# Patient Record
Sex: Male | Born: 1940 | Race: White | Hispanic: No | Smoking: Former smoker
Health system: Southern US, Community
[De-identification: ages and names within clinical notes are randomized; demographics above are authoritative.]

## PROBLEM LIST (undated history)

## (undated) DIAGNOSIS — E039 Hypothyroidism, unspecified: Secondary | ICD-10-CM

## (undated) DIAGNOSIS — R5383 Other fatigue: Secondary | ICD-10-CM

## (undated) DIAGNOSIS — E785 Hyperlipidemia, unspecified: Secondary | ICD-10-CM

## (undated) DIAGNOSIS — F32A Depression, unspecified: Secondary | ICD-10-CM

## (undated) DIAGNOSIS — F329 Major depressive disorder, single episode, unspecified: Secondary | ICD-10-CM

## (undated) DIAGNOSIS — K769 Liver disease, unspecified: Secondary | ICD-10-CM

## (undated) DIAGNOSIS — I10 Essential (primary) hypertension: Principal | ICD-10-CM

## (undated) DIAGNOSIS — E079 Disorder of thyroid, unspecified: Secondary | ICD-10-CM

## (undated) DIAGNOSIS — E46 Unspecified protein-calorie malnutrition: Secondary | ICD-10-CM

## (undated) DIAGNOSIS — C8588 Other specified types of non-Hodgkin lymphoma, lymph nodes of multiple sites: Secondary | ICD-10-CM

## (undated) DIAGNOSIS — M109 Gout, unspecified: Principal | ICD-10-CM

## (undated) HISTORY — DX: Disorder of thyroid, unspecified: E07.9

## (undated) HISTORY — PX: BACK SURGERY: SHX140

## (undated) HISTORY — DX: Hyperlipidemia, unspecified: E78.5

## (undated) HISTORY — DX: Other fatigue: R53.83

## (undated) HISTORY — DX: Other specified types of non-hodgkin lymphoma, lymph nodes of multiple sites: C85.88

## (undated) HISTORY — DX: Gout, unspecified: M10.9

## (undated) HISTORY — DX: Essential (primary) hypertension: I10

## (undated) HISTORY — DX: Liver disease, unspecified: K76.9

## (undated) HISTORY — DX: Unspecified protein-calorie malnutrition: E46

---

## 2013-05-03 DEATH — deceased

## 2013-08-29 ENCOUNTER — Telehealth: Payer: Self-pay | Admitting: Hematology and Oncology

## 2013-08-29 NOTE — Telephone Encounter (Signed)
C/D 08/29/13 for appt. 08/30/13 °

## 2013-08-29 NOTE — Telephone Encounter (Signed)
S/W PATIENT DAUGHTER KATHY AND GVE NP  APPT 01/28 @ 9:30 W/DR. [place] PACKET EMAILED.

## 2013-08-30 ENCOUNTER — Ambulatory Visit (HOSPITAL_BASED_OUTPATIENT_CLINIC_OR_DEPARTMENT_OTHER): Payer: Medicare PPO

## 2013-08-30 ENCOUNTER — Encounter: Payer: Self-pay | Admitting: Hematology and Oncology

## 2013-08-30 ENCOUNTER — Ambulatory Visit: Payer: Medicare PPO

## 2013-08-30 ENCOUNTER — Telehealth: Payer: Self-pay | Admitting: Hematology and Oncology

## 2013-08-30 ENCOUNTER — Ambulatory Visit (HOSPITAL_BASED_OUTPATIENT_CLINIC_OR_DEPARTMENT_OTHER): Payer: Medicare PPO | Admitting: Hematology and Oncology

## 2013-08-30 ENCOUNTER — Telehealth: Payer: Self-pay | Admitting: *Deleted

## 2013-08-30 VITALS — BP 131/55 | HR 87 | Temp 97.5°F | Resp 18 | Ht 75.0 in | Wt 175.7 lb

## 2013-08-30 DIAGNOSIS — D649 Anemia, unspecified: Secondary | ICD-10-CM

## 2013-08-30 DIAGNOSIS — C8588 Other specified types of non-Hodgkin lymphoma, lymph nodes of multiple sites: Secondary | ICD-10-CM

## 2013-08-30 DIAGNOSIS — R161 Splenomegaly, not elsewhere classified: Secondary | ICD-10-CM

## 2013-08-30 DIAGNOSIS — R599 Enlarged lymph nodes, unspecified: Secondary | ICD-10-CM

## 2013-08-30 DIAGNOSIS — F17200 Nicotine dependence, unspecified, uncomplicated: Secondary | ICD-10-CM

## 2013-08-30 DIAGNOSIS — E039 Hypothyroidism, unspecified: Secondary | ICD-10-CM

## 2013-08-30 DIAGNOSIS — D539 Nutritional anemia, unspecified: Secondary | ICD-10-CM

## 2013-08-30 DIAGNOSIS — F329 Major depressive disorder, single episode, unspecified: Secondary | ICD-10-CM

## 2013-08-30 DIAGNOSIS — R634 Abnormal weight loss: Secondary | ICD-10-CM

## 2013-08-30 DIAGNOSIS — R61 Generalized hyperhidrosis: Secondary | ICD-10-CM

## 2013-08-30 DIAGNOSIS — C8310 Mantle cell lymphoma, unspecified site: Secondary | ICD-10-CM | POA: Insufficient documentation

## 2013-08-30 DIAGNOSIS — F3289 Other specified depressive episodes: Secondary | ICD-10-CM

## 2013-08-30 DIAGNOSIS — E46 Unspecified protein-calorie malnutrition: Secondary | ICD-10-CM

## 2013-08-30 DIAGNOSIS — R1012 Left upper quadrant pain: Secondary | ICD-10-CM

## 2013-08-30 HISTORY — DX: Unspecified protein-calorie malnutrition: E46

## 2013-08-30 HISTORY — DX: Other specified types of non-hodgkin lymphoma, lymph nodes of multiple sites: C85.88

## 2013-08-30 LAB — CBC & DIFF AND RETIC
BASO%: 1 % (ref 0.0–2.0)
Basophils Absolute: 0.1 10*3/uL (ref 0.0–0.1)
EOS%: 2.2 % (ref 0.0–7.0)
Eosinophils Absolute: 0.2 10*3/uL (ref 0.0–0.5)
HCT: 26.6 % — ABNORMAL LOW (ref 38.4–49.9)
HGB: 8.4 g/dL — ABNORMAL LOW (ref 13.0–17.1)
Immature Retic Fract: 18 % — ABNORMAL HIGH (ref 3.00–10.60)
LYMPH%: 28.8 % (ref 14.0–49.0)
MCH: 28.9 pg (ref 27.2–33.4)
MCHC: 31.6 g/dL — AB (ref 32.0–36.0)
MCV: 91.4 fL (ref 79.3–98.0)
MONO#: 1 10*3/uL — ABNORMAL HIGH (ref 0.1–0.9)
MONO%: 10.2 % (ref 0.0–14.0)
NEUT#: 5.4 10*3/uL (ref 1.5–6.5)
NEUT%: 57.8 % (ref 39.0–75.0)
PLATELETS: 163 10*3/uL (ref 140–400)
RBC: 2.91 10*6/uL — AB (ref 4.20–5.82)
RDW: 15.9 % — ABNORMAL HIGH (ref 11.0–14.6)
RETIC %: 4.52 % — AB (ref 0.80–1.80)
Retic Ct Abs: 131.53 10*3/uL — ABNORMAL HIGH (ref 34.80–93.90)
WBC: 9.4 10*3/uL (ref 4.0–10.3)
lymph#: 2.7 10*3/uL (ref 0.9–3.3)

## 2013-08-30 LAB — COMPREHENSIVE METABOLIC PANEL (CC13)
ALT: 16 U/L (ref 0–55)
AST: 25 U/L (ref 5–34)
Albumin: 3.2 g/dL — ABNORMAL LOW (ref 3.5–5.0)
Alkaline Phosphatase: 120 U/L (ref 40–150)
Anion Gap: 11 mEq/L (ref 3–11)
BILIRUBIN TOTAL: 1.07 mg/dL (ref 0.20–1.20)
BUN: 18.2 mg/dL (ref 7.0–26.0)
CO2: 25 mEq/L (ref 22–29)
CREATININE: 1 mg/dL (ref 0.7–1.3)
Calcium: 9.5 mg/dL (ref 8.4–10.4)
Chloride: 104 mEq/L (ref 98–109)
GLUCOSE: 84 mg/dL (ref 70–140)
Potassium: 4.3 mEq/L (ref 3.5–5.1)
Sodium: 139 mEq/L (ref 136–145)
Total Protein: 7.4 g/dL (ref 6.4–8.3)

## 2013-08-30 LAB — HEPATITIS B CORE ANTIBODY, IGM: Hep B C IgM: NONREACTIVE

## 2013-08-30 LAB — URIC ACID (CC13): Uric Acid, Serum: 8.5 mg/dl — ABNORMAL HIGH (ref 2.6–7.4)

## 2013-08-30 LAB — IRON AND TIBC CHCC
%SAT: 16 % — ABNORMAL LOW (ref 20–55)
Iron: 43 ug/dL (ref 42–163)
TIBC: 276 ug/dL (ref 202–409)
UIBC: 233 ug/dL (ref 117–376)

## 2013-08-30 LAB — FERRITIN CHCC: Ferritin: 1321 ng/ml — ABNORMAL HIGH (ref 22–316)

## 2013-08-30 LAB — LACTATE DEHYDROGENASE (CC13): LDH: 306 U/L — AB (ref 125–245)

## 2013-08-30 LAB — HEPATITIS B SURFACE ANTIBODY,QUALITATIVE: HEP B S AB: NEGATIVE

## 2013-08-30 LAB — HEPATITIS B SURFACE ANTIGEN: Hepatitis B Surface Ag: NEGATIVE

## 2013-08-30 LAB — VITAMIN B12: Vitamin B-12: 384 pg/mL (ref 211–911)

## 2013-08-30 LAB — SEDIMENTATION RATE: SED RATE: 42 mm/h — AB (ref 0–16)

## 2013-08-30 NOTE — Progress Notes (Signed)
Checked in new pt with no financial concerns. °

## 2013-08-30 NOTE — Telephone Encounter (Signed)
Call from [facility] that soonest PET is on 2/09.   Per Dr. Alvy Bimler r/s office visit from 2/06 to 2/10 after PET can be done.  POF sent.   Called home and cell for pt's daughter to notify of office visit to be r/s to 2/10.  No answer or identifying voice mail available.

## 2013-08-30 NOTE — Progress Notes (Signed)
[facility] CONSULT NOTE  Patient has no care team.  CHIEF COMPLAINTS/PURPOSE OF CONSULTATION:  Diffuse lymphadenopathy and splenomegaly, suspect lymphoma  HISTORY OF PRESENTING ILLNESS:  Manuel [name] 73 y.o. male is here because of abnormal weight loss, diffuse itching, daily night sweats, diffuse lymphadenopathy with suspicious for lymphoma He is accompanied by his daughter today. The patient was recently widowed in 2013/05/02. His wife died of cancer. His family thought he has significant grieving process as the patient appeared to be depressed and sleeps a lot. He has noticeable weight loss. He estimated about 25 pounds weight loss due to anorexia. The patient has been having daily night sweats for the last 3 months. He also has a palpable mass in the left upper quadrant which caused occasional pain when he sleeps on his side. He also have diffuse leg edema for many months and was subsequently found to have severe hypothyroidism. Since he was started on thyroid replacement therapy, his edema has improved. Over the last 1 month, he has extensive evaluation done by his primary care provider in [place]. On 08/17/2013, he had CT scan of the abdomen and pelvis which show a large mass in the left upper quadrant likely due to splenomegaly measured 17.5 cm with surrounding intra-abdominal lymphadenopathy and bilateral iliac lymphadenopathy.  The patient was also hospitalized briefly in [place] due to acute renal failure from severe dehydration. His creatinine was 1.7 on 08/17/2013, albumin low at 2.7, white count is elevated at 12.5 and anemia with hemoglobin of 8.3. His baseline creatinine from November 2014 within normal limits at 1.1 with an albumin of 4.1. With findings of anemia, he was placed on iron sulfate two times a day since January 2015.  With the presence of lymphadenopathy and splenomegaly, the principal differential diagnosis could be due to lymphoma. Due  to poor social support in [place] and progressive decline, his family members moved him to [place] for further evaluation and treatment.  For his anemia, he never had screening colonoscopy. He has intermittent hemorrhoidal bleeding recently due to  mild constipation MEDICAL HISTORY:  Past Medical History  Diagnosis Date  . Thyroid disease   . Hyperlipemia   . Liver disease   . 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 Malnutrition 08/30/2013    SURGICAL HISTORY: Past Surgical History  Procedure Laterality Date  . Back surgery      SOCIAL HISTORY: History   Social History  . Marital Status: Widowed    Spouse Name: N/A    Number of Children: N/A  . Years of Education: N/A   Occupational History  . Not on file.   Social History Main Topics  . Smoking status: Current Every Day Smoker -- 1.00 packs/day for 52 years    Types: Cigarettes  . Smokeless tobacco: Never Used  . Alcohol Use: No  . Drug Use: No  . Sexual Activity: Not on file   Other Topics Concern  . Not on file   Social History Narrative  . No narrative on file    FAMILY HISTORY: Family History  Problem Relation Age of Onset  . Cancer Mother     breast ca  . Cancer Sister     NHL  . Cancer Brother     ?colon can  . Cancer Brother     lung ca    ALLERGIES:  has No Known Allergies.  MEDICATIONS:  Current Outpatient Prescriptions  Medication Sig Dispense Refill  .  ferrous sulfate 325 (65 FE) MG tablet Take 325 mg by mouth 2 (two) times daily.      Marland Kitchen levothyroxine (SYNTHROID, LEVOTHROID) 75 MCG tablet Take 75 mcg by mouth daily.      Marland Kitchen oxyCODONE (OXY IR/ROXICODONE) 5 MG immediate release tablet Take 5 mg by mouth every 6 (six) hours as needed.      . pravastatin (PRAVACHOL) 20 MG tablet Take 20 mg by mouth daily.       No current facility-administered medications for this visit.    REVIEW OF SYSTEMS:   Eyes: Denies blurriness of vision, double vision  or watery eyes Ears, nose, mouth, throat, and face: Denies mucositis or sore throat Respiratory: Denies cough, dyspnea or wheezes Cardiovascular: Denies palpitation, chest discomfort. He has chronic bilateral lower extremity swelling Gastrointestinal:  Denies nausea, heartburn or change in bowel habits Skin: Denies abnormal skin rashes Neurological:Denies numbness, tingling or new weaknesses Behavioral/Psych: Mood is stable, no new changes  All other systems were reviewed with the patient and are negative.  PHYSICAL EXAMINATION: ECOG PERFORMANCE STATUS: 2 - Symptomatic, <50% confined to bed  Filed Vitals:   08/30/13 0955  BP: 131/55  Pulse: 87  Temp: 97.5 F (36.4 C)  Resp: 18   Filed Weights   08/30/13 0955  Weight: 175 lb 11.2 oz (79.697 kg)    GENERAL:alert, no distress and comfortable. He appears thin and cachectic SKIN: skin color, texture, turgor are normal, no rashes or significant lesions EYES: normal, conjunctiva are pink and non-injected, sclera clear OROPHARYNX:no exudate, no erythema and lips, buccal mucosa, and tongue normal  NECK: supple, thyroid normal size, non-tender, without nodularity LYMPH:  There is palpable lymphadenopathy in the right inguinal region and fullness in his left axilla LUNGS: clear to auscultation and percussion with normal breathing effort HEART: regular rate & rhythm and no murmurs and no lower extremity edema ABDOMEN:abdomen soft, palpable splenomegaly 3 inches below the left posterior margin which is firm on palpation. Musculoskeletal:no cyanosis of digits and no clubbing  PSYCH: alert & oriented x 3 with fluent speech NEURO: no focal motor/sensory deficits  LABORATORY DATA:  I have reviewed the data as listed Lab Results  Component Value Date   WBC 9.4 08/30/2013   HGB 8.4* 08/30/2013   HCT 26.6* 08/30/2013   MCV 91.4 08/30/2013   PLT 163 08/30/2013   Lab Results  Component Value Date   NA 139 08/30/2013   K 4.3 08/30/2013   CO2 25  08/30/2013    RADIOGRAPHIC STUDIES: A review imaging report as above  ASSESSMENT:  Diffuse lymphadenopathy and splenomegaly, along with night sweats anorexia and weight loss, is highly suspicious of lymphoma  PLAN:  #1 suspect lymphoma I will order additional blood work, inguinal lymph node biopsy, Infuse-a-Port placement as well as PET/CT scan to stage him. He will require bone marrow aspirate and biopsy as well but I would hold off until further results not available. #2 significant weight loss with depression I recommend frequent small meals. Will consider starting him on antidepressants in the future #3 chronic anemia with intermittent hemorrhoidal bleeding I recommend him to stop iron supplement. The cause of his anemia is likely due to lymphoma, anemia of chronic disease or bone marrow infiltration. I recommend he stop iron supplements. #4 history of hyperlipidemia I recommend he stopped Pravachol due to severe weight loss and history of liver disease from prior Tylenol poisoning #5 left upper quadrant pain I recommend he take oxycodone as needed  #6 hypothyroidism I  recommend he continue his thyroid supplement #7 diffuse itching This is likely due to undiagnosed malignancy. I recommend over-the-counter medicine such as Benadryl #8 severe malnutrition I recommend nutrition consult  Orders Placed This Encounter  Procedures  . NM PET Image Initial (PI) Skull Base To Thigh    Standing Status: Future     Number of Occurrences:      Standing Expiration Date: 10/30/2014    Order Specific Question:  Reason for Exam (SYMPTOM  OR DIAGNOSIS REQUIRED)    Answer:  staging lymphoma    Order Specific Question:  Preferred imaging location?    Answer:  [facility]  . CBC & Diff and Retic    Standing Status: Future     Number of Occurrences: 1     Standing Expiration Date: 08/30/2014  . Comprehensive metabolic panel    Standing Status: Future     Number of Occurrences: 1      Standing Expiration Date: 08/30/2014  . Lactate dehydrogenase    Standing Status: Future     Number of Occurrences: 1     Standing Expiration Date: 08/30/2014  . Uric Acid    Standing Status: Future     Number of Occurrences: 1     Standing Expiration Date: 08/30/2014  . Hepatitis B surface antibody    Standing Status: Future     Number of Occurrences: 1     Standing Expiration Date: 08/30/2014  . Hepatitis B core antibody, IgM    Standing Status: Future     Number of Occurrences: 1     Standing Expiration Date: 08/30/2014  . Hepatitis B surface antigen    Standing Status: Future     Number of Occurrences: 1     Standing Expiration Date: 08/30/2014  . Ferritin    Standing Status: Future     Number of Occurrences: 1     Standing Expiration Date: 08/30/2014  . Iron and TIBC    Standing Status: Future     Number of Occurrences: 1     Standing Expiration Date: 08/30/2014  . Vitamin B12    Standing Status: Future     Number of Occurrences: 1     Standing Expiration Date: 08/30/2014  . Sedimentation rate    Standing Status: Future     Number of Occurrences: 1     Standing Expiration Date: 08/30/2014  . Amb ref to Medical Nutrition Therapy-MNT    Referral Priority:  Routine    Referral Type:  Consultation    Referral Reason:  Specialty Services Required    Requested Specialty:  Nutrition    Number of Visits Requested:  1  . Ambulatory referral to General Surgery    Referral Priority:  Routine    Referral Type:  Surgical    Referral Reason:  Specialty Services Required    Requested Specialty:  General Surgery    Number of Visits Requested:  1    All questions were answered. The patient knows to call the clinic with any problems, questions or concerns.    [facility], [place], MD 08/30/2013 3:08 PM

## 2013-08-30 NOTE — Telephone Encounter (Signed)
gv and printedj appt sched and avs forpt for Feb...Marland Kitchenpt sched to See Dr. Brantley Stage on 2.6.15 @ 11am....pt changed mind about see dietition and will r/s another time

## 2013-09-01 ENCOUNTER — Telehealth: Payer: Self-pay | Admitting: Hematology and Oncology

## 2013-09-01 NOTE — Telephone Encounter (Signed)
s.w. pt and advised on all Feb appt....pt ok and aware

## 2013-09-05 ENCOUNTER — Encounter: Payer: Medicare PPO | Admitting: Nutrition

## 2013-09-08 ENCOUNTER — Telehealth: Payer: Self-pay | Admitting: Hematology and Oncology

## 2013-09-08 ENCOUNTER — Ambulatory Visit (INDEPENDENT_AMBULATORY_CARE_PROVIDER_SITE_OTHER): Payer: Medicare PPO | Admitting: Surgery

## 2013-09-08 ENCOUNTER — Telehealth: Payer: Self-pay | Admitting: *Deleted

## 2013-09-08 ENCOUNTER — Encounter (INDEPENDENT_AMBULATORY_CARE_PROVIDER_SITE_OTHER): Payer: Self-pay | Admitting: Surgery

## 2013-09-08 ENCOUNTER — Ambulatory Visit: Payer: Medicare PPO | Admitting: Hematology and Oncology

## 2013-09-08 ENCOUNTER — Encounter: Payer: Medicare PPO | Admitting: Nutrition

## 2013-09-08 ENCOUNTER — Encounter (HOSPITAL_BASED_OUTPATIENT_CLINIC_OR_DEPARTMENT_OTHER): Payer: Self-pay | Admitting: *Deleted

## 2013-09-08 ENCOUNTER — Other Ambulatory Visit: Payer: Self-pay | Admitting: *Deleted

## 2013-09-08 VITALS — BP 124/68 | HR 80 | Resp 20 | Ht 75.0 in | Wt 175.8 lb

## 2013-09-08 DIAGNOSIS — R599 Enlarged lymph nodes, unspecified: Secondary | ICD-10-CM

## 2013-09-08 DIAGNOSIS — R591 Generalized enlarged lymph nodes: Secondary | ICD-10-CM | POA: Insufficient documentation

## 2013-09-08 NOTE — Telephone Encounter (Signed)
Pt's Biopsy scheduled for 2/10.  Dr. Alvy Bimler instructs to r/s office visit on 2/10 to 2/13.  Called Daughter and she requests time later in day.  Dr. Alvy Bimler can see pt on 2/16 at 3:15 pm.  Daughter agreed to this time.  Discussed at length Dr. Alvy Bimler recommends pt have Port placed at same time as biopsy,  But he has declined to have port done until after biopsy.  Dau states pt concerned about possible complications w/ Portacath placement and he also has experience of wife having chemo last year w/o a port.   Explained to dau recommendation for port as type of chemo pt will likely need involves vesicants.  Explained it will delay treatment to wait for port placement.  She verbalizes understanding and will discuss further with her father.  She confirms appt change w/ Dr. Alvy Bimler to 2/16.  POF sent.

## 2013-09-08 NOTE — Progress Notes (Signed)
Patient ID: Manuel [name], male   DOB: [birth year], 73 y.o.   MRN: [id number]  No chief complaint on file.   HPI Manuel [name] is a 73 y.o. male.  Pt sent at request of Dr Manuel [name] 4 lymphadenopathy, weight loss and night sweats for 2 months. Patient was living in [place]. Patient developed weight loss, fatigue, dehydration and required admission to hospital there. CT scan showed diffuse intra-abdominal lymphadenopathy, splenomegaly. Patient had acute renal insufficiency do to poor intake. This was corrected. He was told to seek oncology care [facility] or daughter lives. He was seen by medical oncology and felt to have a high-risk for lymphoma given diffuse lymphadenopathy and splenomegaly. CT scan was done [facility] which I do not have access to currently. He relates a 25 pound weight loss to me, severe night sweats and fatigue. Patient is here to discuss lymph node biopsy and Port-A-Cath placement. No tissue diagnosis at this point in time. HPI  Past Medical History  Diagnosis Date  . Thyroid disease   . Hyperlipemia   . Liver disease   . 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 Malnutrition 08/30/2013    Past Surgical History  Procedure Laterality Date  . Back surgery      Family History  Problem Relation Age of Onset  . Cancer Mother     breast ca  . Cancer Sister     NHL  . Cancer Brother     ?colon can  . Cancer Brother     lung ca    Social History History  Substance Use Topics  . Smoking status: Current Every Day Smoker -- 1.00 packs/day for 52 years    Types: Cigarettes  . Smokeless tobacco: Never Used  . Alcohol Use: No    No Known Allergies  Current Outpatient Prescriptions  Medication Sig Dispense Refill  . Cholecalciferol (VITAMIN D PO) Take by mouth.      . levothyroxine (SYNTHROID, LEVOTHROID) 75 MCG tablet Take 75 mcg by mouth daily.      Marland Kitchen oxyCODONE (OXY IR/ROXICODONE) 5 MG immediate release tablet Take 5  mg by mouth every 6 (six) hours as needed.       No current facility-administered medications for this visit.    Review of Systems Review of Systems  Constitutional: Positive for fever, appetite change, fatigue and unexpected weight change.  HENT: Negative.   Eyes: Negative.   Respiratory: Positive for cough.   Cardiovascular: Negative.   Gastrointestinal: Negative.   Endocrine: Negative.   Genitourinary: Negative.   Allergic/Immunologic: Negative.   Neurological: Positive for weakness.  Hematological: Positive for adenopathy. Bruises/bleeds easily.  Psychiatric/Behavioral: Negative.     Blood pressure 124/68, pulse 80, resp. rate 20, height 6\' 3"  (1.905 m), weight 175 lb 12.8 oz (79.742 kg).  Physical Exam Physical Exam  Constitutional: He is oriented to person, place, and time. He appears listless. He appears cachectic.  Non-toxic appearance. He has a sickly appearance. No distress.  Eyes: Pupils are equal, round, and reactive to light. No scleral icterus.  Neck: Normal range of motion. Neck [name].  Cardiovascular: Normal rate and regular rhythm.   Pulmonary/Chest: Effort normal and breath sounds normal. He has no rales.  Abdominal: Soft. There is splenomegaly. There is no tenderness.    Musculoskeletal: Normal range of motion.  Lymphadenopathy:       Head (right side): No submental, no submandibular, no tonsillar, no preauricular, no posterior auricular and no occipital adenopathy present.  Head (left side): No submental, no submandibular, no tonsillar, no preauricular, no posterior auricular and no occipital adenopathy present.    He has no cervical adenopathy.    He has axillary adenopathy.       Right axillary: Lateral adenopathy present. No pectoral adenopathy present.       Left axillary: No pectoral and no lateral adenopathy present.      Right: Inguinal adenopathy present.       Left: No inguinal adenopathy present.  Neurological: He is oriented to person,  place, and time. He appears listless.  Skin: Skin is warm and dry.  Psychiatric: He has a normal mood and affect. His behavior is normal. Judgment and thought content normal.    Data Reviewed Oncology notes  Assessment    Diffuse lymphadenopathy with splenomegaly, night sweats and 25 pound weight loss in last 8 weeks worrisome for lymphoma  Tobacco abuse  Malnutrition    Plan    Patient is in need of lymph node biopsy. He has multiple lymph node basins that are abnormal by exam. The groin lymphadenopathy is along the iliac vessels. He has significant right axillary lymphadenopathy as well which would be less risky to biopsy. We'll set up for right axillary lymph node biopsy since the space and is abnormal as well. Discussed the necessity for possible Port-A-Cath placement. Patient and daughter are very uncomfortable proceeding with Port-A-Cath placement without tissue diagnosis. Patient and daughter uncomfortable about Port-A-Cath placement overall after discussion of how the catheter is placed, possible risks and complications of placement and long term expectations. Discussed other options of chemotherapy access which include PICC line and peripheral stick. I explained these are not the best options for him. Port-A-Cath placement would be best and simplest. Patient and daughter expressed concern about Port-A-Cath placement. Daughter states she will talk with oncologist about this at her next visit. Risk of insertion of Port-A-Cath include bleeding, infection, pneumothorax, hemothorax, pericardial tamponadet, mediastinal injury, catheter migration, catheter fragmentation, catheter thrombosis, death, and the need for other operative interventions. These risks are all very low which they're well aware of. We'll proceed with lymph node biopsy for now. PET scan scheduled for Monday. Risk of lymph node biopsy include bleeding, infection, blood vessel injury, nerve injury, persistent lymphocele, and  the need for other operative interventions.       Jamesina Gaugh A. 09/08/2013, 12:27 PM    

## 2013-09-08 NOTE — Telephone Encounter (Signed)
s.w. pt wife and advised on cx appt r/s to 2.16.Marland KitchenMarland Kitchenpt already aware

## 2013-09-08 NOTE — Patient Instructions (Signed)
Lymphadenopathy °Lymphadenopathy means "disease of the lymph glands." But the term is usually used to describe swollen or enlarged lymph glands, also called lymph nodes. These are the bean-shaped organs found in many locations including the neck, underarm, and groin. Lymph glands are part of the immune system, which fights infections in your body. Lymphadenopathy can occur in just one area of the body, such as the neck, or it can be generalized, with lymph node enlargement in several areas. The nodes found in the neck are the most common sites of lymphadenopathy. °CAUSES  °When your immune system responds to germs (such as viruses or bacteria ), infection-fighting cells and fluid build up. This causes the glands to grow in size. This is usually not something to worry about. Sometimes, the glands themselves can become infected and inflamed. This is called lymphadenitis. °Enlarged lymph nodes can be caused by many diseases: °· Bacterial disease, such as strep throat or a skin infection. °· Viral disease, such as a common cold. °· Other germs, such as lyme disease, tuberculosis, or sexually transmitted diseases. °· Cancers, such as lymphoma (cancer of the lymphatic system) or leukemia (cancer of the white blood cells). °· Inflammatory diseases such as lupus or rheumatoid arthritis. °· Reactions to medications. °Many of the diseases above are rare, but important. This is why you should see your caregiver if you have lymphadenopathy. °SYMPTOMS  °· Swollen, enlarged lumps in the neck, back of the head or other locations. °· Tenderness. °· Warmth or redness of the skin over the lymph nodes. °· Fever. °DIAGNOSIS  °Enlarged lymph nodes are often near the source of infection. They can help healthcare providers diagnose your illness. For instance:  °· Swollen lymph nodes around the jaw might be caused by an infection in the mouth. °· Enlarged glands in the neck often signal a throat infection. °· Lymph nodes that are swollen  in more than one area often indicate an illness caused by a virus. °Your caregiver most likely will know what is causing your lymphadenopathy after listening to your history and examining you. Blood tests, x-rays or other tests may be needed. If the cause of the enlarged lymph node cannot be found, and it does not go away by itself, then a biopsy may be needed. Your caregiver will discuss this with you. °TREATMENT  °Treatment for your enlarged lymph nodes will depend on the cause. Many times the nodes will shrink to normal size by themselves, with no treatment. Antibiotics or other medicines may be needed for infection. Only take over-the-counter or prescription medicines for pain, discomfort or fever as directed by your caregiver. °HOME CARE INSTRUCTIONS  °Swollen lymph glands usually return to normal when the underlying medical condition goes away. If they persist, contact your health-care provider. He/she might prescribe antibiotics or other treatments, depending on the diagnosis. Take any medications exactly as prescribed. Keep any follow-up appointments made to check on the condition of your enlarged nodes.  °SEEK MEDICAL CARE IF:  °· Swelling lasts for more than two weeks. °· You have symptoms such as weight loss, night sweats, fatigue or fever that does not go away. °· The lymph nodes are hard, seem fixed to the skin or are growing rapidly. °· Skin over the lymph nodes is red and inflamed. This could mean there is an infection. °SEEK IMMEDIATE MEDICAL CARE IF:  °· Fluid starts leaking from the area of the enlarged lymph node. °· You develop a fever of 102° F (38.9° C) or greater. °· Severe   pain develops (not necessarily at the site of a large lymph node). °· You develop chest pain or shortness of breath. °· You develop worsening abdominal pain. °MAKE SURE YOU:  °· Understand these instructions. °· Will watch your condition. °· Will get help right away if you are not doing well or get worse. °Document  Released: 04/28/2008 Document Revised: 10/12/2011 Document Reviewed: 04/28/2008 °[employer]® Patient Information ©2014 [employer]. ° °

## 2013-09-11 ENCOUNTER — Encounter (HOSPITAL_COMMUNITY)
Admission: RE | Admit: 2013-09-11 | Discharge: 2013-09-11 | Disposition: A | Discharge status: Home / Self Care | Payer: Medicare PPO | Source: Ambulatory Visit | Attending: Hematology and Oncology | Admitting: Hematology and Oncology

## 2013-09-11 DIAGNOSIS — C8588 Other specified types of non-Hodgkin lymphoma, lymph nodes of multiple sites: Secondary | ICD-10-CM

## 2013-09-11 NOTE — Progress Notes (Signed)
Notified dr corrnett hgb 08/30/13 was 8.4

## 2013-09-12 ENCOUNTER — Encounter (HOSPITAL_BASED_OUTPATIENT_CLINIC_OR_DEPARTMENT_OTHER)
Admission: RE | Disposition: A | Discharge status: Home / Self Care | Payer: Self-pay | Source: Ambulatory Visit | Attending: Surgery

## 2013-09-12 ENCOUNTER — Ambulatory Visit: Payer: Medicare PPO | Admitting: Hematology and Oncology

## 2013-09-12 ENCOUNTER — Encounter: Payer: Medicare PPO | Admitting: Nutrition

## 2013-09-12 ENCOUNTER — Ambulatory Visit (HOSPITAL_COMMUNITY): Payer: Medicare PPO

## 2013-09-12 ENCOUNTER — Encounter (HOSPITAL_BASED_OUTPATIENT_CLINIC_OR_DEPARTMENT_OTHER): Payer: Medicare PPO | Admitting: Anesthesiology

## 2013-09-12 ENCOUNTER — Encounter (HOSPITAL_BASED_OUTPATIENT_CLINIC_OR_DEPARTMENT_OTHER): Payer: Self-pay | Admitting: *Deleted

## 2013-09-12 ENCOUNTER — Ambulatory Visit (HOSPITAL_BASED_OUTPATIENT_CLINIC_OR_DEPARTMENT_OTHER)
Admission: RE | Admit: 2013-09-12 | Discharge: 2013-09-12 | Disposition: A | Discharge status: Home / Self Care | Payer: Medicare PPO | Source: Ambulatory Visit | Attending: Surgery | Admitting: Surgery

## 2013-09-12 ENCOUNTER — Ambulatory Visit (HOSPITAL_BASED_OUTPATIENT_CLINIC_OR_DEPARTMENT_OTHER): Payer: Medicare PPO | Admitting: Anesthesiology

## 2013-09-12 DIAGNOSIS — R599 Enlarged lymph nodes, unspecified: Secondary | ICD-10-CM

## 2013-09-12 DIAGNOSIS — C8584 Other specified types of non-Hodgkin lymphoma, lymph nodes of axilla and upper limb: Secondary | ICD-10-CM | POA: Insufficient documentation

## 2013-09-12 DIAGNOSIS — R591 Generalized enlarged lymph nodes: Secondary | ICD-10-CM

## 2013-09-12 DIAGNOSIS — F17200 Nicotine dependence, unspecified, uncomplicated: Secondary | ICD-10-CM | POA: Insufficient documentation

## 2013-09-12 DIAGNOSIS — R634 Abnormal weight loss: Secondary | ICD-10-CM | POA: Insufficient documentation

## 2013-09-12 DIAGNOSIS — R161 Splenomegaly, not elsewhere classified: Secondary | ICD-10-CM | POA: Insufficient documentation

## 2013-09-12 DIAGNOSIS — R61 Generalized hyperhidrosis: Secondary | ICD-10-CM | POA: Insufficient documentation

## 2013-09-12 HISTORY — PX: PORTACATH PLACEMENT: SHX2246

## 2013-09-12 HISTORY — PX: AXILLARY LYMPH NODE BIOPSY: SHX5737

## 2013-09-12 HISTORY — DX: Hypothyroidism, unspecified: E03.9

## 2013-09-12 HISTORY — DX: Depression, unspecified: F32.A

## 2013-09-12 HISTORY — DX: Major depressive disorder, single episode, unspecified: F32.9

## 2013-09-12 LAB — POCT HEMOGLOBIN-HEMACUE: Hemoglobin: 10 g/dL — ABNORMAL LOW (ref 13.0–17.0)

## 2013-09-12 SURGERY — AXILLARY LYMPH NODE BIOPSY
Anesthesia: General | Site: Chest | Laterality: Right

## 2013-09-12 MED ORDER — DEXAMETHASONE SODIUM PHOSPHATE 4 MG/ML IJ SOLN
INTRAMUSCULAR | Status: DC | PRN
  Administered 2013-09-12: 10 mg via INTRAVENOUS

## 2013-09-12 MED ORDER — LACTATED RINGERS IV SOLN
INTRAVENOUS | Status: DC
  Administered 2013-09-12 (×2): via INTRAVENOUS

## 2013-09-12 MED ORDER — CHLORHEXIDINE GLUCONATE 4 % EX LIQD: 1.0000 "application " | Freq: Once | CUTANEOUS | Status: DC

## 2013-09-12 MED ORDER — EPHEDRINE SULFATE 50 MG/ML IJ SOLN
INTRAMUSCULAR | Status: DC | PRN
  Administered 2013-09-12: 10 mg via INTRAVENOUS

## 2013-09-12 MED ORDER — FENTANYL CITRATE 0.05 MG/ML IJ SOLN: 50.0000 ug | INTRAMUSCULAR | Status: DC | PRN

## 2013-09-12 MED ORDER — HYDROMORPHONE HCL PF 1 MG/ML IJ SOLN: 0.2500 mg | INTRAMUSCULAR | Status: DC | PRN

## 2013-09-12 MED ORDER — MIDAZOLAM HCL 2 MG/2ML IJ SOLN: 1.0000 mg | INTRAMUSCULAR | Status: DC | PRN

## 2013-09-12 MED ORDER — BUPIVACAINE-EPINEPHRINE PF 0.25-1:200000 % IJ SOLN
INTRAMUSCULAR | Status: DC | PRN
  Administered 2013-09-12: 30 mL

## 2013-09-12 MED ORDER — METHYLENE BLUE 1 % INJ SOLN
INTRAMUSCULAR | Status: AC
  Filled 2013-09-12: qty 10

## 2013-09-12 MED ORDER — HEPARIN SOD (PORK) LOCK FLUSH 100 UNIT/ML IV SOLN
INTRAVENOUS | Status: AC
  Filled 2013-09-12: qty 5

## 2013-09-12 MED ORDER — HEPARIN (PORCINE) IN NACL 2-0.9 UNIT/ML-% IJ SOLN
INTRAMUSCULAR | Status: DC | PRN
  Administered 2013-09-12: 500 mL

## 2013-09-12 MED ORDER — MIDAZOLAM HCL 5 MG/5ML IJ SOLN
INTRAMUSCULAR | Status: DC | PRN
  Administered 2013-09-12: 2 mg via INTRAVENOUS

## 2013-09-12 MED ORDER — PROPOFOL 10 MG/ML IV BOLUS
INTRAVENOUS | Status: DC | PRN
  Administered 2013-09-12: 200 mg via INTRAVENOUS

## 2013-09-12 MED ORDER — BUPIVACAINE-EPINEPHRINE PF 0.25-1:200000 % IJ SOLN
INTRAMUSCULAR | Status: AC
  Filled 2013-09-12: qty 30

## 2013-09-12 MED ORDER — OXYCODONE HCL 5 MG/5ML PO SOLN: 5.0000 mg | Freq: Once | ORAL | Status: DC | PRN

## 2013-09-12 MED ORDER — FENTANYL CITRATE 0.05 MG/ML IJ SOLN
INTRAMUSCULAR | Status: DC | PRN
  Administered 2013-09-12: 100 ug via INTRAVENOUS
  Administered 2013-09-12: 50 ug via INTRAVENOUS

## 2013-09-12 MED ORDER — CEFAZOLIN SODIUM-DEXTROSE 2-3 GM-% IV SOLR
INTRAVENOUS | Status: DC | PRN
  Administered 2013-09-12: 2 g via INTRAVENOUS

## 2013-09-12 MED ORDER — FENTANYL CITRATE 0.05 MG/ML IJ SOLN
INTRAMUSCULAR | Status: AC
  Filled 2013-09-12: qty 4

## 2013-09-12 MED ORDER — LIDOCAINE HCL (CARDIAC) 20 MG/ML IV SOLN
INTRAVENOUS | Status: DC | PRN
  Administered 2013-09-12: 100 mg via INTRAVENOUS

## 2013-09-12 MED ORDER — ONDANSETRON HCL 4 MG/2ML IJ SOLN: 4.0000 mg | Freq: Once | INTRAMUSCULAR | Status: DC | PRN

## 2013-09-12 MED ORDER — OXYCODONE-ACETAMINOPHEN 5-325 MG PO TABS: 1.0000 | ORAL_TABLET | ORAL | Status: DC | PRN

## 2013-09-12 MED ORDER — 0.9 % SODIUM CHLORIDE (POUR BTL) OPTIME
TOPICAL | Status: DC | PRN
Start: 2013-09-12 — End: 2013-09-12
  Administered 2013-09-12: 1000 mL

## 2013-09-12 MED ORDER — HEPARIN (PORCINE) IN NACL 2-0.9 UNIT/ML-% IJ SOLN
INTRAMUSCULAR | Status: AC
  Filled 2013-09-12: qty 500

## 2013-09-12 MED ORDER — ONDANSETRON HCL 4 MG/2ML IJ SOLN
INTRAMUSCULAR | Status: DC | PRN
  Administered 2013-09-12: 4 mg via INTRAVENOUS

## 2013-09-12 MED ORDER — HEPARIN SOD (PORK) LOCK FLUSH 100 UNIT/ML IV SOLN
INTRAVENOUS | Status: DC | PRN
  Administered 2013-09-12: 500 [IU]

## 2013-09-12 MED ORDER — MIDAZOLAM HCL 2 MG/2ML IJ SOLN
INTRAMUSCULAR | Status: AC
  Filled 2013-09-12: qty 2

## 2013-09-12 MED ORDER — OXYCODONE HCL 5 MG PO TABS: 5.0000 mg | ORAL_TABLET | Freq: Once | ORAL | Status: DC | PRN

## 2013-09-12 SURGICAL SUPPLY — 78 items
APPLIER CLIP 9.375 MED OPEN (MISCELLANEOUS)
BAG DECANTER FOR FLEXI CONT (MISCELLANEOUS) ×4 IMPLANT
BENZOIN TINCTURE PRP APPL 2/3 (GAUZE/BANDAGES/DRESSINGS) IMPLANT
BLADE SURG 10 STRL SS (BLADE) ×4 IMPLANT
BLADE SURG 11 STRL SS (BLADE) ×4 IMPLANT
BLADE SURG 15 STRL LF DISP TIS (BLADE) ×2 IMPLANT
BLADE SURG 15 STRL SS (BLADE) ×2
BLADE SURG ROTATE 9660 (MISCELLANEOUS) ×4 IMPLANT
CANISTER SUCT 1200ML W/VALVE (MISCELLANEOUS) ×4 IMPLANT
CHLORAPREP W/TINT 26ML (MISCELLANEOUS) ×4 IMPLANT
CLEANER CAUTERY TIP 5X5 PAD (MISCELLANEOUS) ×2 IMPLANT
CLIP APPLIE 9.375 MED OPEN (MISCELLANEOUS) IMPLANT
CLOSURE WOUND 1/2 X4 (GAUZE/BANDAGES/DRESSINGS)
COVER MAYO STAND STRL (DRAPES) ×4 IMPLANT
COVER TABLE BACK 60X90 (DRAPES) ×4 IMPLANT
DECANTER SPIKE VIAL GLASS SM (MISCELLANEOUS) IMPLANT
DERMABOND ADVANCED (GAUZE/BANDAGES/DRESSINGS) ×4
DERMABOND ADVANCED .7 DNX12 (GAUZE/BANDAGES/DRESSINGS) ×4 IMPLANT
DRAIN CHANNEL 19F RND (DRAIN) IMPLANT
DRAIN HEMOVAC 1/8 X 5 (WOUND CARE) IMPLANT
DRAPE C-ARM 42X72 X-RAY (DRAPES) ×4 IMPLANT
DRAPE LAPAROSCOPIC ABDOMINAL (DRAPES) ×4 IMPLANT
DRAPE UTILITY XL STRL (DRAPES) ×4 IMPLANT
DRSG TEGADERM 2-3/8X2-3/4 SM (GAUZE/BANDAGES/DRESSINGS) IMPLANT
ELECT COATED BLADE 2.86 ST (ELECTRODE) ×4 IMPLANT
ELECT REM PT RETURN 9FT ADLT (ELECTROSURGICAL) ×4
ELECTRODE REM PT RTRN 9FT ADLT (ELECTROSURGICAL) ×2 IMPLANT
EVACUATOR SILICONE 100CC (DRAIN) IMPLANT
GLOVE BIOGEL PI IND STRL 6.5 (GLOVE) ×2 IMPLANT
GLOVE BIOGEL PI IND STRL 8 (GLOVE) ×2 IMPLANT
GLOVE BIOGEL PI INDICATOR 6.5 (GLOVE) ×2
GLOVE BIOGEL PI INDICATOR 8 (GLOVE) ×2
GLOVE ECLIPSE 8.0 STRL XLNG CF (GLOVE) ×4 IMPLANT
GLOVE SURG SS PI 6.5 STRL IVOR (GLOVE) ×4 IMPLANT
GOWN STRL REUS W/ TWL LRG LVL3 (GOWN DISPOSABLE) ×2 IMPLANT
GOWN STRL REUS W/ TWL XL LVL3 (GOWN DISPOSABLE) ×2 IMPLANT
GOWN STRL REUS W/TWL LRG LVL3 (GOWN DISPOSABLE) ×2
GOWN STRL REUS W/TWL XL LVL3 (GOWN DISPOSABLE) ×2
HEMOSTAT SURGICEL 2X14 (HEMOSTASIS) ×8 IMPLANT
IV KIT MINILOC 20X1 SAFETY (NEEDLE) IMPLANT
KIT PORT POWER 8FR ISP CVUE (Catheter) ×4 IMPLANT
NDL SAFETY ECLIPSE 18X1.5 (NEEDLE) IMPLANT
NEEDLE HYPO 18GX1.5 SHARP (NEEDLE)
NEEDLE HYPO 22GX1.5 SAFETY (NEEDLE) IMPLANT
NEEDLE HYPO 25X1 1.5 SAFETY (NEEDLE) ×4 IMPLANT
NEEDLE SPNL 22GX3.5 QUINCKE BK (NEEDLE) IMPLANT
NS IRRIG 1000ML POUR BTL (IV SOLUTION) ×4 IMPLANT
PACK BASIN DAY SURGERY FS (CUSTOM PROCEDURE TRAY) ×4 IMPLANT
PAD CLEANER CAUTERY TIP 5X5 (MISCELLANEOUS) ×2
PENCIL BUTTON HOLSTER BLD 10FT (ELECTRODE) ×4 IMPLANT
PIN SAFETY STERILE (MISCELLANEOUS) IMPLANT
SET SHEATH INTRODUCER 10FR (MISCELLANEOUS) IMPLANT
SHEATH COOK PEEL AWAY SET 9F (SHEATH) IMPLANT
SLEEVE SCD COMPRESS KNEE MED (MISCELLANEOUS) ×4 IMPLANT
SPONGE GAUZE 4X4 12PLY STER LF (GAUZE/BANDAGES/DRESSINGS) IMPLANT
SPONGE LAP 18X18 X RAY DECT (DISPOSABLE) IMPLANT
SPONGE LAP 4X18 X RAY DECT (DISPOSABLE) ×4 IMPLANT
STRIP CLOSURE SKIN 1/2X4 (GAUZE/BANDAGES/DRESSINGS) IMPLANT
SUT ETHILON 3 0 PS 1 (SUTURE) IMPLANT
SUT MNCRL AB 3-0 PS2 18 (SUTURE) ×4 IMPLANT
SUT MON AB 4-0 PC3 18 (SUTURE) ×4 IMPLANT
SUT PROLENE 2 0 CT2 30 (SUTURE) IMPLANT
SUT PROLENE 2 0 SH DA (SUTURE) ×4 IMPLANT
SUT SILK 2 0 SH (SUTURE) IMPLANT
SUT SILK 2 0 TIES 17X18 (SUTURE) ×2
SUT SILK 2-0 18XBRD TIE BLK (SUTURE) ×2 IMPLANT
SUT VIC AB 3-0 SH 27 (SUTURE) ×2
SUT VIC AB 3-0 SH 27X BRD (SUTURE) ×2 IMPLANT
SUT VICRYL 3-0 CR8 SH (SUTURE) ×4 IMPLANT
SYR 20CC LL (SYRINGE) ×4 IMPLANT
SYR 5ML LUER SLIP (SYRINGE) ×4 IMPLANT
SYR BULB 3OZ (MISCELLANEOUS) ×4 IMPLANT
SYR CONTROL 10ML LL (SYRINGE) ×4 IMPLANT
TOWEL OR 17X24 6PK STRL BLUE (TOWEL DISPOSABLE) ×8 IMPLANT
TOWEL OR NON WOVEN STRL DISP B (DISPOSABLE) ×4 IMPLANT
TUBE CONNECTING 20'X1/4 (TUBING) ×1
TUBE CONNECTING 20X1/4 (TUBING) ×3 IMPLANT
YANKAUER SUCT BULB TIP NO VENT (SUCTIONS) ×4 IMPLANT

## 2013-09-12 NOTE — Transfer of Care (Signed)
Immediate Anesthesia Transfer of Care Note  Patient: Manuel [name]  Procedure(s) Performed: Procedure(s): RIGHT AXILLARY LYMPH NODE BIOPSY (Right) INSERTION PORT-A-CATH (Right)  Patient Location: PACU  Anesthesia Type:General  Level of Consciousness: sedated and patient cooperative  Airway & Oxygen Therapy: Patient Spontanous Breathing and Patient connected to face mask oxygen  Post-op Assessment: Report given to PACU RN and Post -op Vital signs reviewed and stable  Post vital signs: Reviewed and stable  Complications: No apparent anesthesia complications

## 2013-09-12 NOTE — Anesthesia Postprocedure Evaluation (Signed)
  Anesthesia Post-op Note  Patient: Manuel [name]  Procedure(s) Performed: Procedure(s): RIGHT AXILLARY LYMPH NODE BIOPSY (Right) INSERTION PORT-A-CATH (Right)  Patient Location: PACU  Anesthesia Type:General  Level of Consciousness: awake, alert  and oriented  Airway and Oxygen Therapy: Patient Spontanous Breathing and Patient connected to face mask  Post-op Pain: mild  Post-op Assessment: Post-op Vital signs reviewed  Post-op Vital Signs: Reviewed  Complications: No apparent anesthesia complications

## 2013-09-12 NOTE — Discharge Instructions (Signed)
Post Anesthesia Home Care Instructions  Activity: Get plenty of rest for the remainder of the day. A responsible adult should stay with you for 24 hours following the procedure.  For the next 24 hours, DO NOT: -Drive a car -Paediatric nurse -Drink alcoholic beverages -Take any medication unless instructed by your physician -Make any legal decisions or sign important papers.  Meals: Start with liquid foods such as gelatin or soup. Progress to regular foods as tolerated. Avoid greasy, spicy, heavy foods. If nausea and/or vomiting occur, drink only clear liquids until the nausea and/or vomiting subsides. Call your physician if vomiting continues.  Special Instructions/Symptoms: Your throat may feel dry or sore from the anesthesia or the breathing tube placed in your throat during surgery. If this causes discomfort, gargle with warm salt water. The discomfort should disappear within 24 hours. Call your surgeon if you experience:   1.  Fever over 101.0. 2.  Inability to urinate. 3.  Nausea and/or vomiting. 4.  Extreme swelling or bruising at the surgical site. 5.  Continued bleeding from the incision. 6.  Increased pain, redness or drainage from the incision. 7.  Problems related to your pain medication.   Implanted Port Insertion, Care After Refer to this sheet in the next few weeks. These instructions provide you with information on caring for yourself after your procedure. Your health care provider may also give you more specific instructions. Your treatment has been planned according to current medical practices, but problems sometimes occur. Call your health care provider if you have any problems or questions after your procedure. WHAT TO EXPECT AFTER THE PROCEDURE After your procedure, it is typical to have the following:   Discomfort at the port insertion site. Ice packs to the area will help.  Bruising on the skin over the port. This will subside in 3 4 days. HOME CARE  INSTRUCTIONS  After your port is placed, you will get a manufacturer's information card. The card has information about your port. Keep this card with you at all times.   Know what kind of port you have. There are many types of ports available.   Wear a medical alert bracelet in case of an emergency. This can help alert health care workers that you have a port.   The port can stay in for as long as your health care provider believes it is necessary.   A home health care nurse may give medicines and take care of the port.   You or a family member can get special training and directions for giving medicine and taking care of the port at home.  SEEK MEDICAL CARE IF:  Your port does not flush or you are unable to get a blood return.   SEEK IMMEDIATE MEDICAL CARE IF:  You have new fluid or pus coming from your incision.   You notice a bad smell coming from your incision site.   You have swelling, pain, or more redness at the incision or port site.   You have a fever or chills.   You have chest pain or shortness of breath. Document Released: 05/10/2013 Document Reviewed: 03/27/2013 [facility] Patient Information 2014 [place], [place]. GENERAL SURGERY: POST OP INSTRUCTIONS  1. DIET: Follow a light bland diet the first 24 hours after arrival home, such as soup, liquids, crackers, etc.  Be sure to include lots of fluids daily.  Avoid fast food or heavy meals as your are more likely to get nauseated.   2. Take your usually prescribed  home medications unless otherwise directed. 3. PAIN CONTROL: a. Pain is best controlled by a usual combination of three different methods TOGETHER: i. Ice/Heat ii. Over the counter pain medication iii. Prescription pain medication b. Most patients will experience some swelling and bruising around the incisions.  Ice packs or heating pads (30-60 minutes up to 6 times a day) will help. Use ice for the first few days to help decrease swelling and  bruising, then switch to heat to help relax tight/sore spots and speed recovery.  Some people prefer to use ice alone, heat alone, alternating between ice & heat.  Experiment to what works for you.  Swelling and bruising can take several weeks to resolve.   c. It is helpful to take an over-the-counter pain medication regularly for the first few weeks.  Choose one of the following that works best for you: i. Naproxen (Aleve, etc)  Two 220mg  tabs twice a day ii. Ibuprofen (Advil, etc) Three 200mg  tabs four times a day (every meal & bedtime) iii. Acetaminophen (Tylenol, etc) 500-650mg  four times a day (every meal & bedtime) d. A  prescription for pain medication (such as oxycodone, hydrocodone, etc) should be given to you upon discharge.  Take your pain medication as prescribed.  i. If you are having problems/concerns with the prescription medicine (does not control pain, nausea, vomiting, rash, itching, etc), please call us [phone] to see if we need to switch you to a different pain medicine that will work better for you and/or control your side effect better. ii. If you need a refill on your pain medication, please contact your pharmacy.  They will contact our office to request authorization. Prescriptions will not be filled after 5 pm or on week-ends. 4. Avoid getting constipated.  Between the surgery and the pain medications, it is common to experience some constipation.  Increasing fluid intake and taking a fiber supplement (such as Metamucil, Citrucel, FiberCon, MiraLax, etc) 1-2 times a day regularly will usually help prevent this problem from occurring.  A mild laxative (prune juice, Milk of Magnesia, MiraLax, etc) should be taken according to package directions if there are no bowel movements after 48 hours.   5. Wash / shower every day.  You may shower over the dressings as they are waterproof.  Continue to shower over incision(s) after the dressing is off. 6. Remove your waterproof bandages  5 days after surgery.  You may leave the incision open to air.  You may have skin tapes (Steri Strips) covering the incision(s).  Leave them on until one week, then remove.  You may replace a dressing/Band-Aid to cover the incision for comfort if you wish.      7. ACTIVITIES as tolerated:   a. You may resume regular (light) daily activities beginning the next day--such as daily self-care, walking, climbing stairs--gradually increasing activities as tolerated.  If you can walk 30 minutes without difficulty, it is safe to try more intense activity such as jogging, treadmill, bicycling, low-impact aerobics, swimming, etc. b. Save the most intensive and strenuous activity for last such as sit-ups, heavy lifting, contact sports, etc  Refrain from any heavy lifting or straining until you are off narcotics for pain control.   c. DO NOT PUSH THROUGH PAIN.  Let pain be your guide: If it hurts to do something, don't do it.  Pain is your body warning you to avoid that activity for another week until the pain goes down. d. You may drive when you are no  longer taking prescription pain medication, you can comfortably wear a seatbelt, and you can safely maneuver your car and apply brakes. e. Dennis Bast may have sexual intercourse when it is comfortable.  8. FOLLOW UP in our office a. Please call [facility] at [phone] to set up an appointment to see your surgeon in the office for a follow-up appointment approximately 2-3 weeks after your surgery. b. Make sure that you call for this appointment the day you arrive home to insure a convenient appointment time. 9. IF YOU HAVE DISABILITY OR FAMILY LEAVE FORMS, BRING THEM TO THE OFFICE FOR PROCESSING.  DO NOT GIVE THEM TO YOUR DOCTOR.   WHEN TO CALL us [phone]: 1. Poor pain control 2. Reactions / problems with new medications (rash/itching, nausea, etc)  3. Fever over 101.5 F (38.5 C) 4. Worsening swelling or bruising 5. Continued bleeding from  incision. 6. Increased pain, redness, or drainage from the incision 7. Difficulty breathing / swallowing   The clinic staff is available to answer your questions during regular business hours (8:30am-5pm).  Please dont hesitate to call and ask to speak to one of our nurses for clinical concerns.   If you have a medical emergency, go to the nearest emergency room or call 911.  A surgeon from [facility] is always on call at the [facility], [address], [place], [place], [place]  [address] ? MAIN: [phone] ? TOLL FREE: [phone] ?  FAX [phone] [email or web address]

## 2013-09-12 NOTE — Interval H&P Note (Signed)
History and Physical Interval Note:  09/12/2013 10:23 AM  Manuel [name]  has presented today for surgery, with the diagnosis of Lymph Node Biopsy/ Lymphadenopathy Right   The various methods of treatment have been discussed with the patient and family. After consideration of risks, benefits and other options for treatment, the patient has consented to  Procedure(s): RIGHT AXILLARY LYMPH NODE BIOPSY (Right) INSERTION PORT-A-CATH (N/A) as a surgical intervention .  The patient's history has been reviewed, patient examined, no change in status, stable for surgery.  I have reviewed the patient's chart and labs.  Questions were answered to the patient's satisfaction. Pt has decided for port placement as well even though tissue diagnosis has not been done but finding highly suspicious for lymphoma.  Pt understands need to have port removed.  Discussed risks of bleeding,  Infection,  Pneumothorax,  Hemothorax,  Organ injury,  Blood vessel injury,  Death,  catheter complications and the need for other possible operations.       Marvin Grabill A.

## 2013-09-12 NOTE — H&P (View-Only) (Signed)
Patient ID: Manuel [name], male   DOB: [birth year], 73 y.o.   MRN: [id number]  No chief complaint on file.   HPI Manuel [name] is a 73 y.o. male.  Pt sent at request of Dr Manuel [name] 4 lymphadenopathy, weight loss and night sweats for 2 months. Patient was living in [place]. Patient developed weight loss, fatigue, dehydration and required admission to hospital there. CT scan showed diffuse intra-abdominal lymphadenopathy, splenomegaly. Patient had acute renal insufficiency do to poor intake. This was corrected. He was told to seek oncology care [facility] or daughter lives. He was seen by medical oncology and felt to have a high-risk for lymphoma given diffuse lymphadenopathy and splenomegaly. CT scan was done [facility] which I do not have access to currently. He relates a 25 pound weight loss to me, severe night sweats and fatigue. Patient is here to discuss lymph node biopsy and Port-A-Cath placement. No tissue diagnosis at this point in time. HPI  Past Medical History  Diagnosis Date  . Thyroid disease   . Hyperlipemia   . Liver disease   . 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 Malnutrition 08/30/2013    Past Surgical History  Procedure Laterality Date  . Back surgery      Family History  Problem Relation Age of Onset  . Cancer Mother     breast ca  . Cancer Sister     NHL  . Cancer Brother     ?colon can  . Cancer Brother     lung ca    Social History History  Substance Use Topics  . Smoking status: Current Every Day Smoker -- 1.00 packs/day for 52 years    Types: Cigarettes  . Smokeless tobacco: Never Used  . Alcohol Use: No    No Known Allergies  Current Outpatient Prescriptions  Medication Sig Dispense Refill  . Cholecalciferol (VITAMIN D PO) Take by mouth.      . levothyroxine (SYNTHROID, LEVOTHROID) 75 MCG tablet Take 75 mcg by mouth daily.      Marland Kitchen oxyCODONE (OXY IR/ROXICODONE) 5 MG immediate release tablet Take 5  mg by mouth every 6 (six) hours as needed.       No current facility-administered medications for this visit.    Review of Systems Review of Systems  Constitutional: Positive for fever, appetite change, fatigue and unexpected weight change.  HENT: Negative.   Eyes: Negative.   Respiratory: Positive for cough.   Cardiovascular: Negative.   Gastrointestinal: Negative.   Endocrine: Negative.   Genitourinary: Negative.   Allergic/Immunologic: Negative.   Neurological: Positive for weakness.  Hematological: Positive for adenopathy. Bruises/bleeds easily.  Psychiatric/Behavioral: Negative.     Blood pressure 124/68, pulse 80, resp. rate 20, height 6\' 3"  (1.905 m), weight 175 lb 12.8 oz (79.742 kg).  Physical Exam Physical Exam  Constitutional: He is oriented to person, place, and time. He appears listless. He appears cachectic.  Non-toxic appearance. He has a sickly appearance. No distress.  Eyes: Pupils are equal, round, and reactive to light. No scleral icterus.  Neck: Normal range of motion. Neck [name].  Cardiovascular: Normal rate and regular rhythm.   Pulmonary/Chest: Effort normal and breath sounds normal. He has no rales.  Abdominal: Soft. There is splenomegaly. There is no tenderness.    Musculoskeletal: Normal range of motion.  Lymphadenopathy:       Head (right side): No submental, no submandibular, no tonsillar, no preauricular, no posterior auricular and no occipital adenopathy present.  Head (left side): No submental, no submandibular, no tonsillar, no preauricular, no posterior auricular and no occipital adenopathy present.    He has no cervical adenopathy.    He has axillary adenopathy.       Right axillary: Lateral adenopathy present. No pectoral adenopathy present.       Left axillary: No pectoral and no lateral adenopathy present.      Right: Inguinal adenopathy present.       Left: No inguinal adenopathy present.  Neurological: He is oriented to person,  place, and time. He appears listless.  Skin: Skin is warm and dry.  Psychiatric: He has a normal mood and affect. His behavior is normal. Judgment and thought content normal.    Data Reviewed Oncology notes  Assessment    Diffuse lymphadenopathy with splenomegaly, night sweats and 25 pound weight loss in last 8 weeks worrisome for lymphoma  Tobacco abuse  Malnutrition    Plan    Patient is in need of lymph node biopsy. He has multiple lymph node basins that are abnormal by exam. The groin lymphadenopathy is along the iliac vessels. He has significant right axillary lymphadenopathy as well which would be less risky to biopsy. We'll set up for right axillary lymph node biopsy since the space and is abnormal as well. Discussed the necessity for possible Port-A-Cath placement. Patient and daughter are very uncomfortable proceeding with Port-A-Cath placement without tissue diagnosis. Patient and daughter uncomfortable about Port-A-Cath placement overall after discussion of how the catheter is placed, possible risks and complications of placement and long term expectations. Discussed other options of chemotherapy access which include PICC line and peripheral stick. I explained these are not the best options for him. Port-A-Cath placement would be best and simplest. Patient and daughter expressed concern about Port-A-Cath placement. Daughter states she will talk with oncologist about this at her next visit. Risk of insertion of Port-A-Cath include bleeding, infection, pneumothorax, hemothorax, pericardial tamponadet, mediastinal injury, catheter migration, catheter fragmentation, catheter thrombosis, death, and the need for other operative interventions. These risks are all very low which they're well aware of. We'll proceed with lymph node biopsy for now. PET scan scheduled for Monday. Risk of lymph node biopsy include bleeding, infection, blood vessel injury, nerve injury, persistent lymphocele, and  the need for other operative interventions.       Manuel [name] A. 09/08/2013, 12:27 PM

## 2013-09-12 NOTE — Anesthesia Procedure Notes (Signed)
Procedure Name: LMA Insertion Date/Time: 09/12/2013 11:07 AM Performed by: Lyndee Leo Pre-anesthesia Checklist: Patient identified, Emergency Drugs available, Suction available and Patient being monitored Patient Re-evaluated:Patient Re-evaluated prior to inductionOxygen Delivery Method: Circle System Utilized Preoxygenation: Pre-oxygenation with 100% oxygen Intubation Type: IV induction Ventilation: Mask ventilation without difficulty LMA: LMA inserted LMA Size: 5.0 Number of attempts: 1 Airway Equipment and Method: bite block Placement Confirmation: positive ETCO2 Tube secured with: Tape Dental Injury: Teeth and Oropharynx as per pre-operative assessment

## 2013-09-12 NOTE — Op Note (Signed)
Manuel [name] [birth year] [id number] 09/12/2013   Preoperative diagnosis: PAC needed and lymphadenopathy  Postoperative diagnosis: Same  Procedure: Portacath Placement with U/S and fluoroscopic guidance and right axillary deep Lymph node biopsy  Surgeon: Erroll Luna, MD, FACS  Anesthesia: General and 0.25 % senorcaine with epinephrine  Clinical History and Indications: The patient is getting ready to begin chemotherapy for his cancer. He  needs a Port-A-Cath for venous access. He also requires lymph node biopsy to determine treatment for presumed lymphoma.  Very high risk of lymphoma and pt did not want to return for port placement after discussing with oncology and me.  Pt understands that there is a small chance that it will not be necessary.  The procedure has been discussed with the patient.  Alternative therapies have been discussed with the patient.  Operative risks include bleeding,  Infection,  Organ injury,  Nerve injury,  Blood vessel injury,  DVT,  Pulmonary embolism,  PTX,  Seroma Death,  And possible reoperation.  Medical management risks include worsening of present situation.  The success of the procedure is 50 -90 % at treating patients symptoms.  The patient understands and agrees to proceed.  Description of Procedure: I have seen the patient in the holding area and confirmed the plans for the procedure as noted above. I reviewed the risks and complications again and the patient has no further questions. He wishes to proceed.   The patient was then taken to the operating room. After satisfactory general  anesthesia had been obtained the upper chest and lower neck were prepped and draped as a sterile field. The timeout was done.  The right IJ  vein was entered  With U/S guidance and the guidewire threaded into the superior vena cava right atrial area under fluoroscopic guidance. An incision was then made on the anterior chest wall and a subcutaneous pocket fashioned for the  port reservoir.  The port tubing was then brought through a subcutaneous tunnel from the port site to the guidewire site. The dilator and peel-away sheath were then advanced over the guidewire while monitoring this with fluoroscopy. The guidewire and dilator were removed and the tubing threaded to approximately 20 cm. The peel-away sheath was then removed. The catheter aspirated and flushed easily. Using fluoroscopy the tip was backed out into the superior vena cava right atrial junction area. It aspirated and flushed easily. The reservoir was attached and the locking mechanism engaged. That aspirated and flushed easily.  The reservoir was secured to the fascia with 2 sutures of 2-0 Prolene. A final check with fluoroscopy was done to make sure we had no kinks and good positioning of the tip of the catheter. Everything appeared to be okay. The catheter was aspirated, flushed with dilute heparin and then concentrated aqueous heparin.  Next, the right axillary lymph node biopsy was done.  Transverse axillary incision made to 3 cm.  The node was easily palpated.  It was deep level 1 node.  Multiple matted nodes were removed the largest 3 cm.  Hemostasis achieved.  Total of 4 nodes removed.   The incisions were then closed with interrupted 3-0 Vicryl, and 4-0 Monocryl subcuticular with Dermabond on the skin.  There were no operative complications. Estimated blood loss was minimal. All counts were correct. The patient tolerated the procedure well.  Turner Daniels, MD, FACS 09/12/2013 12:17 PM

## 2013-09-12 NOTE — Anesthesia Preprocedure Evaluation (Signed)
Anesthesia Evaluation  Patient identified by MRN, date of birth, ID band Patient awake    Reviewed: Allergy & Precautions, H&P , NPO status , Patient's Chart, lab work & pertinent test results  Airway Mallampati: I TM Distance: >3 FB Neck ROM: Full    Dental  (+) Teeth Intact and Dental Advisory Given   Pulmonary Current Smoker,  breath sounds clear to auscultation        Cardiovascular Rhythm:Regular Rate:Normal     Neuro/Psych    GI/Hepatic   Endo/Other    Renal/GU      Musculoskeletal   Abdominal   Peds  Hematology   Anesthesia Other Findings   Reproductive/Obstetrics                           Anesthesia Physical Anesthesia Plan  ASA: II  Anesthesia Plan: General   Post-op Pain Management:    Induction: Intravenous  Airway Management Planned: LMA  Additional Equipment:   Intra-op Plan:   Post-operative Plan: Extubation in OR  Informed Consent: I have reviewed the patients History and Physical, chart, labs and discussed the procedure including the risks, benefits and alternatives for the proposed anesthesia with the patient or authorized representative who has indicated his/her understanding and acceptance.   Dental advisory given  Plan Discussed with: CRNA, Anesthesiologist and Surgeon  Anesthesia Plan Comments:         Anesthesia Quick Evaluation

## 2013-09-12 NOTE — Interval H&P Note (Signed)
History and Physical Interval Note:  09/12/2013 12:32 PM  Manuel [name]  has presented today for surgery, with the diagnosis of Lymph Node Biopsy/ Lymphadenopathy Right   The various methods of treatment have been discussed with the patient and family. After consideration of risks, benefits and other options for treatment, the patient has consented to  Procedure(s): RIGHT AXILLARY LYMPH NODE BIOPSY (Right) INSERTION PORT-A-CATH (Right) as a surgical intervention .  The patient's history has been reviewed, patient examined, no change in status, stable for surgery.  I have reviewed the patient's chart and labs.  Questions were answered to the patient's satisfaction.     Jonessa Triplett A.

## 2013-09-14 ENCOUNTER — Encounter (HOSPITAL_BASED_OUTPATIENT_CLINIC_OR_DEPARTMENT_OTHER): Payer: Self-pay | Admitting: Surgery

## 2013-09-16 ENCOUNTER — Encounter (HOSPITAL_COMMUNITY)
Admission: RE | Admit: 2013-09-16 | Discharge: 2013-09-16 | Disposition: A | Discharge status: Home / Self Care | Payer: Medicare PPO | Source: Ambulatory Visit | Attending: Hematology and Oncology | Admitting: Hematology and Oncology

## 2013-09-16 DIAGNOSIS — C8588 Other specified types of non-Hodgkin lymphoma, lymph nodes of multiple sites: Secondary | ICD-10-CM | POA: Insufficient documentation

## 2013-09-16 LAB — GLUCOSE, CAPILLARY: Glucose-Capillary: 101 mg/dL — ABNORMAL HIGH (ref 70–99)

## 2013-09-16 MED ORDER — FLUDEOXYGLUCOSE F - 18 (FDG) INJECTION
10.7000 | Freq: Once | INTRAVENOUS | Status: AC | PRN
Start: 2013-09-16 — End: 2013-09-16
  Administered 2013-09-16: 10.7 via INTRAVENOUS

## 2013-09-18 ENCOUNTER — Telehealth: Payer: Self-pay | Admitting: Hematology and Oncology

## 2013-09-18 ENCOUNTER — Ambulatory Visit (HOSPITAL_BASED_OUTPATIENT_CLINIC_OR_DEPARTMENT_OTHER): Payer: Medicare PPO | Admitting: Hematology and Oncology

## 2013-09-18 ENCOUNTER — Ambulatory Visit (HOSPITAL_BASED_OUTPATIENT_CLINIC_OR_DEPARTMENT_OTHER): Payer: Medicare PPO

## 2013-09-18 ENCOUNTER — Encounter: Payer: Medicare PPO | Admitting: Nutrition

## 2013-09-18 ENCOUNTER — Encounter: Payer: Self-pay | Admitting: Hematology and Oncology

## 2013-09-18 ENCOUNTER — Telehealth: Payer: Self-pay | Admitting: *Deleted

## 2013-09-18 VITALS — BP 127/58 | HR 94 | Temp 96.8°F | Resp 20 | Wt 175.2 lb

## 2013-09-18 DIAGNOSIS — D649 Anemia, unspecified: Secondary | ICD-10-CM

## 2013-09-18 DIAGNOSIS — F329 Major depressive disorder, single episode, unspecified: Secondary | ICD-10-CM

## 2013-09-18 DIAGNOSIS — R634 Abnormal weight loss: Secondary | ICD-10-CM

## 2013-09-18 DIAGNOSIS — F3289 Other specified depressive episodes: Secondary | ICD-10-CM

## 2013-09-18 DIAGNOSIS — E039 Hypothyroidism, unspecified: Secondary | ICD-10-CM

## 2013-09-18 DIAGNOSIS — C8589 Other specified types of non-Hodgkin lymphoma, extranodal and solid organ sites: Secondary | ICD-10-CM

## 2013-09-18 DIAGNOSIS — C8588 Other specified types of non-Hodgkin lymphoma, lymph nodes of multiple sites: Secondary | ICD-10-CM

## 2013-09-18 DIAGNOSIS — D539 Nutritional anemia, unspecified: Secondary | ICD-10-CM

## 2013-09-18 DIAGNOSIS — L299 Pruritus, unspecified: Secondary | ICD-10-CM

## 2013-09-18 DIAGNOSIS — E46 Unspecified protein-calorie malnutrition: Secondary | ICD-10-CM

## 2013-09-18 DIAGNOSIS — K649 Unspecified hemorrhoids: Secondary | ICD-10-CM

## 2013-09-18 DIAGNOSIS — R1032 Left lower quadrant pain: Secondary | ICD-10-CM

## 2013-09-18 LAB — CBC WITH DIFFERENTIAL/PLATELET
BASO%: 1.1 % (ref 0.0–2.0)
Basophils Absolute: 0.1 10*3/uL (ref 0.0–0.1)
EOS ABS: 0.6 10*3/uL — AB (ref 0.0–0.5)
EOS%: 6.3 % (ref 0.0–7.0)
HCT: 29.6 % — ABNORMAL LOW (ref 38.4–49.9)
HGB: 9.8 g/dL — ABNORMAL LOW (ref 13.0–17.1)
LYMPH%: 25.1 % (ref 14.0–49.0)
MCH: 31 pg (ref 27.2–33.4)
MCHC: 33.1 g/dL (ref 32.0–36.0)
MCV: 93.6 fL (ref 79.3–98.0)
MONO#: 1.2 10*3/uL — AB (ref 0.1–0.9)
MONO%: 13 % (ref 0.0–14.0)
NEUT%: 54.5 % (ref 39.0–75.0)
NEUTROS ABS: 5 10*3/uL (ref 1.5–6.5)
Platelets: 154 10*3/uL (ref 140–400)
RBC: 3.16 10*6/uL — ABNORMAL LOW (ref 4.20–5.82)
RDW: 18.9 % — ABNORMAL HIGH (ref 11.0–14.6)
WBC: 9.1 10*3/uL (ref 4.0–10.3)
lymph#: 2.3 10*3/uL (ref 0.9–3.3)

## 2013-09-18 LAB — COMPREHENSIVE METABOLIC PANEL (CC13)
ALBUMIN: 3.6 g/dL (ref 3.5–5.0)
ALK PHOS: 109 U/L (ref 40–150)
ALT: 12 U/L (ref 0–55)
ANION GAP: 9 meq/L (ref 3–11)
AST: 22 U/L (ref 5–34)
BILIRUBIN TOTAL: 0.68 mg/dL (ref 0.20–1.20)
BUN: 24.2 mg/dL (ref 7.0–26.0)
CO2: 25 meq/L (ref 22–29)
Calcium: 9.8 mg/dL (ref 8.4–10.4)
Chloride: 103 mEq/L (ref 98–109)
Creatinine: 1.2 mg/dL (ref 0.7–1.3)
GLUCOSE: 81 mg/dL (ref 70–140)
POTASSIUM: 4.9 meq/L (ref 3.5–5.1)
SODIUM: 137 meq/L (ref 136–145)
Total Protein: 7.7 g/dL (ref 6.4–8.3)

## 2013-09-18 LAB — LACTATE DEHYDROGENASE (CC13): LDH: 212 U/L (ref 125–245)

## 2013-09-18 LAB — URIC ACID (CC13): Uric Acid, Serum: 10.1 mg/dl — ABNORMAL HIGH (ref 2.6–7.4)

## 2013-09-18 LAB — HOLD TUBE, BLOOD BANK

## 2013-09-18 MED ORDER — LIDOCAINE-PRILOCAINE 2.5-2.5 % EX CREA: 1.0000 "application " | TOPICAL_CREAM | CUTANEOUS | Status: DC | PRN

## 2013-09-18 MED ORDER — ACYCLOVIR 400 MG PO TABS: 400.0000 mg | ORAL_TABLET | Freq: Every day | ORAL | Status: DC

## 2013-09-18 MED ORDER — PROCHLORPERAZINE MALEATE 10 MG PO TABS: 10.0000 mg | ORAL_TABLET | Freq: Four times a day (QID) | ORAL | Status: DC | PRN

## 2013-09-18 MED ORDER — ONDANSETRON HCL 8 MG PO TABS: 8.0000 mg | ORAL_TABLET | Freq: Three times a day (TID) | ORAL | Status: DC | PRN

## 2013-09-18 MED ORDER — ALLOPURINOL 300 MG PO TABS: 300.0000 mg | ORAL_TABLET | Freq: Every day | ORAL | Status: DC

## 2013-09-18 NOTE — Telephone Encounter (Signed)
Gave pt appt for lab,md and chemo for [facility] and [facility] 2015

## 2013-09-18 NOTE — Telephone Encounter (Signed)
Give pt appt for lab and MD emailed Sharyn Lull regarding chemo for February 2015

## 2013-09-18 NOTE — Telephone Encounter (Signed)
Per staff message and POF I have scheduled appts.  JMW  

## 2013-09-18 NOTE — Progress Notes (Signed)
[place] OFFICE PROGRESS NOTE  Patient Care Team: Provider Default, MD as PCP - General  DIAGNOSIS: NHL  SUMMARY OF ONCOLOGIC HISTORY: This patient presented with skin itching, weight loss, diffuse lymphadenopathy and splenomegaly. He underwent PET scan, LN biopsy, port placement, blood work and was diagnosed with high grade NHL, subtype pending  INTERVAL HISTORY: Manuel [name] 73 y.o. male returns for further follow-up. His appetite had improved. Pain in the LUQ is improving with pain medicine. He still complained of diffuse skin itching. LN biopsy site was ok.  I have reviewed the past medical history, past surgical history, social history and family history with the patient and they are unchanged from previous note.  ALLERGIES:  has No Known Allergies.  MEDICATIONS:  Current Outpatient Prescriptions  Medication Sig Dispense Refill  . Cholecalciferol (VITAMIN D PO) Take 2,000 Units by mouth.       . levothyroxine (SYNTHROID, LEVOTHROID) 75 MCG tablet Take 75 mcg by mouth daily.      Marland Kitchen oxyCODONE-acetaminophen (ROXICET) 5-325 MG per tablet Take 1 tablet by mouth every 4 (four) hours as needed.  30 tablet  0  . acyclovir (ZOVIRAX) 400 MG tablet Take 1 tablet (400 mg total) by mouth daily.  30 tablet  3  . allopurinol (ZYLOPRIM) 300 MG tablet Take 1 tablet (300 mg total) by mouth daily.  30 tablet  3  . lidocaine-prilocaine (EMLA) cream Apply 1 application topically as needed.  30 g  0  . ondansetron (ZOFRAN) 8 MG tablet Take 1 tablet (8 mg total) by mouth every 8 (eight) hours as needed for nausea.  30 tablet  1  . prochlorperazine (COMPAZINE) 10 MG tablet Take 1 tablet (10 mg total) by mouth every 6 (six) hours as needed (Nausea or vomiting).  30 tablet  1   No current facility-administered medications for this visit.    REVIEW OF SYSTEMS:   All other systems were reviewed with the patient and are negative.  PHYSICAL EXAMINATION: ECOG PERFORMANCE STATUS: 1 -  Symptomatic but completely ambulatory  Filed Vitals:   09/18/13 1344  BP: 127/58  Pulse: 94  Temp: 96.8 F (36 C)  Resp: 20   Filed Weights   09/18/13 1344  Weight: 175 lb 3 oz (79.465 kg)    GENERAL:alert, no distress and comfortable SKIN: skin color, texture, turgor are normal, no rashes or significant lesions LYMPH:  Biopsy site is healing well.  Musculoskeletal:no cyanosis of digits and no clubbing  NEURO: alert & oriented x 3 with fluent speech, no focal motor/sensory deficits  LABORATORY DATA:  I have reviewed the data as listed    Component Value Date/Time   NA 137 09/18/2013 1437   K 4.9 09/18/2013 1437   CO2 25 09/18/2013 1437   GLUCOSE 81 09/18/2013 1437   BUN 24.2 09/18/2013 1437   CREATININE 1.2 09/18/2013 1437   CALCIUM 9.8 09/18/2013 1437   PROT 7.7 09/18/2013 1437   ALBUMIN 3.6 09/18/2013 1437   AST 22 09/18/2013 1437   ALT 12 09/18/2013 1437   ALKPHOS 109 09/18/2013 1437   BILITOT 0.68 09/18/2013 1437    No results found for this basename: SPEP,  UPEP,   kappa and lambda light chains    Lab Results  Component Value Date   WBC 9.1 09/18/2013   NEUTROABS 5.0 09/18/2013   HGB 9.8* 09/18/2013   HCT 29.6* 09/18/2013   MCV 93.6 09/18/2013   PLT 154 09/18/2013      Chemistry  Component Value Date/Time   NA 137 09/18/2013 1437   K 4.9 09/18/2013 1437   CO2 25 09/18/2013 1437   BUN 24.2 09/18/2013 1437   CREATININE 1.2 09/18/2013 1437      Component Value Date/Time   CALCIUM 9.8 09/18/2013 1437   ALKPHOS 109 09/18/2013 1437   AST 22 09/18/2013 1437   ALT 12 09/18/2013 1437   BILITOT 0.68 09/18/2013 1437      RADIOGRAPHIC STUDIES:I reviewed PET Ct with him and his daughter I have personally reviewed the radiological images as listed and agreed with the findings in the report.  LN biopsy showed high grade lymphoma, suspicious for possible large cell lymphoma versus mantle cell lymphoma   ASSESSMENT & PLAN:  #1 High grade lymphoma, subtype pending I discussed  with the patient the importance of staging. He agreed to defer BM biopsy till after Rx as it would not change management now. 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 I spoke with the pathologist, [place] for cyclin D1 pending. I have requested to review his case at the next hematology conference. I discussed with them recent data suggested Bendamustine/Rituximab is superior to RCHOP chemotherapy with high rate of complete remission and less toxicity. We discussed the role of chemotherapy. The intent is for cure.  We discussed some of the risks, benefits and side-effects of Rituximab,and Bendamustine  Some of the short term side-effects included, though not limited to, risk of fatigue, weight loss, tumor lysis syndrome, risk of allergic reactions, pancytopenia, life-threatening infections, need for transfusions of blood products, nausea, vomiting, change in bowel habits, hair loss, admission to hospital for various reasons, and risks of death.    Long term side-effects are also discussed including permanent damage to nerve function, chronic fatigue, and rare secondary malignancy including bone marrow disorders.   The patient is aware that the response rates discussed earlier is not guaranteed.    After a long discussion, patient made an informed decision to proceed with the prescribed plan of care and went ahead to sign the consent form today.   Patient education material was dispensed We will proceed with chemotherapy this week #2 Tumor lysis prophylaxis I will start allopurinol and encourage oral hydration #3 significant weight loss with depression I recommend frequent small meals. Will consider starting him on antidepressants in the future #4 chronic anemia with intermittent hemorrhoidal bleeding I recommend him to stop iron supplement. The cause of his anemia is likely due to lymphoma, anemia of chronic disease or bone marrow infiltration. Repeat hemoglobin is  stable #5 left upper quadrant pain I recommend he take oxycodone as needed  #6 hypothyroidism I recommend he continue his thyroid supplement #7 diffuse itching This is likely due to undiagnosed malignancy. I recommend over-the-counter medicine such as Benadryl #8 severe malnutrition I recommend nutrition consult #9 Antimicrobial prophylaxis He will start Acyclovir. Hepatitis B panel is negative  Orders Placed This Encounter  Procedures  . Uric Acid    Standing Status: Future     Number of Occurrences: 1     Standing Expiration Date: 09/18/2014  . Lactate dehydrogenase    Standing Status: Future     Number of Occurrences: 1     Standing Expiration Date: 09/18/2014  . CBC with Differential    Standing Status: Future     Number of Occurrences: 1     Standing Expiration Date: 09/18/2014  . Comprehensive metabolic panel    Standing Status: Future  Number of Occurrences: 1     Standing Expiration Date: 09/18/2014  . Hold Tube, Blood Bank    Standing Status: Future     Number of Occurrences: 1     Standing Expiration Date: 09/18/2014   All questions were answered. The patient knows to call the clinic with any problems, questions or concerns. No barriers to learning was detected. I spent 40 minutes counseling the patient face to face. The total time spent in the appointment was 60 minutes and more than 50% was on counseling and review of test results     [facility], [place], MD 09/18/2013 7:35 PM

## 2013-09-18 NOTE — Telephone Encounter (Signed)
Pt's daughter called and r/s appt 1:15 pm and cancelled nutrition per daughter

## 2013-09-21 ENCOUNTER — Ambulatory Visit (HOSPITAL_BASED_OUTPATIENT_CLINIC_OR_DEPARTMENT_OTHER): Payer: Medicare PPO

## 2013-09-21 ENCOUNTER — Encounter: Payer: Self-pay | Admitting: Hematology and Oncology

## 2013-09-21 ENCOUNTER — Encounter: Payer: Medicare PPO | Admitting: Nutrition

## 2013-09-21 ENCOUNTER — Ambulatory Visit: Payer: Medicare PPO | Admitting: Nutrition

## 2013-09-21 ENCOUNTER — Other Ambulatory Visit: Payer: Self-pay | Admitting: *Deleted

## 2013-09-21 VITALS — BP 130/61 | HR 106 | Temp 98.5°F | Resp 16

## 2013-09-21 DIAGNOSIS — C8588 Other specified types of non-Hodgkin lymphoma, lymph nodes of multiple sites: Secondary | ICD-10-CM

## 2013-09-21 DIAGNOSIS — C8589 Other specified types of non-Hodgkin lymphoma, extranodal and solid organ sites: Secondary | ICD-10-CM

## 2013-09-21 DIAGNOSIS — D539 Nutritional anemia, unspecified: Secondary | ICD-10-CM

## 2013-09-21 DIAGNOSIS — Z5111 Encounter for antineoplastic chemotherapy: Secondary | ICD-10-CM

## 2013-09-21 MED ORDER — DIPHENHYDRAMINE HCL 50 MG/ML IJ SOLN
25.0000 mg | Freq: Once | INTRAMUSCULAR | Status: AC | PRN
  Administered 2013-09-21: 25 mg via INTRAVENOUS

## 2013-09-21 MED ORDER — DEXAMETHASONE SODIUM PHOSPHATE 10 MG/ML IJ SOLN
INTRAMUSCULAR | Status: AC
  Filled 2013-09-21: qty 1

## 2013-09-21 MED ORDER — LIDOCAINE-PRILOCAINE 2.5-2.5 % EX CREA: 1.0000 "application " | TOPICAL_CREAM | CUTANEOUS | Status: DC | PRN

## 2013-09-21 MED ORDER — ACETAMINOPHEN 325 MG PO TABS
650.0000 mg | ORAL_TABLET | Freq: Once | ORAL | Status: AC
  Administered 2013-09-21: 650 mg via ORAL

## 2013-09-21 MED ORDER — DIPHENHYDRAMINE HCL 25 MG PO CAPS
ORAL_CAPSULE | ORAL | Status: AC
  Filled 2013-09-21: qty 2

## 2013-09-21 MED ORDER — DEXAMETHASONE SODIUM PHOSPHATE 10 MG/ML IJ SOLN
10.0000 mg | Freq: Once | INTRAMUSCULAR | Status: AC
  Administered 2013-09-21: 10 mg via INTRAVENOUS

## 2013-09-21 MED ORDER — ONDANSETRON 8 MG/50ML IVPB (CHCC)
8.0000 mg | Freq: Once | INTRAVENOUS | Status: AC
  Administered 2013-09-21: 8 mg via INTRAVENOUS

## 2013-09-21 MED ORDER — SODIUM CHLORIDE 0.9 % IJ SOLN
10.0000 mL | INTRAMUSCULAR | Status: DC | PRN
  Administered 2013-09-21: 10 mL
  Filled 2013-09-21: qty 10

## 2013-09-21 MED ORDER — HEPARIN SOD (PORK) LOCK FLUSH 100 UNIT/ML IV SOLN
500.0000 [IU] | Freq: Once | INTRAVENOUS | Status: AC | PRN
  Administered 2013-09-21: 500 [IU]
  Filled 2013-09-21: qty 5

## 2013-09-21 MED ORDER — DIPHENHYDRAMINE HCL 25 MG PO CAPS
50.0000 mg | ORAL_CAPSULE | Freq: Once | ORAL | Status: AC
  Administered 2013-09-21: 50 mg via ORAL

## 2013-09-21 MED ORDER — ACETAMINOPHEN 325 MG PO TABS
ORAL_TABLET | ORAL | Status: AC
  Filled 2013-09-21: qty 2

## 2013-09-21 MED ORDER — SODIUM CHLORIDE 0.9 % IV SOLN
Freq: Once | INTRAVENOUS | Status: AC
  Administered 2013-09-21: 09:00:00 via INTRAVENOUS

## 2013-09-21 MED ORDER — BENDAMUSTINE HCL CHEMO INJECTION 180 MG/2ML
90.0000 mg/m2 | Freq: Once | INTRAVENOUS | Status: AC
  Administered 2013-09-21: 189 mg via INTRAVENOUS
  Filled 2013-09-21: qty 2.1

## 2013-09-21 MED ORDER — METHYLPREDNISOLONE SODIUM SUCC 125 MG IJ SOLR
125.0000 mg | Freq: Once | INTRAMUSCULAR | Status: AC | PRN
  Administered 2013-09-21: 125 mg via INTRAVENOUS

## 2013-09-21 MED ORDER — ONDANSETRON 8 MG/NS 50 ML IVPB
INTRAVENOUS | Status: AC
  Filled 2013-09-21: qty 8

## 2013-09-21 MED ORDER — RITUXIMAB CHEMO INJECTION 10 MG/ML
375.0000 mg/m2 | Freq: Once | INTRAVENOUS | Status: AC
  Administered 2013-09-21: 800 mg via INTRAVENOUS
  Filled 2013-09-21: qty 80

## 2013-09-21 NOTE — Patient Instructions (Addendum)
[facility] Discharge Instructions for Patients Receiving Chemotherapy  Today you received the following chemotherapy agents : Rituxan and Treanda  To help prevent nausea and vomiting after your treatment, we encourage you to take your nausea medications as directed: TAKE AT THE FIRST SIGN OF NAUSEA-DO NOT WAIT   Zofran 8 mg every 8 hours as needed--take one this evening near supper meal.  Compazine 10 mg every 6 hours as needed (may cause drowsiness)  If you develop nausea and vomiting that is not controlled by your nausea medication, call the clinic.   BELOW ARE SYMPTOMS THAT SHOULD BE REPORTED IMMEDIATELY:  *FEVER GREATER THAN 100.5 F  *CHILLS WITH OR WITHOUT FEVER  NAUSEA AND VOMITING THAT IS NOT CONTROLLED WITH YOUR NAUSEA MEDICATION  *UNUSUAL SHORTNESS OF BREATH  *UNUSUAL BRUISING OR BLEEDING  TENDERNESS IN MOUTH AND THROAT WITH OR WITHOUT PRESENCE OF ULCERS  *URINARY PROBLEMS  *BOWEL PROBLEMS  UNUSUAL RASH Items with * indicate a potential emergency and should be followed up as soon as possible.  Feel free to call the clinic should you have any questions or concerns. The clinic phone number is [phone].  It has been a pleasure to serve you today!

## 2013-09-21 NOTE — Progress Notes (Unsigned)
PAC accessed without difficulty-brisk blood return. Did not have EMLA cream (was unaware of script being called in). Tolerated without difficulty. Reviewed chemo drugs and side effects with patient and daughter. Did not attend chemo class and patient refuses to attend. His daughter will attend the 12:30 class today.  Patient is somewhat hostile upon admission,but mood improved after he was here few minutes. Daughter reports he has always been an angry person-can never see the positive in anything.

## 2013-09-21 NOTE — Progress Notes (Signed)
Received letter from Patient [employer].  Pt is approved for Neulasta from 09/21/13 to 09/20/14 or when the benefit cap has been met.  Expenses can be submitted for reimbursement for dos 06/23/13 to 09/20/13.  The amount of the grant is $4100.

## 2013-09-21 NOTE — Progress Notes (Signed)
A 73 year old male diagnosed with lymphoma.  He is a patient of Dr. Alvy Bimler.  Past medical history includes thyroid disease, hyperlipidemia, and malnutrition.  Medications include ferrous sulfate, Synthroid, and Pravachol.  Labs include albumin 3.2 on January 28.  Height: 6 feet 3 inches. Weight: 175.7 pounds. Body weight.  195-205 pounds. BMI: 21.96.    Patient's daughter states she has power of attorney and her father will not listen or remember nutrition information.  Nutrition appointment have been cancelled twice.  Wife prefers to speak with me today.  Patient is receiving chemotherapy.  I spoke with patient, who agrees for me to talk to daughter regarding his nutrition.  Per patient's daughter, patient has lost approximately 25 pounds.  He was living in [facility], but now is being cared for at his daughter's home.  She prepares foods for him and "cooks like her mother did."  Patient is eating high-protein foods throughout the day and daughter reports 3-4 pound weight gain.  Daughter is concerned regarding patient's response to chemotherapy and fears he will have nausea.  She states he does not want to take nausea or pain medication.  Nutrition diagnosis: Food and nutrition related knowledge deficit related to diagnosis of lymphoma and associated treatments as evidenced by no prior need for nutrition related information.  Intervention: Daughter educated to continue high-calorie, high-protein foods in small amounts throughout the day.  He should continue oral nutrition supplements as desired.  Provided patient's daughter with many options for increase variety.  Gave her recipes, coupons, and fact sheets.  Questions were answered.  Teach back method used.  Monitoring, evaluation, goals: Patient will tolerate adequate calories and protein to minimize weight loss throughout treatment.    Next visit: Patient's daughter will contact me if she has further questions or concerns.

## 2013-09-21 NOTE — Progress Notes (Unsigned)
Clarified port report with Dr. Clovis Riley, radiologist. Tip in SVC per PET. Requested addendum to be added to report.

## 2013-09-21 NOTE — Progress Notes (Unsigned)
@   1127 developed rigors/chills. Stopped Rituxan and started NS wide open. Started emergency meds and MD notified.

## 2013-09-21 NOTE — Progress Notes (Unsigned)
@  1240 Rituxan resumed at 150 mg/hr = 62cc/hr for 31cc after clearing OK with physician. Patient is currently comfortable with no further s/s of reaction.

## 2013-09-22 ENCOUNTER — Ambulatory Visit (HOSPITAL_BASED_OUTPATIENT_CLINIC_OR_DEPARTMENT_OTHER): Payer: Medicare PPO

## 2013-09-22 ENCOUNTER — Telehealth: Payer: Self-pay | Admitting: *Deleted

## 2013-09-22 VITALS — BP 136/63 | HR 66 | Temp 97.3°F | Resp 18

## 2013-09-22 DIAGNOSIS — C8588 Other specified types of non-Hodgkin lymphoma, lymph nodes of multiple sites: Secondary | ICD-10-CM

## 2013-09-22 DIAGNOSIS — Z5111 Encounter for antineoplastic chemotherapy: Secondary | ICD-10-CM

## 2013-09-22 DIAGNOSIS — C8589 Other specified types of non-Hodgkin lymphoma, extranodal and solid organ sites: Secondary | ICD-10-CM

## 2013-09-22 MED ORDER — DEXAMETHASONE SODIUM PHOSPHATE 10 MG/ML IJ SOLN
INTRAMUSCULAR | Status: AC
  Filled 2013-09-22: qty 1

## 2013-09-22 MED ORDER — ONDANSETRON 8 MG/50ML IVPB (CHCC)
8.0000 mg | Freq: Once | INTRAVENOUS | Status: AC
  Administered 2013-09-22: 8 mg via INTRAVENOUS

## 2013-09-22 MED ORDER — DEXAMETHASONE SODIUM PHOSPHATE 10 MG/ML IJ SOLN
10.0000 mg | Freq: Once | INTRAMUSCULAR | Status: AC
  Administered 2013-09-22: 10 mg via INTRAVENOUS

## 2013-09-22 MED ORDER — SODIUM CHLORIDE 0.9 % IV SOLN
Freq: Once | INTRAVENOUS | Status: AC
  Administered 2013-09-22: 10:00:00 via INTRAVENOUS

## 2013-09-22 MED ORDER — ONDANSETRON 8 MG/NS 50 ML IVPB
INTRAVENOUS | Status: AC
  Filled 2013-09-22: qty 8

## 2013-09-22 MED ORDER — SODIUM CHLORIDE 0.9 % IV SOLN
90.0000 mg/m2 | Freq: Once | INTRAVENOUS | Status: AC
  Administered 2013-09-22: 189 mg via INTRAVENOUS
  Filled 2013-09-22: qty 2.1

## 2013-09-22 MED ORDER — SODIUM CHLORIDE 0.9 % IJ SOLN
10.0000 mL | INTRAMUSCULAR | Status: DC | PRN
  Administered 2013-09-22: 10 mL
  Filled 2013-09-22: qty 10

## 2013-09-22 MED ORDER — HEPARIN SOD (PORK) LOCK FLUSH 100 UNIT/ML IV SOLN
500.0000 [IU] | Freq: Once | INTRAVENOUS | Status: AC | PRN
  Administered 2013-09-22: 500 [IU]
  Filled 2013-09-22: qty 5

## 2013-09-22 NOTE — Patient Instructions (Signed)
[facility] Discharge Instructions for Patients Receiving Chemotherapy  Today you received the following chemotherapy agent: Treanda   To help prevent nausea and vomiting after your treatment, we encourage you to take your nausea medication as prescribed.   If you develop nausea and vomiting that is not controlled by your nausea medication, call the clinic.   BELOW ARE SYMPTOMS THAT SHOULD BE REPORTED IMMEDIATELY:  *FEVER GREATER THAN 100.5 F  *CHILLS WITH OR WITHOUT FEVER  NAUSEA AND VOMITING THAT IS NOT CONTROLLED WITH YOUR NAUSEA MEDICATION  *UNUSUAL SHORTNESS OF BREATH  *UNUSUAL BRUISING OR BLEEDING  TENDERNESS IN MOUTH AND THROAT WITH OR WITHOUT PRESENCE OF ULCERS  *URINARY PROBLEMS  *BOWEL PROBLEMS  UNUSUAL RASH Items with * indicate a potential emergency and should be followed up as soon as possible.  Feel free to call the clinic you have any questions or concerns. The clinic phone number is [phone].   Bendamustine Injection What is this medicine? BENDAMUSTINE (BEN da MUS teen) is a chemotherapy drug. It is used to treat chronic lymphocytic leukemia and non-Hodgkin lymphoma. This medicine may be used for other purposes; ask your health care provider or pharmacist if you have questions. COMMON BRAND NAME(S): Treanda What should I tell my health care provider before I take this medicine? They need to know if you have any of these conditions: -kidney disease -liver disease -an unusual or allergic reaction to bendamustine, mannitol, other medicines, foods, dyes, or preservatives -pregnant or trying to get pregnant -breast-feeding How should I use this medicine? This medicine is for infusion into a vein. It is given by a health care professional in a hospital or clinic setting. Talk to your pediatrician regarding the use of this medicine in children. Special care may be needed. Overdosage: If you think you have taken too much of this medicine  contact a poison control center or emergency room at once. NOTE: This medicine is only for you. Do not share this medicine with others. What if I miss a dose? It is important not to miss your dose. Call your doctor or health care professional if you are unable to keep an appointment. What may interact with this medicine? Do not take this medicine with any of the following medications: -clozapine This medicine may also interact with the following medications: -atazanavir -cimetidine -ciprofloxacin -enoxacin -fluvoxamine -medicines for seizures like carbamazepine and phenobarbital -mexiletine -rifampin -tacrine -thiabendazole -zileuton This list may not describe all possible interactions. Give your health care provider a list of all the medicines, herbs, non-prescription drugs, or dietary supplements you use. Also tell them if you smoke, drink alcohol, or use illegal drugs. Some items may interact with your medicine. What should I watch for while using this medicine? Your condition will be monitored carefully while you are receiving this medicine. This drug may make you feel generally unwell. This is not uncommon, as chemotherapy can affect healthy cells as well as cancer cells. Report any side effects. Continue your course of treatment even though you feel ill unless your doctor tells you to stop. Call your doctor or health care professional for advice if you get a fever, chills or sore throat, or other symptoms of a cold or flu. Do not treat yourself. This drug decreases your body's ability to fight infections. Try to avoid being around people who are sick. This medicine may increase your risk to bruise or bleed. Call your doctor or health care professional if you notice any unusual bleeding. Be careful brushing  and flossing your teeth or using a toothpick because you may get an infection or bleed more easily. If you have any dental work done, tell your dentist you are receiving this  medicine. Avoid taking products that contain aspirin, acetaminophen, ibuprofen, naproxen, or ketoprofen unless instructed by your doctor. These medicines may hide a fever. Do not become pregnant while taking this medicine. Women should inform their doctor if they wish to become pregnant or think they might be pregnant. There is a potential for serious side effects to an unborn child. Men should inform their doctors if they wish to father a child. This medicine may lower sperm counts. Talk to your health care professional or pharmacist for more information. Do not breast-feed an infant while taking this medicine. What side effects may I notice from receiving this medicine? Side effects that you should report to your doctor or health care professional as soon as possible: -allergic reactions like skin rash, itching or hives, swelling of the face, lips, or tongue -low blood counts - this medicine may decrease the number of white blood cells, red blood cells and platelets. You may be at increased risk for infections and bleeding. -signs of infection - fever or chills, cough, sore throat, pain or difficulty passing urine -signs of decreased platelets or bleeding - bruising, pinpoint red spots on the skin, black, tarry stools, blood in the urine -signs of decreased red blood cells - unusually weak or tired, fainting spells, lightheadedness -trouble passing urine or change in the amount of urine Side effects that usually do not require medical attention (report to your doctor or health care professional if they continue or are bothersome): -diarrhea This list may not describe all possible side effects. Call your doctor for medical advice about side effects. You may report side effects to FDA at 1-800-FDA-1088. Where should I keep my medicine? This drug is given in a hospital or clinic and will not be stored at home. NOTE: This sheet is a summary. It may not cover all possible information. If you have  questions about this medicine, talk to your doctor, pharmacist, or health care provider.  2014, [employer]vier/[employer] Standard. (2011-10-16 14:15:47)

## 2013-09-22 NOTE — Telephone Encounter (Signed)
Message copied by Cherylynn Ridges on Fri Sep 22, 2013  3:53 PM ------      Message from: Tania Ade      Created: Thu Sep 21, 2013  4:59 PM      Regarding: Chemo Follow Up Call       1st Rituxan/Treanda on 09/21/13--call on 2/23 ------

## 2013-09-22 NOTE — Telephone Encounter (Signed)
Called Dorise Hiss for chemotherapy F/U.  Actually due for call tomorrow.  Thus far he is doing well.  Denies n/v.  Denies any new side effects or symptoms.  Bowel and bladder is functioning well.  Eating and drinking well and I instructed to drink 64 oz minimum daily or at least the day before, of and after treatment.  Denies questions at this time and encouraged to call if needed.  Reviewed how to call after hours in the case of an emergency.

## 2013-09-23 ENCOUNTER — Ambulatory Visit (HOSPITAL_BASED_OUTPATIENT_CLINIC_OR_DEPARTMENT_OTHER): Payer: Medicare PPO

## 2013-09-23 VITALS — BP 135/68 | HR 76 | Temp 97.1°F | Resp 17

## 2013-09-23 DIAGNOSIS — C8588 Other specified types of non-Hodgkin lymphoma, lymph nodes of multiple sites: Secondary | ICD-10-CM

## 2013-09-23 DIAGNOSIS — Z5189 Encounter for other specified aftercare: Secondary | ICD-10-CM

## 2013-09-23 DIAGNOSIS — C8589 Other specified types of non-Hodgkin lymphoma, extranodal and solid organ sites: Secondary | ICD-10-CM

## 2013-09-23 MED ORDER — PEGFILGRASTIM INJECTION 6 MG/0.6ML
6.0000 mg | Freq: Once | SUBCUTANEOUS | Status: AC
  Administered 2013-09-23: 6 mg via SUBCUTANEOUS

## 2013-09-23 NOTE — Patient Instructions (Signed)

## 2013-09-25 ENCOUNTER — Telehealth: Payer: Self-pay | Admitting: *Deleted

## 2013-09-25 NOTE — Telephone Encounter (Signed)
Message copied by Cherylynn Ridges on Mon Sep 25, 2013  3:35 PM ------      Message from: Tania Ade      Created: Thu Sep 21, 2013  4:59 PM      Regarding: Chemo Follow Up Call       1st Rituxan/Treanda on 09/21/13--call on 2/23 ------

## 2013-09-25 NOTE — Telephone Encounter (Signed)
Called Manuel [name] at daughter's home/temporary number(s).  Message left requesting a return call for chemotherapy follow up.  Awaiting return call from patient.

## 2013-09-26 ENCOUNTER — Other Ambulatory Visit: Payer: Self-pay | Admitting: Hematology and Oncology

## 2013-09-28 ENCOUNTER — Encounter: Payer: Self-pay | Admitting: Hematology and Oncology

## 2013-09-28 ENCOUNTER — Telehealth: Payer: Self-pay | Admitting: *Deleted

## 2013-09-28 ENCOUNTER — Other Ambulatory Visit: Payer: Self-pay | Admitting: Hematology and Oncology

## 2013-09-28 DIAGNOSIS — M109 Gout, unspecified: Secondary | ICD-10-CM

## 2013-09-28 HISTORY — DX: Gout, unspecified: M10.9

## 2013-09-28 LAB — TISSUE HYBRIDIZATION TO NCBH

## 2013-09-28 MED ORDER — PREDNISONE 20 MG PO TABS: 20.0000 mg | ORAL_TABLET | Freq: Every day | ORAL | Status: DC

## 2013-09-28 NOTE — Telephone Encounter (Signed)
Daughter reports pt has developed Gout in his toe.  He has history of gout and left his usual gout medication in [place].   He is taking the allopurinol as prescribed.  She states he is "hobbling around" and asks if any medication Dr. Alvy Bimler can prescribe for this?  Other than this, he has been doing well this past week after his chemo treatment last week.

## 2013-09-28 NOTE — Telephone Encounter (Signed)
I have escribe prednisone 20 mg daily for 10 days to his pharmacy

## 2013-09-28 NOTE — Telephone Encounter (Signed)
Informed daughter of Prednisone prescribed and for pt to take in the morning with food.  Encourage plenty of fluid intake.  She verbalized understanding.

## 2013-09-29 ENCOUNTER — Telehealth: Payer: Self-pay | Admitting: *Deleted

## 2013-09-29 NOTE — Telephone Encounter (Signed)
Dau reports gout in toe has moved to foot and ankle, which are now swollen.  Pt has to use a walker to get around.  He has percocet which he is taking for pain about every 8 hrs.  They are also icing the foot. Pt started prednisone this morning.  Dau just wants to make sure there is nothing else she/pt should be doing?  She says pt did not want her to call us.   Per Dr. Alvy Bimler, Prednisone works fast,  Pt can double the dose to 40 mg on Sunday if he is still in a lot of pain.  Informed daughter of above and she verbalized understanding.  Pt has appt to see Dr. Alvy Bimler on Monday.

## 2013-10-02 ENCOUNTER — Ambulatory Visit: Payer: Medicare PPO

## 2013-10-02 ENCOUNTER — Telehealth: Payer: Self-pay | Admitting: Hematology and Oncology

## 2013-10-02 ENCOUNTER — Other Ambulatory Visit (HOSPITAL_BASED_OUTPATIENT_CLINIC_OR_DEPARTMENT_OTHER): Payer: Medicare PPO

## 2013-10-02 ENCOUNTER — Encounter (INDEPENDENT_AMBULATORY_CARE_PROVIDER_SITE_OTHER): Payer: Self-pay

## 2013-10-02 ENCOUNTER — Encounter: Payer: Self-pay | Admitting: Hematology and Oncology

## 2013-10-02 ENCOUNTER — Ambulatory Visit (HOSPITAL_BASED_OUTPATIENT_CLINIC_OR_DEPARTMENT_OTHER): Payer: Medicare PPO | Admitting: Hematology and Oncology

## 2013-10-02 ENCOUNTER — Other Ambulatory Visit: Payer: Self-pay | Admitting: Hematology and Oncology

## 2013-10-02 ENCOUNTER — Telehealth: Payer: Self-pay | Admitting: *Deleted

## 2013-10-02 VITALS — BP 139/63 | HR 84 | Temp 97.9°F | Resp 18 | Ht 75.0 in | Wt 177.4 lb

## 2013-10-02 DIAGNOSIS — C8590 Non-Hodgkin lymphoma, unspecified, unspecified site: Secondary | ICD-10-CM

## 2013-10-02 DIAGNOSIS — M109 Gout, unspecified: Secondary | ICD-10-CM

## 2013-10-02 DIAGNOSIS — D72829 Elevated white blood cell count, unspecified: Secondary | ICD-10-CM

## 2013-10-02 DIAGNOSIS — C8589 Other specified types of non-Hodgkin lymphoma, extranodal and solid organ sites: Secondary | ICD-10-CM

## 2013-10-02 DIAGNOSIS — D649 Anemia, unspecified: Secondary | ICD-10-CM

## 2013-10-02 DIAGNOSIS — K5900 Constipation, unspecified: Secondary | ICD-10-CM

## 2013-10-02 LAB — COMPREHENSIVE METABOLIC PANEL (CC13)
ALT: 30 U/L (ref 0–55)
ANION GAP: 7 meq/L (ref 3–11)
AST: 22 U/L (ref 5–34)
Albumin: 3.4 g/dL — ABNORMAL LOW (ref 3.5–5.0)
Alkaline Phosphatase: 130 U/L (ref 40–150)
BUN: 23.8 mg/dL (ref 7.0–26.0)
CALCIUM: 10 mg/dL (ref 8.4–10.4)
CHLORIDE: 109 meq/L (ref 98–109)
CO2: 25 meq/L (ref 22–29)
Creatinine: 0.9 mg/dL (ref 0.7–1.3)
Glucose: 86 mg/dl (ref 70–140)
Potassium: 4.1 mEq/L (ref 3.5–5.1)
Sodium: 141 mEq/L (ref 136–145)
Total Bilirubin: 0.42 mg/dL (ref 0.20–1.20)
Total Protein: 7.8 g/dL (ref 6.4–8.3)

## 2013-10-02 LAB — CBC WITH DIFFERENTIAL/PLATELET
BASO%: 0.7 % (ref 0.0–2.0)
Basophils Absolute: 0.1 10*3/uL (ref 0.0–0.1)
EOS ABS: 0.2 10*3/uL (ref 0.0–0.5)
EOS%: 0.8 % (ref 0.0–7.0)
HCT: 29.5 % — ABNORMAL LOW (ref 38.4–49.9)
HGB: 9.8 g/dL — ABNORMAL LOW (ref 13.0–17.1)
LYMPH%: 2 % — AB (ref 14.0–49.0)
MCH: 31.7 pg (ref 27.2–33.4)
MCHC: 33.1 g/dL (ref 32.0–36.0)
MCV: 95.9 fL (ref 79.3–98.0)
MONO#: 1.8 10*3/uL — ABNORMAL HIGH (ref 0.1–0.9)
MONO%: 8 % (ref 0.0–14.0)
NEUT%: 88.5 % — ABNORMAL HIGH (ref 39.0–75.0)
NEUTROS ABS: 19.5 10*3/uL — AB (ref 1.5–6.5)
PLATELETS: 306 10*3/uL (ref 140–400)
RBC: 3.07 10*6/uL — ABNORMAL LOW (ref 4.20–5.82)
RDW: 20 % — AB (ref 11.0–14.6)
WBC: 22 10*3/uL — ABNORMAL HIGH (ref 4.0–10.3)
lymph#: 0.4 10*3/uL — ABNORMAL LOW (ref 0.9–3.3)

## 2013-10-02 LAB — URIC ACID (CC13): URIC ACID, SERUM: 6.9 mg/dL (ref 2.6–7.4)

## 2013-10-02 LAB — LACTATE DEHYDROGENASE (CC13): LDH: 146 U/L (ref 125–245)

## 2013-10-02 LAB — HOLD TUBE, BLOOD BANK

## 2013-10-02 MED ORDER — POLYETHYLENE GLYCOL 3350 17 G PO PACK: 17.0000 g | PACK | Freq: Every day | ORAL | Status: DC

## 2013-10-02 MED ORDER — OXYCODONE HCL 15 MG PO TABS: 15.0000 mg | ORAL_TABLET | ORAL | Status: DC | PRN

## 2013-10-02 NOTE — Telephone Encounter (Signed)
Per staff message and POF I have scheduled appts.  JMW  

## 2013-10-02 NOTE — Progress Notes (Signed)
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 In addition, there is moderate splenomegaly with associated hypermetabolic activity consistent with lymphomatous involvement   09/21/2013 -  Chemotherapy The patient will start on cycle 1 of bendamustine and rituximab    INTERVAL HISTORY: Manuel [name] 73 y.o. male returns for further followup. Last week, I was informed the patient have an acute gout attack. He was placed on prednisone daily. His gout felt a bit worse. The gout is affecting both ankles and big toes on both feet, worse on the right compared to the left. He also complained of ankle swelling. Apart from the pain, overall he is better. The lymphadenopathy has regressed in size. The left upper quadrant pain is improving. The patient is severely constipated. The patient still had poor appetite. However he is able to hydrate himself adequately. Denies any nausea. No recent fevers or chills. I have reviewed the past medical history, past surgical history, social history and family history with the patient and they are unchanged from previous note.  ALLERGIES:  has No Known Allergies.  MEDICATIONS:  Current Outpatient Prescriptions  Medication Sig Dispense Refill  . acyclovir (ZOVIRAX) 400 MG tablet Take 1 tablet (400 mg total) by mouth daily.  30 tablet  3  . allopurinol (ZYLOPRIM) 300 MG tablet Take 1 tablet (300 mg total) by mouth daily.   30 tablet  3  . Cholecalciferol (VITAMIN D PO) Take 2,000 Units by mouth.       . levothyroxine (SYNTHROID, LEVOTHROID) 75 MCG tablet Take 75 mcg by mouth daily.      Marland Kitchen lidocaine-prilocaine (EMLA) cream Apply 1 application topically as needed. Apply to port a cath site one hour prior to needle stick.  30 g  0  . ondansetron (ZOFRAN) 8 MG tablet Take 1 tablet (8 mg total) by mouth every 8 (eight) hours as needed for nausea.  30 tablet  1  . oxyCODONE-acetaminophen (ROXICET) 5-325 MG per tablet Take 1 tablet by mouth every 4 (four) hours as needed.  30 tablet  0  . predniSONE (DELTASONE) 20 MG tablet Take 1 tablet (20 mg total) by mouth daily with breakfast.  10 tablet  0  . prochlorperazine (COMPAZINE) 10 MG tablet Take 1 tablet (10 mg total) by mouth every 6 (six) hours as needed (Nausea or vomiting).  30 tablet  1  . oxyCODONE (ROXICODONE) 15 MG immediate release tablet Take 1 tablet (15 mg total) by mouth every 4 (four) hours as needed for severe pain.  60 tablet  0  . polyethylene glycol (MIRALAX) packet Take 17 g by mouth daily.  30 each  0   No current facility-administered medications for this visit.   Facility-Administered Medications Ordered in Other Visits  Medication Dose Route Frequency Provider Last Rate Last Dose  . sodium chloride 0.9 % injection 10 mL  10 mL Intracatheter PRN Heath Lark, MD   10 mL at 09/21/13 1646    REVIEW OF SYSTEMS:   Constitutional: Denies fevers, chills or abnormal weight loss  Eyes: Denies blurriness of vision Ears, nose, mouth, throat, and face: Denies mucositis or sore throat Respiratory: Denies cough, dyspnea or wheezes Cardiovascular: Denies palpitation, chest discomfort or lower extremity swelling Gastrointestinal:  Denies nausea, heartburn or change in bowel habits Skin: Denies abnormal skin rashes Lymphatics: Denies new lymphadenopathy or easy bruising Neurological:Denies numbness, tingling or new weaknesses Behavioral/Psych: Mood is stable, no  new changes  All other systems were reviewed with the patient and are negative.  PHYSICAL EXAMINATION: ECOG PERFORMANCE STATUS: 1 - Symptomatic but completely ambulatory  Filed Vitals:   10/02/13 0909  BP: 139/63  Pulse: 84  Temp: 97.9 F (36.6 C)  Resp: 18   Filed Weights   10/02/13 0909  Weight: 177 lb 6.4 oz (80.468 kg)    GENERAL:alert, no distress and comfortable SKIN: skin color, texture, turgor are normal, no rashes or significant lesions EYES: normal, Conjunctiva are pink and non-injected, sclera clear OROPHARYNX:no exudate, no erythema and lips, buccal mucosa, and tongue normal  NECK: supple, thyroid normal size, non-tender, without nodularity LYMPH:  no palpable lymphadenopathy in the cervical, axillary or inguinal LUNGS: clear to auscultation and percussion with normal breathing effort HEART: regular rate & rhythm and no murmurs and no lower extremity edema ABDOMEN:abdomen soft, non-tender and normal bowel sounds Musculoskeletal:no cyanosis of digits and no clubbing. Noted swelling and redness at the ankle joint and the first toes on both feet, consistent with acute gout  NEURO: alert & oriented x 3 with fluent speech, no focal motor/sensory deficits  LABORATORY DATA:  I have reviewed the data as listed    Component Value Date/Time   NA 141 10/02/2013 0854   K 4.1 10/02/2013 0854   CO2 25 10/02/2013 0854   GLUCOSE 86 10/02/2013 0854   BUN 23.8 10/02/2013 0854   CREATININE 0.9 10/02/2013 0854   CALCIUM 10.0 10/02/2013 0854   PROT 7.8 10/02/2013 0854   ALBUMIN 3.4* 10/02/2013 0854   AST 22 10/02/2013 0854   ALT 30 10/02/2013 0854   ALKPHOS 130 10/02/2013 0854   BILITOT 0.42 10/02/2013 0854    No results found for this basename: SPEP, UPEP,  kappa and lambda light chains    Lab Results  Component Value Date   WBC 22.0* 10/02/2013   NEUTROABS 19.5* 10/02/2013   HGB 9.8* 10/02/2013   HCT 29.5* 10/02/2013   MCV 95.9 10/02/2013   PLT 306 10/02/2013      Chemistry      Component Value  Date/Time   NA 141 10/02/2013 0854   K 4.1 10/02/2013 0854   CO2 25 10/02/2013 0854   BUN 23.8 10/02/2013 0854   CREATININE 0.9 10/02/2013 0854      Component Value Date/Time   CALCIUM 10.0 10/02/2013 0854   ALKPHOS 130 10/02/2013 0854   AST 22 10/02/2013 0854   ALT 30 10/02/2013 0854   BILITOT 0.42 10/02/2013 0854    ASSESSMENT & PLAN:  #1 acute gout Continue prednisone 40 mg daily. I recommend one dose of rasburicase tomorrow and stop allopurinol. I gave him prescription of pain medicine to take as needed for pain #2 non-Hodgkin lymphoma Overall the patient appears to be responding to treatment. I recommend continue chemotherapy as scheduled. We will continue supportive care. #3 severe anemia This is likely anemia of chronic disease. The patient denies recent history of bleeding such as epistaxis, hematuria or hematochezia. He is asymptomatic from the anemia. We will observe for now.  He does not require transfusion now.  #4 severe constipation I  gave him prescription of laxatives to take as needed #5 leukocytosis This is due to prednisone. Clinically his joints do not look infected. I recommend observation only All questions were answered. The patient knows to call the clinic with any problems, questions or concerns. No barriers to learning was detected. I spent 25 minutes counseling the patient face to face. The total time spent in the appointment was 40 minutes and more than 50% was on counseling and review of test results     [facility], [place], MD 10/02/2013 1:57 PM

## 2013-10-02 NOTE — Telephone Encounter (Signed)
Gave appt calendar to pt today for lab,md and infusion for tomorrow and emailed Sharyn Lull regarding chemo

## 2013-10-03 ENCOUNTER — Ambulatory Visit (HOSPITAL_BASED_OUTPATIENT_CLINIC_OR_DEPARTMENT_OTHER): Payer: Medicare PPO

## 2013-10-03 VITALS — BP 155/68 | HR 73 | Temp 97.7°F | Resp 19

## 2013-10-03 DIAGNOSIS — C8590 Non-Hodgkin lymphoma, unspecified, unspecified site: Secondary | ICD-10-CM

## 2013-10-03 DIAGNOSIS — C8589 Other specified types of non-Hodgkin lymphoma, extranodal and solid organ sites: Secondary | ICD-10-CM

## 2013-10-03 MED ORDER — SODIUM CHLORIDE 0.9 % IJ SOLN
10.0000 mL | Freq: Once | INTRAMUSCULAR | Status: AC
  Administered 2013-10-03: 10 mL via INTRAVENOUS
  Filled 2013-10-03: qty 10

## 2013-10-03 MED ORDER — RASBURICASE 1.5 MG IV SOLR
Freq: Once | INTRAVENOUS | Status: AC
  Administered 2013-10-03: 09:00:00 via INTRAVENOUS
  Filled 2013-10-03: qty 2

## 2013-10-03 MED ORDER — HEPARIN SOD (PORK) LOCK FLUSH 100 UNIT/ML IV SOLN
500.0000 [IU] | Freq: Once | INTRAVENOUS | Status: AC
  Administered 2013-10-03: 500 [IU] via INTRAVENOUS
  Filled 2013-10-03: qty 5

## 2013-10-03 MED ORDER — SODIUM CHLORIDE 0.9 % IV SOLN
Freq: Once | INTRAVENOUS | Status: AC
  Administered 2013-10-03: 08:00:00 via INTRAVENOUS

## 2013-10-03 MED ORDER — RASBURICASE 1.5 MG IV SOLR: 3.0000 mg | Freq: Once | INTRAVENOUS | Status: AC

## 2013-10-03 NOTE — Patient Instructions (Signed)
Rasburicase Injection (Elitek) What is this medicine? RASBURICASE (ras BURE i kase) breaks down uric acid in the blood. It is used to prevent and to treat high levels of uric acid caused by cancer treatment. This medicine may be used for other purposes; ask your health care provider or pharmacist if you have questions. COMMON BRAND NAME(S): Elitek What should I tell my health care provider before I take this medicine? They need to know if you have any of these conditions: -G6PD deficiency -history of anemia -history of blood transfusion -an unusual or allergic reaction to rasburicase, yeast, mannitol, other medicines, foods, dyes, or preservatives -pregnant or trying to get pregnant -breast-feeding How should I use this medicine? This medicine is for infusion into a vein. It is given by a health care professional in a hospital or clinic setting. Talk to your pediatrician regarding the use of this medicine in children. While this drug may be prescribed for children as young as 14 month old for selected conditions, precautions do apply. Overdosage: If you think you have taken too much of this medicine contact a poison control center or emergency room at once. NOTE: This medicine is only for you. Do not share this medicine with others. What if I miss a dose? This does not apply. What may interact with this medicine? -allopurinol This list may not describe all possible interactions. Give your health care provider a list of all the medicines, herbs, non-prescription drugs, or dietary supplements you use. Also tell them if you smoke, drink alcohol, or use illegal drugs. Some items may interact with your medicine. What should I watch for while using this medicine? Your condition will be monitored carefully while you are receiving this medicine. You will need to have regular blood tests during your treatment. What side effects may I notice from receiving this medicine? Side effects that you should  report to your doctor or health care professional as soon as possible: -allergic reactions like skin rash, itching or hives, swelling of the face, lips, or tongue -blue color to lips or nailbeds -breathing problems -chest pain, tightness -fast, irregular heartbeat -feeling faint or lightheaded, falls -fever -low blood pressure -seizures -trouble passing urine or change in the amount of urine -yellowing of the eyes or skin Side effects that usually do not require medical attention (report to your doctor or health care professional if they continue or are bothersome): -constipation or diarrhea -diarrhea -headache -mouth sores -nausea, vomiting This list may not describe all possible side effects. Call your doctor for medical advice about side effects. You may report side effects to FDA at 1-800-FDA-1088. Where should I keep my medicine? This drug is given in a hospital or clinic and will not be stored at home. NOTE: This sheet is a summary. It may not cover all possible information. If you have questions about this medicine, talk to your doctor, pharmacist, or health care provider.  2014, [employer]vier/Gold Standard. (2008-03-19 14:22:08)

## 2013-10-04 ENCOUNTER — Telehealth: Payer: Self-pay | Admitting: Hematology and Oncology

## 2013-10-04 NOTE — Telephone Encounter (Signed)
Talked to pt's daughter and she is aware of appt for 3/19, they will come by and get appt calendar appt

## 2013-10-05 ENCOUNTER — Telehealth: Payer: Self-pay | Admitting: *Deleted

## 2013-10-05 ENCOUNTER — Encounter (INDEPENDENT_AMBULATORY_CARE_PROVIDER_SITE_OTHER): Payer: Self-pay

## 2013-10-05 NOTE — Telephone Encounter (Signed)
Daughter left VM w/ update on pt's condition.  His gout is greatly improved after the "infusion."  Most of the swelling is down. He still has a little swelling on right foot and his right toe is still a little sore.  Pt has not taken any oxycodone in the past day, just using tylenol now.  He took the last prednisone yesterday.

## 2013-10-06 ENCOUNTER — Telehealth: Payer: Self-pay | Admitting: *Deleted

## 2013-10-06 NOTE — Telephone Encounter (Signed)
Dau reports a friend visited with pt yesterday and then the friend found out he has shingles.  Shingles are on his shoulder and not exposed.  Pt does not think he has ever had chickenpox.  Dau asks if it is safe for this friend to visit pt again while he has shingles and if anything pt should be doing now to prevent getting chickenpox?

## 2013-10-06 NOTE — Telephone Encounter (Signed)
Informed daughter of Dr. Calton Dach reply below.  She verbalized understanding.  Dau states pt planning on going home to [facility] for a week before he has to come back for his next chemo.  Dau asks if it is ok and I informed her it is fine as long as pt is feeling better and able to drive.  She says he continues to feel better, gout has improved a lot and so he will likely go back home for a week.

## 2013-10-06 NOTE — Telephone Encounter (Signed)
Pt is already on acyclovir for prevention That friend is banned from visiting for at least 1 week I do not recommend shingles vaccine. Just preventive measures with acyclovir

## 2013-10-09 ENCOUNTER — Telehealth: Payer: Self-pay | Admitting: *Deleted

## 2013-10-09 ENCOUNTER — Other Ambulatory Visit: Payer: Self-pay | Admitting: Hematology and Oncology

## 2013-10-09 DIAGNOSIS — M109 Gout, unspecified: Secondary | ICD-10-CM

## 2013-10-09 DIAGNOSIS — C8590 Non-Hodgkin lymphoma, unspecified, unspecified site: Secondary | ICD-10-CM

## 2013-10-09 MED ORDER — PREDNISONE 20 MG PO TABS: 40.0000 mg | ORAL_TABLET | Freq: Every day | ORAL | Status: DC

## 2013-10-09 NOTE — Telephone Encounter (Signed)
Informed dau of new rx for prednisone 40 mg daily,  Continue to hold allopurinol,  Take oxycodone prn for pain.  Will make appt to see pt on Friday,  Expect call from scheduler.   Dau also reports pt had some shaking chills and night sweats last night without fever.  States similar night sweats pt had before starting chemo.  Instructed to check temp whenever pt has chills and call us if his temp is greater than 100.5 F.  Call us back if any of his symptoms worsen.  She verbalized understanding.

## 2013-10-09 NOTE — Telephone Encounter (Signed)
I will place order and e scribe prednisone 40 mg daily He will need to return for assessment Friday Thanks

## 2013-10-09 NOTE — Telephone Encounter (Signed)
Manuel [name] reports Gout is returning.  Pt started having extreme pain in left foot yesterday and having to use walker again.  He also taking 2 pain pills at a time as his pain is severe.  Manuel [name] says the gout pain seemed to come back suddenly and quickly after being better for a few days.

## 2013-10-11 ENCOUNTER — Telehealth: Payer: Self-pay | Admitting: Hematology and Oncology

## 2013-10-11 NOTE — Telephone Encounter (Signed)
, °

## 2013-10-13 ENCOUNTER — Ambulatory Visit: Payer: Medicare PPO | Admitting: Hematology and Oncology

## 2013-10-13 ENCOUNTER — Other Ambulatory Visit: Payer: Medicare PPO

## 2013-10-16 ENCOUNTER — Telehealth: Payer: Self-pay | Admitting: *Deleted

## 2013-10-16 ENCOUNTER — Other Ambulatory Visit: Payer: Self-pay | Admitting: Hematology and Oncology

## 2013-10-16 NOTE — Telephone Encounter (Signed)
Daughter asks if pt should resume his Allopurinol prior to starting next cycle of chemo on 3/19? Instructed her per Dr. Alvy Bimler for pt to not take his allopurinol until she sees him again on Thurs 3/19.  She wants to recheck his uric acid level first. She verbalized understanding.

## 2013-10-19 ENCOUNTER — Ambulatory Visit (HOSPITAL_BASED_OUTPATIENT_CLINIC_OR_DEPARTMENT_OTHER): Payer: Medicare PPO

## 2013-10-19 ENCOUNTER — Telehealth: Payer: Self-pay | Admitting: *Deleted

## 2013-10-19 ENCOUNTER — Telehealth: Payer: Self-pay | Admitting: Hematology and Oncology

## 2013-10-19 ENCOUNTER — Encounter: Payer: Self-pay | Admitting: Hematology and Oncology

## 2013-10-19 ENCOUNTER — Ambulatory Visit (HOSPITAL_BASED_OUTPATIENT_CLINIC_OR_DEPARTMENT_OTHER): Payer: Medicare PPO | Admitting: Hematology and Oncology

## 2013-10-19 VITALS — BP 150/68 | HR 65 | Temp 97.4°F | Resp 19 | Ht 75.0 in | Wt 184.0 lb

## 2013-10-19 VITALS — BP 124/60 | HR 80 | Temp 97.0°F | Resp 17

## 2013-10-19 DIAGNOSIS — C8319 Mantle cell lymphoma, extranodal and solid organ sites: Secondary | ICD-10-CM

## 2013-10-19 DIAGNOSIS — Z5112 Encounter for antineoplastic immunotherapy: Secondary | ICD-10-CM

## 2013-10-19 DIAGNOSIS — L299 Pruritus, unspecified: Secondary | ICD-10-CM

## 2013-10-19 DIAGNOSIS — C8590 Non-Hodgkin lymphoma, unspecified, unspecified site: Secondary | ICD-10-CM

## 2013-10-19 DIAGNOSIS — M109 Gout, unspecified: Secondary | ICD-10-CM

## 2013-10-19 DIAGNOSIS — D649 Anemia, unspecified: Secondary | ICD-10-CM

## 2013-10-19 DIAGNOSIS — F17200 Nicotine dependence, unspecified, uncomplicated: Secondary | ICD-10-CM

## 2013-10-19 DIAGNOSIS — Z5111 Encounter for antineoplastic chemotherapy: Secondary | ICD-10-CM

## 2013-10-19 LAB — CBC WITH DIFFERENTIAL/PLATELET
BASO%: 1 % (ref 0.0–2.0)
Basophils Absolute: 0.1 10*3/uL (ref 0.0–0.1)
EOS%: 4.7 % (ref 0.0–7.0)
Eosinophils Absolute: 0.4 10*3/uL (ref 0.0–0.5)
HCT: 35.3 % — ABNORMAL LOW (ref 38.4–49.9)
HGB: 11.6 g/dL — ABNORMAL LOW (ref 13.0–17.1)
LYMPH#: 1 10*3/uL (ref 0.9–3.3)
LYMPH%: 11.1 % — ABNORMAL LOW (ref 14.0–49.0)
MCH: 31.4 pg (ref 27.2–33.4)
MCHC: 32.9 g/dL (ref 32.0–36.0)
MCV: 95.4 fL (ref 79.3–98.0)
MONO#: 1 10*3/uL — AB (ref 0.1–0.9)
MONO%: 11 % (ref 0.0–14.0)
NEUT#: 6.6 10*3/uL — ABNORMAL HIGH (ref 1.5–6.5)
NEUT%: 72.2 % (ref 39.0–75.0)
Platelets: 190 10*3/uL (ref 140–400)
RBC: 3.7 10*6/uL — AB (ref 4.20–5.82)
RDW: 17.2 % — ABNORMAL HIGH (ref 11.0–14.6)
WBC: 9.1 10*3/uL (ref 4.0–10.3)

## 2013-10-19 LAB — COMPREHENSIVE METABOLIC PANEL (CC13)
ALT: 16 U/L (ref 0–55)
ANION GAP: 9 meq/L (ref 3–11)
AST: 12 U/L (ref 5–34)
Albumin: 3.6 g/dL (ref 3.5–5.0)
Alkaline Phosphatase: 83 U/L (ref 40–150)
BUN: 19.5 mg/dL (ref 7.0–26.0)
CO2: 25 meq/L (ref 22–29)
CREATININE: 0.9 mg/dL (ref 0.7–1.3)
Calcium: 9.9 mg/dL (ref 8.4–10.4)
Chloride: 107 mEq/L (ref 98–109)
Glucose: 90 mg/dl (ref 70–140)
Potassium: 4.2 mEq/L (ref 3.5–5.1)
SODIUM: 141 meq/L (ref 136–145)
TOTAL PROTEIN: 7.7 g/dL (ref 6.4–8.3)
Total Bilirubin: 0.25 mg/dL (ref 0.20–1.20)

## 2013-10-19 LAB — LACTATE DEHYDROGENASE (CC13): LDH: 147 U/L (ref 125–245)

## 2013-10-19 LAB — URIC ACID (CC13): Uric Acid, Serum: 7.7 mg/dl — ABNORMAL HIGH (ref 2.6–7.4)

## 2013-10-19 MED ORDER — ACETAMINOPHEN 325 MG PO TABS
ORAL_TABLET | ORAL | Status: AC
  Filled 2013-10-19: qty 2

## 2013-10-19 MED ORDER — DEXAMETHASONE SODIUM PHOSPHATE 10 MG/ML IJ SOLN
INTRAMUSCULAR | Status: AC
  Filled 2013-10-19: qty 1

## 2013-10-19 MED ORDER — ONDANSETRON 8 MG/50ML IVPB (CHCC)
8.0000 mg | Freq: Once | INTRAVENOUS | Status: AC
  Administered 2013-10-19: 8 mg via INTRAVENOUS

## 2013-10-19 MED ORDER — DIPHENHYDRAMINE HCL 25 MG PO CAPS
ORAL_CAPSULE | ORAL | Status: AC
  Filled 2013-10-19: qty 2

## 2013-10-19 MED ORDER — SODIUM CHLORIDE 0.9 % IV SOLN
Freq: Once | INTRAVENOUS | Status: AC
  Administered 2013-10-19: 10:00:00 via INTRAVENOUS

## 2013-10-19 MED ORDER — RITUXIMAB CHEMO INJECTION 10 MG/ML
375.0000 mg/m2 | Freq: Once | INTRAVENOUS | Status: AC
  Administered 2013-10-19: 800 mg via INTRAVENOUS
  Filled 2013-10-19: qty 80

## 2013-10-19 MED ORDER — DEXAMETHASONE SODIUM PHOSPHATE 10 MG/ML IJ SOLN
10.0000 mg | Freq: Once | INTRAMUSCULAR | Status: AC
  Administered 2013-10-19: 10 mg via INTRAVENOUS

## 2013-10-19 MED ORDER — ONDANSETRON 8 MG/NS 50 ML IVPB
INTRAVENOUS | Status: AC
  Filled 2013-10-19: qty 8

## 2013-10-19 MED ORDER — HEPARIN SOD (PORK) LOCK FLUSH 100 UNIT/ML IV SOLN
500.0000 [IU] | Freq: Once | INTRAVENOUS | Status: AC | PRN
  Administered 2013-10-19: 500 [IU]
  Filled 2013-10-19: qty 5

## 2013-10-19 MED ORDER — ACETAMINOPHEN 325 MG PO TABS
650.0000 mg | ORAL_TABLET | Freq: Once | ORAL | Status: AC
  Administered 2013-10-19: 650 mg via ORAL

## 2013-10-19 MED ORDER — SODIUM CHLORIDE 0.9 % IJ SOLN
10.0000 mL | INTRAMUSCULAR | Status: DC | PRN
  Administered 2013-10-19: 10 mL
  Filled 2013-10-19: qty 10

## 2013-10-19 MED ORDER — SODIUM CHLORIDE 0.9 % IV SOLN
90.0000 mg/m2 | Freq: Once | INTRAVENOUS | Status: AC
  Administered 2013-10-19: 189 mg via INTRAVENOUS
  Filled 2013-10-19: qty 2.1

## 2013-10-19 MED ORDER — DIPHENHYDRAMINE HCL 25 MG PO CAPS
50.0000 mg | ORAL_CAPSULE | Freq: Once | ORAL | Status: AC
  Administered 2013-10-19: 50 mg via ORAL

## 2013-10-19 NOTE — Telephone Encounter (Signed)
Per staff message and POF I have scheduled appts.  JMW  

## 2013-10-19 NOTE — Telephone Encounter (Signed)
Gave pt appt for lab ,MD and chemo for MArch, April  2015 emailed michelle regrding chemo for April

## 2013-10-19 NOTE — Progress Notes (Signed)
Patient reports having chills with last rituxan infusion. Infusion started slowly as 1st time dose. At 1315- Patient has tolerated Rituxan infusion w/o difficulty and has two bump ups left- 145 ml/hr and 165 ml/hr. Increased infusion to 165 ml/hr x 83 ml to see if patient could tolerate. Patient did well, no reactions this infusion.

## 2013-10-19 NOTE — Patient Instructions (Signed)
[facility] Discharge Instructions for Patients Receiving Chemotherapy   Today you received the following chemotherapy agents :  Rituxan,  Treanda.  To help prevent nausea and vomiting after your treatment, we encourage you to take your nausea medication as prescribed by your physician.   If you develop nausea and vomiting that is not controlled by your nausea medication, call the clinic.   BELOW ARE SYMPTOMS THAT SHOULD BE REPORTED IMMEDIATELY:  *FEVER GREATER THAN 100.5 F  *CHILLS WITH OR WITHOUT FEVER  NAUSEA AND VOMITING THAT IS NOT CONTROLLED WITH YOUR NAUSEA MEDICATION  *UNUSUAL SHORTNESS OF BREATH  *UNUSUAL BRUISING OR BLEEDING  TENDERNESS IN MOUTH AND THROAT WITH OR WITHOUT PRESENCE OF ULCERS  *URINARY PROBLEMS  *BOWEL PROBLEMS  UNUSUAL RASH Items with * indicate a potential emergency and should be followed up as soon as possible.  Feel free to call the clinic you have any questions or concerns. The clinic phone number is [phone].    

## 2013-10-19 NOTE — Progress Notes (Signed)
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 In addition, there is moderate splenomegaly with associated hypermetabolic activity consistent with lymphomatous involvement   09/21/2013 -  Chemotherapy The patient will start on cycle 1 of bendamustine and rituximab    INTERVAL HISTORY: Manuel [name] 73 y.o. male returns for further followup. His gout pain is resolving. All his lymph nodes had regressed in size. His splenomegaly and left upper quadrant pain had resolved. He denies constipation. He denies nausea. His appetite has improved and he has gained 7 pounds of weight. This week, he started to have skin itching again. I have reviewed the past medical history, past surgical history, social history and family history with the patient and they are unchanged from previous note.  ALLERGIES:  has No Known Allergies.  MEDICATIONS:  Current Outpatient Prescriptions  Medication Sig Dispense Refill  . acyclovir (ZOVIRAX) 400 MG tablet Take 1 tablet (400 mg total) by mouth daily.  30 tablet  3  . Cholecalciferol (VITAMIN D PO) Take 2,000 Units by mouth.       . levothyroxine (SYNTHROID, LEVOTHROID) 75 MCG tablet Take 75 mcg by mouth daily.      . ondansetron (ZOFRAN) 8 MG tablet Take 1 tablet (8 mg total) by mouth every 8 (eight) hours as needed for nausea.  30 tablet  1  . oxyCODONE-acetaminophen (ROXICET) 5-325 MG per tablet Take 1 tablet  by mouth every 4 (four) hours as needed.  30 tablet  0  . predniSONE (DELTASONE) 20 MG tablet Take 2 tablets (40 mg total) by mouth daily with breakfast.  60 tablet  0  . prochlorperazine (COMPAZINE) 10 MG tablet Take 1 tablet (10 mg total) by mouth every 6 (six) hours as needed (Nausea or vomiting).  30 tablet  1  . allopurinol (ZYLOPRIM) 300 MG tablet Take 1 tablet (300 mg total) by mouth daily.  30 tablet  3  . lidocaine-prilocaine (EMLA) cream Apply 1 application topically as needed. Apply to port a cath site one hour prior to needle stick.  30 g  0  . oxyCODONE (ROXICODONE) 15 MG immediate release tablet Take 1 tablet (15 mg total) by mouth every 4 (four) hours as needed for severe pain.  60 tablet  0  . polyethylene glycol (MIRALAX) packet Take 17 g by mouth daily.  30 each  0   No current facility-administered medications for this visit.   Facility-Administered Medications Ordered in Other Visits  Medication Dose Route Frequency Provider Last Rate Last Dose  . sodium chloride 0.9 % injection 10 mL  10 mL Intracatheter PRN Heath Lark, MD   10 mL at 09/21/13 1646    REVIEW OF SYSTEMS:   Constitutional: Denies fevers, chills or abnormal weight loss Eyes: Denies blurriness of vision Ears, nose, mouth, throat, and face: Denies mucositis or sore throat Respiratory: Denies cough, dyspnea or wheezes Cardiovascular: Denies palpitation, chest discomfort or lower extremity swelling Gastrointestinal:  Denies nausea, heartburn or change in bowel habits Skin: Denies abnormal skin rashes Lymphatics:  Denies new lymphadenopathy or easy bruising Neurological:Denies numbness, tingling or new weaknesses Behavioral/Psych: Mood is stable, no new changes  All other systems were reviewed with the patient and are negative.  PHYSICAL EXAMINATION: ECOG PERFORMANCE STATUS: 0 - Asymptomatic  Filed Vitals:   10/19/13 0851  BP: 150/68  Pulse: 65  Temp: 97.4 F (36.3 C)  Resp: 19   Filed Weights    10/19/13 0851  Weight: 184 lb (83.462 kg)    GENERAL:alert, no distress and comfortable SKIN: skin color, texture, turgor are normal, no rashes or significant lesions EYES: normal, Conjunctiva are pink and non-injected, sclera clear OROPHARYNX:no exudate, no erythema and lips, buccal mucosa, and tongue normal  NECK: supple, thyroid normal size, non-tender, without nodularity LYMPH:  All his previously palpable lymphadenopathy had regressed in size. LUNGS: clear to auscultation and percussion with normal breathing effort HEART: regular rate & rhythm and no murmurs and no lower extremity edema ABDOMEN:abdomen soft, non-tender and normal bowel sounds. Previously palpable splenomegaly is now nonpalpable Musculoskeletal:no cyanosis of digits and no clubbing  NEURO: alert & oriented x 3 with fluent speech, no focal motor/sensory deficits  LABORATORY DATA:  I have reviewed the data as listed    Component Value Date/Time   NA 141 10/02/2013 0854   K 4.1 10/02/2013 0854   CO2 25 10/02/2013 0854   GLUCOSE 86 10/02/2013 0854   BUN 23.8 10/02/2013 0854   CREATININE 0.9 10/02/2013 0854   CALCIUM 10.0 10/02/2013 0854   PROT 7.8 10/02/2013 0854   ALBUMIN 3.4* 10/02/2013 0854   AST 22 10/02/2013 0854   ALT 30 10/02/2013 0854   ALKPHOS 130 10/02/2013 0854   BILITOT 0.42 10/02/2013 0854    No results found for this basename: SPEP, UPEP,  kappa and lambda light chains    Lab Results  Component Value Date   WBC 9.1 10/19/2013   NEUTROABS 6.6* 10/19/2013   HGB 11.6* 10/19/2013   HCT 35.3* 10/19/2013   MCV 95.4 10/19/2013   PLT 190 10/19/2013      Chemistry      Component Value Date/Time   NA 141 10/02/2013 0854   K 4.1 10/02/2013 0854   CO2 25 10/02/2013 0854   BUN 23.8 10/02/2013 0854   CREATININE 0.9 10/02/2013 0854      Component Value Date/Time   CALCIUM 10.0 10/02/2013 0854   ALKPHOS 130 10/02/2013 0854   AST 22 10/02/2013 0854   ALT 30 10/02/2013 0854   BILITOT 0.42 10/02/2013 0854     ASSESSMENT & PLAN:  #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 Over impression is the patient had mantle cell lymphoma but he will contact another hematopathologist for further discussion I will proceed with cycle 2 of treatment. I am concern about the skin itching and I felt that his disease being so aggressive is returning. Instead of each cycle of chemotherapy being 4 weeks, I plan on moving his treatment to every 3 weeks. #2 anemia This is likely anemia of chronic disease. The patient denies recent history of bleeding such as epistaxis, hematuria or hematochezia. He is asymptomatic from the anemia. We will observe for now.  He does not require transfusion now.  It is improving overall. #3 recurrent gout It is improving. I recommended he restart allopurinol #4 tobacco abuse The patient is not interested to quit smoking #5 Skin itching This is likely related to disease. As mentioned above I'm changing his treatment plan to every 3 weeks instead of every 4 weeks.  Orders Placed This Encounter  Procedures  . Lactate dehydrogenase    Standing Status: Future     Number of Occurrences:      Standing Expiration Date: 10/19/2014  . Uric Acid    Standing Status: Future     Number of Occurrences:      Standing Expiration Date: 10/19/2014   All questions were answered. The patient knows to call the clinic with any problems, questions or concerns. No barriers to learning was detected. I spent 40 minutes counseling the patient face to face. The total time spent in the appointment was 55 minutes and more than 50% was on counseling and review of test results     [facility], [place], MD 10/19/2013 9:23 AM

## 2013-10-20 ENCOUNTER — Ambulatory Visit (HOSPITAL_BASED_OUTPATIENT_CLINIC_OR_DEPARTMENT_OTHER): Payer: Medicare PPO

## 2013-10-20 VITALS — BP 176/84 | HR 77 | Temp 97.8°F | Resp 20

## 2013-10-20 DIAGNOSIS — C8590 Non-Hodgkin lymphoma, unspecified, unspecified site: Secondary | ICD-10-CM

## 2013-10-20 DIAGNOSIS — C8589 Other specified types of non-Hodgkin lymphoma, extranodal and solid organ sites: Secondary | ICD-10-CM

## 2013-10-20 DIAGNOSIS — Z5111 Encounter for antineoplastic chemotherapy: Secondary | ICD-10-CM

## 2013-10-20 MED ORDER — SODIUM CHLORIDE 0.9 % IV SOLN
Freq: Once | INTRAVENOUS | Status: AC
  Administered 2013-10-20: 10:00:00 via INTRAVENOUS

## 2013-10-20 MED ORDER — ONDANSETRON 8 MG/NS 50 ML IVPB
INTRAVENOUS | Status: AC
  Filled 2013-10-20: qty 8

## 2013-10-20 MED ORDER — DEXAMETHASONE SODIUM PHOSPHATE 10 MG/ML IJ SOLN
INTRAMUSCULAR | Status: AC
  Filled 2013-10-20: qty 1

## 2013-10-20 MED ORDER — SODIUM CHLORIDE 0.9 % IJ SOLN
10.0000 mL | INTRAMUSCULAR | Status: DC | PRN
  Administered 2013-10-20: 10 mL
  Filled 2013-10-20: qty 10

## 2013-10-20 MED ORDER — HEPARIN SOD (PORK) LOCK FLUSH 100 UNIT/ML IV SOLN
500.0000 [IU] | Freq: Once | INTRAVENOUS | Status: AC | PRN
  Administered 2013-10-20: 500 [IU]
  Filled 2013-10-20: qty 5

## 2013-10-20 MED ORDER — SODIUM CHLORIDE 0.9 % IV SOLN
90.0000 mg/m2 | Freq: Once | INTRAVENOUS | Status: AC
  Administered 2013-10-20: 189 mg via INTRAVENOUS
  Filled 2013-10-20: qty 2.1

## 2013-10-20 MED ORDER — DEXAMETHASONE SODIUM PHOSPHATE 10 MG/ML IJ SOLN
10.0000 mg | Freq: Once | INTRAMUSCULAR | Status: AC
  Administered 2013-10-20: 10 mg via INTRAVENOUS

## 2013-10-20 MED ORDER — ONDANSETRON 8 MG/50ML IVPB (CHCC)
8.0000 mg | Freq: Once | INTRAVENOUS | Status: AC
  Administered 2013-10-20: 8 mg via INTRAVENOUS

## 2013-10-20 NOTE — Patient Instructions (Signed)
[facility] Discharge Instructions for Patients Receiving Chemotherapy  Today you received the following chemotherapy agents Treanda. To help prevent nausea and vomiting after your treatment, we encourage you to take your nausea medication as directed.    If you develop nausea and vomiting that is not controlled by your nausea medication, call the clinic.   BELOW ARE SYMPTOMS THAT SHOULD BE REPORTED IMMEDIATELY:  *FEVER GREATER THAN 100.5 F  *CHILLS WITH OR WITHOUT FEVER  NAUSEA AND VOMITING THAT IS NOT CONTROLLED WITH YOUR NAUSEA MEDICATION  *UNUSUAL SHORTNESS OF BREATH  *UNUSUAL BRUISING OR BLEEDING  TENDERNESS IN MOUTH AND THROAT WITH OR WITHOUT PRESENCE OF ULCERS  *URINARY PROBLEMS  *BOWEL PROBLEMS  UNUSUAL RASH Items with * indicate a potential emergency and should be followed up as soon as possible.  Feel free to call the clinic you have any questions or concerns. The clinic phone number is [phone].    

## 2013-10-21 ENCOUNTER — Ambulatory Visit (HOSPITAL_BASED_OUTPATIENT_CLINIC_OR_DEPARTMENT_OTHER): Payer: Medicare PPO

## 2013-10-21 VITALS — BP 168/85 | HR 77 | Temp 97.4°F | Resp 20

## 2013-10-21 DIAGNOSIS — C8589 Other specified types of non-Hodgkin lymphoma, extranodal and solid organ sites: Secondary | ICD-10-CM

## 2013-10-21 DIAGNOSIS — Z5189 Encounter for other specified aftercare: Secondary | ICD-10-CM

## 2013-10-21 MED ORDER — PEGFILGRASTIM INJECTION 6 MG/0.6ML
6.0000 mg | Freq: Once | SUBCUTANEOUS | Status: AC
  Administered 2013-10-21: 6 mg via SUBCUTANEOUS

## 2013-10-23 ENCOUNTER — Other Ambulatory Visit: Payer: Self-pay | Admitting: Hematology and Oncology

## 2013-10-23 ENCOUNTER — Telehealth: Payer: Self-pay | Admitting: Hematology and Oncology

## 2013-10-23 NOTE — Progress Notes (Signed)
Addendum to my note dated 10/19/2013. The patient had mantle cell lymphoma, a form of B cell lymphoma.

## 2013-10-23 NOTE — Progress Notes (Unsigned)
This is an addendum to my note dated 10/19/2013 The patient had B-cell lymphoma, confirmed to be mantle cell lymphoma of the discussion with the Hematopathologist.

## 2013-10-23 NOTE — Telephone Encounter (Signed)
, °

## 2013-10-26 ENCOUNTER — Other Ambulatory Visit (HOSPITAL_BASED_OUTPATIENT_CLINIC_OR_DEPARTMENT_OTHER): Payer: Medicare PPO

## 2013-10-26 ENCOUNTER — Telehealth: Payer: Self-pay | Admitting: *Deleted

## 2013-10-26 ENCOUNTER — Telehealth: Payer: Self-pay | Admitting: Hematology and Oncology

## 2013-10-26 ENCOUNTER — Encounter: Payer: Self-pay | Admitting: Hematology and Oncology

## 2013-10-26 ENCOUNTER — Ambulatory Visit (HOSPITAL_BASED_OUTPATIENT_CLINIC_OR_DEPARTMENT_OTHER): Payer: Medicare PPO | Admitting: Hematology and Oncology

## 2013-10-26 VITALS — BP 144/70 | HR 90 | Temp 98.2°F | Resp 19 | Ht 75.0 in | Wt 187.9 lb

## 2013-10-26 DIAGNOSIS — C8590 Non-Hodgkin lymphoma, unspecified, unspecified site: Secondary | ICD-10-CM

## 2013-10-26 DIAGNOSIS — C8319 Mantle cell lymphoma, extranodal and solid organ sites: Secondary | ICD-10-CM

## 2013-10-26 DIAGNOSIS — M109 Gout, unspecified: Secondary | ICD-10-CM

## 2013-10-26 LAB — LACTATE DEHYDROGENASE (CC13): LDH: 240 U/L (ref 125–245)

## 2013-10-26 LAB — COMPREHENSIVE METABOLIC PANEL (CC13)
ALK PHOS: 137 U/L (ref 40–150)
ALT: 16 U/L (ref 0–55)
AST: 11 U/L (ref 5–34)
Albumin: 3.7 g/dL (ref 3.5–5.0)
Anion Gap: 15 mEq/L — ABNORMAL HIGH (ref 3–11)
BILIRUBIN TOTAL: 0.33 mg/dL (ref 0.20–1.20)
BUN: 20.4 mg/dL (ref 7.0–26.0)
CO2: 24 mEq/L (ref 22–29)
Calcium: 10.4 mg/dL (ref 8.4–10.4)
Chloride: 103 mEq/L (ref 98–109)
Creatinine: 1.1 mg/dL (ref 0.7–1.3)
GLUCOSE: 99 mg/dL (ref 70–140)
Potassium: 3.5 mEq/L (ref 3.5–5.1)
Sodium: 141 mEq/L (ref 136–145)
Total Protein: 7.6 g/dL (ref 6.4–8.3)

## 2013-10-26 LAB — CBC WITH DIFFERENTIAL/PLATELET
BASO%: 0.5 % (ref 0.0–2.0)
Basophils Absolute: 0.1 10*3/uL (ref 0.0–0.1)
EOS ABS: 0.6 10*3/uL — AB (ref 0.0–0.5)
EOS%: 2.1 % (ref 0.0–7.0)
HCT: 37 % — ABNORMAL LOW (ref 38.4–49.9)
HGB: 11.9 g/dL — ABNORMAL LOW (ref 13.0–17.1)
LYMPH%: 0.8 % — AB (ref 14.0–49.0)
MCH: 30.9 pg (ref 27.2–33.4)
MCHC: 32.2 g/dL (ref 32.0–36.0)
MCV: 96 fL (ref 79.3–98.0)
MONO#: 2 10*3/uL — ABNORMAL HIGH (ref 0.1–0.9)
MONO%: 7.7 % (ref 0.0–14.0)
NEUT#: 23.4 10*3/uL — ABNORMAL HIGH (ref 1.5–6.5)
NEUT%: 88.9 % — ABNORMAL HIGH (ref 39.0–75.0)
PLATELETS: 185 10*3/uL (ref 140–400)
RBC: 3.86 10*6/uL — ABNORMAL LOW (ref 4.20–5.82)
RDW: 18.5 % — ABNORMAL HIGH (ref 11.0–14.6)
WBC: 26.3 10*3/uL — ABNORMAL HIGH (ref 4.0–10.3)
lymph#: 0.2 10*3/uL — ABNORMAL LOW (ref 0.9–3.3)

## 2013-10-26 LAB — URIC ACID (CC13): Uric Acid, Serum: 6.4 mg/dl (ref 2.6–7.4)

## 2013-10-26 MED ORDER — PREDNISONE 20 MG PO TABS: 40.0000 mg | ORAL_TABLET | Freq: Every day | ORAL | Status: DC

## 2013-10-26 NOTE — Telephone Encounter (Signed)
pt MD add appt today and pt is already aware

## 2013-10-26 NOTE — Telephone Encounter (Signed)
Dau left VM states pt's gout is worse despite pt taking 40 mg prednisone daily for past 3 days.  He is having to use walker again.  Informed Dr. Alvy Bimler and she instructs for pt to be seen in office today w/ lab.  Instructed daughter to bring pt in as soon as they can get here this morning.  She verbalized understanding.

## 2013-10-26 NOTE — Telephone Encounter (Signed)
inf for 3/27 added - appt for f/u 3/27 cxd as pt was seen today. gv pt dtr appt schedule for march/april.

## 2013-10-27 ENCOUNTER — Ambulatory Visit: Payer: Medicare PPO

## 2013-10-27 ENCOUNTER — Ambulatory Visit: Payer: Medicare PPO | Admitting: Hematology and Oncology

## 2013-10-27 NOTE — Progress Notes (Signed)
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 In addition, there is moderate splenomegaly with associated hypermetabolic activity consistent with lymphomatous involvement   09/21/2013 -  Chemotherapy The patient will start on cycle 1 of bendamustine and rituximab    INTERVAL HISTORY: Manuel [name] 73 y.o. male returns for further followup. He complained of severe gout attack in both feet. He has been taking prednisone 40 mg daily but not using a lot of oxycodone. His appetite continue to improve and he is still gaining weight. Denies any recent constipation.  I have reviewed the past medical history, past surgical history, social history and family history with the patient and they are unchanged from previous note.  ALLERGIES:  has No Known Allergies.  MEDICATIONS:  Current Outpatient Prescriptions  Medication Sig Dispense Refill  . acyclovir (ZOVIRAX) 400 MG tablet Take 1 tablet (400 mg total) by mouth daily.  30 tablet  3  . allopurinol (ZYLOPRIM) 300 MG tablet Take 1 tablet (300 mg total) by mouth daily.  30 tablet  3  . Cholecalciferol (VITAMIN D PO) Take 2,000 Units by mouth.       . levothyroxine (SYNTHROID, LEVOTHROID) 75 MCG tablet Take 75 mcg by mouth daily.      Marland Kitchen lidocaine-prilocaine (EMLA) cream Apply 1 application topically as needed. Apply to port a cath site one hour prior to needle stick.  30 g  0  . ondansetron (ZOFRAN) 8 MG tablet Take 1  tablet (8 mg total) by mouth every 8 (eight) hours as needed for nausea.  30 tablet  1  . oxyCODONE (ROXICODONE) 15 MG immediate release tablet Take 1 tablet (15 mg total) by mouth every 4 (four) hours as needed for severe pain.  60 tablet  0  . oxyCODONE-acetaminophen (ROXICET) 5-325 MG per tablet Take 1 tablet by mouth every 4 (four) hours as needed.  30 tablet  0  . polyethylene glycol (MIRALAX) packet Take 17 g by mouth daily.  30 each  0  . predniSONE (DELTASONE) 20 MG tablet Take 2 tablets (40 mg total) by mouth daily with breakfast.  60 tablet  0  . prochlorperazine (COMPAZINE) 10 MG tablet Take 1 tablet (10 mg total) by mouth every 6 (six) hours as needed (Nausea or vomiting).  30 tablet  1   No current facility-administered medications for this visit.   Facility-Administered Medications Ordered in Other Visits  Medication Dose Route Frequency Provider Last Rate Last Dose  . sodium chloride 0.9 % injection 10 mL  10 mL Intracatheter PRN Heath Lark, MD   10 mL at 09/21/13 1646    REVIEW OF SYSTEMS:   All other systems were reviewed with the patient and are negative.  PHYSICAL EXAMINATION: ECOG PERFORMANCE STATUS: 1 - Symptomatic but completely ambulatory  Filed Vitals:   10/26/13 1018  BP: 144/70  Pulse: 90  Temp: 98.2 F (36.8 C)  Resp: 19   Filed Weights   10/26/13 1018  Weight: 187 lb 14.4 oz (85.231 kg)    GENERAL:alert, no distress  and comfortable SKIN: skin color, texture, turgor are normal, no rashes or significant lesions Musculoskeletal:no cyanosis of digits and no clubbing. Significant swelling of the left big toe at the first metatarsal joint consistent with acute gout attack. NEURO: alert & oriented x 3 with fluent speech, no focal motor/sensory deficits  LABORATORY DATA:  I have reviewed the data as listed    Component Value Date/Time   NA 141 10/26/2013 0956   K 3.5 10/26/2013 0956   CO2 24 10/26/2013 0956   GLUCOSE 99 10/26/2013 0956   BUN 20.4  10/26/2013 0956   CREATININE 1.1 10/26/2013 0956   CALCIUM 10.4 10/26/2013 0956   PROT 7.6 10/26/2013 0956   ALBUMIN 3.7 10/26/2013 0956   AST 11 10/26/2013 0956   ALT 16 10/26/2013 0956   ALKPHOS 137 10/26/2013 0956   BILITOT 0.33 10/26/2013 0956    No results found for this basename: SPEP, UPEP,  kappa and lambda light chains    Lab Results  Component Value Date   WBC 26.3* 10/26/2013   NEUTROABS 23.4* 10/26/2013   HGB 11.9* 10/26/2013   HCT 37.0* 10/26/2013   MCV 96.0 10/26/2013   PLT 185 10/26/2013      Chemistry      Component Value Date/Time   NA 141 10/26/2013 0956   K 3.5 10/26/2013 0956   CO2 24 10/26/2013 0956   BUN 20.4 10/26/2013 0956   CREATININE 1.1 10/26/2013 0956      Component Value Date/Time   CALCIUM 10.4 10/26/2013 0956   ALKPHOS 137 10/26/2013 0956   AST 11 10/26/2013 0956   ALT 16 10/26/2013 0956   BILITOT 0.33 10/26/2013 0956     ASSESSMENT & PLAN:  #1 recurrent gout attack I told him to discontinue allopurinol. Repeat serum uric acid level is actually improved compared to last week. I recommend he continue on prednisone at 40 mg daily and to take round-the-clock pain medicine as needed. I have informed his daughter to call me early next week about his progress. #2 mental cell lymphoma He has no evidence of tumor lysis syndrome. Continue supportive care.  All questions were answered. The patient knows to call the clinic with any problems, questions or concerns. No barriers to learning was detected. I spent 15 minutes counseling the patient face to face. The total time spent in the appointment was 20 minutes and more than 50% was on counseling and review of test results     [facility], [place], MD 10/27/2013 8:09 AM

## 2013-10-30 ENCOUNTER — Telehealth: Payer: Self-pay | Admitting: *Deleted

## 2013-10-30 NOTE — Telephone Encounter (Signed)
Dau left VM to inform Dr. Alvy Bimler that pt's Gout is "much better."  He still has some tenderness in big toe and she gave him One prednisone yesterday, one today and the last one will be tomorrow.  Pt is actually planning on going back home to the [place] today and return prior to next appt..   She also reports pt wants to have Consult with Transplant Specialist.  Dau asks for this to be arranged.  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  She worries that pt lets his dog lick his fork and he often licks his own fingers before getting money out of his wallet.  She wanted to let Dr. Alvy Bimler know about this.

## 2013-10-30 NOTE — Telephone Encounter (Signed)
Ok

## 2013-10-31 ENCOUNTER — Telehealth: Payer: Self-pay | Admitting: *Deleted

## 2013-10-31 NOTE — Telephone Encounter (Signed)
Dau called to ask if ok for her to get baby chickens which they keep in the garage?  She is worried about the germs pt may be exposed.  Per Dr. Alvy Bimler it is ok to get chickens.  Informed dau of this and she verbalized understanding.

## 2013-11-02 ENCOUNTER — Telehealth: Payer: Self-pay | Admitting: Hematology and Oncology

## 2013-11-02 NOTE — Telephone Encounter (Signed)
Pt appt to see Dr. Shirleen Schirmer @ [facility] is 11/16/13@2 :79. Medical records faxed,slides and scans will be fedex'ed. pts dtr is aware

## 2013-11-08 ENCOUNTER — Other Ambulatory Visit: Payer: Self-pay | Admitting: Hematology and Oncology

## 2013-11-08 ENCOUNTER — Other Ambulatory Visit: Payer: Self-pay

## 2013-11-09 ENCOUNTER — Other Ambulatory Visit: Payer: Self-pay

## 2013-11-09 ENCOUNTER — Telehealth: Payer: Self-pay | Admitting: Hematology and Oncology

## 2013-11-09 ENCOUNTER — Encounter: Payer: Self-pay | Admitting: Hematology and Oncology

## 2013-11-09 ENCOUNTER — Other Ambulatory Visit: Payer: Self-pay | Admitting: Hematology and Oncology

## 2013-11-09 ENCOUNTER — Ambulatory Visit (HOSPITAL_BASED_OUTPATIENT_CLINIC_OR_DEPARTMENT_OTHER): Payer: Medicare PPO

## 2013-11-09 ENCOUNTER — Other Ambulatory Visit (HOSPITAL_BASED_OUTPATIENT_CLINIC_OR_DEPARTMENT_OTHER): Payer: Medicare PPO

## 2013-11-09 ENCOUNTER — Ambulatory Visit (HOSPITAL_BASED_OUTPATIENT_CLINIC_OR_DEPARTMENT_OTHER): Payer: Medicare PPO | Admitting: Hematology and Oncology

## 2013-11-09 VITALS — BP 133/63 | HR 81 | Temp 98.3°F | Resp 20

## 2013-11-09 VITALS — BP 150/68 | HR 74 | Temp 97.9°F | Resp 18 | Ht 75.0 in | Wt 194.9 lb

## 2013-11-09 DIAGNOSIS — C8590 Non-Hodgkin lymphoma, unspecified, unspecified site: Secondary | ICD-10-CM

## 2013-11-09 DIAGNOSIS — C8319 Mantle cell lymphoma, extranodal and solid organ sites: Secondary | ICD-10-CM

## 2013-11-09 DIAGNOSIS — C8310 Mantle cell lymphoma, unspecified site: Secondary | ICD-10-CM

## 2013-11-09 DIAGNOSIS — M109 Gout, unspecified: Secondary | ICD-10-CM

## 2013-11-09 DIAGNOSIS — Z5112 Encounter for antineoplastic immunotherapy: Secondary | ICD-10-CM

## 2013-11-09 LAB — CBC WITH DIFFERENTIAL/PLATELET
BASO%: 1.1 % (ref 0.0–2.0)
BASOS ABS: 0.1 10*3/uL (ref 0.0–0.1)
EOS ABS: 0.3 10*3/uL (ref 0.0–0.5)
EOS%: 2.4 % (ref 0.0–7.0)
HCT: 38.4 % (ref 38.4–49.9)
HEMOGLOBIN: 12.5 g/dL — AB (ref 13.0–17.1)
LYMPH%: 4.8 % — ABNORMAL LOW (ref 14.0–49.0)
MCH: 30.9 pg (ref 27.2–33.4)
MCHC: 32.6 g/dL (ref 32.0–36.0)
MCV: 94.8 fL (ref 79.3–98.0)
MONO#: 1.3 10*3/uL — AB (ref 0.1–0.9)
MONO%: 12.6 % (ref 0.0–14.0)
NEUT%: 79.1 % — ABNORMAL HIGH (ref 39.0–75.0)
NEUTROS ABS: 8.1 10*3/uL — AB (ref 1.5–6.5)
Platelets: 238 10*3/uL (ref 140–400)
RBC: 4.05 10*6/uL — ABNORMAL LOW (ref 4.20–5.82)
RDW: 16.1 % — AB (ref 11.0–14.6)
WBC: 10.3 10*3/uL (ref 4.0–10.3)
lymph#: 0.5 10*3/uL — ABNORMAL LOW (ref 0.9–3.3)

## 2013-11-09 LAB — COMPREHENSIVE METABOLIC PANEL (CC13)
ALBUMIN: 3.7 g/dL (ref 3.5–5.0)
ALT: 15 U/L (ref 0–55)
AST: 12 U/L (ref 5–34)
Alkaline Phosphatase: 85 U/L (ref 40–150)
Anion Gap: 8 mEq/L (ref 3–11)
BUN: 18.3 mg/dL (ref 7.0–26.0)
CALCIUM: 9.6 mg/dL (ref 8.4–10.4)
CHLORIDE: 107 meq/L (ref 98–109)
CO2: 28 mEq/L (ref 22–29)
Creatinine: 1 mg/dL (ref 0.7–1.3)
GLUCOSE: 76 mg/dL (ref 70–140)
POTASSIUM: 3.8 meq/L (ref 3.5–5.1)
Sodium: 143 mEq/L (ref 136–145)
Total Bilirubin: 0.27 mg/dL (ref 0.20–1.20)
Total Protein: 7 g/dL (ref 6.4–8.3)

## 2013-11-09 MED ORDER — ACETAMINOPHEN 325 MG PO TABS
ORAL_TABLET | ORAL | Status: AC
  Filled 2013-11-09: qty 2

## 2013-11-09 MED ORDER — ACETAMINOPHEN 325 MG PO TABS
650.0000 mg | ORAL_TABLET | Freq: Once | ORAL | Status: AC
  Administered 2013-11-09: 650 mg via ORAL

## 2013-11-09 MED ORDER — SODIUM CHLORIDE 0.9 % IV SOLN
90.0000 mg/m2 | Freq: Once | INTRAVENOUS | Status: AC
  Administered 2013-11-09: 189 mg via INTRAVENOUS
  Filled 2013-11-09: qty 2.1

## 2013-11-09 MED ORDER — DIPHENHYDRAMINE HCL 25 MG PO CAPS
ORAL_CAPSULE | ORAL | Status: AC
  Filled 2013-11-09: qty 2

## 2013-11-09 MED ORDER — DIPHENHYDRAMINE HCL 25 MG PO CAPS
50.0000 mg | ORAL_CAPSULE | Freq: Once | ORAL | Status: AC
  Administered 2013-11-09: 50 mg via ORAL

## 2013-11-09 MED ORDER — SODIUM CHLORIDE 0.9 % IV SOLN
375.0000 mg/m2 | Freq: Once | INTRAVENOUS | Status: AC
  Administered 2013-11-09: 800 mg via INTRAVENOUS
  Filled 2013-11-09: qty 80

## 2013-11-09 MED ORDER — HEPARIN SOD (PORK) LOCK FLUSH 100 UNIT/ML IV SOLN
500.0000 [IU] | Freq: Once | INTRAVENOUS | Status: AC | PRN
  Administered 2013-11-09: 500 [IU]
  Filled 2013-11-09: qty 5

## 2013-11-09 MED ORDER — ONDANSETRON 8 MG/NS 50 ML IVPB
INTRAVENOUS | Status: AC
  Filled 2013-11-09: qty 8

## 2013-11-09 MED ORDER — SODIUM CHLORIDE 0.9 % IJ SOLN
10.0000 mL | INTRAMUSCULAR | Status: DC | PRN
  Administered 2013-11-09: 10 mL
  Filled 2013-11-09: qty 10

## 2013-11-09 MED ORDER — ACETAMINOPHEN 325 MG PO TABS
ORAL_TABLET | ORAL | Status: AC
  Filled 2013-11-09: qty 1

## 2013-11-09 MED ORDER — ONDANSETRON 8 MG/50ML IVPB (CHCC)
8.0000 mg | Freq: Once | INTRAVENOUS | Status: AC
  Administered 2013-11-09: 8 mg via INTRAVENOUS

## 2013-11-09 MED ORDER — SODIUM CHLORIDE 0.9 % IV SOLN
Freq: Once | INTRAVENOUS | Status: AC
  Administered 2013-11-09: 10:00:00 via INTRAVENOUS

## 2013-11-09 MED ORDER — DEXAMETHASONE SODIUM PHOSPHATE 10 MG/ML IJ SOLN
10.0000 mg | Freq: Once | INTRAMUSCULAR | Status: AC
  Administered 2013-11-09: 10 mg via INTRAVENOUS

## 2013-11-09 MED ORDER — DIPHENHYDRAMINE HCL 25 MG PO CAPS
ORAL_CAPSULE | ORAL | Status: AC
  Filled 2013-11-09: qty 1

## 2013-11-09 MED ORDER — DEXAMETHASONE SODIUM PHOSPHATE 10 MG/ML IJ SOLN
INTRAMUSCULAR | Status: AC
  Filled 2013-11-09: qty 1

## 2013-11-09 NOTE — Telephone Encounter (Signed)
gv pt appt schedule for april/may °

## 2013-11-09 NOTE — Patient Instructions (Signed)
[facility] Discharge Instructions for Patients Receiving Chemotherapy  Today you received the following chemotherapy agents RITUXAN, TREANDA   To help prevent nausea and vomiting after your treatment, we encourage you to take your nausea medication if needed.   If you develop nausea and vomiting that is not controlled by your nausea medication, call the clinic.   BELOW ARE SYMPTOMS THAT SHOULD BE REPORTED IMMEDIATELY:  *FEVER GREATER THAN 100.5 F  *CHILLS WITH OR WITHOUT FEVER  NAUSEA AND VOMITING THAT IS NOT CONTROLLED WITH YOUR NAUSEA MEDICATION  *UNUSUAL SHORTNESS OF BREATH  *UNUSUAL BRUISING OR BLEEDING  TENDERNESS IN MOUTH AND THROAT WITH OR WITHOUT PRESENCE OF ULCERS  *URINARY PROBLEMS  *BOWEL PROBLEMS  UNUSUAL RASH Items with * indicate a potential emergency and should be followed up as soon as possible.  Feel free to call the clinic you have any questions or concerns. The clinic phone number is [phone].

## 2013-11-09 NOTE — Progress Notes (Signed)
[facility] OFFICE PROGRESS NOTE  Patient Care Team: Provider Default, MD as PCP - General  DIAGNOSIS: Mantle cell lymphoma  SUMMARY OF ONCOLOGIC HISTORY:   Mantle cell lymphoma   09/12/2013 Procedure Patient have right axillary lymph node biopsy that confirmed B-cell, non-Hodgkin's lymphoma, suspect mantle cell lymphoma   09/12/2013 Procedure Patient had placement of Infuse-a-Port   09/16/2013 Imaging PET/CT scan showed multifocal hypermetabolic nodal activity involving the neck, chest, abdomen and pelvis as described. In addition, there is moderate splenomegaly with associated hypermetabolic activity consistent with lymphomatous involvement   09/21/2013 -  Chemotherapy The patient will start on cycle 1 of bendamustine and rituximab    INTERVAL HISTORY: Manuel [name] 73 y.o. male returns for further followup. He continued to have good appetite and has gained 8 pounds of weight. He had recurrent gout attack 2 days ago. He is currently taking prednisone. Denies any constipation. He is not taking any pain medicine for his gout pain.  I have reviewed the past medical history, past surgical history, social history and family history with the patient and they are unchanged from previous note.  ALLERGIES:  has No Known Allergies.  MEDICATIONS:  Current Outpatient Prescriptions  Medication Sig Dispense Refill  . acyclovir (ZOVIRAX) 400 MG tablet Take 1 tablet (400 mg total) by mouth daily.  30 tablet  3  . Cholecalciferol (VITAMIN D PO) Take 2,000 Units by mouth.       . levothyroxine (SYNTHROID, LEVOTHROID) 75 MCG tablet Take 75 mcg by mouth daily.      Marland Kitchen lidocaine-prilocaine (EMLA) cream Apply 1 application topically as needed. Apply to port a cath site one hour prior to needle stick.  30 g  0  . ondansetron (ZOFRAN) 8 MG tablet Take 1 tablet (8 mg total) by mouth every 8 (eight) hours as needed for nausea.  30 tablet  1  . oxyCODONE (ROXICODONE) 15 MG immediate release  tablet Take 1 tablet (15 mg total) by mouth every 4 (four) hours as needed for severe pain.  60 tablet  0  . polyethylene glycol (MIRALAX) packet Take 17 g by mouth daily.  30 each  0  . predniSONE (DELTASONE) 20 MG tablet Take 20 mg by mouth daily with breakfast.      . prochlorperazine (COMPAZINE) 10 MG tablet Take 1 tablet (10 mg total) by mouth every 6 (six) hours as needed (Nausea or vomiting).  30 tablet  1  . allopurinol (ZYLOPRIM) 300 MG tablet Take 1 tablet (300 mg total) by mouth daily.  30 tablet  3   No current facility-administered medications for this visit.   Facility-Administered Medications Ordered in Other Visits  Medication Dose Route Frequency Provider Last Rate Last Dose  . dexamethasone (DECADRON) injection 10 mg  10 mg Intravenous Once Heath Lark, MD      . heparin lock flush 100 unit/mL  500 Units Intracatheter Once PRN Heath Lark, MD      . ondansetron (ZOFRAN) IVPB 8 mg  8 mg Intravenous Once Heath Lark, MD      . sodium chloride 0.9 % injection 10 mL  10 mL Intracatheter PRN Heath Lark, MD   10 mL at 09/21/13 1646  . sodium chloride 0.9 % injection 10 mL  10 mL Intracatheter PRN Heath Lark, MD        REVIEW OF SYSTEMS:   Constitutional: Denies fevers, chills or abnormal weight loss Eyes: Denies blurriness of vision Ears, nose, mouth, throat, and face: Denies mucositis or  sore throat Respiratory: Denies cough, dyspnea or wheezes Cardiovascular: Denies palpitation, chest discomfort or lower extremity swelling Gastrointestinal:  Denies nausea, heartburn or change in bowel habits Skin: Denies abnormal skin rashes Lymphatics: Denies new lymphadenopathy or easy bruising Neurological:Denies numbness, tingling or new weaknesses Behavioral/Psych: Mood is stable, no new changes  All other systems were reviewed with the patient and are negative.  PHYSICAL EXAMINATION: ECOG PERFORMANCE STATUS: 0 - Asymptomatic  Filed Vitals:   11/09/13 0908  BP: 150/68  Pulse: 74   Temp: 97.9 F (36.6 C)  Resp: 18   Filed Weights   11/09/13 0908  Weight: 194 lb 14.4 oz (88.406 kg)    GENERAL:alert, no distress and comfortable SKIN: skin color, texture, turgor are normal, no rashes or significant lesions EYES: normal, Conjunctiva are pink and non-injected, sclera clear OROPHARYNX:no exudate, no erythema and lips, buccal mucosa, and tongue normal  NECK: supple, thyroid normal size, non-tender, without nodularity LYMPH:  no palpable lymphadenopathy in the cervical, axillary or inguinal LUNGS: clear to auscultation and percussion with normal breathing effort HEART: regular rate & rhythm and no murmurs and no lower extremity edema ABDOMEN:abdomen soft, non-tender and normal bowel sounds Musculoskeletal:no cyanosis of digits and no clubbing . Minimum joint swelling NEURO: alert & oriented x 3 with fluent speech, no focal motor/sensory deficits  LABORATORY DATA:  I have reviewed the data as listed    Component Value Date/Time   NA 143 11/09/2013 0855   K 3.8 11/09/2013 0855   CO2 28 11/09/2013 0855   GLUCOSE 76 11/09/2013 0855   BUN 18.3 11/09/2013 0855   CREATININE 1.0 11/09/2013 0855   CALCIUM 9.6 11/09/2013 0855   PROT 7.0 11/09/2013 0855   ALBUMIN 3.7 11/09/2013 0855   AST 12 11/09/2013 0855   ALT 15 11/09/2013 0855   ALKPHOS 85 11/09/2013 0855   BILITOT 0.27 11/09/2013 0855    No results found for this basename: SPEP, UPEP,  kappa and lambda light chains    Lab Results  Component Value Date   WBC 10.3 11/09/2013   NEUTROABS 8.1* 11/09/2013   HGB 12.5* 11/09/2013   HCT 38.4 11/09/2013   MCV 94.8 11/09/2013   PLT 238 11/09/2013      Chemistry      Component Value Date/Time   NA 143 11/09/2013 0855   K 3.8 11/09/2013 0855   CO2 28 11/09/2013 0855   BUN 18.3 11/09/2013 0855   CREATININE 1.0 11/09/2013 0855      Component Value Date/Time   CALCIUM 9.6 11/09/2013 0855   ALKPHOS 85 11/09/2013 0855   AST 12 11/09/2013 0855   ALT 15 11/09/2013 0855   BILITOT 0.27 11/09/2013 0855      ASSESSMENT & PLAN:  #1 Mantle cell lymphoma He has excellent response to treatment. His prior lymphadenopathy and splenomegaly are no longer palpable. At present time I do not see a benefit of ordering imaging study. We are committed to do 6 cycles of treatment. I recommend that referral for bone marrow transplant discussion and he agreed. We will proceed with treatment today without delay. #2 recurrent gout attack with high uric acid level I recommend infusion Rasburicase tomorrow to prevent another gout attack #3 gout attack He is currently on a tapering course of prednisone. #4 anemia. This is likely due to recent treatment. The patient denies recent history of bleeding such as epistaxis, hematuria or hematochezia. He is asymptomatic from the anemia. I will observe for now.  He does not require transfusion now.  I will continue the chemotherapy at current dose without dosage adjustment.  If the anemia gets progressive worse in the future, I might have to delay his treatment or adjust the chemotherapy dose.  Orders Placed This Encounter  Procedures  . Lactate dehydrogenase    Standing Status: Future     Number of Occurrences:      Standing Expiration Date: 11/09/2014  . Uric Acid    Standing Status: Future     Number of Occurrences:      Standing Expiration Date: 11/09/2014   All questions were answered. The patient knows to call the clinic with any problems, questions or concerns. No barriers to learning was detected. I spent 25 minutes counseling the patient face to face. The total time spent in the appointment was 30 minutes and more than 50% was on counseling and review of test results     Heath Lark, MD 11/09/2013 12:05 PM

## 2013-11-10 ENCOUNTER — Ambulatory Visit (HOSPITAL_BASED_OUTPATIENT_CLINIC_OR_DEPARTMENT_OTHER): Payer: Medicare PPO

## 2013-11-10 VITALS — BP 137/71 | HR 87 | Temp 99.2°F | Resp 18

## 2013-11-10 DIAGNOSIS — Z5111 Encounter for antineoplastic chemotherapy: Secondary | ICD-10-CM

## 2013-11-10 DIAGNOSIS — M109 Gout, unspecified: Secondary | ICD-10-CM

## 2013-11-10 DIAGNOSIS — C8588 Other specified types of non-Hodgkin lymphoma, lymph nodes of multiple sites: Secondary | ICD-10-CM

## 2013-11-10 DIAGNOSIS — C8319 Mantle cell lymphoma, extranodal and solid organ sites: Secondary | ICD-10-CM

## 2013-11-10 DIAGNOSIS — C8590 Non-Hodgkin lymphoma, unspecified, unspecified site: Secondary | ICD-10-CM

## 2013-11-10 DIAGNOSIS — C8310 Mantle cell lymphoma, unspecified site: Secondary | ICD-10-CM

## 2013-11-10 MED ORDER — SODIUM CHLORIDE 0.9 % IJ SOLN
10.0000 mL | INTRAMUSCULAR | Status: DC | PRN
  Administered 2013-11-10: 10 mL
  Filled 2013-11-10: qty 10

## 2013-11-10 MED ORDER — SODIUM CHLORIDE 0.9 % IV SOLN
Freq: Once | INTRAVENOUS | Status: AC
Start: 2013-11-10 — End: 2013-11-10
  Administered 2013-11-10: 13:00:00 via INTRAVENOUS

## 2013-11-10 MED ORDER — RASBURICASE 1.5 MG IV SOLR
3.0000 mg | Freq: Once | INTRAVENOUS | Status: DC
  Filled 2013-11-10: qty 2

## 2013-11-10 MED ORDER — ONDANSETRON 8 MG/50ML IVPB (CHCC)
8.0000 mg | Freq: Once | INTRAVENOUS | Status: AC
  Administered 2013-11-10: 8 mg via INTRAVENOUS

## 2013-11-10 MED ORDER — ONDANSETRON 8 MG/NS 50 ML IVPB
INTRAVENOUS | Status: AC
  Filled 2013-11-10: qty 8

## 2013-11-10 MED ORDER — HEPARIN SOD (PORK) LOCK FLUSH 100 UNIT/ML IV SOLN
500.0000 [IU] | Freq: Once | INTRAVENOUS | Status: AC | PRN
  Administered 2013-11-10: 500 [IU]
  Filled 2013-11-10: qty 5

## 2013-11-10 MED ORDER — DEXAMETHASONE SODIUM PHOSPHATE 10 MG/ML IJ SOLN
10.0000 mg | Freq: Once | INTRAMUSCULAR | Status: AC
  Administered 2013-11-10: 10 mg via INTRAVENOUS

## 2013-11-10 MED ORDER — DEXAMETHASONE SODIUM PHOSPHATE 10 MG/ML IJ SOLN
INTRAMUSCULAR | Status: AC
  Filled 2013-11-10: qty 1

## 2013-11-10 MED ORDER — SODIUM CHLORIDE 0.9 % IV SOLN
90.0000 mg/m2 | Freq: Once | INTRAVENOUS | Status: AC
  Administered 2013-11-10: 189 mg via INTRAVENOUS
  Filled 2013-11-10: qty 2.1

## 2013-11-10 MED ORDER — RASBURICASE 1.5 MG IV SOLR: 3.0000 mg | Freq: Once | INTRAVENOUS | Status: DC

## 2013-11-10 MED ORDER — SODIUM CHLORIDE 0.9 % IV SOLN
3.0000 mg | Freq: Once | INTRAVENOUS | Status: AC
  Administered 2013-11-10: 3 mg via INTRAVENOUS
  Filled 2013-11-10: qty 2

## 2013-11-10 NOTE — Patient Instructions (Addendum)
[facility] Discharge Instructions for Patients Receiving Chemotherapy  Today you received the following chemotherapy agents: treanda  To help prevent nausea and vomiting after your treatment, we encourage you to take your nausea medication.  Take it as often as prescribed.     If you develop nausea and vomiting that is not controlled by your nausea medication, call the clinic. If it is after clinic hours your family physician or the after hours number for the clinic or go to the Emergency Department.   BELOW ARE SYMPTOMS THAT SHOULD BE REPORTED IMMEDIATELY:  *FEVER GREATER THAN 100.5 F  *CHILLS WITH OR WITHOUT FEVER  NAUSEA AND VOMITING THAT IS NOT CONTROLLED WITH YOUR NAUSEA MEDICATION  *UNUSUAL SHORTNESS OF BREATH  *UNUSUAL BRUISING OR BLEEDING  TENDERNESS IN MOUTH AND THROAT WITH OR WITHOUT PRESENCE OF ULCERS  *URINARY PROBLEMS  *BOWEL PROBLEMS  UNUSUAL RASH Items with * indicate a potential emergency and should be followed up as soon as possible.  Feel free to call the clinic you have any questions or concerns. The clinic phone number is [phone].   I have been informed and understand all the instructions given to me. I know to contact the clinic, my physician, or go to the Emergency Department if any problems should occur. I do not have any questions at this time, but understand that I may call the clinic during office hours   should I have any questions or need assistance in obtaining follow up care.    __________________________________________  _____________  __________ Signature of Patient or Authorized Representative            Date                   Time    __________________________________________ Nurse's Signature   Rasburicase Injection What is this medicine? RASBURICASE (ras BURE i kase) breaks down uric acid in the blood. It is used to prevent and to treat high levels of uric acid caused by cancer treatment. This medicine may be  used for other purposes; ask your health care provider or pharmacist if you have questions. COMMON BRAND NAME(S): Elitek What should I tell my health care provider before I take this medicine? They need to know if you have any of these conditions: -G6PD deficiency -history of anemia -history of blood transfusion -an unusual or allergic reaction to rasburicase, yeast, mannitol, other medicines, foods, dyes, or preservatives -pregnant or trying to get pregnant -breast-feeding How should I use this medicine? This medicine is for infusion into a vein. It is given by a health care professional in a hospital or clinic setting. Talk to your pediatrician regarding the use of this medicine in children. While this drug may be prescribed for children as young as 77 month old for selected conditions, precautions do apply. Overdosage: If you think you have taken too much of this medicine contact a poison control center or emergency room at once. NOTE: This medicine is only for you. Do not share this medicine with others. What if I miss a dose? This does not apply. What may interact with this medicine? -allopurinol This list may not describe all possible interactions. Give your health care provider a list of all the medicines, herbs, non-prescription drugs, or dietary supplements you use. Also tell them if you smoke, drink alcohol, or use illegal drugs. Some items may interact with your medicine. What should I watch for while using this medicine? Your condition will be monitored carefully while you  are receiving this medicine. You will need to have regular blood tests during your treatment. What side effects may I notice from receiving this medicine? Side effects that you should report to your doctor or health care professional as soon as possible: -allergic reactions like skin rash, itching or hives, swelling of the face, lips, or tongue -blue color to lips or nailbeds -breathing problems -chest pain,  tightness -fast, irregular heartbeat -feeling faint or lightheaded, falls -fever -low blood pressure -seizures -trouble passing urine or change in the amount of urine -yellowing of the eyes or skin Side effects that usually do not require medical attention (report to your doctor or health care professional if they continue or are bothersome): -constipation or diarrhea -diarrhea -headache -mouth sores -nausea, vomiting This list may not describe all possible side effects. Call your doctor for medical advice about side effects. You may report side effects to FDA at 1-800-FDA-1088. Where should I keep my medicine? This drug is given in a hospital or clinic and will not be stored at home. NOTE: This sheet is a summary. It may not cover all possible information. If you have questions about this medicine, talk to your doctor, pharmacist, or health care provider.  2014, [employer]vier/Gold Standard. (2008-03-19 14:22:08)

## 2013-11-11 ENCOUNTER — Ambulatory Visit (HOSPITAL_BASED_OUTPATIENT_CLINIC_OR_DEPARTMENT_OTHER): Payer: Medicare PPO

## 2013-11-11 VITALS — BP 165/71 | HR 85 | Temp 98.0°F | Resp 20

## 2013-11-11 DIAGNOSIS — Z5189 Encounter for other specified aftercare: Secondary | ICD-10-CM

## 2013-11-11 DIAGNOSIS — C8319 Mantle cell lymphoma, extranodal and solid organ sites: Secondary | ICD-10-CM

## 2013-11-11 MED ORDER — PEGFILGRASTIM INJECTION 6 MG/0.6ML
6.0000 mg | Freq: Once | SUBCUTANEOUS | Status: AC
  Administered 2013-11-11: 6 mg via SUBCUTANEOUS

## 2013-11-11 NOTE — Patient Instructions (Signed)

## 2013-11-15 ENCOUNTER — Telehealth: Payer: Self-pay | Admitting: *Deleted

## 2013-11-15 NOTE — Telephone Encounter (Signed)
Daughter left VM reports pt is feeling "rougher than usual."  He complains of a stomach ache.  Denies any n/v or diarrhea.  No fever. His feet hurt some but not as bad as before.  He has all over body aches and feels fatigued.  She just wants to make sure this is all normal or anything different to do?   Called dau back and discussed pt having common side effects of chemo and neulasta injection.  Encourage good hydration,  Nutrition and rest. She states his abd pain is generalized and denies he is constipated. States he is able to eat well.   She gave him a prednisone to take this morning for his feet in case it is the gout acting up again but the feet pain is still much better than it has been after previous treatments.   Instructed her to call us back if feet pain worsen,  abd pain  Or anything else worsens. She states she is comfortable w/ this and verbalized understanding.

## 2013-11-16 ENCOUNTER — Ambulatory Visit: Payer: Medicare PPO

## 2013-11-17 ENCOUNTER — Ambulatory Visit: Payer: Medicare PPO

## 2013-11-24 ENCOUNTER — Encounter: Payer: Self-pay | Admitting: Hematology and Oncology

## 2013-11-24 NOTE — Progress Notes (Signed)
Received letter from Patient [employer].  Pt is approved for Treanda and Rituxan from 11/24/13 to 11/24/14 or when benefit cap has been met.  Expenses can be submitted for reimbursement for dos 08/26/13 to 11/24/14.  Amount of grant is $10,000.

## 2013-11-28 ENCOUNTER — Other Ambulatory Visit: Payer: Self-pay | Admitting: Hematology and Oncology

## 2013-11-29 ENCOUNTER — Other Ambulatory Visit (HOSPITAL_BASED_OUTPATIENT_CLINIC_OR_DEPARTMENT_OTHER): Payer: Medicare PPO

## 2013-11-29 ENCOUNTER — Other Ambulatory Visit: Payer: Self-pay | Admitting: Hematology and Oncology

## 2013-11-29 ENCOUNTER — Ambulatory Visit (HOSPITAL_BASED_OUTPATIENT_CLINIC_OR_DEPARTMENT_OTHER): Payer: Medicare PPO

## 2013-11-29 ENCOUNTER — Telehealth: Payer: Self-pay | Admitting: Hematology and Oncology

## 2013-11-29 ENCOUNTER — Ambulatory Visit (HOSPITAL_BASED_OUTPATIENT_CLINIC_OR_DEPARTMENT_OTHER): Payer: Medicare PPO | Admitting: Hematology and Oncology

## 2013-11-29 VITALS — BP 147/75 | HR 72 | Temp 98.1°F | Resp 18 | Ht 75.0 in | Wt 195.2 lb

## 2013-11-29 VITALS — BP 135/61 | HR 63 | Temp 96.9°F | Resp 18

## 2013-11-29 DIAGNOSIS — Z5112 Encounter for antineoplastic immunotherapy: Secondary | ICD-10-CM

## 2013-11-29 DIAGNOSIS — M109 Gout, unspecified: Secondary | ICD-10-CM

## 2013-11-29 DIAGNOSIS — F17200 Nicotine dependence, unspecified, uncomplicated: Secondary | ICD-10-CM

## 2013-11-29 DIAGNOSIS — Z5111 Encounter for antineoplastic chemotherapy: Secondary | ICD-10-CM

## 2013-11-29 DIAGNOSIS — C8319 Mantle cell lymphoma, extranodal and solid organ sites: Secondary | ICD-10-CM

## 2013-11-29 DIAGNOSIS — C8590 Non-Hodgkin lymphoma, unspecified, unspecified site: Secondary | ICD-10-CM

## 2013-11-29 DIAGNOSIS — C8310 Mantle cell lymphoma, unspecified site: Secondary | ICD-10-CM

## 2013-11-29 LAB — COMPREHENSIVE METABOLIC PANEL (CC13)
ALBUMIN: 3.7 g/dL (ref 3.5–5.0)
ALK PHOS: 92 U/L (ref 40–150)
ALT: 10 U/L (ref 0–55)
AST: 13 U/L (ref 5–34)
Anion Gap: 14 mEq/L — ABNORMAL HIGH (ref 3–11)
BUN: 16.4 mg/dL (ref 7.0–26.0)
CALCIUM: 10.4 mg/dL (ref 8.4–10.4)
CO2: 25 mEq/L (ref 22–29)
CREATININE: 1.2 mg/dL (ref 0.7–1.3)
Chloride: 105 mEq/L (ref 98–109)
GLUCOSE: 89 mg/dL (ref 70–140)
Potassium: 4.6 mEq/L (ref 3.5–5.1)
Sodium: 144 mEq/L (ref 136–145)
Total Bilirubin: 0.35 mg/dL (ref 0.20–1.20)
Total Protein: 7.4 g/dL (ref 6.4–8.3)

## 2013-11-29 LAB — CBC WITH DIFFERENTIAL/PLATELET
BASO%: 1.3 % (ref 0.0–2.0)
BASOS ABS: 0.1 10*3/uL (ref 0.0–0.1)
EOS ABS: 0.5 10*3/uL (ref 0.0–0.5)
EOS%: 4.9 % (ref 0.0–7.0)
HEMATOCRIT: 42.5 % (ref 38.4–49.9)
HEMOGLOBIN: 14.5 g/dL (ref 13.0–17.1)
LYMPH%: 4.4 % — ABNORMAL LOW (ref 14.0–49.0)
MCH: 32.1 pg (ref 27.2–33.4)
MCHC: 34.2 g/dL (ref 32.0–36.0)
MCV: 94 fL (ref 79.3–98.0)
MONO#: 1.2 10*3/uL — ABNORMAL HIGH (ref 0.1–0.9)
MONO%: 10.8 % (ref 0.0–14.0)
NEUT%: 78.6 % — AB (ref 39.0–75.0)
NEUTROS ABS: 8.5 10*3/uL — AB (ref 1.5–6.5)
PLATELETS: 252 10*3/uL (ref 140–400)
RBC: 4.52 10*6/uL (ref 4.20–5.82)
RDW: 16.7 % — ABNORMAL HIGH (ref 11.0–14.6)
WBC: 10.9 10*3/uL — ABNORMAL HIGH (ref 4.0–10.3)
lymph#: 0.5 10*3/uL — ABNORMAL LOW (ref 0.9–3.3)

## 2013-11-29 LAB — LACTATE DEHYDROGENASE (CC13): LDH: 185 U/L (ref 125–245)

## 2013-11-29 LAB — URIC ACID (CC13): Uric Acid, Serum: 8.2 mg/dl — ABNORMAL HIGH (ref 2.6–7.4)

## 2013-11-29 MED ORDER — HEPARIN SOD (PORK) LOCK FLUSH 100 UNIT/ML IV SOLN
500.0000 [IU] | Freq: Once | INTRAVENOUS | Status: AC | PRN
  Administered 2013-11-29: 500 [IU]
  Filled 2013-11-29: qty 5

## 2013-11-29 MED ORDER — DEXAMETHASONE SODIUM PHOSPHATE 10 MG/ML IJ SOLN
10.0000 mg | Freq: Once | INTRAMUSCULAR | Status: AC
  Administered 2013-11-29: 10 mg via INTRAVENOUS

## 2013-11-29 MED ORDER — SODIUM CHLORIDE 0.9 % IV SOLN
Freq: Once | INTRAVENOUS | Status: AC
  Administered 2013-11-29: 10:00:00 via INTRAVENOUS

## 2013-11-29 MED ORDER — DIPHENHYDRAMINE HCL 25 MG PO CAPS
ORAL_CAPSULE | ORAL | Status: AC
  Filled 2013-11-29: qty 2

## 2013-11-29 MED ORDER — ACETAMINOPHEN 325 MG PO TABS
650.0000 mg | ORAL_TABLET | Freq: Once | ORAL | Status: AC
  Administered 2013-11-29: 650 mg via ORAL

## 2013-11-29 MED ORDER — SODIUM CHLORIDE 0.9 % IJ SOLN
10.0000 mL | INTRAMUSCULAR | Status: DC | PRN
  Administered 2013-11-29: 10 mL
  Filled 2013-11-29: qty 10

## 2013-11-29 MED ORDER — SODIUM CHLORIDE 0.9 % IV SOLN
90.0000 mg/m2 | Freq: Once | INTRAVENOUS | Status: AC
  Administered 2013-11-29: 189 mg via INTRAVENOUS
  Filled 2013-11-29: qty 2.1

## 2013-11-29 MED ORDER — DEXAMETHASONE SODIUM PHOSPHATE 10 MG/ML IJ SOLN
INTRAMUSCULAR | Status: AC
  Filled 2013-11-29: qty 1

## 2013-11-29 MED ORDER — DIPHENHYDRAMINE HCL 25 MG PO CAPS
50.0000 mg | ORAL_CAPSULE | Freq: Once | ORAL | Status: AC
  Administered 2013-11-29: 50 mg via ORAL

## 2013-11-29 MED ORDER — ONDANSETRON 8 MG/50ML IVPB (CHCC)
8.0000 mg | Freq: Once | INTRAVENOUS | Status: AC
  Administered 2013-11-29: 8 mg via INTRAVENOUS

## 2013-11-29 MED ORDER — SODIUM CHLORIDE 0.9 % IV SOLN
375.0000 mg/m2 | Freq: Once | INTRAVENOUS | Status: AC
  Administered 2013-11-29: 800 mg via INTRAVENOUS
  Filled 2013-11-29: qty 80

## 2013-11-29 MED ORDER — ACETAMINOPHEN 325 MG PO TABS
ORAL_TABLET | ORAL | Status: AC
  Filled 2013-11-29: qty 2

## 2013-11-29 NOTE — Progress Notes (Signed)
[facility] OFFICE PROGRESS NOTE  Patient Care Team: Provider Default, MD as PCP - [place], MD as Referring Physician (Hematology and Oncology)  DIAGNOSIS: Mantle cell lymphoma  SUMMARY OF ONCOLOGIC HISTORY:   Mantle cell lymphoma   09/12/2013 Procedure Patient have right axillary lymph node biopsy that confirmed B-cell, non-Hodgkin's lymphoma, suspect mantle cell lymphoma   09/12/2013 Procedure Patient had placement of Infuse-a-Port   09/16/2013 Imaging PET/CT scan showed multifocal hypermetabolic nodal activity involving the neck, chest, abdomen and pelvis as described. In addition, there is moderate splenomegaly with associated hypermetabolic activity consistent with lymphomatous involvement   09/21/2013 -  Chemotherapy The patient will start on cycle 1 of bendamustine and rituximab    INTERVAL HISTORY: Manuel [name] 73 y.o. male returns for further followup. He is doing well. He is attempting to quit smoking. He denies any recurrence of gout attack. He complained of some sinus allergies. He saw a lymphoma specialist for discussion about potential bone marrow transplant but currently is not keen to pursue this.   I have reviewed the past medical history, past surgical history, social history and family history with the patient and they are unchanged from previous note.  ALLERGIES:  has No Known Allergies.  MEDICATIONS:  Current Outpatient Prescriptions  Medication Sig Dispense Refill  . acyclovir (ZOVIRAX) 400 MG tablet Take 1 tablet (400 mg total) by mouth daily.  30 tablet  3  . allopurinol (ZYLOPRIM) 300 MG tablet Take 1 tablet (300 mg total) by mouth daily.  30 tablet  3  . Cholecalciferol (VITAMIN D PO) Take 2,000 Units by mouth.       . levothyroxine (SYNTHROID, LEVOTHROID) 75 MCG tablet Take 75 mcg by mouth daily.      Marland Kitchen lidocaine-prilocaine (EMLA) cream Apply 1 application topically as needed. Apply to port a cath site one hour prior to  needle stick.  30 g  0  . ondansetron (ZOFRAN) 8 MG tablet Take 1 tablet (8 mg total) by mouth every 8 (eight) hours as needed for nausea.  30 tablet  1  . oxyCODONE (ROXICODONE) 15 MG immediate release tablet Take 1 tablet (15 mg total) by mouth every 4 (four) hours as needed for severe pain.  60 tablet  0  . polyethylene glycol (MIRALAX) packet Take 17 g by mouth daily.  30 each  0  . predniSONE (DELTASONE) 20 MG tablet Take 20 mg by mouth as directed.       . prochlorperazine (COMPAZINE) 10 MG tablet Take 1 tablet (10 mg total) by mouth every 6 (six) hours as needed (Nausea or vomiting).  30 tablet  1   No current facility-administered medications for this visit.   Facility-Administered Medications Ordered in Other Visits  Medication Dose Route Frequency Provider Last Rate Last Dose  . sodium chloride 0.9 % injection 10 mL  10 mL Intracatheter PRN Heath Lark, MD   10 mL at 09/21/13 1646  . sodium chloride 0.9 % injection 10 mL  10 mL Intracatheter PRN Heath Lark, MD   10 mL at 11/29/13 1520    REVIEW OF SYSTEMS:   Constitutional: Denies fevers, chills or abnormal weight loss Eyes: Denies blurriness of vision Ears, nose, mouth, throat, and face: Denies mucositis or sore throat Respiratory: Denies cough, dyspnea or wheezes Cardiovascular: Denies palpitation, chest discomfort or lower extremity swelling Gastrointestinal:  Denies nausea, heartburn or change in bowel habits Skin: Denies abnormal skin rashes Lymphatics: Denies new lymphadenopathy or easy bruising Neurological:Denies  numbness, tingling or new weaknesses Behavioral/Psych: Mood is stable, no new changes  All other systems were reviewed with the patient and are negative.  PHYSICAL EXAMINATION: ECOG PERFORMANCE STATUS: 1 - Symptomatic but completely ambulatory  Filed Vitals:   11/29/13 0836  BP: 147/75  Pulse: 72  Temp: 98.1 F (36.7 C)  Resp: 18   Filed Weights   11/29/13 0836  Weight: 195 lb 3.2 oz (88.542 kg)     GENERAL:alert, no distress and comfortable SKIN: skin color, texture, turgor are normal, no rashes or significant lesions EYES: normal, Conjunctiva are pink and non-injected, sclera clear OROPHARYNX:no exudate, no erythema and lips, buccal mucosa, and tongue normal  NECK: supple, thyroid normal size, non-tender, without nodularity LYMPH:  no palpable lymphadenopathy in the cervical, axillary or inguinal LUNGS: clear to auscultation and percussion with normal breathing effort HEART: regular rate & rhythm and no murmurs and no lower extremity edema ABDOMEN:abdomen soft, non-tender and normal bowel sounds. Spleen is no longer palpable Musculoskeletal:no cyanosis of digits and no clubbing . There is some mild redness in the dorsum of the right foot not consistent with gout NEURO: alert & oriented x 3 with fluent speech, no focal motor/sensory deficits  LABORATORY DATA:  I have reviewed the data as listed    Component Value Date/Time   NA 144 11/29/2013 0825   K 4.6 11/29/2013 0825   CO2 25 11/29/2013 0825   GLUCOSE 89 11/29/2013 0825   BUN 16.4 11/29/2013 0825   CREATININE 1.2 11/29/2013 0825   CALCIUM 10.4 11/29/2013 0825   PROT 7.4 11/29/2013 0825   ALBUMIN 3.7 11/29/2013 0825   AST 13 11/29/2013 0825   ALT 10 11/29/2013 0825   ALKPHOS 92 11/29/2013 0825   BILITOT 0.35 11/29/2013 0825    No results found for this basename: SPEP,  UPEP,   kappa and lambda light chains    Lab Results  Component Value Date   WBC 10.9* 11/29/2013   NEUTROABS 8.5* 11/29/2013   HGB 14.5 11/29/2013   HCT 42.5 11/29/2013   MCV 94.0 11/29/2013   PLT 252 11/29/2013      Chemistry      Component Value Date/Time   NA 144 11/29/2013 0825   K 4.6 11/29/2013 0825   CO2 25 11/29/2013 0825   BUN 16.4 11/29/2013 0825   CREATININE 1.2 11/29/2013 0825      Component Value Date/Time   CALCIUM 10.4 11/29/2013 0825   ALKPHOS 92 11/29/2013 0825   AST 13 11/29/2013 0825   ALT 10 11/29/2013 0825   BILITOT 0.35 11/29/2013 0825      ASSESSMENT & PLAN:  #1 Mantle cell lymphoma He has excellent response to treatment. His prior lymphadenopathy and splenomegaly are no longer palpable. At present time I do not see a benefit of ordering imaging study. We are committed to do 6 cycles of treatment. The patient is contemplating about potential bone marrow transplant. 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 #3 tobacco abuse The patient is attempting to quit smoking. I continue to encourage him. All questions were answered. The patient knows to call the clinic with any problems, questions or concerns. No barriers to learning was detected. I spent 40 minutes counseling the patient face to face. The total time spent in the appointment was 55 minutes and more than 50% was on counseling and review of test results     Heath Lark,  MD 11/29/2013 4:12 PM

## 2013-11-29 NOTE — Patient Instructions (Signed)
[facility] Discharge Instructions for Patients Receiving Chemotherapy  Today you received the following chemotherapy agents Rituxan and Treanda   To help prevent nausea and vomiting after your treatment, we encourage you to take your nausea medication Compazine 10 mg every 6 hours or Zofran 8 mg every 8 hours as needed.  If you develop nausea and vomiting that is not controlled by your nausea medication, call the clinic.   BELOW ARE SYMPTOMS THAT SHOULD BE REPORTED IMMEDIATELY:  *FEVER GREATER THAN 100.5 F  *CHILLS WITH OR WITHOUT FEVER  NAUSEA AND VOMITING THAT IS NOT CONTROLLED WITH YOUR NAUSEA MEDICATION  *UNUSUAL SHORTNESS OF BREATH  *UNUSUAL BRUISING OR BLEEDING  TENDERNESS IN MOUTH AND THROAT WITH OR WITHOUT PRESENCE OF ULCERS  *URINARY PROBLEMS  *BOWEL PROBLEMS  UNUSUAL RASH Items with * indicate a potential emergency and should be followed up as soon as possible.  Feel free to call the clinic you have any questions or concerns. The clinic phone number is [phone].

## 2013-11-29 NOTE — Telephone Encounter (Signed)
gv adn printed appt scehd and avs for pt for April and May

## 2013-11-30 ENCOUNTER — Ambulatory Visit (HOSPITAL_BASED_OUTPATIENT_CLINIC_OR_DEPARTMENT_OTHER): Payer: Medicare PPO

## 2013-11-30 VITALS — BP 165/76 | HR 85 | Temp 98.3°F | Resp 18

## 2013-11-30 DIAGNOSIS — M109 Gout, unspecified: Secondary | ICD-10-CM

## 2013-11-30 DIAGNOSIS — C8310 Mantle cell lymphoma, unspecified site: Secondary | ICD-10-CM

## 2013-11-30 DIAGNOSIS — Z5111 Encounter for antineoplastic chemotherapy: Secondary | ICD-10-CM

## 2013-11-30 DIAGNOSIS — C8319 Mantle cell lymphoma, extranodal and solid organ sites: Secondary | ICD-10-CM

## 2013-11-30 MED ORDER — DEXAMETHASONE SODIUM PHOSPHATE 10 MG/ML IJ SOLN
10.0000 mg | Freq: Once | INTRAMUSCULAR | Status: AC
  Administered 2013-11-30: 10 mg via INTRAVENOUS

## 2013-11-30 MED ORDER — HEPARIN SOD (PORK) LOCK FLUSH 100 UNIT/ML IV SOLN
500.0000 [IU] | Freq: Once | INTRAVENOUS | Status: AC | PRN
  Administered 2013-11-30: 500 [IU]
  Filled 2013-11-30: qty 5

## 2013-11-30 MED ORDER — SODIUM CHLORIDE 0.9 % IV SOLN
90.0000 mg/m2 | Freq: Once | INTRAVENOUS | Status: AC
  Administered 2013-11-30: 189 mg via INTRAVENOUS
  Filled 2013-11-30: qty 2.1

## 2013-11-30 MED ORDER — SODIUM CHLORIDE 0.9 % IV SOLN
Freq: Once | INTRAVENOUS | Status: AC
  Administered 2013-11-30: 14:00:00 via INTRAVENOUS

## 2013-11-30 MED ORDER — SODIUM CHLORIDE 0.9 % IJ SOLN
10.0000 mL | INTRAMUSCULAR | Status: DC | PRN
  Administered 2013-11-30: 10 mL
  Filled 2013-11-30: qty 10

## 2013-11-30 MED ORDER — SODIUM CHLORIDE 0.9 % IV SOLN
3.0000 mg | Freq: Once | INTRAVENOUS | Status: AC
  Administered 2013-11-30: 3 mg via INTRAVENOUS
  Filled 2013-11-30: qty 2

## 2013-11-30 MED ORDER — ONDANSETRON 8 MG/50ML IVPB (CHCC)
8.0000 mg | Freq: Once | INTRAVENOUS | Status: AC
  Administered 2013-11-30: 8 mg via INTRAVENOUS

## 2013-11-30 NOTE — Patient Instructions (Signed)
[facility] Discharge Instructions for Patients Receiving Chemotherapy  Today you received the following chemotherapy agents Treanda.  To help prevent nausea and vomiting after your treatment, we encourage you to take your nausea medication.   If you develop nausea and vomiting that is not controlled by your nausea medication, call the clinic.   BELOW ARE SYMPTOMS THAT SHOULD BE REPORTED IMMEDIATELY:  *FEVER GREATER THAN 100.5 F  *CHILLS WITH OR WITHOUT FEVER  NAUSEA AND VOMITING THAT IS NOT CONTROLLED WITH YOUR NAUSEA MEDICATION  *UNUSUAL SHORTNESS OF BREATH  *UNUSUAL BRUISING OR BLEEDING  TENDERNESS IN MOUTH AND THROAT WITH OR WITHOUT PRESENCE OF ULCERS  *URINARY PROBLEMS  *BOWEL PROBLEMS  UNUSUAL RASH Items with * indicate a potential emergency and should be followed up as soon as possible.  Feel free to call the clinic you have any questions or concerns. The clinic phone number is [phone].    

## 2013-12-01 ENCOUNTER — Ambulatory Visit (HOSPITAL_BASED_OUTPATIENT_CLINIC_OR_DEPARTMENT_OTHER): Payer: Medicare PPO

## 2013-12-01 VITALS — BP 159/63 | HR 62 | Temp 98.1°F

## 2013-12-01 DIAGNOSIS — C8588 Other specified types of non-Hodgkin lymphoma, lymph nodes of multiple sites: Secondary | ICD-10-CM

## 2013-12-01 DIAGNOSIS — Z5189 Encounter for other specified aftercare: Secondary | ICD-10-CM

## 2013-12-01 DIAGNOSIS — C8319 Mantle cell lymphoma, extranodal and solid organ sites: Secondary | ICD-10-CM

## 2013-12-01 MED ORDER — PEGFILGRASTIM INJECTION 6 MG/0.6ML
6.0000 mg | Freq: Once | SUBCUTANEOUS | Status: AC
  Administered 2013-12-01: 6 mg via SUBCUTANEOUS
  Filled 2013-12-01: qty 0.6

## 2013-12-05 ENCOUNTER — Telehealth: Payer: Self-pay | Admitting: *Deleted

## 2013-12-05 ENCOUNTER — Ambulatory Visit: Payer: Medicare PPO

## 2013-12-05 NOTE — Telephone Encounter (Signed)
S/w dau and she says pt started feeling bad this past Saturday after his Neulasta on Friday.  No fevers, no diarrhea or constipation,  No n/v..  He c/o feeling very tired with headaches and stomach ache. He did eat 3 very small meals yesterday but only a few sips of fluids.  dau is trying to push fluids but pt is not drinking much at all.  He stayed in bed all day yesterday and is in bed this morning.  He finally agreed to take one tylenol this morning for gout symptoms,  Pain starting in his toes today.  Dau concerned pt won't take anymore chemo if she can't do something to help him feel better.

## 2013-12-05 NOTE — Telephone Encounter (Signed)
Instructed dau to bring pt in today for IVFs.  She doubts pt will agree to come.  She says he is drinking more than a few sips of liquid per day but not very much.  She will encourage him to drink more.  Instructed her to start pt on prednisone 20 mg daily for the gout symptoms and it should help his pain and his appetite as well.  Dr. Alvy Bimler can see pt on Thursday at 2 pm...  Dau will talk to pt and call us back about IVFs.

## 2013-12-05 NOTE — Telephone Encounter (Signed)
Daughter left VM reports pt is feeling "really really bad" with headaches and stomach aches.  She says he just feels bad "all over."  Denies nausea,  but his stomach aches all over.  Says pt feels so bad he has voiced that he does not want to have anymore chemo..  Attempted to return her call and no VM available.  Will try again later.

## 2013-12-05 NOTE — Telephone Encounter (Signed)
Would he come in for IVF? I'm concerned for dehydration There are many ways to modify his Rx but I am most concerned about dehydration now She can also add back low dose prednisone that helps with pain, gout and appetite if he refuses to come in I have an opening on Thursday at around 2 pm I can see him then

## 2013-12-06 ENCOUNTER — Telehealth: Payer: Self-pay | Admitting: *Deleted

## 2013-12-06 NOTE — Telephone Encounter (Signed)
Spoke with daughter. Pt has been drinking fluids all day yesterday, and was much better last night. States he was acting like his normal self. Will cancel appointment with Dr Alvy Bimler and IVF. OK with Dr Alvy Bimler

## 2013-12-07 ENCOUNTER — Ambulatory Visit: Payer: Medicare PPO

## 2013-12-07 ENCOUNTER — Ambulatory Visit: Payer: Medicare PPO | Admitting: Hematology and Oncology

## 2013-12-18 ENCOUNTER — Telehealth: Payer: Self-pay | Admitting: *Deleted

## 2013-12-18 NOTE — Telephone Encounter (Signed)
Message copied by Cathlean Cower on Mon Dec 18, 2013  4:39 PM ------      Message from: [facility], [place]: Mon Dec 18, 2013  3:43 PM      Regarding: RE: CHEMO SCHEDULE       Let's call daughter see how he is doing      He may not want more Rx      If he wants, we can move to 5/28 and 5/29      ----- Message -----         From: Cathlean Cower, RN         Sent: 12/18/2013   3:36 PM           To: Heath Lark, MD      Subject: FW: CHEMO SCHEDULE                                       Do we need to move all his appts to 5/26? Of course there is probably no room in chemo that day....       ----- Message -----         From: Lurena Joiner         Sent: 12/18/2013   3:16 PM           To: Cathlean Cower, RN, Gaspar Bidding, #      Subject: CHEMO SCHEDULE                                           HIS NEXT TREATMENT WILL HAVE TO BE AT LEAST 12/26/13.      HIS INSURANCE WILL ONLY ALLOW A 28 DAY CYCLE THEY WOULD NOT APPROVED A 21 DAY CYCLE.  HE IS SCHEDULED ON 5/21 SO THAT NEEDS TO BE MOVED            THANKS,            eLIZABETH             ------

## 2013-12-19 NOTE — Telephone Encounter (Signed)
Per Infusion room scheduler; there is no room to do long day next week until 5/29. We can do short day on 5/28 and long day on 5/29.   Explained to daughter need to delay chemo from 5/21 to following week due to insurance restrictions.  Further explained need to do the long day on day #2 due to availability issues in infusion room.  Daughter states she cannot pick up pt on 5/29 if it is the long day.  She asks if any way possible to have long day on 5/28 instead,  Otherwise may need to delay treatment to following week.  S/w nursing manager, Burman Nieves, about schedule and she will look into it.

## 2013-12-19 NOTE — Telephone Encounter (Signed)
Chemo schedule adjusted per daughter's request.  Lab and MD visit will be moved from 5/21 to 5/27.  POF sent.  Daughter aware of new schedule date/time.

## 2013-12-20 ENCOUNTER — Telehealth: Payer: Self-pay | Admitting: Hematology and Oncology

## 2013-12-20 NOTE — Telephone Encounter (Signed)
, °

## 2013-12-21 ENCOUNTER — Other Ambulatory Visit: Payer: Medicare PPO

## 2013-12-21 ENCOUNTER — Ambulatory Visit: Payer: Medicare PPO | Admitting: Hematology and Oncology

## 2013-12-21 ENCOUNTER — Ambulatory Visit: Payer: Medicare PPO

## 2013-12-22 ENCOUNTER — Ambulatory Visit: Payer: Medicare PPO

## 2013-12-23 ENCOUNTER — Ambulatory Visit: Payer: Medicare PPO

## 2013-12-27 ENCOUNTER — Other Ambulatory Visit: Payer: Self-pay | Admitting: Hematology and Oncology

## 2013-12-27 ENCOUNTER — Ambulatory Visit (HOSPITAL_BASED_OUTPATIENT_CLINIC_OR_DEPARTMENT_OTHER): Payer: Medicare PPO | Admitting: *Deleted

## 2013-12-27 ENCOUNTER — Telehealth: Payer: Self-pay | Admitting: *Deleted

## 2013-12-27 ENCOUNTER — Ambulatory Visit (HOSPITAL_BASED_OUTPATIENT_CLINIC_OR_DEPARTMENT_OTHER): Payer: Medicare PPO | Admitting: Hematology and Oncology

## 2013-12-27 ENCOUNTER — Encounter: Payer: Self-pay | Admitting: Hematology and Oncology

## 2013-12-27 ENCOUNTER — Telehealth: Payer: Self-pay | Admitting: Hematology and Oncology

## 2013-12-27 VITALS — BP 151/70 | HR 79 | Temp 98.4°F | Resp 20 | Ht 75.0 in | Wt 201.8 lb

## 2013-12-27 DIAGNOSIS — E039 Hypothyroidism, unspecified: Secondary | ICD-10-CM

## 2013-12-27 DIAGNOSIS — C8588 Other specified types of non-Hodgkin lymphoma, lymph nodes of multiple sites: Secondary | ICD-10-CM

## 2013-12-27 DIAGNOSIS — R109 Unspecified abdominal pain: Secondary | ICD-10-CM

## 2013-12-27 DIAGNOSIS — D630 Anemia in neoplastic disease: Secondary | ICD-10-CM

## 2013-12-27 DIAGNOSIS — L80 Vitiligo: Secondary | ICD-10-CM

## 2013-12-27 DIAGNOSIS — F17200 Nicotine dependence, unspecified, uncomplicated: Secondary | ICD-10-CM

## 2013-12-27 DIAGNOSIS — M109 Gout, unspecified: Secondary | ICD-10-CM

## 2013-12-27 DIAGNOSIS — Z720 Tobacco use: Secondary | ICD-10-CM | POA: Insufficient documentation

## 2013-12-27 DIAGNOSIS — D539 Nutritional anemia, unspecified: Secondary | ICD-10-CM

## 2013-12-27 DIAGNOSIS — C8319 Mantle cell lymphoma, extranodal and solid organ sites: Secondary | ICD-10-CM

## 2013-12-27 DIAGNOSIS — R1030 Lower abdominal pain, unspecified: Secondary | ICD-10-CM

## 2013-12-27 DIAGNOSIS — C8590 Non-Hodgkin lymphoma, unspecified, unspecified site: Secondary | ICD-10-CM

## 2013-12-27 DIAGNOSIS — C8310 Mantle cell lymphoma, unspecified site: Secondary | ICD-10-CM

## 2013-12-27 LAB — TSH CHCC: TSH: 1.255 m(IU)/L (ref 0.320–4.118)

## 2013-12-27 LAB — COMPREHENSIVE METABOLIC PANEL (CC13)
ALT: 11 U/L (ref 0–55)
AST: 13 U/L (ref 5–34)
Albumin: 3.6 g/dL (ref 3.5–5.0)
Alkaline Phosphatase: 77 U/L (ref 40–150)
Anion Gap: 10 mEq/L (ref 3–11)
BUN: 16.6 mg/dL (ref 7.0–26.0)
CALCIUM: 9.4 mg/dL (ref 8.4–10.4)
CHLORIDE: 108 meq/L (ref 98–109)
CO2: 25 mEq/L (ref 22–29)
CREATININE: 1.2 mg/dL (ref 0.7–1.3)
Glucose: 107 mg/dl (ref 70–140)
Potassium: 4.4 mEq/L (ref 3.5–5.1)
SODIUM: 144 meq/L (ref 136–145)
TOTAL PROTEIN: 6.8 g/dL (ref 6.4–8.3)
Total Bilirubin: 0.36 mg/dL (ref 0.20–1.20)

## 2013-12-27 LAB — T4, FREE: Free T4: 1.33 ng/dL (ref 0.80–1.80)

## 2013-12-27 LAB — CBC WITH DIFFERENTIAL/PLATELET
BASO%: 1 % (ref 0.0–2.0)
Basophils Absolute: 0.1 10*3/uL (ref 0.0–0.1)
EOS%: 5.8 % (ref 0.0–7.0)
Eosinophils Absolute: 0.5 10*3/uL (ref 0.0–0.5)
HCT: 37.8 % — ABNORMAL LOW (ref 38.4–49.9)
HGB: 12.8 g/dL — ABNORMAL LOW (ref 13.0–17.1)
LYMPH#: 0.7 10*3/uL — AB (ref 0.9–3.3)
LYMPH%: 9.4 % — ABNORMAL LOW (ref 14.0–49.0)
MCH: 31 pg (ref 27.2–33.4)
MCHC: 33.9 g/dL (ref 32.0–36.0)
MCV: 91.5 fL (ref 79.3–98.0)
MONO#: 0.6 10*3/uL (ref 0.1–0.9)
MONO%: 7.1 % (ref 0.0–14.0)
NEUT#: 5.9 10*3/uL (ref 1.5–6.5)
NEUT%: 76.7 % — AB (ref 39.0–75.0)
Platelets: 213 10*3/uL (ref 140–400)
RBC: 4.13 10*6/uL — AB (ref 4.20–5.82)
RDW: 14.9 % — AB (ref 11.0–14.6)
WBC: 7.7 10*3/uL (ref 4.0–10.3)

## 2013-12-27 MED ORDER — LEVOTHYROXINE SODIUM 75 MCG PO TABS: 75.0000 ug | ORAL_TABLET | Freq: Every day | ORAL | Status: DC

## 2013-12-27 MED ORDER — ACYCLOVIR 400 MG PO TABS
400.0000 mg | ORAL_TABLET | Freq: Every day | ORAL | Status: DC
Start: 2013-12-27 — End: 2014-01-28

## 2013-12-27 NOTE — Assessment & Plan Note (Signed)
Medically, he is doing well. I refilled his prescription.

## 2013-12-27 NOTE — Telephone Encounter (Signed)
Dau will not be able to come on pt's MD visit today.  She asks for copies of labwork and AVS.  She also reports that pt c/o pain in his groin, his spleen and his neck. He told her he thinks this pain means the chemo is not working.  He also showed her a spot on his right thigh which looks like a scar and told dau it was from chemo.  She does NOT want pt to know she told us this, just wants MD to be aware so she can assess.  Dau also says pt needs refill on acyclovir and his thyroid medication.  Synthroid usually prescribed by MD at the [facility]. She asks if Dr. Alvy Bimler willing to check pt's thyroid level and refill this medication today?

## 2013-12-27 NOTE — Assessment & Plan Note (Signed)
Recommend pain medicine as needed.

## 2013-12-27 NOTE — Assessment & Plan Note (Addendum)
Overall, he has excellent response to treatment clinically. I will proceed with treatment without dosage adjustment I will order a PET CT scan before final cycle of treatment next month. Plan is for total 6 cycles of treatment.

## 2013-12-27 NOTE — Telephone Encounter (Signed)
Instructed pt on visit today to get copy of his labs tomorrow when he is here for chemo.  All his results are not back yet.  Attempted to call pt's daughter back to let her know we will give pt copy of labwork for her. She verbalized understanding.

## 2013-12-27 NOTE — Assessment & Plan Note (Signed)
Recommend observation for now. I will not adjust the dose of chemotherapy.

## 2013-12-27 NOTE — Telephone Encounter (Signed)
I added Thyroid panel and will try to address all his issues today

## 2013-12-27 NOTE — Assessment & Plan Note (Signed)
Smoking cessation counseling is provided.

## 2013-12-27 NOTE — Progress Notes (Signed)
[facility] OFFICE PROGRESS NOTE  Patient Care Team: Provider Default, MD as PCP - [place], MD as Referring Physician (Hematology and Oncology)  SUMMARY OF ONCOLOGIC HISTORY:   Mantle cell lymphoma   09/12/2013 Procedure Patient have right axillary lymph node biopsy that confirmed B-cell, non-Hodgkin's lymphoma, suspect mantle cell lymphoma   09/12/2013 Procedure Patient had placement of Infuse-a-Port   09/16/2013 Imaging PET/CT scan showed multifocal hypermetabolic nodal activity involving the neck, chest, abdomen and pelvis as described. In addition, there is moderate splenomegaly with associated hypermetabolic activity consistent with lymphomatous involvement   09/21/2013 -  Chemotherapy The patient will start on cycle 1 of bendamustine and rituximab    INTERVAL HISTORY: Please see below for problem oriented charting. He is here before cycle 5 of treatment. He tolerated last treatment well, apart from flare of his gout.   REVIEW OF SYSTEMS:   Constitutional: Denies fevers, chills or abnormal weight loss Eyes: Denies blurriness of vision Ears, nose, mouth, throat, and face: Denies mucositis or sore throat Respiratory: Denies cough, dyspnea or wheezes Cardiovascular: Denies palpitation, chest discomfort or lower extremity swelling Gastrointestinal:  Denies nausea, heartburn or change in bowel habits Lymphatics: Denies new lymphadenopathy or easy bruising Neurological:Denies numbness, tingling or new weaknesses Behavioral/Psych: Mood is stable, no new changes  All other systems were reviewed with the patient and are negative.  I have reviewed the past medical history, past surgical history, social history and family history with the patient and they are unchanged from previous note.  ALLERGIES:  has No Known Allergies.  MEDICATIONS:  Current Outpatient Prescriptions  Medication Sig Dispense Refill  . acyclovir (ZOVIRAX) 400 MG tablet Take 1  tablet (400 mg total) by mouth daily.  90 tablet  3  . allopurinol (ZYLOPRIM) 300 MG tablet Take 1 tablet (300 mg total) by mouth daily.  30 tablet  3  . Cholecalciferol (VITAMIN D PO) Take 2,000 Units by mouth.       . levothyroxine (SYNTHROID, LEVOTHROID) 75 MCG tablet Take 1 tablet (75 mcg total) by mouth daily.  90 tablet  3  . lidocaine-prilocaine (EMLA) cream Apply 1 application topically as needed. Apply to port a cath site one hour prior to needle stick.  30 g  0  . polyethylene glycol (MIRALAX) packet Take 17 g by mouth daily.  30 each  0  . ondansetron (ZOFRAN) 8 MG tablet Take 1 tablet (8 mg total) by mouth every 8 (eight) hours as needed for nausea.  30 tablet  1  . oxyCODONE (ROXICODONE) 15 MG immediate release tablet Take 1 tablet (15 mg total) by mouth every 4 (four) hours as needed for severe pain.  60 tablet  0  . prochlorperazine (COMPAZINE) 10 MG tablet Take 1 tablet (10 mg total) by mouth every 6 (six) hours as needed (Nausea or vomiting).  30 tablet  1   No current facility-administered medications for this visit.   Facility-Administered Medications Ordered in Other Visits  Medication Dose Route Frequency Provider Last Rate Last Dose  . sodium chloride 0.9 % injection 10 mL  10 mL Intracatheter PRN Heath Lark, MD   10 mL at 09/21/13 1646    PHYSICAL EXAMINATION: ECOG PERFORMANCE STATUS: 1 - Symptomatic but completely ambulatory  Filed Vitals:   12/27/13 1137  BP: 151/70  Pulse: 79  Temp: 98.4 F (36.9 C)  Resp: 20   Filed Weights   12/27/13 1137  Weight: 201 lb 12.8 oz (91.536 kg)  GENERAL:alert, no distress and comfortable SKIN: No discoloration on his thigh, consistent with vitiligo  EYES: normal, Conjunctiva are pink and non-injected, sclera clear OROPHARYNX:no exudate, no erythema and lips, buccal mucosa, and tongue normal  NECK: supple, thyroid normal size, non-tender, without nodularity LYMPH:  no palpable lymphadenopathy in the cervical, axillary or  inguinal LUNGS: clear to auscultation and percussion with normal breathing effort HEART: regular rate & rhythm and no murmurs and no lower extremity edema ABDOMEN:abdomen soft, non-tender and normal bowel sounds, no palpable splenomegaly Musculoskeletal:no cyanosis of digits and no clubbing  NEURO: alert & oriented x 3 with fluent speech, no focal motor/sensory deficits Examination of his abdomen did not reveal any inguinal hernia.  LABORATORY DATA:  I have reviewed the data as listed    Component Value Date/Time   NA 144 12/27/2013 1125   K 4.4 12/27/2013 1125   CO2 25 12/27/2013 1125   GLUCOSE 107 12/27/2013 1125   BUN 16.6 12/27/2013 1125   CREATININE 1.2 12/27/2013 1125   CALCIUM 9.4 12/27/2013 1125   PROT 6.8 12/27/2013 1125   ALBUMIN 3.6 12/27/2013 1125   AST 13 12/27/2013 1125   ALT 11 12/27/2013 1125   ALKPHOS 77 12/27/2013 1125   BILITOT 0.36 12/27/2013 1125    No results found for this basename: SPEP, UPEP,  kappa and lambda light chains    Lab Results  Component Value Date   WBC 7.7 12/27/2013   NEUTROABS 5.9 12/27/2013   HGB 12.8* 12/27/2013   HCT 37.8* 12/27/2013   MCV 91.5 12/27/2013   PLT 213 12/27/2013      Chemistry      Component Value Date/Time   NA 144 12/27/2013 1125   K 4.4 12/27/2013 1125   CO2 25 12/27/2013 1125   BUN 16.6 12/27/2013 1125   CREATININE 1.2 12/27/2013 1125      Component Value Date/Time   CALCIUM 9.4 12/27/2013 1125   ALKPHOS 77 12/27/2013 1125   AST 13 12/27/2013 1125   ALT 11 12/27/2013 1125   BILITOT 0.36 12/27/2013 1125     ASSESSMENT & PLAN:  Gout Continue conservative management with allopurinol and prednisone as needed. Today he has no evidence of acute gout attack.  Mantle cell lymphoma Overall, he has excellent response to treatment clinically. I will proceed with treatment without dosage adjustment I will order a PET CT scan before final cycle of treatment next month. Plan is for total 6 cycles of treatment.  Groin pain Recommend  pain medicine as needed.  Hypothyroidism Medically, he is doing well. I refilled his prescription.  Vitiligo I recommend observation only.  Tobacco abuse Smoking cessation counseling is provided.  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 I will not adjust the dose of chemotherapy.      Orders Placed This Encounter  Procedures  . NM PET Image Restag (PS) Skull Base To Thigh    Standing Status: Future     Number of Occurrences:      Standing Expiration Date: 02/26/2015    Order Specific Question:  Reason for Exam (SYMPTOM  OR DIAGNOSIS REQUIRED)    Answer:  mantle cell lymphoma    Order Specific Question:  Preferred imaging location?    Answer:  [facility]  . CBC with Differential    Standing Status: Future     Number of Occurrences:      Standing Expiration Date: 12/27/2014  . Comprehensive metabolic panel    Standing Status: Future  Number of Occurrences:      Standing Expiration Date: 12/27/2014  . Uric Acid    Standing Status: Future     Number of Occurrences:      Standing Expiration Date: 12/27/2014  . Lactate dehydrogenase    Standing Status: Future     Number of Occurrences:      Standing Expiration Date: 12/27/2014   All questions were answered. The patient knows to call the clinic with any problems, questions or concerns. No barriers to learning was detected.    Heath Lark, MD 12/27/2013 9:16 PM

## 2013-12-27 NOTE — Assessment & Plan Note (Signed)
Continue conservative management with allopurinol and prednisone as needed. Today he has no evidence of acute gout attack.

## 2013-12-27 NOTE — Assessment & Plan Note (Signed)
I recommend observation only.

## 2013-12-27 NOTE — Telephone Encounter (Signed)
gv adn printed appt sched adn avs for pt for May adn June....sed added tx.

## 2013-12-28 ENCOUNTER — Ambulatory Visit (HOSPITAL_BASED_OUTPATIENT_CLINIC_OR_DEPARTMENT_OTHER): Payer: Medicare PPO

## 2013-12-28 VITALS — BP 144/71 | HR 60 | Temp 98.0°F | Resp 18

## 2013-12-28 DIAGNOSIS — Z5111 Encounter for antineoplastic chemotherapy: Secondary | ICD-10-CM

## 2013-12-28 DIAGNOSIS — C8310 Mantle cell lymphoma, unspecified site: Secondary | ICD-10-CM

## 2013-12-28 DIAGNOSIS — C8319 Mantle cell lymphoma, extranodal and solid organ sites: Secondary | ICD-10-CM

## 2013-12-28 MED ORDER — SODIUM CHLORIDE 0.9 % IV SOLN
90.0000 mg/m2 | Freq: Once | INTRAVENOUS | Status: AC
  Administered 2013-12-28: 189 mg via INTRAVENOUS
  Filled 2013-12-28: qty 2.1

## 2013-12-28 MED ORDER — ACETAMINOPHEN 325 MG PO TABS
650.0000 mg | ORAL_TABLET | Freq: Once | ORAL | Status: AC
  Administered 2013-12-28: 650 mg via ORAL

## 2013-12-28 MED ORDER — SODIUM CHLORIDE 0.9 % IJ SOLN
10.0000 mL | INTRAMUSCULAR | Status: DC | PRN
  Administered 2013-12-28: 10 mL
  Filled 2013-12-28: qty 10

## 2013-12-28 MED ORDER — DIPHENHYDRAMINE HCL 25 MG PO CAPS
50.0000 mg | ORAL_CAPSULE | Freq: Once | ORAL | Status: AC
  Administered 2013-12-28: 50 mg via ORAL

## 2013-12-28 MED ORDER — ACETAMINOPHEN 325 MG PO TABS
ORAL_TABLET | ORAL | Status: AC
  Filled 2013-12-28: qty 2

## 2013-12-28 MED ORDER — ONDANSETRON 8 MG/NS 50 ML IVPB
INTRAVENOUS | Status: AC
  Filled 2013-12-28: qty 8

## 2013-12-28 MED ORDER — SODIUM CHLORIDE 0.9 % IV SOLN
Freq: Once | INTRAVENOUS | Status: AC
  Administered 2013-12-28: 09:00:00 via INTRAVENOUS

## 2013-12-28 MED ORDER — ONDANSETRON 8 MG/50ML IVPB (CHCC)
8.0000 mg | Freq: Once | INTRAVENOUS | Status: AC
  Administered 2013-12-28: 8 mg via INTRAVENOUS

## 2013-12-28 MED ORDER — SODIUM CHLORIDE 0.9 % IV SOLN
375.0000 mg/m2 | Freq: Once | INTRAVENOUS | Status: AC
  Administered 2013-12-28: 800 mg via INTRAVENOUS
  Filled 2013-12-28: qty 80

## 2013-12-28 MED ORDER — DIPHENHYDRAMINE HCL 25 MG PO CAPS
ORAL_CAPSULE | ORAL | Status: AC
  Filled 2013-12-28: qty 2

## 2013-12-28 MED ORDER — DEXAMETHASONE SODIUM PHOSPHATE 10 MG/ML IJ SOLN
10.0000 mg | Freq: Once | INTRAMUSCULAR | Status: AC
  Administered 2013-12-28: 10 mg via INTRAVENOUS

## 2013-12-28 MED ORDER — DEXAMETHASONE SODIUM PHOSPHATE 10 MG/ML IJ SOLN
INTRAMUSCULAR | Status: AC
  Filled 2013-12-28: qty 1

## 2013-12-28 MED ORDER — HEPARIN SOD (PORK) LOCK FLUSH 100 UNIT/ML IV SOLN
500.0000 [IU] | Freq: Once | INTRAVENOUS | Status: AC | PRN
  Administered 2013-12-28: 500 [IU]
  Filled 2013-12-28: qty 5

## 2013-12-28 NOTE — Patient Instructions (Signed)
[facility] Discharge Instructions for Patients Receiving Chemotherapy  Today you received the following chemotherapy agents: Rituxan, Treanda  To help prevent nausea and vomiting after your treatment, we encourage you to take your nausea medication as prescribed.    If you develop nausea and vomiting that is not controlled by your nausea medication, call the clinic.   BELOW ARE SYMPTOMS THAT SHOULD BE REPORTED IMMEDIATELY:  *FEVER GREATER THAN 100.5 F  *CHILLS WITH OR WITHOUT FEVER  NAUSEA AND VOMITING THAT IS NOT CONTROLLED WITH YOUR NAUSEA MEDICATION  *UNUSUAL SHORTNESS OF BREATH  *UNUSUAL BRUISING OR BLEEDING  TENDERNESS IN MOUTH AND THROAT WITH OR WITHOUT PRESENCE OF ULCERS  *URINARY PROBLEMS  *BOWEL PROBLEMS  UNUSUAL RASH Items with * indicate a potential emergency and should be followed up as soon as possible.  Feel free to call the clinic you have any questions or concerns. The clinic phone number is [phone].    

## 2013-12-28 NOTE — Progress Notes (Signed)
Copy of lab work given to pt in infusion room.  Informed him of thyroid levels wnl.  Pt verbalized understanding.

## 2013-12-29 ENCOUNTER — Other Ambulatory Visit: Payer: Self-pay | Admitting: Oncology

## 2013-12-29 ENCOUNTER — Ambulatory Visit (HOSPITAL_BASED_OUTPATIENT_CLINIC_OR_DEPARTMENT_OTHER): Payer: Medicare PPO

## 2013-12-29 VITALS — BP 158/95 | HR 76 | Temp 98.4°F

## 2013-12-29 DIAGNOSIS — C8310 Mantle cell lymphoma, unspecified site: Secondary | ICD-10-CM

## 2013-12-29 DIAGNOSIS — Z5111 Encounter for antineoplastic chemotherapy: Secondary | ICD-10-CM

## 2013-12-29 DIAGNOSIS — C8319 Mantle cell lymphoma, extranodal and solid organ sites: Secondary | ICD-10-CM

## 2013-12-29 LAB — URIC ACID (CC13): URIC ACID, SERUM: 7.3 mg/dL (ref 2.6–7.4)

## 2013-12-29 MED ORDER — SODIUM CHLORIDE 0.9 % IV SOLN
90.0000 mg/m2 | Freq: Once | INTRAVENOUS | Status: AC
  Administered 2013-12-29: 189 mg via INTRAVENOUS
  Filled 2013-12-29: qty 2.1

## 2013-12-29 MED ORDER — ONDANSETRON 8 MG/50ML IVPB (CHCC)
8.0000 mg | Freq: Once | INTRAVENOUS | Status: AC
  Administered 2013-12-29: 8 mg via INTRAVENOUS

## 2013-12-29 MED ORDER — ONDANSETRON 8 MG/NS 50 ML IVPB
INTRAVENOUS | Status: AC
  Filled 2013-12-29: qty 8

## 2013-12-29 MED ORDER — HEPARIN SOD (PORK) LOCK FLUSH 100 UNIT/ML IV SOLN
500.0000 [IU] | Freq: Once | INTRAVENOUS | Status: AC | PRN
  Administered 2013-12-29: 500 [IU]
  Filled 2013-12-29: qty 5

## 2013-12-29 MED ORDER — SODIUM CHLORIDE 0.9 % IJ SOLN
10.0000 mL | INTRAMUSCULAR | Status: DC | PRN
  Administered 2013-12-29: 10 mL
  Filled 2013-12-29: qty 10

## 2013-12-29 MED ORDER — SODIUM CHLORIDE 0.9 % IV SOLN
Freq: Once | INTRAVENOUS | Status: AC
  Administered 2013-12-29: 11:00:00 via INTRAVENOUS

## 2013-12-29 MED ORDER — DEXAMETHASONE SODIUM PHOSPHATE 10 MG/ML IJ SOLN
10.0000 mg | Freq: Once | INTRAMUSCULAR | Status: AC
  Administered 2013-12-29: 10 mg via INTRAVENOUS

## 2013-12-29 MED ORDER — DEXAMETHASONE SODIUM PHOSPHATE 10 MG/ML IJ SOLN
INTRAMUSCULAR | Status: AC
  Filled 2013-12-29: qty 1

## 2013-12-29 NOTE — Addendum Note (Signed)
Addended by: [place] on: 12/29/2013 01:36 PM   Modules accepted: Orders

## 2013-12-29 NOTE — Patient Instructions (Signed)
[facility] Discharge Instructions for Patients Receiving Chemotherapy  Today you received the following chemotherapy agents: Treanda.  To help prevent nausea and vomiting after your treatment, we encourage you to take your nausea medication as prescribed.   If you develop nausea and vomiting that is not controlled by your nausea medication, call the clinic.   BELOW ARE SYMPTOMS THAT SHOULD BE REPORTED IMMEDIATELY:  *FEVER GREATER THAN 100.5 F  *CHILLS WITH OR WITHOUT FEVER  NAUSEA AND VOMITING THAT IS NOT CONTROLLED WITH YOUR NAUSEA MEDICATION  *UNUSUAL SHORTNESS OF BREATH  *UNUSUAL BRUISING OR BLEEDING  TENDERNESS IN MOUTH AND THROAT WITH OR WITHOUT PRESENCE OF ULCERS  *URINARY PROBLEMS  *BOWEL PROBLEMS  UNUSUAL RASH Items with * indicate a potential emergency and should be followed up as soon as possible.  Feel free to call the clinic you have any questions or concerns. The clinic phone number is [phone].    

## 2013-12-30 ENCOUNTER — Ambulatory Visit (HOSPITAL_BASED_OUTPATIENT_CLINIC_OR_DEPARTMENT_OTHER): Payer: Medicare PPO

## 2013-12-30 VITALS — BP 164/84 | HR 96 | Temp 97.9°F

## 2013-12-30 DIAGNOSIS — Z5189 Encounter for other specified aftercare: Secondary | ICD-10-CM

## 2013-12-30 DIAGNOSIS — C8319 Mantle cell lymphoma, extranodal and solid organ sites: Secondary | ICD-10-CM

## 2013-12-30 MED ORDER — PEGFILGRASTIM INJECTION 6 MG/0.6ML
6.0000 mg | Freq: Once | SUBCUTANEOUS | Status: AC
  Administered 2013-12-30: 6 mg via SUBCUTANEOUS

## 2013-12-30 NOTE — Patient Instructions (Signed)

## 2014-01-09 ENCOUNTER — Telehealth: Payer: Self-pay | Admitting: Hematology and Oncology

## 2014-01-09 NOTE — Telephone Encounter (Signed)
RETURNED PT DAUGHTER CALL AND ADVISED AND CONFIRMED ALL APPTS.Marland KitchenMarland KitchenOK AND AWARE

## 2014-01-24 ENCOUNTER — Other Ambulatory Visit: Payer: Medicare PPO

## 2014-01-24 ENCOUNTER — Ambulatory Visit (HOSPITAL_COMMUNITY)
Admission: RE | Admit: 2014-01-24 | Discharge: 2014-01-24 | Disposition: A | Discharge status: Home / Self Care | Payer: Medicare PPO | Source: Ambulatory Visit | Attending: Hematology and Oncology | Admitting: Hematology and Oncology

## 2014-01-24 ENCOUNTER — Ambulatory Visit: Payer: Medicare PPO | Admitting: Hematology and Oncology

## 2014-01-24 ENCOUNTER — Ambulatory Visit: Payer: Medicare PPO

## 2014-01-24 DIAGNOSIS — C8588 Other specified types of non-Hodgkin lymphoma, lymph nodes of multiple sites: Secondary | ICD-10-CM

## 2014-01-24 DIAGNOSIS — I2510 Atherosclerotic heart disease of native coronary artery without angina pectoris: Secondary | ICD-10-CM | POA: Insufficient documentation

## 2014-01-24 DIAGNOSIS — C8319 Mantle cell lymphoma, extranodal and solid organ sites: Secondary | ICD-10-CM | POA: Insufficient documentation

## 2014-01-24 DIAGNOSIS — D7389 Other diseases of spleen: Secondary | ICD-10-CM | POA: Insufficient documentation

## 2014-01-24 LAB — GLUCOSE, CAPILLARY: GLUCOSE-CAPILLARY: 103 mg/dL — AB (ref 70–99)

## 2014-01-24 MED ORDER — FLUDEOXYGLUCOSE F - 18 (FDG) INJECTION
10.6000 | Freq: Once | INTRAVENOUS | Status: AC | PRN
  Administered 2014-01-24: 10.6 via INTRAVENOUS

## 2014-01-25 ENCOUNTER — Telehealth: Payer: Self-pay | Admitting: Hematology and Oncology

## 2014-01-25 ENCOUNTER — Other Ambulatory Visit: Payer: Self-pay | Admitting: Medical Oncology

## 2014-01-25 ENCOUNTER — Ambulatory Visit (HOSPITAL_BASED_OUTPATIENT_CLINIC_OR_DEPARTMENT_OTHER): Payer: Medicare PPO | Admitting: Hematology and Oncology

## 2014-01-25 ENCOUNTER — Ambulatory Visit (HOSPITAL_BASED_OUTPATIENT_CLINIC_OR_DEPARTMENT_OTHER): Payer: Medicare PPO

## 2014-01-25 ENCOUNTER — Ambulatory Visit: Payer: Medicare PPO

## 2014-01-25 ENCOUNTER — Encounter: Payer: Self-pay | Admitting: Hematology and Oncology

## 2014-01-25 ENCOUNTER — Other Ambulatory Visit: Payer: Self-pay | Admitting: Hematology and Oncology

## 2014-01-25 ENCOUNTER — Other Ambulatory Visit (HOSPITAL_BASED_OUTPATIENT_CLINIC_OR_DEPARTMENT_OTHER): Payer: Medicare PPO

## 2014-01-25 VITALS — BP 147/61 | HR 70 | Temp 97.5°F | Resp 18

## 2014-01-25 VITALS — BP 142/71 | HR 62 | Temp 98.0°F | Resp 20 | Ht 75.0 in | Wt 203.8 lb

## 2014-01-25 DIAGNOSIS — L80 Vitiligo: Secondary | ICD-10-CM

## 2014-01-25 DIAGNOSIS — F17200 Nicotine dependence, unspecified, uncomplicated: Secondary | ICD-10-CM

## 2014-01-25 DIAGNOSIS — C8310 Mantle cell lymphoma, unspecified site: Secondary | ICD-10-CM

## 2014-01-25 DIAGNOSIS — R7989 Other specified abnormal findings of blood chemistry: Secondary | ICD-10-CM

## 2014-01-25 DIAGNOSIS — C8588 Other specified types of non-Hodgkin lymphoma, lymph nodes of multiple sites: Secondary | ICD-10-CM

## 2014-01-25 DIAGNOSIS — E785 Hyperlipidemia, unspecified: Secondary | ICD-10-CM

## 2014-01-25 DIAGNOSIS — C8319 Mantle cell lymphoma, extranodal and solid organ sites: Secondary | ICD-10-CM

## 2014-01-25 DIAGNOSIS — Z5112 Encounter for antineoplastic immunotherapy: Secondary | ICD-10-CM

## 2014-01-25 DIAGNOSIS — Z720 Tobacco use: Secondary | ICD-10-CM

## 2014-01-25 LAB — URIC ACID (CC13): Uric Acid, Serum: 8.6 mg/dl — ABNORMAL HIGH (ref 2.6–7.4)

## 2014-01-25 LAB — CBC WITH DIFFERENTIAL/PLATELET
BASO%: 0.8 % (ref 0.0–2.0)
Basophils Absolute: 0.1 10*3/uL (ref 0.0–0.1)
EOS%: 5.3 % (ref 0.0–7.0)
Eosinophils Absolute: 0.5 10*3/uL (ref 0.0–0.5)
HCT: 39.6 % (ref 38.4–49.9)
HGB: 13.5 g/dL (ref 13.0–17.1)
LYMPH%: 7.9 % — ABNORMAL LOW (ref 14.0–49.0)
MCH: 31.5 pg (ref 27.2–33.4)
MCHC: 34.1 g/dL (ref 32.0–36.0)
MCV: 92.3 fL (ref 79.3–98.0)
MONO#: 0.9 10*3/uL (ref 0.1–0.9)
MONO%: 8.6 % (ref 0.0–14.0)
NEUT#: 7.8 10*3/uL — ABNORMAL HIGH (ref 1.5–6.5)
NEUT%: 77.4 % — ABNORMAL HIGH (ref 39.0–75.0)
Platelets: 222 10*3/uL (ref 140–400)
RBC: 4.29 10*6/uL (ref 4.20–5.82)
RDW: 14.4 % (ref 11.0–14.6)
WBC: 10.1 10*3/uL (ref 4.0–10.3)
lymph#: 0.8 10*3/uL — ABNORMAL LOW (ref 0.9–3.3)

## 2014-01-25 LAB — COMPREHENSIVE METABOLIC PANEL (CC13)
ALT: 12 U/L (ref 0–55)
AST: 13 U/L (ref 5–34)
Albumin: 3.7 g/dL (ref 3.5–5.0)
Alkaline Phosphatase: 79 U/L (ref 40–150)
Anion Gap: 10 mEq/L (ref 3–11)
BUN: 19.1 mg/dL (ref 7.0–26.0)
CALCIUM: 10.1 mg/dL (ref 8.4–10.4)
CHLORIDE: 107 meq/L (ref 98–109)
CO2: 25 mEq/L (ref 22–29)
CREATININE: 1.3 mg/dL (ref 0.7–1.3)
Glucose: 94 mg/dl (ref 70–140)
Potassium: 4.4 mEq/L (ref 3.5–5.1)
SODIUM: 142 meq/L (ref 136–145)
TOTAL PROTEIN: 7.3 g/dL (ref 6.4–8.3)
Total Bilirubin: 0.52 mg/dL (ref 0.20–1.20)

## 2014-01-25 LAB — LACTATE DEHYDROGENASE (CC13): LDH: 174 U/L (ref 125–245)

## 2014-01-25 MED ORDER — SODIUM CHLORIDE 0.9 % IV SOLN
375.0000 mg/m2 | Freq: Once | INTRAVENOUS | Status: AC
  Administered 2014-01-25: 800 mg via INTRAVENOUS
  Filled 2014-01-25: qty 80

## 2014-01-25 MED ORDER — SODIUM CHLORIDE 0.9 % IV SOLN
3.0000 mg | Freq: Once | INTRAVENOUS | Status: AC
  Administered 2014-01-25: 3 mg via INTRAVENOUS
  Filled 2014-01-25: qty 2

## 2014-01-25 MED ORDER — DIPHENHYDRAMINE HCL 25 MG PO CAPS
50.0000 mg | ORAL_CAPSULE | Freq: Once | ORAL | Status: AC
  Administered 2014-01-25: 50 mg via ORAL

## 2014-01-25 MED ORDER — ONDANSETRON 8 MG/50ML IVPB (CHCC)
8.0000 mg | Freq: Once | INTRAVENOUS | Status: AC
  Administered 2014-01-25: 8 mg via INTRAVENOUS

## 2014-01-25 MED ORDER — DEXAMETHASONE SODIUM PHOSPHATE 10 MG/ML IJ SOLN
INTRAMUSCULAR | Status: AC
  Filled 2014-01-25: qty 1

## 2014-01-25 MED ORDER — SODIUM CHLORIDE 0.9 % IV SOLN
90.0000 mg/m2 | Freq: Once | INTRAVENOUS | Status: AC
  Administered 2014-01-25: 189 mg via INTRAVENOUS
  Filled 2014-01-25: qty 2.1

## 2014-01-25 MED ORDER — ACETAMINOPHEN 325 MG PO TABS
ORAL_TABLET | ORAL | Status: AC
  Filled 2014-01-25: qty 2

## 2014-01-25 MED ORDER — DIPHENHYDRAMINE HCL 25 MG PO CAPS
ORAL_CAPSULE | ORAL | Status: AC
  Filled 2014-01-25: qty 2

## 2014-01-25 MED ORDER — SODIUM CHLORIDE 0.9 % IJ SOLN
10.0000 mL | INTRAMUSCULAR | Status: DC | PRN
  Administered 2014-01-25: 10 mL
  Filled 2014-01-25: qty 10

## 2014-01-25 MED ORDER — SODIUM CHLORIDE 0.9 % IV SOLN
Freq: Once | INTRAVENOUS | Status: AC
  Administered 2014-01-25: 11:00:00 via INTRAVENOUS

## 2014-01-25 MED ORDER — HEPARIN SOD (PORK) LOCK FLUSH 100 UNIT/ML IV SOLN
500.0000 [IU] | Freq: Once | INTRAVENOUS | Status: AC | PRN
  Administered 2014-01-25: 500 [IU]
  Filled 2014-01-25: qty 5

## 2014-01-25 MED ORDER — ACETAMINOPHEN 325 MG PO TABS
650.0000 mg | ORAL_TABLET | Freq: Once | ORAL | Status: AC
  Administered 2014-01-25: 650 mg via ORAL

## 2014-01-25 MED ORDER — DEXAMETHASONE SODIUM PHOSPHATE 10 MG/ML IJ SOLN
10.0000 mg | Freq: Once | INTRAMUSCULAR | Status: AC
  Administered 2014-01-25: 10 mg via INTRAVENOUS

## 2014-01-25 MED ORDER — ONDANSETRON 8 MG/NS 50 ML IVPB
INTRAVENOUS | Status: AC
  Filled 2014-01-25: qty 8

## 2014-01-25 NOTE — Assessment & Plan Note (Signed)
Uric acid level is high. To prevent another episode of gout, I would give him raburicase today.

## 2014-01-25 NOTE — Patient Instructions (Addendum)
[facility] Discharge Instructions for Patients Receiving Chemotherapy  Today you received the following chemotherapy agents: Rituxan, Treanda  To help prevent nausea and vomiting after your treatment, we encourage you to take your nausea medication as prescribed by your physician.    If you develop nausea and vomiting that is not controlled by your nausea medication, call the clinic.   BELOW ARE SYMPTOMS THAT SHOULD BE REPORTED IMMEDIATELY:  *FEVER GREATER THAN 100.5 F  *CHILLS WITH OR WITHOUT FEVER  NAUSEA AND VOMITING THAT IS NOT CONTROLLED WITH YOUR NAUSEA MEDICATION  *UNUSUAL SHORTNESS OF BREATH  *UNUSUAL BRUISING OR BLEEDING  TENDERNESS IN MOUTH AND THROAT WITH OR WITHOUT PRESENCE OF ULCERS  *URINARY PROBLEMS  *BOWEL PROBLEMS  UNUSUAL RASH Items with * indicate a potential emergency and should be followed up as soon as possible.  Feel free to call the clinic you have any questions or concerns. The clinic phone number is [phone].   Rasburicase Injection (Elitek) What is this medicine? RASBURICASE (ras BURE i kase) breaks down uric acid in the blood. It is used to prevent and to treat high levels of uric acid caused by cancer treatment. This medicine may be used for other purposes; ask your health care provider or pharmacist if you have questions. COMMON BRAND NAME(S): Elitek What should I tell my health care provider before I take this medicine? They need to know if you have any of these conditions: -G6PD deficiency -history of anemia -history of blood transfusion -an unusual or allergic reaction to rasburicase, yeast, mannitol, other medicines, foods, dyes, or preservatives -pregnant or trying to get pregnant -breast-feeding How should I use this medicine? This medicine is for infusion into a vein. It is given by a health care professional in a hospital or clinic setting. Talk to your pediatrician regarding the use of this medicine in  children. While this drug may be prescribed for children as young as 76 month old for selected conditions, precautions do apply. Overdosage: If you think you have taken too much of this medicine contact a poison control center or emergency room at once. NOTE: This medicine is only for you. Do not share this medicine with others. What if I miss a dose? This does not apply. What may interact with this medicine? -allopurinol This list may not describe all possible interactions. Give your health care provider a list of all the medicines, herbs, non-prescription drugs, or dietary supplements you use. Also tell them if you smoke, drink alcohol, or use illegal drugs. Some items may interact with your medicine. What should I watch for while using this medicine? Your condition will be monitored carefully while you are receiving this medicine. You will need to have regular blood tests during your treatment. What side effects may I notice from receiving this medicine? Side effects that you should report to your doctor or health care professional as soon as possible: -allergic reactions like skin rash, itching or hives, swelling of the face, lips, or tongue -blue color to lips or nailbeds -breathing problems -chest pain, tightness -fast, irregular heartbeat -feeling faint or lightheaded, falls -fever -low blood pressure -seizures -trouble passing urine or change in the amount of urine -yellowing of the eyes or skin Side effects that usually do not require medical attention (report to your doctor or health care professional if they continue or are bothersome): -constipation or diarrhea -diarrhea -headache -mouth sores -nausea, vomiting This list may not describe all possible side effects. Call your doctor for medical  advice about side effects. You may report side effects to FDA at 1-800-FDA-1088. Where should I keep my medicine? This drug is given in a hospital or clinic and will not be stored at  home. NOTE: This sheet is a summary. It may not cover all possible information. If you have questions about this medicine, talk to your doctor, pharmacist, or health care provider.  2015, Elsevier/Gold Standard. (2008-03-19 14:22:08)

## 2014-01-25 NOTE — Progress Notes (Signed)
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 In addition, there is moderate splenomegaly with associated hypermetabolic activity consistent with lymphomatous involvement   09/21/2013 -  Chemotherapy The patient will start on cycle 1 of bendamustine and rituximab   01/24/2014 Imaging Repeat PET/CT scan showed near complete response to treatment.    INTERVAL HISTORY: Please see below for problem oriented charting. He is seen prior to cycle 6 of treatment. He complained of some mild neck pain. Overall, he has no symptoms from last chemotherapy. Denies any recurrence of counts.  REVIEW OF SYSTEMS:   Constitutional: Denies fevers, chills or abnormal weight loss Eyes: Denies blurriness of vision Ears, nose, mouth, throat, and face: Denies mucositis or sore throat Respiratory: Denies cough, dyspnea or wheezes Cardiovascular: Denies palpitation, chest discomfort or lower extremity swelling Gastrointestinal:  Denies nausea, heartburn or change in bowel habits Lymphatics: Denies new lymphadenopathy or easy bruising Neurological:Denies numbness, tingling or new  weaknesses Behavioral/Psych: Mood is stable, no new changes  All other systems were reviewed with the patient and are negative.  I have reviewed the past medical history, past surgical history, social history and family history with the patient and they are unchanged from previous note.  ALLERGIES:  has No Known Allergies.  MEDICATIONS:  Current Outpatient Prescriptions  Medication Sig Dispense Refill  . acyclovir (ZOVIRAX) 400 MG tablet Take 1 tablet (400 mg total) by mouth daily.  90 tablet  3  . Cholecalciferol (VITAMIN D PO) Take 2,000 Units by mouth.       . levothyroxine (SYNTHROID, LEVOTHROID) 75 MCG tablet Take 1 tablet (75 mcg total) by mouth daily.  90 tablet  3  . allopurinol (ZYLOPRIM) 300 MG tablet Take 1 tablet (300 mg total) by mouth daily.  30 tablet  3  . lidocaine-prilocaine (EMLA) cream Apply 1 application topically as needed. Apply to port a cath site one hour prior to needle stick.  30 g  0  . ondansetron (ZOFRAN) 8 MG tablet Take 1 tablet (8 mg total) by mouth every 8 (eight) hours as needed for nausea.  30 tablet  1  . oxyCODONE (ROXICODONE) 15 MG immediate release tablet Take 1 tablet (15 mg total) by mouth every 4 (four) hours as needed for severe pain.  60 tablet  0  . polyethylene glycol (MIRALAX / GLYCOLAX) packet Take 17 g by mouth daily as needed.      . predniSONE (DELTASONE) 20 MG tablet Take 20 mg by mouth daily as needed. Gout flaring.      . prochlorperazine (COMPAZINE) 10 MG tablet Take 1 tablet (10 mg total) by mouth every 6 (six) hours as needed (Nausea or vomiting).  30 tablet  1   No current facility-administered medications for this visit.   Facility-Administered Medications Ordered in Other Visits  Medication Dose Route Frequency Provider Last Rate Last Dose  . bendamustine (TREANDA) 189 mg in sodium chloride 0.9 % 500 mL chemo infusion  90 mg/m2 (Treatment Plan Actual) Intravenous Once Heath Lark, MD      . heparin lock flush 100 unit/mL  500  Units Intracatheter Once PRN Heath Lark, MD      . sodium chloride 0.9 % 48 mL with rasburicase (ELITEK) 3 mg  3 mg Intravenous Once Heath Lark, MD   3 mg at 01/25/14 1446  . sodium chloride 0.9 % injection 10 mL  10 mL Intracatheter PRN Heath Lark, MD   10 mL at 09/21/13 1646  . sodium chloride 0.9 % injection 10 mL  10 mL Intracatheter PRN Heath Lark, MD        PHYSICAL EXAMINATION: ECOG PERFORMANCE STATUS: 0 - Asymptomatic  Filed Vitals:   01/25/14 1007  BP: 142/71  Pulse: 62  Temp: 98 F (36.7 C)  Resp: 20   Filed Weights   01/25/14 1007  Weight: 203 lb 12.8 oz (92.443 kg)    GENERAL:alert, no distress and comfortable SKIN: Noted for glycol on his thigh. No other rash elsewhere. EYES: normal, Conjunctiva are pink and non-injected, sclera clear OROPHARYNX:no exudate, no erythema and lips, buccal mucosa, and tongue normal  NECK: supple, thyroid normal size, non-tender, without nodularity. No palpable abnormalities to explain his neck pain. LYMPH:  no palpable lymphadenopathy in the cervical, axillary or inguinal LUNGS: clear to auscultation and percussion with normal breathing effort HEART: regular rate & rhythm and no murmurs and no lower extremity edema ABDOMEN:abdomen soft, non-tender and normal bowel sounds Musculoskeletal:no cyanosis of digits and no clubbing  NEURO: alert & oriented x 3 with fluent speech, no focal motor/sensory deficits  LABORATORY DATA:  I have reviewed the data as listed    Component Value Date/Time   NA 142 01/25/2014 0948   K 4.4 01/25/2014 0948   CO2 25 01/25/2014 0948   GLUCOSE 94 01/25/2014 0948   BUN 19.1 01/25/2014 0948   CREATININE 1.3 01/25/2014 0948   CALCIUM 10.1 01/25/2014 0948   PROT 7.3 01/25/2014 0948   ALBUMIN 3.7 01/25/2014 0948   AST 13 01/25/2014 0948   ALT 12 01/25/2014 0948   ALKPHOS 79 01/25/2014 0948   BILITOT 0.52 01/25/2014 0948    No results found for this basename: SPEP, UPEP,  kappa and lambda light chains    Lab  Results  Component Value Date   WBC 10.1 01/25/2014   NEUTROABS 7.8* 01/25/2014   HGB 13.5 01/25/2014   HCT 39.6 01/25/2014   MCV 92.3 01/25/2014   PLT 222 01/25/2014      Chemistry      Component Value Date/Time   NA 142 01/25/2014 0948   K 4.4 01/25/2014 0948   CO2 25 01/25/2014 0948   BUN 19.1 01/25/2014 0948   CREATININE 1.3 01/25/2014 0948      Component Value Date/Time   CALCIUM 10.1 01/25/2014 0948   ALKPHOS 79 01/25/2014 0948   AST 13 01/25/2014 0948   ALT 12 01/25/2014 0948   BILITOT 0.52 01/25/2014 0948       RADIOGRAPHIC STUDIES: I reviewed the imaging study with him and his daughter. I have personally reviewed the radiological images as listed and agreed with the findings in the report. Nm Pet Image Restag (ps) Skull Base To Thigh  01/24/2014  CLINICAL DATA:  Subsequent treatment strategy for mantle cell lymphoma.  EXAM: NUCLEAR MEDICINE PET SKULL BASE TO THIGH  TECHNIQUE: 10.6 mCi F-18 FDG was injected intravenously. Full-ring PET imaging was performed from the skull base to thigh after the radiotracer. CT data was obtained and used for attenuation correction and anatomic localization.  FASTING BLOOD GLUCOSE:  Value: 103 mg/dl  COMPARISON:  09/16/2013.  FINDINGS: NECK  No hypermetabolic lymph nodes in the neck. CT images show no acute findings.  CHEST  No hypermetabolic mediastinal, hilar or axillary lymph nodes. No hypermetabolic pulmonary nodules.  CT images show a right IJ Port-A-Cath terminating in the SVC. Coronary artery calcification with predominant involvement of the left anterior descending coronary artery. Heart size normal. No pericardial effusion. Mild biapical pleural parenchymal scarring. Given normal respiratory motion, lungs are otherwise grossly clear. No pleural fluid. Airway is unremarkable.  ABDOMEN/PELVIS  No abnormal hypermetabolism in the liver, adrenal glands, spleen or pancreas. No hypermetabolic lymph nodes. Specifically, previously seen hypermetabolic  abdominal and pelvic retroperitoneal adenopathy has resolved.  CT images show the liver, gallbladder, adrenal glands and kidneys to be grossly unremarkable. Spleen has decreased in size, now measuring 8.4 x 15.4 x 12.1 cm (previously 11.1 x 18.9 x 19.1 cm). Areas of low attenuation previously seen in the spleen have improved as well with a single residual lesion seen posteriorly, measuring 2.7 cm (previously 5.9 cm).  Pancreas, stomach and bowel are unremarkable. Prostate is unremarkable. Bladder is decompressed. Atherosclerotic calcification of the arterial vasculature without abdominal aortic aneurysm. No free fluid. Retro aortic left renal vein is incidentally noted.  SKELETON  No abnormal osseous hypermetabolism. Degenerative changes in the spine and hips. 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  IMPRESSION: Interval response to therapy as evidenced by resolution of hypermetabolic lymph nodes in the neck, chest, abdomen and pelvis, as well as improving splenomegaly. Areas of low attenuation within the spleen have improved as well.   Electronically Signed   By: Lorin Picket M.D.   On: 01/24/2014 10:54     ASSESSMENT & PLAN:  Mantle cell lymphoma We reviewed the imaging study today. The patient has achieved near complete response. I recommend proceed with cycle 6 of treatment. We discussed about the role of bone marrow transplant. Ultimately, the patient hasn't decided. He agreed to proceed with last cycle of treatment. Unless I hear back from him, I plan to see him back in 3 months with history, physical examination and blood work.  Vitiligo Clinically, he is not symptomatic. I recommend observation only.  Tobacco abuse I spent some time counseling the patient the importance of tobacco cessation. he is currently attempting to quit on his own   Gout Uric acid level is high. To prevent another episode of gout, I would give him raburicase today.  Hyperlipidemia He  can resume pravastatin after completion of chemotherapy.    All questions were answered. The patient knows to call the clinic with any problems, questions or concerns. No barriers to learning was detected. I spent 40 minutes counseling the patient face to face. The total time spent in the appointment was 55 minutes and more than 50% was on counseling and review of test results     [facility], [place], MD 01/25/2014 2:55 PM

## 2014-01-25 NOTE — Assessment & Plan Note (Signed)
I spent some time counseling the patient the importance of tobacco cessation. he is currently attempting to quit on his own 

## 2014-01-25 NOTE — Assessment & Plan Note (Signed)
He can resume pravastatin after completion of chemotherapy.

## 2014-01-25 NOTE — Assessment & Plan Note (Signed)
Clinically, he is not symptomatic. I recommend observation only.

## 2014-01-25 NOTE — Telephone Encounter (Signed)
lmonvm advising the pt that his July,aug appts are available to be picked up.

## 2014-01-25 NOTE — Telephone Encounter (Signed)
Pt is aware of his aug and sept appts

## 2014-01-25 NOTE — Assessment & Plan Note (Signed)
We reviewed the imaging study today. The patient has achieved near complete response. I recommend proceed with cycle 6 of treatment. We discussed about the role of bone marrow transplant. Ultimately, the patient hasn't decided. He agreed to proceed with last cycle of treatment. Unless I hear back from him, I plan to see him back in 3 months with history, physical examination and blood work.

## 2014-01-26 ENCOUNTER — Other Ambulatory Visit: Payer: Self-pay | Admitting: Hematology and Oncology

## 2014-01-26 ENCOUNTER — Ambulatory Visit (HOSPITAL_BASED_OUTPATIENT_CLINIC_OR_DEPARTMENT_OTHER): Payer: Medicare PPO

## 2014-01-26 VITALS — BP 156/78 | HR 91 | Temp 98.4°F

## 2014-01-26 DIAGNOSIS — C8319 Mantle cell lymphoma, extranodal and solid organ sites: Secondary | ICD-10-CM

## 2014-01-26 DIAGNOSIS — Z5111 Encounter for antineoplastic chemotherapy: Secondary | ICD-10-CM

## 2014-01-26 DIAGNOSIS — C8310 Mantle cell lymphoma, unspecified site: Secondary | ICD-10-CM

## 2014-01-26 MED ORDER — SODIUM CHLORIDE 0.9 % IV SOLN
Freq: Once | INTRAVENOUS | Status: AC
  Administered 2014-01-26: 11:00:00 via INTRAVENOUS

## 2014-01-26 MED ORDER — DEXAMETHASONE SODIUM PHOSPHATE 10 MG/ML IJ SOLN
10.0000 mg | Freq: Once | INTRAMUSCULAR | Status: AC
  Administered 2014-01-26: 10 mg via INTRAVENOUS

## 2014-01-26 MED ORDER — DEXAMETHASONE SODIUM PHOSPHATE 10 MG/ML IJ SOLN
INTRAMUSCULAR | Status: AC
  Filled 2014-01-26: qty 1

## 2014-01-26 MED ORDER — HEPARIN SOD (PORK) LOCK FLUSH 100 UNIT/ML IV SOLN
500.0000 [IU] | Freq: Once | INTRAVENOUS | Status: AC | PRN
  Administered 2014-01-26: 500 [IU]
  Filled 2014-01-26: qty 5

## 2014-01-26 MED ORDER — SODIUM CHLORIDE 0.9 % IV SOLN
90.0000 mg/m2 | Freq: Once | INTRAVENOUS | Status: AC
  Administered 2014-01-26: 189 mg via INTRAVENOUS
  Filled 2014-01-26: qty 2.1

## 2014-01-26 MED ORDER — ONDANSETRON 8 MG/NS 50 ML IVPB
INTRAVENOUS | Status: AC
  Filled 2014-01-26: qty 8

## 2014-01-26 MED ORDER — ONDANSETRON 8 MG/50ML IVPB (CHCC)
8.0000 mg | Freq: Once | INTRAVENOUS | Status: AC
  Administered 2014-01-26: 8 mg via INTRAVENOUS

## 2014-01-26 MED ORDER — SODIUM CHLORIDE 0.9 % IJ SOLN
10.0000 mL | INTRAMUSCULAR | Status: DC | PRN
  Administered 2014-01-26: 10 mL
  Filled 2014-01-26: qty 10

## 2014-01-26 NOTE — Patient Instructions (Signed)
[facility] Discharge Instructions for Patients Receiving Chemotherapy  Today you received the following chemotherapy agents treanda.   To help prevent nausea and vomiting after your treatment, we encourage you to take your nausea medication as directed.    If you develop nausea and vomiting that is not controlled by your nausea medication, call the clinic.   BELOW ARE SYMPTOMS THAT SHOULD BE REPORTED IMMEDIATELY:  *FEVER GREATER THAN 100.5 F  *CHILLS WITH OR WITHOUT FEVER  NAUSEA AND VOMITING THAT IS NOT CONTROLLED WITH YOUR NAUSEA MEDICATION  *UNUSUAL SHORTNESS OF BREATH  *UNUSUAL BRUISING OR BLEEDING  TENDERNESS IN MOUTH AND THROAT WITH OR WITHOUT PRESENCE OF ULCERS  *URINARY PROBLEMS  *BOWEL PROBLEMS  UNUSUAL RASH Items with * indicate a potential emergency and should be followed up as soon as possible.  Feel free to call the clinic you have any questions or concerns. The clinic phone number is [phone].  

## 2014-01-27 ENCOUNTER — Ambulatory Visit (HOSPITAL_BASED_OUTPATIENT_CLINIC_OR_DEPARTMENT_OTHER): Payer: Medicare PPO

## 2014-01-27 VITALS — BP 183/78 | HR 91 | Temp 97.8°F | Resp 18

## 2014-01-27 DIAGNOSIS — C8319 Mantle cell lymphoma, extranodal and solid organ sites: Secondary | ICD-10-CM

## 2014-01-27 DIAGNOSIS — Z5189 Encounter for other specified aftercare: Secondary | ICD-10-CM

## 2014-01-27 MED ORDER — PEGFILGRASTIM INJECTION 6 MG/0.6ML
6.0000 mg | Freq: Once | SUBCUTANEOUS | Status: AC
  Administered 2014-01-27: 6 mg via SUBCUTANEOUS

## 2014-01-28 ENCOUNTER — Encounter (HOSPITAL_COMMUNITY): Payer: Self-pay | Admitting: Emergency Medicine

## 2014-01-28 ENCOUNTER — Emergency Department (HOSPITAL_COMMUNITY)
Admission: EM | Admit: 2014-01-28 | Discharge: 2014-01-28 | Disposition: A | Discharge status: Home / Self Care | Payer: Medicare PPO | Attending: Emergency Medicine | Admitting: Emergency Medicine

## 2014-01-28 ENCOUNTER — Emergency Department (HOSPITAL_COMMUNITY): Payer: Medicare PPO

## 2014-01-28 DIAGNOSIS — Z87898 Personal history of other specified conditions: Secondary | ICD-10-CM | POA: Insufficient documentation

## 2014-01-28 DIAGNOSIS — R5383 Other fatigue: Secondary | ICD-10-CM

## 2014-01-28 DIAGNOSIS — E039 Hypothyroidism, unspecified: Secondary | ICD-10-CM | POA: Insufficient documentation

## 2014-01-28 DIAGNOSIS — M109 Gout, unspecified: Secondary | ICD-10-CM | POA: Insufficient documentation

## 2014-01-28 DIAGNOSIS — F329 Major depressive disorder, single episode, unspecified: Secondary | ICD-10-CM | POA: Insufficient documentation

## 2014-01-28 DIAGNOSIS — F3289 Other specified depressive episodes: Secondary | ICD-10-CM | POA: Insufficient documentation

## 2014-01-28 DIAGNOSIS — Z79899 Other long term (current) drug therapy: Secondary | ICD-10-CM | POA: Insufficient documentation

## 2014-01-28 DIAGNOSIS — F17200 Nicotine dependence, unspecified, uncomplicated: Secondary | ICD-10-CM | POA: Insufficient documentation

## 2014-01-28 DIAGNOSIS — R509 Fever, unspecified: Secondary | ICD-10-CM | POA: Insufficient documentation

## 2014-01-28 DIAGNOSIS — Z8639 Personal history of other endocrine, nutritional and metabolic disease: Secondary | ICD-10-CM | POA: Insufficient documentation

## 2014-01-28 DIAGNOSIS — R197 Diarrhea, unspecified: Secondary | ICD-10-CM | POA: Insufficient documentation

## 2014-01-28 DIAGNOSIS — D849 Immunodeficiency, unspecified: Secondary | ICD-10-CM | POA: Insufficient documentation

## 2014-01-28 DIAGNOSIS — R5381 Other malaise: Secondary | ICD-10-CM | POA: Insufficient documentation

## 2014-01-28 DIAGNOSIS — Z8719 Personal history of other diseases of the digestive system: Secondary | ICD-10-CM | POA: Insufficient documentation

## 2014-01-28 LAB — CBC WITH DIFFERENTIAL/PLATELET
BASOS PCT: 0 % (ref 0–1)
Basophils Absolute: 0 10*3/uL (ref 0.0–0.1)
EOS PCT: 0 % (ref 0–5)
Eosinophils Absolute: 0.1 10*3/uL (ref 0.0–0.7)
HCT: 36.4 % — ABNORMAL LOW (ref 39.0–52.0)
HEMOGLOBIN: 12.6 g/dL — AB (ref 13.0–17.0)
LYMPHS ABS: 0.7 10*3/uL (ref 0.7–4.0)
Lymphocytes Relative: 3 % — ABNORMAL LOW (ref 12–46)
MCH: 31.6 pg (ref 26.0–34.0)
MCHC: 34.6 g/dL (ref 30.0–36.0)
MCV: 91.2 fL (ref 78.0–100.0)
MONO ABS: 1.3 10*3/uL — AB (ref 0.1–1.0)
Monocytes Relative: 6 % (ref 3–12)
Neutro Abs: 21.1 10*3/uL — ABNORMAL HIGH (ref 1.7–7.7)
Neutrophils Relative %: 91 % — ABNORMAL HIGH (ref 43–77)
Platelets: 151 10*3/uL (ref 150–400)
RBC: 3.99 MIL/uL — AB (ref 4.22–5.81)
RDW: 14.2 % (ref 11.5–15.5)
WBC: 23.2 10*3/uL — ABNORMAL HIGH (ref 4.0–10.5)

## 2014-01-28 LAB — COMPREHENSIVE METABOLIC PANEL
ALBUMIN: 3.4 g/dL — AB (ref 3.5–5.2)
ALT: 10 U/L (ref 0–53)
AST: 12 U/L (ref 0–37)
Alkaline Phosphatase: 77 U/L (ref 39–117)
BILIRUBIN TOTAL: 0.6 mg/dL (ref 0.3–1.2)
BUN: 19 mg/dL (ref 6–23)
CALCIUM: 9.4 mg/dL (ref 8.4–10.5)
CO2: 21 mEq/L (ref 19–32)
CREATININE: 1.42 mg/dL — AB (ref 0.50–1.35)
Chloride: 100 mEq/L (ref 96–112)
GFR calc Af Amer: 55 mL/min — ABNORMAL LOW (ref >90)
GFR calc non Af Amer: 48 mL/min — ABNORMAL LOW (ref >90)
Glucose, Bld: 118 mg/dL — ABNORMAL HIGH (ref 70–99)
Potassium: 3.4 mEq/L — ABNORMAL LOW (ref 3.7–5.3)
Sodium: 138 mEq/L (ref 137–147)
Total Protein: 6.9 g/dL (ref 6.0–8.3)

## 2014-01-28 LAB — URINALYSIS, ROUTINE W REFLEX MICROSCOPIC
BILIRUBIN URINE: NEGATIVE
Glucose, UA: NEGATIVE mg/dL
HGB URINE DIPSTICK: NEGATIVE
Ketones, ur: NEGATIVE mg/dL
Leukocytes, UA: NEGATIVE
NITRITE: NEGATIVE
Protein, ur: NEGATIVE mg/dL
SPECIFIC GRAVITY, URINE: 1.014 (ref 1.005–1.030)
UROBILINOGEN UA: 0.2 mg/dL (ref 0.0–1.0)
pH: 5.5 (ref 5.0–8.0)

## 2014-01-28 LAB — LIPASE, BLOOD: LIPASE: 15 U/L (ref 11–59)

## 2014-01-28 LAB — I-STAT CG4 LACTIC ACID, ED: Lactic Acid, Venous: 2.62 mmol/L — ABNORMAL HIGH (ref 0.5–2.2)

## 2014-01-28 MED ORDER — ACETAMINOPHEN 325 MG PO TABS
650.0000 mg | ORAL_TABLET | Freq: Four times a day (QID) | ORAL | Status: DC | PRN
  Administered 2014-01-28: 650 mg via ORAL
  Filled 2014-01-28: qty 2

## 2014-01-28 MED ORDER — SODIUM CHLORIDE 0.9 % IV BOLUS (SEPSIS)
1000.0000 mL | Freq: Once | INTRAVENOUS | Status: AC
  Administered 2014-01-28: 1000 mL via INTRAVENOUS

## 2014-01-28 MED ORDER — CIPROFLOXACIN HCL 250 MG PO TABS: 250.0000 mg | ORAL_TABLET | Freq: Two times a day (BID) | ORAL | Status: DC

## 2014-01-28 MED ORDER — SODIUM CHLORIDE 0.9 % IJ SOLN
INTRAMUSCULAR | Status: AC
  Administered 2014-01-28: 10 mL
  Filled 2014-01-28: qty 10

## 2014-01-28 MED ORDER — HEPARIN SOD (PORK) LOCK FLUSH 100 UNIT/ML IV SOLN
500.0000 [IU] | Freq: Once | INTRAVENOUS | Status: AC
  Administered 2014-01-28: 500 [IU]
  Filled 2014-01-28: qty 5

## 2014-01-28 MED ORDER — AMOXICILLIN-POT CLAVULANATE 875-125 MG PO TABS: 1.0000 | ORAL_TABLET | Freq: Two times a day (BID) | ORAL | Status: DC

## 2014-01-28 NOTE — ED Notes (Signed)
Pt brought to ed by daughter after running fever up to 101 at home with diarrhea x 3 onset yesterday. Tylenol given 2345 and 0200. Pt is chemo patient, last tx yesterday.

## 2014-01-28 NOTE — ED Provider Notes (Signed)
CSN: [id number]     Arrival date & time 01/28/14  5621 History   First MD Initiated Contact with Patient 01/28/14 [phone]     Chief Complaint  Patient presents with  . Fever  . Diarrhea     (Consider location/radiation/quality/duration/timing/severity/associated sxs/prior Treatment) HPI Comments: Patient is a 73 year old male past medical history significant for Mantle Cell Lymphoma (Stage IV) presenting to the emergency department for 2 days of fever (TMAX 101F 5AM today), generalized weakness and fatigue, three episodes of nonbloody diarrhea. Patient received last dose of his last cycle of chemotherapy Saturday. He is followed by Dr. Alvy Bimler.   Patient is a 73 y.o. male presenting with fever and diarrhea.  Fever Associated symptoms: chills and diarrhea   Associated symptoms: no chest pain, no nausea and no vomiting   Diarrhea Associated symptoms: chills and fever   Associated symptoms: no abdominal pain and no vomiting     Past Medical History  Diagnosis Date  . Thyroid disease   . Hyperlipemia   . Liver disease   . 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 Malnutrition 08/30/2013  . Hypothyroidism   . Depression     Spouse passed 5 months ago  . Gout 09/28/2013   Past Surgical History  Procedure Laterality Date  . Back surgery      1985 and 1986  . Axillary lymph node biopsy Right 09/12/2013    Procedure: RIGHT AXILLARY LYMPH NODE BIOPSY;  Surgeon: Marcello Moores A. Cornett, MD;  Location: [facility];  Service: General;  Laterality: Right;  . Portacath placement Right 09/12/2013    Procedure: INSERTION PORT-A-CATH;  Surgeon: Joyice Faster. Cornett, MD;  Location: [facility];  Service: General;  Laterality: Right;   Family History  Problem Relation Age of Onset  . Cancer Mother     breast ca  . Cancer Sister     NHL  . Cancer Brother     ?colon can  . Cancer Brother     lung ca   History  Substance Use Topics  . Smoking  status: Current Every Day Smoker -- 0.25 packs/day for 52 years    Types: Cigarettes  . Smokeless tobacco: Never Used  . Alcohol Use: No    Review of Systems  Constitutional: Positive for fever, chills and fatigue.  Respiratory: Negative for shortness of breath.   Cardiovascular: Negative for chest pain.  Gastrointestinal: Positive for diarrhea. Negative for nausea, vomiting and abdominal pain.  Allergic/Immunologic: Positive for immunocompromised state.  All other systems reviewed and are negative.     Allergies  Review of patient's allergies indicates no known allergies.  Home Medications   Prior to Admission medications   Medication Sig Start Date End Date Taking? Authorizing Rony Ratz  acetaminophen (TYLENOL) 500 MG tablet Take 1,000 mg by mouth every 6 (six) hours as needed for moderate pain or fever.   Yes Historical Riven Mabile, MD  acyclovir (ZOVIRAX) 400 MG tablet Take 400 mg by mouth every morning.   Yes Historical Ahilyn Nell, MD  cholecalciferol (VITAMIN D) 1000 UNITS tablet Take 2,000 Units by mouth every morning.   Yes Historical Mele Sylvester, MD  levothyroxine (SYNTHROID, LEVOTHROID) 75 MCG tablet Take 1 tablet (75 mcg total) by mouth daily. 12/27/13  Yes Heath Lark, MD  loratadine (CLARITIN) 10 MG tablet Take 10 mg by mouth every morning.   Yes Historical Jakyla Reza, MD  ondansetron (ZOFRAN) 8 MG tablet Take 8 mg by mouth every 8 (eight) hours as  needed for nausea or vomiting.   Yes Historical Patte Winkel, MD  oxyCODONE (ROXICODONE) 15 MG immediate release tablet Take 15 mg by mouth every 4 (four) hours as needed (for severe pain).   Yes Historical Lenaya Pietsch, MD  polyethylene glycol (MIRALAX / GLYCOLAX) packet Take 17 g by mouth daily as needed. 10/02/13  Yes Heath Lark, MD  predniSONE (DELTASONE) 20 MG tablet Take 20 mg by mouth daily as needed. Gout flaring. 11/01/13  Yes Historical Arrington Yohe, MD  PRESCRIPTION MEDICATION Chemotherapy regimen- Cancer center   Yes Historical Jamey Harman, MD   prochlorperazine (COMPAZINE) 10 MG tablet Take 10 mg by mouth every 6 (six) hours as needed for nausea or vomiting.   Yes Historical Marcia Lepera, MD  amoxicillin-clavulanate (AUGMENTIN) 875-125 MG per tablet Take 1 tablet by mouth every 12 (twelve) hours. 01/28/14   Jennifer L Piepenbrink, PA-C  ciprofloxacin (CIPRO) 250 MG tablet Take 1 tablet (250 mg total) by mouth every 12 (twelve) hours. 01/28/14   Jennifer L Piepenbrink, PA-C   BP 147/73  Pulse 91  Temp(Src) 100.8 F (38.2 C) (Rectal)  Resp 17  Ht 6\' 3"  (1.905 m)  Wt 200 lb (90.719 kg)  BMI 25.00 kg/m2  SpO2 98% Physical Exam  Nursing note and vitals reviewed. Constitutional: He is oriented to person, place, and time. He appears well-developed and well-nourished. No distress.  HENT:  Head: Normocephalic and atraumatic.  Right Ear: External ear normal.  Left Ear: External ear normal.  Nose: Nose normal.  Mouth/Throat: Uvula is midline and oropharynx is clear and moist. Mucous membranes are dry.  Eyes: Conjunctivae are normal.  Neck: Normal range of motion. Neck supple.  Cardiovascular: Normal rate, regular rhythm and normal heart sounds.   Pulmonary/Chest: Effort normal and breath sounds normal. No respiratory distress.  Abdominal: Soft. Bowel sounds are normal. He exhibits no distension. There is no tenderness. There is no rebound.  Musculoskeletal: Normal range of motion. He exhibits no edema.  Neurological: He is alert and oriented to person, place, and time.  Skin: Skin is warm and dry. He is not diaphoretic.  Psychiatric: He has a normal mood and affect.    ED Course  Procedures (including critical care time) Medications  acetaminophen (TYLENOL) tablet 650 mg (650 mg Oral Given 01/28/14 0846)  sodium chloride 0.9 % bolus 1,000 mL (0 mLs Intravenous Stopped 01/28/14 1229)  sodium chloride 0.9 % injection (10 mLs  Given 01/28/14 0838)  heparin lock flush 100 unit/mL (500 Units Intracatheter Given 01/28/14 1230)    Labs  Review Labs Reviewed  COMPREHENSIVE METABOLIC PANEL - Abnormal; Notable for the following:    Potassium 3.4 (*)    Glucose, Bld 118 (*)    Creatinine, Ser 1.42 (*)    Albumin 3.4 (*)    GFR calc non Af Amer 48 (*)    GFR calc Af Amer 55 (*)    All other components within normal limits  URINALYSIS, ROUTINE W REFLEX MICROSCOPIC - Abnormal; Notable for the following:    APPearance CLOUDY (*)    All other components within normal limits  CBC WITH DIFFERENTIAL - Abnormal; Notable for the following:    WBC 23.2 (*)    RBC 3.99 (*)    Hemoglobin 12.6 (*)    HCT 36.4 (*)    Neutrophils Relative % 91 (*)    Neutro Abs 21.1 (*)    Lymphocytes Relative 3 (*)    Monocytes Absolute 1.3 (*)    All other components within normal limits  I-STAT  CG4 LACTIC ACID, ED - Abnormal; Notable for the following:    Lactic Acid, Venous 2.62 (*)    All other components within normal limits  CULTURE, BLOOD (ROUTINE X 2)  CULTURE, BLOOD (ROUTINE X 2)  URINE CULTURE  LIPASE, BLOOD  CBC WITH DIFFERENTIAL  POC OCCULT BLOOD, ED    Imaging Review Dg Chest 2 View  01/28/2014   CLINICAL DATA:  Fever  EXAM: CHEST  2 VIEW  COMPARISON:  09/12/2013  FINDINGS: Stable right internal jugular vein Port-A-Cath. Low volumes. Clear lungs. No pleural effusion or pneumothorax.  IMPRESSION: No active cardiopulmonary disease.   Electronically Signed   By: Maryclare Bean M.D.   On: 01/28/2014 09:14     EKG Interpretation None      MDM   Final diagnoses:  Other specified fever    Filed Vitals:   01/28/14 1215  BP: 147/73  Pulse: 91  Temp:   Resp: 17   Patient is febrile, NAD, non-toxic appearing, AAOx4 upon arrival. Mucous membranes are dry. Physical exam is otherwise unremarkable. Abdomen is soft, nontender, nondistended. I have reviewed nursing notes, vital signs, and all appropriate lab and imaging results for this patient. IV fluids given. Patient is requesting to be discharged from the emergency department. He is  agreeable to wait until Dr. Alvy Bimler is consulted.  11:49 AM Discussed patient with Dr. Alvy Bimler. Patient is requesting to go home. He is feeling better. Labs are stable from previous outpatient visits. No evidence of pulmonary or urine source of infection. BCx2 sent. Dr. Alvy Bimler will follow [facility] and follow up with patient this week. She recommends discharging patient on Augmentin BID x 7 days and Cipro 250mg  BID x 7 days. Discussed this with patient and family who are agreeable to discharge. Patient is tolerating PO liquids w/o difficulty in ED.   Patient is stable at time of discharge. Patient d/w with Dr. Reather Converse, agrees with plan.      Harlow Mares, PA-C 01/28/14 1538

## 2014-01-28 NOTE — Discharge Instructions (Signed)
Please follow up with your primary care physician in 1-2 days. If you do not have one please call the [facility] number listed above. Please follow up with Dr. Alvy Bimler to schedule a follow up appointment.  Please take your antibiotic until completion. Please read all discharge instructions and return precautions.    Fever, Adult A fever is a higher than normal body temperature. In an adult, an oral temperature around 98.6 F (37 C) is considered normal. A temperature of 100.4 F (38 C) or higher is generally considered a fever. Mild or moderate fevers generally have no long-term effects and often do not require treatment. Extreme fever (greater than or equal to 106 F or 41.1 C) can cause seizures. The sweating that may occur with repeated or prolonged fever may cause dehydration. Elderly people can develop confusion during a fever. A measured temperature can vary with:  Age.  Time of day.  Method of measurement (mouth, underarm, rectal, or ear). The fever is confirmed by taking a temperature with a thermometer. Temperatures can be taken different ways. Some methods are accurate and some are not.  An oral temperature is used most commonly. Electronic thermometers are fast and accurate.  An ear temperature will only be accurate if the thermometer is positioned as recommended by the manufacturer.  A rectal temperature is accurate and done for those adults who have a condition where an oral temperature cannot be taken.  An underarm (axillary) temperature is not accurate and not recommended. Fever is a symptom, not a disease.  CAUSES   Infections commonly cause fever.  Some noninfectious causes for fever include:  Some arthritis conditions.  Some thyroid or adrenal gland conditions.  Some immune system conditions.  Some types of cancer.  A medicine reaction.  High doses of certain street drugs such as methamphetamine.  Dehydration.  Exposure to high  outside or room temperatures.  Occasionally, the source of a fever cannot be determined. This is sometimes called a "fever of unknown origin" (FUO).  Some situations may lead to a temporary rise in body temperature that may go away on its own. Examples are:  Childbirth.  Surgery.  Intense exercise. HOME CARE INSTRUCTIONS   Take appropriate medicines for fever. Follow dosing instructions carefully. If you use acetaminophen to reduce the fever, be careful to avoid taking other medicines that also contain acetaminophen. Do not take aspirin for a fever if you are younger than age 54. There is an association with Reye's syndrome. Reye's syndrome is a rare but potentially deadly disease.  If an infection is present and antibiotics have been prescribed, take them as directed. Finish them even if you start to feel better.  Rest as needed.  Maintain an adequate fluid intake. To prevent dehydration during an illness with prolonged or recurrent fever, you may need to drink extra fluid.Drink enough fluids to keep your urine clear or pale yellow.  Sponging or bathing with room temperature water may help reduce body temperature. Do not use ice water or alcohol sponge baths.  Dress comfortably, but do not over-bundle. SEEK MEDICAL CARE IF:   You are unable to keep fluids down.  You develop vomiting or diarrhea.  You are not feeling at least partly better after 3 days.  You develop new symptoms or problems. SEEK IMMEDIATE MEDICAL CARE IF:   You have shortness of breath or trouble breathing.  You develop excessive weakness.  You are dizzy or you faint.  You are extremely thirsty or  you are making little or no urine.  You develop new pain that was not there before (such as in the head, neck, chest, back, or abdomen).  You have persistant vomiting and diarrhea for more than 1 to 2 days.  You develop a stiff neck or your eyes become sensitive to light.  You develop a skin rash.  You  have a fever or persistent symptoms for more than 2 to 3 days.  You have a fever and your symptoms suddenly get worse. MAKE SURE YOU:   Understand these instructions.  Will watch your condition.  Will get help right away if you are not doing well or get worse. Document Released: 01/13/2001 Document Revised: 10/12/2011 Document Reviewed: 05/21/2011 [facility] Patient Information 2015 [place], [place]. This information is not intended to replace advice given to you by your health care provider. Make sure you discuss any questions you have with your health care provider.

## 2014-01-29 ENCOUNTER — Telehealth: Payer: Self-pay | Admitting: Nurse Practitioner

## 2014-01-29 ENCOUNTER — Telehealth: Payer: Self-pay | Admitting: *Deleted

## 2014-01-29 LAB — POC OCCULT BLOOD, ED: Fecal Occult Bld: NEGATIVE

## 2014-01-29 NOTE — ED Provider Notes (Signed)
Medical screening examination/treatment/procedure(s) were conducted as a shared visit with non-physician practitioner(s) or resident  and myself.  I personally evaluated the patient during the encounter and agree with the findings and plan unless otherwise indicated.    I have personally reviewed any xrays and/ or EKG's with the provider and I agree with interpretation.   Patient with history of mantle cell lymphoma followed closely by oncology presents with fevers general weakness and recurrent diarrhea nonbloody. On exam patient is very mild diffuse abdominal tenderness no guarding, mild dry mucous membranes. Plan for blood work, IV fluids reassessment and discussion with oncologist. Patient signed out to follow final disposition. Patient prefers to go home if possible. On my exam he is already feeling improved with IV fluids.  Diarrhea, vomiting, fevers  Mariea Clonts, MD 01/29/14 [phone]

## 2014-01-29 NOTE — Telephone Encounter (Signed)
Phone call made by another RN.

## 2014-01-29 NOTE — Telephone Encounter (Signed)
This RN called patient and daughter to follow up on triage call from Sunday 01/28/14 and ED visit. [name] reports the following: persistent diarrhea despite immodium; patient is drinking well but not eating much; started ABX; fever resolved. Daughter reports diarrhea x3 in ED and x5 at home, total 8 in 24 hours and patient has received 3 doses of immodium today. Pt has been eating oatmeal for breakfast and boost and gatorade to drink. Dr. Alvy Bimler notified of the above. MD recommends the following: stop ABX since cultures are negative to date; stop dairy products x1 month; take immodium 2 tablets 4 times daily scheduled; and call back on Wednesday for update. Patient's daughter voices understanding of the instructions but notes it will be hard to keep pt from eating/drinking dairy but she will try. Patient instructed to call Wednesday for update but if problems persist and patient does not improve to call clinic with urgent needs. She verbalizes understanding.

## 2014-01-30 LAB — URINE CULTURE
CULTURE: NO GROWTH
Colony Count: NO GROWTH

## 2014-01-31 ENCOUNTER — Telehealth: Payer: Self-pay | Admitting: *Deleted

## 2014-01-31 NOTE — Telephone Encounter (Signed)
Daughter left VM reporting pt's diarrhea is improving and "almost gone."  No fever in 2 days.  Pt is drinking plenty of fluids but only eating very small meals.  He continues to feel fatigued, but overall doing better.

## 2014-02-03 LAB — CULTURE, BLOOD (ROUTINE X 2)
Culture: NO GROWTH
Culture: NO GROWTH

## 2014-02-13 ENCOUNTER — Telehealth: Payer: Self-pay | Admitting: *Deleted

## 2014-02-13 NOTE — Telephone Encounter (Signed)
Pt left VM states Dr. Alvy Bimler talked to him about taking a pill instead of getting stem cell transplant?  He asks what the name of the pill is and how many he would have to take?

## 2014-02-13 NOTE — Telephone Encounter (Signed)
Informed pt of Ibrutinib if his cancer returns.  Spelled name of drug for pt and he says he is going to look it up. He is still trying to decide whether or not to have transplant.  He goes back to see Transplant MD at [facility] at end of this month.

## 2014-02-13 NOTE — Telephone Encounter (Signed)
The pill is for relapse, not maintenance right now. It's Ibrutinib

## 2014-03-02 ENCOUNTER — Telehealth: Payer: Self-pay | Admitting: *Deleted

## 2014-03-02 NOTE — Telephone Encounter (Signed)
Daughter left VM states pt had PAC flushed at [facility] yesterday and to cancel his appt for Flush here next week.   She also reports pt has decided to move forward with Transplant.   Daughter expressed  a lot of gratitude for Dr. Calton Dach care of her father.   Flush appt canceled.

## 2014-03-22 ENCOUNTER — Other Ambulatory Visit: Payer: Self-pay | Admitting: *Deleted

## 2014-03-22 DIAGNOSIS — E039 Hypothyroidism, unspecified: Secondary | ICD-10-CM

## 2014-03-22 MED ORDER — LEVOTHYROXINE SODIUM 75 MCG PO TABS: 75.0000 ug | ORAL_TABLET | Freq: Every day | ORAL | Status: DC

## 2014-03-22 MED ORDER — ACYCLOVIR 400 MG PO TABS: 400.0000 mg | ORAL_TABLET | Freq: Every morning | ORAL | Status: DC

## 2014-03-22 NOTE — Telephone Encounter (Signed)
Prescriptions faxed to [facility] [phone]

## 2014-03-26 ENCOUNTER — Other Ambulatory Visit: Payer: Self-pay | Admitting: Hematology and Oncology

## 2014-03-26 ENCOUNTER — Telehealth: Payer: Self-pay | Admitting: *Deleted

## 2014-03-26 ENCOUNTER — Telehealth: Payer: Self-pay | Admitting: Medical Oncology

## 2014-03-26 NOTE — Telephone Encounter (Signed)
Wells Guiles from [facility] office @ [facility] called informing office that pt will need to have neupogen injections for this Friday, Saturday and Sunday. Informed Wells Guiles that our clinic is not open Sundays but we are able to give injection for Friday and Saturday. Wells Guiles states will fax orders to our office.   Orders received and given to MD.  Per MD, will place orders.

## 2014-03-26 NOTE — Telephone Encounter (Signed)
Per staff message from doctor to schedule injection appts. Appts made and patient plus daughter called gioven appts

## 2014-03-29 ENCOUNTER — Telehealth: Payer: Self-pay | Admitting: *Deleted

## 2014-03-29 NOTE — Telephone Encounter (Signed)
Note opened in error.

## 2014-03-29 NOTE — Telephone Encounter (Signed)
Per Jackelyn Hoehn in managed care dept.Marland Kitchen Neupogen not covered by pt's insurance at our facility. It is approved at Transplant facility only as it is related to his transplant.  Called [facility] and s/w Rogina.  Informed her of this and we have to cancel injection appts for pt here tomorrow and Saturday.  She will notify MD and get pt r/s there.

## 2014-03-30 ENCOUNTER — Ambulatory Visit: Payer: Medicare PPO

## 2014-03-31 ENCOUNTER — Ambulatory Visit: Payer: Medicare PPO

## 2014-04-30 ENCOUNTER — Encounter: Payer: Self-pay | Admitting: Hematology and Oncology

## 2014-04-30 ENCOUNTER — Ambulatory Visit (HOSPITAL_BASED_OUTPATIENT_CLINIC_OR_DEPARTMENT_OTHER): Payer: Medicare PPO

## 2014-04-30 ENCOUNTER — Other Ambulatory Visit: Payer: Self-pay | Admitting: Hematology and Oncology

## 2014-04-30 ENCOUNTER — Other Ambulatory Visit (HOSPITAL_BASED_OUTPATIENT_CLINIC_OR_DEPARTMENT_OTHER): Payer: Medicare PPO

## 2014-04-30 ENCOUNTER — Ambulatory Visit (HOSPITAL_BASED_OUTPATIENT_CLINIC_OR_DEPARTMENT_OTHER): Payer: Medicare PPO | Admitting: Hematology and Oncology

## 2014-04-30 ENCOUNTER — Telehealth: Payer: Self-pay | Admitting: Hematology and Oncology

## 2014-04-30 VITALS — BP 112/48 | HR 74 | Temp 98.6°F | Resp 18 | Ht 75.0 in | Wt 196.7 lb

## 2014-04-30 DIAGNOSIS — R197 Diarrhea, unspecified: Secondary | ICD-10-CM | POA: Insufficient documentation

## 2014-04-30 DIAGNOSIS — R112 Nausea with vomiting, unspecified: Secondary | ICD-10-CM

## 2014-04-30 DIAGNOSIS — D630 Anemia in neoplastic disease: Secondary | ICD-10-CM

## 2014-04-30 DIAGNOSIS — T451X5A Adverse effect of antineoplastic and immunosuppressive drugs, initial encounter: Secondary | ICD-10-CM

## 2014-04-30 DIAGNOSIS — C8310 Mantle cell lymphoma, unspecified site: Secondary | ICD-10-CM

## 2014-04-30 DIAGNOSIS — C8319 Mantle cell lymphoma, extranodal and solid organ sites: Secondary | ICD-10-CM

## 2014-04-30 DIAGNOSIS — M1A49X0 Other secondary chronic gout, multiple sites, without tophus (tophi): Secondary | ICD-10-CM

## 2014-04-30 DIAGNOSIS — Z9481 Bone marrow transplant status: Secondary | ICD-10-CM | POA: Insufficient documentation

## 2014-04-30 DIAGNOSIS — E876 Hypokalemia: Secondary | ICD-10-CM

## 2014-04-30 DIAGNOSIS — D696 Thrombocytopenia, unspecified: Secondary | ICD-10-CM | POA: Insufficient documentation

## 2014-04-30 DIAGNOSIS — D6481 Anemia due to antineoplastic chemotherapy: Secondary | ICD-10-CM | POA: Insufficient documentation

## 2014-04-30 LAB — COMPREHENSIVE METABOLIC PANEL (CC13)
ALT: 16 U/L (ref 0–55)
AST: 11 U/L (ref 5–34)
Albumin: 2.8 g/dL — ABNORMAL LOW (ref 3.5–5.0)
Alkaline Phosphatase: 80 U/L (ref 40–150)
Anion Gap: 8 mEq/L (ref 3–11)
BILIRUBIN TOTAL: 0.52 mg/dL (ref 0.20–1.20)
BUN: 13.5 mg/dL (ref 7.0–26.0)
CALCIUM: 8.7 mg/dL (ref 8.4–10.4)
CO2: 28 mEq/L (ref 22–29)
CREATININE: 1 mg/dL (ref 0.7–1.3)
Chloride: 105 mEq/L (ref 98–109)
Glucose: 96 mg/dl (ref 70–140)
Potassium: 3.4 mEq/L — ABNORMAL LOW (ref 3.5–5.1)
Sodium: 141 mEq/L (ref 136–145)
Total Protein: 5.6 g/dL — ABNORMAL LOW (ref 6.4–8.3)

## 2014-04-30 LAB — CBC WITH DIFFERENTIAL/PLATELET
BASO%: 0.2 % (ref 0.0–2.0)
Basophils Absolute: 0 10*3/uL (ref 0.0–0.1)
EOS%: 0 % (ref 0.0–7.0)
Eosinophils Absolute: 0 10*3/uL (ref 0.0–0.5)
HEMATOCRIT: 29.2 % — AB (ref 38.4–49.9)
HGB: 10.2 g/dL — ABNORMAL LOW (ref 13.0–17.1)
LYMPH%: 10.2 % — AB (ref 14.0–49.0)
MCH: 30.2 pg (ref 27.2–33.4)
MCHC: 34.9 g/dL (ref 32.0–36.0)
MCV: 86.4 fL (ref 79.3–98.0)
MONO#: 0.5 10*3/uL (ref 0.1–0.9)
MONO%: 11.5 % (ref 0.0–14.0)
NEUT#: 3.7 10*3/uL (ref 1.5–6.5)
NEUT%: 78.1 % — AB (ref 39.0–75.0)
PLATELETS: 47 10*3/uL — AB (ref 140–400)
RBC: 3.38 10*6/uL — ABNORMAL LOW (ref 4.20–5.82)
RDW: 14.2 % (ref 11.0–14.6)
WBC: 4.7 10*3/uL (ref 4.0–10.3)
lymph#: 0.5 10*3/uL — ABNORMAL LOW (ref 0.9–3.3)
nRBC: 0 % (ref 0–0)

## 2014-04-30 LAB — TECHNOLOGIST REVIEW

## 2014-04-30 LAB — LACTATE DEHYDROGENASE (CC13): LDH: 196 U/L (ref 125–245)

## 2014-04-30 MED ORDER — SODIUM CHLORIDE 0.9 % IV SOLN
Freq: Once | INTRAVENOUS | Status: AC
  Administered 2014-04-30: 16:00:00 via INTRAVENOUS
  Filled 2014-04-30: qty 1000

## 2014-04-30 MED ORDER — HEPARIN SOD (PORK) LOCK FLUSH 100 UNIT/ML IV SOLN
500.0000 [IU] | Freq: Once | INTRAVENOUS | Status: AC | PRN
  Administered 2014-04-30: 500 [IU]
  Filled 2014-04-30: qty 5

## 2014-04-30 MED ORDER — HEPARIN SOD (PORK) LOCK FLUSH 100 UNIT/ML IV SOLN
250.0000 [IU] | Freq: Once | INTRAVENOUS | Status: DC | PRN
  Filled 2014-04-30: qty 5

## 2014-04-30 MED ORDER — SODIUM CHLORIDE 0.9 % IJ SOLN
10.0000 mL | INTRAMUSCULAR | Status: DC | PRN
  Administered 2014-04-30: 10 mL
  Filled 2014-04-30: qty 10

## 2014-04-30 NOTE — Assessment & Plan Note (Signed)
This is likely due to recent treatment. The patient denies recent history of bleeding such as epistaxis, hematuria or hematochezia. He is asymptomatic from the low platelet count. I will observe for now.  he does not require transfusion now. 

## 2014-04-30 NOTE — Assessment & Plan Note (Signed)
He is on oral replacement therapy. I would also replace through IV fluids today.

## 2014-04-30 NOTE — Assessment & Plan Note (Signed)
He has no recent recurrence of gout.

## 2014-04-30 NOTE — Assessment & Plan Note (Signed)
The cause is unknown. From what I could gather he does not look toxic and I do not think we need to worry about C. Diff for now. Recommend increase oral fluid intake as tolerated.

## 2014-04-30 NOTE — Telephone Encounter (Signed)
gv pt appt schedule for oct.  °

## 2014-04-30 NOTE — Assessment & Plan Note (Signed)
This is likely anemia of chronic disease. The patient denies recent history of bleeding such as epistaxis, hematuria or hematochezia. He is asymptomatic from the anemia. We will observe for now.  He does not require transfusion now.   

## 2014-04-30 NOTE — Progress Notes (Signed)
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 In addition, there is moderate splenomegaly with associated hypermetabolic activity consistent with lymphomatous involvement   09/21/2013 -  Chemotherapy The patient will start on cycle 1 of bendamustine and rituximab   01/24/2014 Imaging Repeat PET/CT scan showed near complete response to treatment.    INTERVAL HISTORY: Please see below for problem oriented charting. The patient is seen after recent bone marrow transplant. He feels very weak. He denies fevers or chills. He has watery diarrhea times one today. His intake is very poor due to nausea.  REVIEW OF SYSTEMS:   Constitutional: Denies fevers, chills or abnormal weight loss Eyes: Denies blurriness of vision Ears, nose, mouth, throat, and face: Denies mucositis or sore throat Respiratory: Denies cough, dyspnea or wheezes Cardiovascular: Denies palpitation, chest discomfort or lower extremity swelling Skin: Denies abnormal skin rashes Lymphatics: Denies new lymphadenopathy or easy bruising Neurological:Denies numbness, tingling Behavioral/Psych: Mood is stable, no new changes   All other systems were reviewed with the patient and are negative.  I have reviewed the past medical history, past surgical history, social history and family history with the patient and they are unchanged from previous note.  ALLERGIES:  has No Known Allergies.  MEDICATIONS:  Current Outpatient Prescriptions  Medication Sig Dispense Refill  . acetaminophen (TYLENOL) 500 MG tablet Take 1,000 mg by mouth every 6 (six) hours as needed for moderate pain or fever.      Marland Kitchen acyclovir (ZOVIRAX) 800 MG tablet Take 800 mg by mouth.      . cholecalciferol (VITAMIN D) 1000 UNITS tablet Take 2,000 Units by mouth every morning.      . folic acid (FOLVITE) 1 MG tablet Take 1 mg by mouth.      . levothyroxine (SYNTHROID, LEVOTHROID) 75 MCG tablet Take 1 tablet (75 mcg total) by mouth daily.  30 tablet  6  . Multiple Vitamin tablet Take 1 tablet by mouth.      . ondansetron (ZOFRAN) 8 MG tablet Take 8 mg by mouth every 8 (eight) hours as needed for nausea or vomiting.      Marland Kitchen oxyCODONE (ROXICODONE) 15 MG immediate release tablet Take 15 mg by mouth every 4 (four) hours as needed (for severe pain).      . potassium chloride (MICRO-K) 10 MEQ CR capsule Take 20 mEq by mouth.      . prochlorperazine (COMPAZINE) 10 MG tablet Take 10 mg by mouth every 6 (six) hours as needed for nausea or vomiting.      Marland Kitchen loratadine (CLARITIN) 10 MG tablet Take 10 mg by mouth every morning.      . polyethylene glycol (MIRALAX / GLYCOLAX) packet Take 17  g by mouth daily as needed.      . predniSONE (DELTASONE) 20 MG tablet Take 20 mg by mouth daily as needed. Gout flaring.      Marland Kitchen PRESCRIPTION MEDICATION Chemotherapy regimen- Cancer center      . [START ON 05/16/2014] sulfamethoxazole-trimethoprim (BACTRIM DS) 800-160 MG per tablet Take 1 tablet by mouth.       No current facility-administered medications for this visit.   Facility-Administered Medications Ordered in Other Visits  Medication Dose Route Frequency Provider Last  Rate Last Dose  . heparin lock flush 100 unit/mL  250 Units Intracatheter Once PRN Heath Lark, MD      . sodium chloride 0.9 % injection 10 mL  10 mL Intracatheter PRN Suvan Stcyr, MD   10 mL at 04/30/14 1800    PHYSICAL EXAMINATION: ECOG PERFORMANCE STATUS: 2 - Symptomatic, <50% confined to bed  Filed Vitals:   04/30/14 1452  BP: 112/48  Pulse: 74  Temp: 98.6 F (37 C)  Resp: 18   Filed Weights   04/30/14 1452  Weight: 196 lb 11.2 oz (89.223 kg)    GENERAL:alert, no distress and comfortable. He is ill-appearing, sitting on the wheelchair SKIN: skin color is pale, texture, turgor are normal, no rashes or significant lesions EYES: normal, Conjunctiva are pale and non-injected, sclera clear OROPHARYNX:no exudate, no erythema and lips, buccal mucosa, and tongue normal  NECK: supple, thyroid normal size, non-tender, without nodularity LYMPH:  no palpable lymphadenopathy in the cervical, axillary or inguinal LUNGS: clear to auscultation and percussion with normal breathing effort HEART: regular rate & rhythm and no murmurs and no lower extremity edema ABDOMEN:abdomen soft, non-tender and normal bowel sounds Musculoskeletal:no cyanosis of digits and no clubbing  NEURO: alert & oriented x 3 with fluent speech, no focal motor/sensory deficits  LABORATORY DATA:  I have reviewed the data as listed    Component Value Date/Time   NA 141 04/30/2014 1442   NA 138 01/28/2014 0655   K 3.4* 04/30/2014 1442   K 3.4* 01/28/2014 0655   CL 100 01/28/2014 0655   CO2 28 04/30/2014 1442   CO2 21 01/28/2014 0655   GLUCOSE 96 04/30/2014 1442   GLUCOSE 118* 01/28/2014 0655   BUN 13.5 04/30/2014 1442   BUN 19 01/28/2014 0655   CREATININE 1.0 04/30/2014 1442   CREATININE 1.42* 01/28/2014 0655   CALCIUM 8.7 04/30/2014 1442   CALCIUM 9.4 01/28/2014 0655   PROT 5.6* 04/30/2014 1442   PROT 6.9 01/28/2014 0655   ALBUMIN 2.8* 04/30/2014 1442   ALBUMIN 3.4* 01/28/2014 0655   AST 11 04/30/2014 1442   AST 12 01/28/2014  0655   ALT 16 04/30/2014 1442   ALT 10 01/28/2014 0655   ALKPHOS 80 04/30/2014 1442   ALKPHOS 77 01/28/2014 0655   BILITOT 0.52 04/30/2014 1442   BILITOT 0.6 01/28/2014 0655   GFRNONAA 48* 01/28/2014 0655   GFRAA 55* 01/28/2014 0655    No results found for this basename: SPEP, UPEP,  kappa and lambda light chains    Lab Results  Component Value Date   WBC 4.7 04/30/2014   NEUTROABS 3.7 04/30/2014   HGB 10.2* 04/30/2014   HCT 29.2* 04/30/2014   MCV 86.4 04/30/2014   PLT 47* 04/30/2014      Chemistry      Component Value Date/Time   NA 141 04/30/2014 1442   NA 138 01/28/2014 0655   K 3.4* 04/30/2014 1442   K 3.4* 01/28/2014 0655   CL 100 01/28/2014  0655   CO2 28 04/30/2014 1442   CO2 21 01/28/2014 0655   BUN 13.5 04/30/2014 1442   BUN 19 01/28/2014 0655   CREATININE 1.0 04/30/2014 1442   CREATININE 1.42* 01/28/2014 0655      Component Value Date/Time   CALCIUM 8.7 04/30/2014 1442   CALCIUM 9.4 01/28/2014 0655   ALKPHOS 80 04/30/2014 1442   ALKPHOS 77 01/28/2014 0655   AST 11 04/30/2014 1442   AST 12 01/28/2014 0655   ALT 16 04/30/2014 1442   ALT 10 01/28/2014 0655   BILITOT 0.52 04/30/2014 1442   BILITOT 0.6 01/28/2014 0655      ASSESSMENT & PLAN:  Mantle cell lymphoma The patient is seen today for supportive care visit after recent autologous stem cell transplant. His full records not available yet. From what I can gather, the patient developed severe nausea, dehydration, neutropenic fever and was just dismissed home as last weekend. He continues to feel very weak. I will continue supportive care and will order labs as requested from his transplant service at [place]. Per instruction from [facility], I have ordered PET CT scan to restage the lymphoma at the end of the year.  Gout He has no recent recurrence of gout.  Anemia in neoplastic disease This is likely anemia of chronic disease. The patient denies recent history of bleeding such as epistaxis, hematuria or hematochezia. He  is asymptomatic from the anemia. We will observe for now.  He does not require transfusion now.    Thrombocytopenia, unspecified This is likely due to recent treatment. The patient denies recent history of bleeding such as epistaxis, hematuria or hematochezia. He is asymptomatic from the low platelet count. I will observe for now.  he does not require transfusion now.    Nausea with vomiting I recommended intravenous fluids and intravenous antiemetics.  Hypokalemia due to inadequate potassium intake He is on oral replacement therapy. I would also replace through IV fluids today.  Diarrhea The cause is unknown. From what I could gather he does not look toxic and I do not think we need to worry about C. Diff for now. Recommend increase oral fluid intake as tolerated.  S/P autologous bone marrow transplantation Continue aggressive supportive care.   Orders Placed This Encounter  Procedures  . NM PET Image Restag (PS) Skull Base To Thigh    Standing Status: Future     Number of Occurrences:      Standing Expiration Date: 06/30/2015    Order Specific Question:  Reason for Exam (SYMPTOM  OR DIAGNOSIS REQUIRED)    Answer:  staging lymphoma s/p BMT    Order Specific Question:  Preferred imaging location?    Answer:  [facility]  . Comprehensive metabolic panel    Standing Status: Standing     Number of Occurrences: 9     Standing Expiration Date: 05/01/2015  . CBC with Differential    Standing Status: Standing     Number of Occurrences: 9     Standing Expiration Date: 05/01/2015  . Magnesium    Standing Status: Standing     Number of Occurrences: 9     Standing Expiration Date: 05/01/2015  . Hold Tube, Blood Bank    Standing Status: Standing     Number of Occurrences: 9     Standing Expiration Date: 05/01/2015   All questions were answered. The patient knows to call the clinic with any problems, questions or concerns. No barriers to learning was detected. I spent  40  minutes counseling the patient face to face. The total time spent in the appointment was 60 minutes and more than 50% was on counseling and review of test results     [facility], [place], MD 04/30/2014 8:43 PM

## 2014-04-30 NOTE — Assessment & Plan Note (Signed)
I recommended intravenous fluids and intravenous antiemetics.

## 2014-04-30 NOTE — Patient Instructions (Signed)
Dehydration, Adult Dehydration is when you lose more fluids from the body than you take in. Vital organs like the kidneys, brain, and heart cannot function without a proper amount of fluids and salt. Any loss of fluids from the body can cause dehydration.  CAUSES   Vomiting.  Diarrhea.  Excessive sweating.  Excessive urine output.  Fever. SYMPTOMS  Mild dehydration  Thirst.  Dry lips.  Slightly dry mouth. Moderate dehydration  Very dry mouth.  Sunken eyes.  Skin does not bounce back quickly when lightly pinched and released.  Dark urine and decreased urine production.  Decreased tear production.  Headache. Severe dehydration  Very dry mouth.  Extreme thirst.  Rapid, weak pulse (more than 100 beats per minute at rest).  Cold hands and feet.  Not able to sweat in spite of heat and temperature.  Rapid breathing.  Blue lips.  Confusion and lethargy.  Difficulty being awakened.  Minimal urine production.  No tears. DIAGNOSIS  Your caregiver will diagnose dehydration based on your symptoms and your exam. Blood and urine tests will help confirm the diagnosis. The diagnostic evaluation should also identify the cause of dehydration. TREATMENT  Treatment of mild or moderate dehydration can often be done at home by increasing the amount of fluids that you drink. It is best to drink small amounts of fluid more often. Drinking too much at one time can make vomiting worse. Refer to the home care instructions below. Severe dehydration needs to be treated at the hospital where you will probably be given intravenous (IV) fluids that contain water and electrolytes. HOME CARE INSTRUCTIONS   Ask your caregiver about specific rehydration instructions.  Drink enough fluids to keep your urine clear or pale yellow.  Drink small amounts frequently if you have nausea and vomiting.  Eat as you normally do.  Avoid:  Foods or drinks high in sugar.  Carbonated  drinks.  Juice.  Extremely hot or cold fluids.  Drinks with caffeine.  Fatty, greasy foods.  Alcohol.  Tobacco.  Overeating.  Gelatin desserts.  Wash your hands well to avoid spreading bacteria and viruses.  Only take over-the-counter or prescription medicines for pain, discomfort, or fever as directed by your caregiver.  Ask your caregiver if you should continue all prescribed and over-the-counter medicines.  Keep all follow-up appointments with your caregiver. SEEK MEDICAL CARE IF:  You have abdominal pain and it increases or stays in one area (localizes).  You have a rash, stiff neck, or severe headache.  You are irritable, sleepy, or difficult to awaken.  You are weak, dizzy, or extremely thirsty. SEEK IMMEDIATE MEDICAL CARE IF:   You are unable to keep fluids down or you get worse despite treatment.  You have frequent episodes of vomiting or diarrhea.  You have blood or green matter (bile) in your vomit.  You have blood in your stool or your stool looks black and tarry.  You have not urinated in 6 to 8 hours, or you have only urinated a small amount of very dark urine.  You have a fever.  You faint. MAKE SURE YOU:   Understand these instructions.  Will watch your condition.  Will get help right away if you are not doing well or get worse. Document Released: 07/20/2005 Document Revised: 10/12/2011 Document Reviewed: 03/09/2011 ExitCare Patient Information 2015 [employer]. This information is not intended to replace advice given to you by your health care provider. Make sure you discuss any questions you have with your health care   provider.  

## 2014-04-30 NOTE — Assessment & Plan Note (Signed)
Continue aggressive supportive care.

## 2014-04-30 NOTE — Assessment & Plan Note (Addendum)
The patient is seen today for supportive care visit after recent autologous stem cell transplant. His full records not available yet. From what I can gather, the patient developed severe nausea, dehydration, neutropenic fever and was just dismissed home as last weekend. He continues to feel very weak. I will continue supportive care and will order labs as requested from his transplant service at [place]. Per instruction from [facility], I have ordered PET CT scan to restage the lymphoma at the end of the year.

## 2014-05-01 ENCOUNTER — Telehealth: Payer: Self-pay

## 2014-05-01 NOTE — Telephone Encounter (Signed)
Labs from 04/30/14 faxed to [facility] [phone].

## 2014-05-01 NOTE — Progress Notes (Signed)
9/29, Postassium IVF from 9/28 should be completed at 1800hr as saline flush was done at 1800hr. Coding notified. HL

## 2014-05-03 ENCOUNTER — Other Ambulatory Visit (HOSPITAL_BASED_OUTPATIENT_CLINIC_OR_DEPARTMENT_OTHER): Payer: Medicare PPO

## 2014-05-03 ENCOUNTER — Ambulatory Visit (HOSPITAL_BASED_OUTPATIENT_CLINIC_OR_DEPARTMENT_OTHER): Payer: Medicare PPO | Admitting: Hematology and Oncology

## 2014-05-03 ENCOUNTER — Ambulatory Visit: Payer: Medicare PPO

## 2014-05-03 ENCOUNTER — Telehealth: Payer: Self-pay | Admitting: Hematology and Oncology

## 2014-05-03 VITALS — BP 101/49 | HR 73 | Temp 98.3°F | Resp 18 | Ht 75.0 in | Wt 199.9 lb

## 2014-05-03 DIAGNOSIS — D630 Anemia in neoplastic disease: Secondary | ICD-10-CM

## 2014-05-03 DIAGNOSIS — R531 Weakness: Secondary | ICD-10-CM

## 2014-05-03 DIAGNOSIS — C8310 Mantle cell lymphoma, unspecified site: Secondary | ICD-10-CM

## 2014-05-03 DIAGNOSIS — C8314 Mantle cell lymphoma, lymph nodes of axilla and upper limb: Secondary | ICD-10-CM

## 2014-05-03 DIAGNOSIS — D696 Thrombocytopenia, unspecified: Secondary | ICD-10-CM

## 2014-05-03 LAB — COMPREHENSIVE METABOLIC PANEL (CC13)
ALBUMIN: 2.6 g/dL — AB (ref 3.5–5.0)
ALK PHOS: 64 U/L (ref 40–150)
ALT: 19 U/L (ref 0–55)
ANION GAP: 6 meq/L (ref 3–11)
AST: 16 U/L (ref 5–34)
BUN: 9.6 mg/dL (ref 7.0–26.0)
CALCIUM: 8.4 mg/dL (ref 8.4–10.4)
CO2: 28 meq/L (ref 22–29)
Chloride: 104 mEq/L (ref 98–109)
Creatinine: 1.2 mg/dL (ref 0.7–1.3)
Glucose: 116 mg/dl (ref 70–140)
Potassium: 4 mEq/L (ref 3.5–5.1)
Sodium: 138 mEq/L (ref 136–145)
Total Bilirubin: 0.42 mg/dL (ref 0.20–1.20)
Total Protein: 5.3 g/dL — ABNORMAL LOW (ref 6.4–8.3)

## 2014-05-03 LAB — CBC WITH DIFFERENTIAL/PLATELET
BASO%: 0.4 % (ref 0.0–2.0)
Basophils Absolute: 0 10*3/uL (ref 0.0–0.1)
EOS%: 0.4 % (ref 0.0–7.0)
Eosinophils Absolute: 0 10*3/uL (ref 0.0–0.5)
HCT: 27.4 % — ABNORMAL LOW (ref 38.4–49.9)
HGB: 9.4 g/dL — ABNORMAL LOW (ref 13.0–17.1)
LYMPH%: 17.9 % (ref 14.0–49.0)
MCH: 30.5 pg (ref 27.2–33.4)
MCHC: 34.3 g/dL (ref 32.0–36.0)
MCV: 89 fL (ref 79.3–98.0)
MONO#: 0.4 10*3/uL (ref 0.1–0.9)
MONO%: 12.5 % (ref 0.0–14.0)
NEUT%: 68.8 % (ref 39.0–75.0)
NEUTROS ABS: 1.9 10*3/uL (ref 1.5–6.5)
PLATELETS: 73 10*3/uL — AB (ref 140–400)
RBC: 3.08 10*6/uL — AB (ref 4.20–5.82)
RDW: 14.4 % (ref 11.0–14.6)
WBC: 2.8 10*3/uL — AB (ref 4.0–10.3)
lymph#: 0.5 10*3/uL — ABNORMAL LOW (ref 0.9–3.3)

## 2014-05-03 LAB — MAGNESIUM (CC13): Magnesium: 1.9 mg/dl (ref 1.5–2.5)

## 2014-05-03 LAB — TECHNOLOGIST REVIEW

## 2014-05-03 LAB — HOLD TUBE, BLOOD BANK

## 2014-05-03 NOTE — Assessment & Plan Note (Signed)
This is likely due to recent treatment. The patient denies recent history of bleeding such as epistaxis, hematuria or hematochezia. He is asymptomatic from the low platelet count. I will observe for now.  he does not require transfusion now. This is improving.

## 2014-05-03 NOTE — Progress Notes (Signed)
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 In addition, there is moderate splenomegaly with associated hypermetabolic activity consistent with lymphomatous involvement   09/21/2013 -  Chemotherapy The patient will start on cycle 1 of bendamustine and rituximab   01/24/2014 Imaging Repeat PET/CT scan showed near complete response to treatment.    INTERVAL HISTORY: Please see below for problem oriented charting. He is seen for further followup. Over the last 3 days, he slightly improved. He is taking schedule antiemetics round-the-clock. He has no further diarrhea or nausea.  REVIEW OF SYSTEMS:   Constitutional: Denies fevers, chills or abnormal weight loss Eyes: Denies blurriness of vision Ears, nose, mouth, throat, and face: Denies mucositis or sore throat Respiratory: Denies cough, dyspnea or wheezes Cardiovascular: Denies palpitation, chest discomfort or lower extremity swelling Skin: Denies abnormal skin rashes Lymphatics: Denies new lymphadenopathy or easy bruising Neurological:Denies numbness, tingling or new weaknesses Behavioral/Psych: Mood is stable, no new changes   All other systems were reviewed with the patient and are negative.  I have reviewed the past medical history, past surgical history, social history and family history with the patient and they are unchanged from previous note.  ALLERGIES:  has No Known Allergies.  MEDICATIONS:  Current Outpatient Prescriptions  Medication Sig Dispense Refill  . acetaminophen (TYLENOL) 500 MG tablet Take 1,000 mg by mouth every 6 (six) hours as needed for moderate pain or fever.      Marland Kitchen acyclovir (ZOVIRAX) 800 MG tablet Take 800 mg by mouth.      . cholecalciferol (VITAMIN D) 1000 UNITS tablet Take 2,000 Units by mouth every morning.      . folic acid (FOLVITE) 1 MG tablet Take 1 mg by mouth.      . levothyroxine (SYNTHROID, LEVOTHROID) 75 MCG tablet Take 1 tablet (75 mcg total) by mouth daily.  30 tablet  6  . loratadine (CLARITIN) 10 MG tablet Take 10 mg by mouth every morning.      . Multiple Vitamin tablet Take 1 tablet by mouth.      . ondansetron (ZOFRAN) 8 MG tablet Take 8 mg by mouth every 8 (eight) hours as needed for nausea or vomiting.      Marland Kitchen oxyCODONE (ROXICODONE) 15 MG immediate release tablet Take 15 mg by mouth every 4 (four) hours as needed (for severe pain).      . polyethylene glycol (MIRALAX / GLYCOLAX) packet Take 17 g by mouth daily as needed.      . potassium chloride (MICRO-K) 10 MEQ CR capsule Take 20 mEq by mouth.      . predniSONE (DELTASONE) 20 MG tablet Take 20 mg by mouth daily as needed. Gout flaring.      Marland Kitchen  PRESCRIPTION MEDICATION Chemotherapy regimen- Cancer center      . prochlorperazine (COMPAZINE) 10 MG tablet Take 10 mg by mouth every 6 (six) hours as needed for nausea or vomiting.      Derrill Memo ON 05/16/2014] sulfamethoxazole-trimethoprim (BACTRIM DS) 800-160 MG per tablet Take 1 tablet by mouth.       No current facility-administered medications for this visit.    PHYSICAL EXAMINATION: ECOG PERFORMANCE STATUS: 1 - Symptomatic but completely ambulatory  Filed  Vitals:   05/03/14 1115  BP: 101/49  Pulse: 73  Temp: 98.3 F (36.8 C)  Resp: 18   Filed Weights   05/03/14 1115  Weight: 199 lb 14.4 oz (90.674 kg)    GENERAL:alert, no distress and comfortable SKIN: skin color, texture, turgor are normal, no rashes or significant lesions ABDOMEN:abdomen soft, non-tender and normal bowel sounds. Active bowel sounds are heard Musculoskeletal:no cyanosis of digits and no clubbing  NEURO: alert & oriented x 3 with fluent speech, no focal motor/sensory deficits  LABORATORY DATA:  I have reviewed the data as listed    Component Value Date/Time   NA 138 05/03/2014 1046   NA 138 01/28/2014 0655   K 4.0 05/03/2014 1046   K 3.4* 01/28/2014 0655   CL 100 01/28/2014 0655   CO2 28 05/03/2014 1046   CO2 21 01/28/2014 0655   GLUCOSE 116 05/03/2014 1046   GLUCOSE 118* 01/28/2014 0655   BUN 9.6 05/03/2014 1046   BUN 19 01/28/2014 0655   CREATININE 1.2 05/03/2014 1046   CREATININE 1.42* 01/28/2014 0655   CALCIUM 8.4 05/03/2014 1046   CALCIUM 9.4 01/28/2014 0655   PROT 5.3* 05/03/2014 1046   PROT 6.9 01/28/2014 0655   ALBUMIN 2.6* 05/03/2014 1046   ALBUMIN 3.4* 01/28/2014 0655   AST 16 05/03/2014 1046   AST 12 01/28/2014 0655   ALT 19 05/03/2014 1046   ALT 10 01/28/2014 0655   ALKPHOS 64 05/03/2014 1046   ALKPHOS 77 01/28/2014 0655   BILITOT 0.42 05/03/2014 1046   BILITOT 0.6 01/28/2014 0655   GFRNONAA 48* 01/28/2014 0655   GFRAA 55* 01/28/2014 0655    No results found for this basename: SPEP, UPEP,  kappa and lambda light chains    Lab Results  Component Value Date   WBC 2.8* 05/03/2014   NEUTROABS 1.9 05/03/2014   HGB 9.4* 05/03/2014   HCT 27.4* 05/03/2014   MCV 89.0 05/03/2014   PLT 73* 05/03/2014      Chemistry      Component Value Date/Time   NA 138 05/03/2014 1046   NA 138 01/28/2014 0655   K 4.0 05/03/2014 1046   K 3.4* 01/28/2014 0655   CL 100 01/28/2014 0655   CO2 28 05/03/2014 1046   CO2 21 01/28/2014 0655   BUN 9.6 05/03/2014 1046   BUN 19 01/28/2014 0655    CREATININE 1.2 05/03/2014 1046   CREATININE 1.42* 01/28/2014 0655      Component Value Date/Time   CALCIUM 8.4 05/03/2014 1046   CALCIUM 9.4 01/28/2014 0655   ALKPHOS 64 05/03/2014 1046   ALKPHOS 77 01/28/2014 0655   AST 16 05/03/2014 1046   AST 12 01/28/2014 0655   ALT 19 05/03/2014 1046   ALT 10 01/28/2014 0655   BILITOT 0.42 05/03/2014 1046   BILITOT 0.6 01/28/2014 0655     ASSESSMENT & PLAN:  Mantle cell lymphoma The patient is seen today for supportive care visit after recent autologous stem cell transplant. His full records not available yet.  He continues to feel very weak. However, his nausea has resolved. I will continue supportive care and will order labs as requested from his transplant service at [place]. Per instruction from [facility], I have ordered PET CT scan to restage the lymphoma at the end of the year.    Anemia in neoplastic disease This is likely anemia of chronic disease. The patient denies recent history of bleeding such as epistaxis, hematuria or hematochezia. He is asymptomatic from the anemia. We will observe for now.  He does not require transfusion now.   Thrombocytopenia This is likely due to recent treatment. The patient denies recent history of bleeding such as epistaxis, hematuria or hematochezia. He is asymptomatic from the low platelet count. I will observe for now.  he does not require transfusion now. This is improving.   No orders of the defined types were placed in this encounter.   All questions were answered. The patient knows to call the clinic with any problems, questions or concerns. No barriers to learning was detected. I spent 15 minutes counseling the patient face to face. The total time spent in the appointment was 20 minutes and more than 50% was on counseling and review of test results     [facility], [place], MD 05/03/2014 11:33 AM

## 2014-05-03 NOTE — Assessment & Plan Note (Signed)
This is likely anemia of chronic disease. The patient denies recent history of bleeding such as epistaxis, hematuria or hematochezia. He is asymptomatic from the anemia. We will observe for now.  He does not require transfusion now.   

## 2014-05-03 NOTE — Telephone Encounter (Signed)
gv adn printed appt sched and avs for pt for OCT °

## 2014-05-03 NOTE — Assessment & Plan Note (Signed)
The patient is seen today for supportive care visit after recent autologous stem cell transplant. His full records not available yet. He continues to feel very weak. However, his nausea has resolved. I will continue supportive care and will order labs as requested from his transplant service at [place]. Per instruction from [facility], I have ordered PET CT scan to restage the lymphoma at the end of the year.

## 2014-05-07 ENCOUNTER — Other Ambulatory Visit (HOSPITAL_BASED_OUTPATIENT_CLINIC_OR_DEPARTMENT_OTHER): Payer: Medicare PPO

## 2014-05-07 ENCOUNTER — Telehealth: Payer: Self-pay | Admitting: *Deleted

## 2014-05-07 DIAGNOSIS — C8310 Mantle cell lymphoma, unspecified site: Secondary | ICD-10-CM

## 2014-05-07 DIAGNOSIS — C8314 Mantle cell lymphoma, lymph nodes of axilla and upper limb: Secondary | ICD-10-CM

## 2014-05-07 LAB — COMPREHENSIVE METABOLIC PANEL (CC13)
ALBUMIN: 3 g/dL — AB (ref 3.5–5.0)
ALT: 43 U/L (ref 0–55)
AST: 25 U/L (ref 5–34)
Alkaline Phosphatase: 86 U/L (ref 40–150)
Anion Gap: 9 mEq/L (ref 3–11)
BUN: 10.6 mg/dL (ref 7.0–26.0)
CALCIUM: 9.2 mg/dL (ref 8.4–10.4)
CHLORIDE: 108 meq/L (ref 98–109)
CO2: 25 meq/L (ref 22–29)
Creatinine: 1.1 mg/dL (ref 0.7–1.3)
Glucose: 92 mg/dl (ref 70–140)
POTASSIUM: 4.8 meq/L (ref 3.5–5.1)
Sodium: 142 mEq/L (ref 136–145)
TOTAL PROTEIN: 6.4 g/dL (ref 6.4–8.3)
Total Bilirubin: 0.38 mg/dL (ref 0.20–1.20)

## 2014-05-07 LAB — CBC WITH DIFFERENTIAL/PLATELET
BASO%: 1.1 % (ref 0.0–2.0)
BASOS ABS: 0.1 10*3/uL (ref 0.0–0.1)
EOS%: 1.1 % (ref 0.0–7.0)
Eosinophils Absolute: 0.1 10*3/uL (ref 0.0–0.5)
HCT: 28.8 % — ABNORMAL LOW (ref 38.4–49.9)
HGB: 9.6 g/dL — ABNORMAL LOW (ref 13.0–17.1)
LYMPH%: 38 % (ref 14.0–49.0)
MCH: 30 pg (ref 27.2–33.4)
MCHC: 33.2 g/dL (ref 32.0–36.0)
MCV: 90.3 fL (ref 79.3–98.0)
MONO#: 0.5 10*3/uL (ref 0.1–0.9)
MONO%: 9.2 % (ref 0.0–14.0)
NEUT#: 2.9 10*3/uL (ref 1.5–6.5)
NEUT%: 50.6 % (ref 39.0–75.0)
Platelets: 191 10*3/uL (ref 140–400)
RBC: 3.19 10*6/uL — AB (ref 4.20–5.82)
RDW: 14.5 % (ref 11.0–14.6)
WBC: 5.7 10*3/uL (ref 4.0–10.3)
lymph#: 2.2 10*3/uL (ref 0.9–3.3)

## 2014-05-07 LAB — HOLD TUBE, BLOOD BANK

## 2014-05-07 LAB — MAGNESIUM (CC13): Magnesium: 2.2 mg/dl (ref 1.5–2.5)

## 2014-05-07 NOTE — Telephone Encounter (Signed)
Message copied by Cathlean Cower on Mon May 07, 2014 12:54 PM ------      Message from: [facility], [place]: Mon May 07, 2014 12:43 PM      Regarding: cbc       Pls let hhim know cbc is getting better      ----- Message -----         From: [facility]         Sent: 05/07/2014  12:17 PM           To: Heath Lark, MD                   ------

## 2014-05-07 NOTE — Telephone Encounter (Signed)
Informed daughter of labs improving and faxed them to [facility].   She verbalized understanding.

## 2014-05-14 ENCOUNTER — Other Ambulatory Visit: Payer: Self-pay | Admitting: *Deleted

## 2014-05-15 ENCOUNTER — Telehealth: Payer: Self-pay | Admitting: Hematology and Oncology

## 2014-05-15 ENCOUNTER — Other Ambulatory Visit: Payer: Self-pay | Admitting: *Deleted

## 2014-05-15 NOTE — Telephone Encounter (Signed)
Gave apt d/t for 06/07/14.

## 2014-05-23 ENCOUNTER — Telehealth: Payer: Self-pay | Admitting: *Deleted

## 2014-05-23 NOTE — Telephone Encounter (Signed)
Daughter reports pt c/o "sniffles" and a swollen, tender, gland on his neck.  He denies any fevers.  He is in [place], [place] right now.  Instructed for pt to see his PCP, Dr. Harvel Ricks,  in [facility] for these symptoms.  Daughter says pt does not want to see his PCP but he also does not want to come back to [facility] to see Dr. Alvy Bimler before his next scheduled appt on 11/05.    Instructed her it is up to pt if he goes to see his PCP or not, but that we recommend he see his PCP.  He is going to have his lab work done by PCP office later this week.  Maybe PCP will see him then.  Daughter states she will try to convince pt to call Dr. Harvel Ricks.

## 2014-06-07 ENCOUNTER — Encounter: Payer: Self-pay | Admitting: Hematology and Oncology

## 2014-06-07 ENCOUNTER — Ambulatory Visit (HOSPITAL_BASED_OUTPATIENT_CLINIC_OR_DEPARTMENT_OTHER): Payer: Medicare PPO | Admitting: Hematology and Oncology

## 2014-06-07 ENCOUNTER — Ambulatory Visit (HOSPITAL_BASED_OUTPATIENT_CLINIC_OR_DEPARTMENT_OTHER): Payer: Medicare PPO

## 2014-06-07 ENCOUNTER — Other Ambulatory Visit (HOSPITAL_BASED_OUTPATIENT_CLINIC_OR_DEPARTMENT_OTHER): Payer: Medicare PPO

## 2014-06-07 VITALS — BP 135/56 | HR 77 | Temp 98.4°F | Resp 18 | Ht 75.0 in | Wt 200.4 lb

## 2014-06-07 DIAGNOSIS — Z95828 Presence of other vascular implants and grafts: Secondary | ICD-10-CM

## 2014-06-07 DIAGNOSIS — C8310 Mantle cell lymphoma, unspecified site: Secondary | ICD-10-CM

## 2014-06-07 DIAGNOSIS — D630 Anemia in neoplastic disease: Secondary | ICD-10-CM

## 2014-06-07 DIAGNOSIS — Z452 Encounter for adjustment and management of vascular access device: Secondary | ICD-10-CM

## 2014-06-07 LAB — CBC WITH DIFFERENTIAL/PLATELET
BASO%: 1.3 % (ref 0.0–2.0)
BASOS ABS: 0.1 10*3/uL (ref 0.0–0.1)
EOS%: 11 % — ABNORMAL HIGH (ref 0.0–7.0)
Eosinophils Absolute: 0.8 10*3/uL — ABNORMAL HIGH (ref 0.0–0.5)
HEMATOCRIT: 30.1 % — AB (ref 38.4–49.9)
HEMOGLOBIN: 10.1 g/dL — AB (ref 13.0–17.1)
LYMPH%: 15.6 % (ref 14.0–49.0)
MCH: 31.2 pg (ref 27.2–33.4)
MCHC: 33.4 g/dL (ref 32.0–36.0)
MCV: 93.4 fL (ref 79.3–98.0)
MONO#: 1 10*3/uL — AB (ref 0.1–0.9)
MONO%: 13.7 % (ref 0.0–14.0)
NEUT#: 4.4 10*3/uL (ref 1.5–6.5)
NEUT%: 58.4 % (ref 39.0–75.0)
Platelets: 200 10*3/uL (ref 140–400)
RBC: 3.23 10*6/uL — ABNORMAL LOW (ref 4.20–5.82)
RDW: 19.1 % — ABNORMAL HIGH (ref 11.0–14.6)
WBC: 7.5 10*3/uL (ref 4.0–10.3)
lymph#: 1.2 10*3/uL (ref 0.9–3.3)

## 2014-06-07 LAB — COMPREHENSIVE METABOLIC PANEL (CC13)
ALT: 15 U/L (ref 0–55)
ANION GAP: 8 meq/L (ref 3–11)
AST: 17 U/L (ref 5–34)
Albumin: 3.6 g/dL (ref 3.5–5.0)
Alkaline Phosphatase: 66 U/L (ref 40–150)
BUN: 15.9 mg/dL (ref 7.0–26.0)
CO2: 25 meq/L (ref 22–29)
Calcium: 9.4 mg/dL (ref 8.4–10.4)
Chloride: 108 mEq/L (ref 98–109)
Creatinine: 1.3 mg/dL (ref 0.7–1.3)
Glucose: 95 mg/dl (ref 70–140)
Potassium: 4.7 mEq/L (ref 3.5–5.1)
Sodium: 141 mEq/L (ref 136–145)
Total Bilirubin: 0.38 mg/dL (ref 0.20–1.20)
Total Protein: 6.3 g/dL — ABNORMAL LOW (ref 6.4–8.3)

## 2014-06-07 LAB — HOLD TUBE, BLOOD BANK

## 2014-06-07 LAB — MAGNESIUM (CC13): Magnesium: 2.2 mg/dl (ref 1.5–2.5)

## 2014-06-07 MED ORDER — HEPARIN SOD (PORK) LOCK FLUSH 100 UNIT/ML IV SOLN
500.0000 [IU] | Freq: Once | INTRAVENOUS | Status: AC
  Administered 2014-06-07: 500 [IU] via INTRAVENOUS
  Filled 2014-06-07: qty 5

## 2014-06-07 MED ORDER — SODIUM CHLORIDE 0.9 % IJ SOLN
10.0000 mL | INTRAMUSCULAR | Status: DC | PRN
  Administered 2014-06-07: 10 mL via INTRAVENOUS
  Filled 2014-06-07: qty 10

## 2014-06-07 NOTE — Progress Notes (Signed)
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 In addition, there is moderate splenomegaly with associated hypermetabolic activity consistent with lymphomatous involvement   09/21/2013 - 01/26/2014 Chemotherapy The patient received 6 cycles of bendamustine and rituximab   01/24/2014 Imaging Repeat PET/CT scan showed near complete response to treatment.   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 He had recent upper respiratory infection, resolved with antibiotics. Complained of recent lymph node swelling in his neck, resolved. He complained of altered taste sensation. He is contemplating not pursuing recommendation for maintenance Rituximab  REVIEW OF SYSTEMS:   Constitutional: Denies fevers, chills or abnormal weight loss Eyes: Denies blurriness of vision Ears, nose, mouth, throat, and face: Denies mucositis or sore throat Respiratory: Denies cough, dyspnea or wheezes Cardiovascular: Denies palpitation, chest discomfort or lower extremity  swelling Gastrointestinal:  Denies nausea, heartburn or change in bowel habits Skin: Denies abnormal skin rashes Lymphatics: Denies new lymphadenopathy or easy bruising Neurological:Denies numbness, tingling or new weaknesses Behavioral/Psych: Mood is stable, no new changes  All other systems were reviewed with the patient and are negative.  I have reviewed the past medical history, past surgical history, social history and family history with the patient and they are unchanged from previous note.  ALLERGIES:  has No Known Allergies.  MEDICATIONS:  Current Outpatient Prescriptions  Medication Sig Dispense Refill  . acetaminophen (TYLENOL) 500 MG tablet Take 1,000 mg by mouth every 6 (six) hours as needed for moderate pain or fever.    Marland Kitchen acyclovir (ZOVIRAX) 800 MG tablet Take 800 mg by mouth.    . cholecalciferol (VITAMIN D) 1000 UNITS tablet Take 2,000 Units by mouth every morning.    . folic acid (FOLVITE) 1 MG tablet Take 1 mg by mouth.    . levothyroxine (SYNTHROID, LEVOTHROID) 75 MCG tablet Take 1 tablet (75 mcg total) by mouth daily. 30 tablet 6  . loratadine (CLARITIN) 10 MG tablet Take 10 mg by mouth every morning.    . Multiple Vitamin tablet Take 1 tablet by mouth.    . sulfamethoxazole-trimethoprim (BACTRIM DS) 800-160 MG per tablet Take 1 tablet by mouth.    . polyethylene glycol (MIRALAX / GLYCOLAX) packet Take 17 g by mouth daily as needed.     No current facility-administered medications for this visit.    PHYSICAL EXAMINATION: ECOG PERFORMANCE STATUS: 1 - Symptomatic but completely ambulatory  Filed Vitals:   06/07/14 1222  BP: 135/56  Pulse: 77  Temp: 98.4 F (36.9 C)  Resp: 18   Filed Weights   06/07/14 1222  Weight: 200 lb 6.4 oz (90.901  kg)    GENERAL:alert, no distress and comfortable SKIN: skin color, texture, turgor are normal, no rashes or significant lesions EYES: normal, Conjunctiva are pink and non-injected, sclera clear OROPHARYNX:no exudate,  no erythema and lips, buccal mucosa, and tongue normal  NECK: supple, thyroid normal size, non-tender, without nodularity LYMPH:  no palpable lymphadenopathy in the cervical, axillary or inguinal LUNGS: clear to auscultation and percussion with normal breathing effort HEART: regular rate & rhythm and no murmurs and no lower extremity edema ABDOMEN:abdomen soft, non-tender and normal bowel sounds Musculoskeletal:no cyanosis of digits and no clubbing  NEURO: alert & oriented x 3 with fluent speech, no focal motor/sensory deficits  LABORATORY DATA:  I have reviewed the data as listed    Component Value Date/Time   NA 141 06/07/2014 1141   NA 138 01/28/2014 0655   K 4.7 06/07/2014 1141   K 3.4* 01/28/2014 0655   CL 100 01/28/2014 0655   CO2 25 06/07/2014 1141   CO2 21 01/28/2014 0655   GLUCOSE 95 06/07/2014 1141   GLUCOSE 118* 01/28/2014 0655   BUN 15.9 06/07/2014 1141   BUN 19 01/28/2014 0655   CREATININE 1.3 06/07/2014 1141   CREATININE 1.42* 01/28/2014 0655   CALCIUM 9.4 06/07/2014 1141   CALCIUM 9.4 01/28/2014 0655   PROT 6.3* 06/07/2014 1141   PROT 6.9 01/28/2014 0655   ALBUMIN 3.6 06/07/2014 1141   ALBUMIN 3.4* 01/28/2014 0655   AST 17 06/07/2014 1141   AST 12 01/28/2014 0655   ALT 15 06/07/2014 1141   ALT 10 01/28/2014 0655   ALKPHOS 66 06/07/2014 1141   ALKPHOS 77 01/28/2014 0655   BILITOT 0.38 06/07/2014 1141   BILITOT 0.6 01/28/2014 0655   GFRNONAA 48* 01/28/2014 0655   GFRAA 55* 01/28/2014 0655    No results found for: SPEP, UPEP  Lab Results  Component Value Date   WBC 7.5 06/07/2014   NEUTROABS 4.4 06/07/2014   HGB 10.1* 06/07/2014   HCT 30.1* 06/07/2014   MCV 93.4 06/07/2014   PLT 200 06/07/2014      Chemistry      Component Value Date/Time   NA 141 06/07/2014 1141   NA 138 01/28/2014 0655   K 4.7 06/07/2014 1141   K 3.4* 01/28/2014 0655   CL 100 01/28/2014 0655   CO2 25 06/07/2014 1141   CO2 21 01/28/2014 0655   BUN 15.9 06/07/2014 1141    BUN 19 01/28/2014 0655   CREATININE 1.3 06/07/2014 1141   CREATININE 1.42* 01/28/2014 0655      Component Value Date/Time   CALCIUM 9.4 06/07/2014 1141   CALCIUM 9.4 01/28/2014 0655   ALKPHOS 66 06/07/2014 1141   ALKPHOS 77 01/28/2014 0655   AST 17 06/07/2014 1141   AST 12 01/28/2014 0655   ALT 15 06/07/2014 1141   ALT 10 01/28/2014 0655   BILITOT 0.38 06/07/2014 1141   BILITOT 0.6 01/28/2014 0655      ASSESSMENT & PLAN:  Mantle cell lymphoma Per instruction from [facility], I have ordered PET CT scan to restage the lymphoma at the end of the year. Clinically he has achieved complete remission. I do not have strong preference whether the patient should go for maintenance Rituximab. The patient wants to think about it. If he decides not to undergo maintenance therapy, I will remove the port. He will call me after his evaluation at [facility] in December      Anemia in neoplastic disease This is likely anemia of chronic disease.  The patient denies recent history of bleeding such as epistaxis, hematuria or hematochezia. He is asymptomatic from the anemia. We will observe for now.  He does not require transfusion now.      No orders of the defined types were placed in this encounter.   All questions were answered. The patient knows to call the clinic with any problems, questions or concerns. No barriers to learning was detected. I spent 25 minutes counseling the patient face to face. The total time spent in the appointment was 30 minutes and more than 50% was on counseling and review of test results     [facility], [place], MD 06/07/2014 8:18 PM

## 2014-06-07 NOTE — Patient Instructions (Signed)

## 2014-06-07 NOTE — Progress Notes (Signed)
Patient in for Port-A-Cath flush and labs today. Patient's port accessed and flushed without any difficulty. Patient denies any pain or discomfort during or after flush. No blood return noted with flush. All labs drawn by Phlebotomist today.

## 2014-06-07 NOTE — Assessment & Plan Note (Signed)
This is likely anemia of chronic disease. The patient denies recent history of bleeding such as epistaxis, hematuria or hematochezia. He is asymptomatic from the anemia. We will observe for now.  He does not require transfusion now.   

## 2014-06-07 NOTE — Assessment & Plan Note (Signed)
Per instruction from [facility], I have ordered PET CT scan to restage the lymphoma at the end of the year. Clinically he has achieved complete remission. I do not have strong preference whether the patient should go for maintenance Rituximab. The patient wants to think about it. If he decides not to undergo maintenance therapy, I will remove the port. He will call me after his evaluation at [facility] in December

## 2014-06-12 ENCOUNTER — Telehealth: Payer: Self-pay | Admitting: *Deleted

## 2014-06-12 NOTE — Telephone Encounter (Signed)
Daughter states need to change date of PET scan due to appt w/ Dr. Hardin Negus at [facility].  Gave her the number to Radiology Scheduler to change the date.

## 2014-06-14 ENCOUNTER — Other Ambulatory Visit: Payer: Self-pay | Admitting: Hematology and Oncology

## 2014-06-22 ENCOUNTER — Encounter: Payer: Self-pay | Admitting: Hematology and Oncology

## 2014-07-09 ENCOUNTER — Ambulatory Visit (HOSPITAL_COMMUNITY): Admission: RE | Admit: 2014-07-09 | Payer: Medicare PPO | Source: Ambulatory Visit

## 2014-07-13 ENCOUNTER — Encounter (HOSPITAL_COMMUNITY): Payer: Self-pay

## 2014-07-13 ENCOUNTER — Telehealth: Payer: Self-pay | Admitting: Medical Oncology

## 2014-07-13 ENCOUNTER — Ambulatory Visit (HOSPITAL_COMMUNITY)
Admission: RE | Admit: 2014-07-13 | Discharge: 2014-07-13 | Disposition: A | Discharge status: Home / Self Care | Payer: Medicare PPO | Source: Ambulatory Visit | Attending: Hematology and Oncology | Admitting: Hematology and Oncology

## 2014-07-13 DIAGNOSIS — C8310 Mantle cell lymphoma, unspecified site: Secondary | ICD-10-CM

## 2014-07-13 DIAGNOSIS — Z9481 Bone marrow transplant status: Secondary | ICD-10-CM | POA: Diagnosis not present

## 2014-07-13 LAB — GLUCOSE, CAPILLARY: Glucose-Capillary: 96 mg/dL (ref 70–99)

## 2014-07-13 MED ORDER — FLUDEOXYGLUCOSE F - 18 (FDG) INJECTION
11.8000 | Freq: Once | INTRAVENOUS | Status: AC | PRN
  Administered 2014-07-13: 11.8 via INTRAVENOUS

## 2014-07-13 NOTE — Telephone Encounter (Signed)
I called pt to check on him . I called his home/mobile number and left a voice mail to call me.

## 2014-07-16 ENCOUNTER — Telehealth: Payer: Self-pay | Admitting: *Deleted

## 2014-07-16 NOTE — Telephone Encounter (Signed)
Becky from [facility] states pt being seen there today and they would like copy of PET report faxed to them.  Faxed over PET scan report from 12/11 to them at fax [phone].

## 2014-07-20 ENCOUNTER — Telehealth: Payer: Self-pay | Admitting: *Deleted

## 2014-07-20 DIAGNOSIS — Z9481 Bone marrow transplant status: Secondary | ICD-10-CM

## 2014-07-20 DIAGNOSIS — C8310 Mantle cell lymphoma, unspecified site: Secondary | ICD-10-CM

## 2014-07-20 NOTE — Telephone Encounter (Signed)
Called to check on pt.  He is currently at home in [place], [place].  He reports ongoing nausea and abd discomfort along with having a "black tongue" for several weeks.   He says Dr. Hardin Negus told him he needs to see GI for endoscopy but he has not made any appts to do so yet.  Offered to make pt a referral to see [facility] since his Daughter lives in [place] and it might be more convenient, especially if he does require any procedures.   Pt agreed and would appreciate referral to [facility].  Referral order placed.   Informed pt we can arrange to flush his PAC same day he has to come to town to see GI.  He verbalized understanding.

## 2014-07-23 ENCOUNTER — Telehealth: Payer: Self-pay | Admitting: Hematology and Oncology

## 2014-07-23 ENCOUNTER — Telehealth: Payer: Self-pay | Admitting: *Deleted

## 2014-07-23 NOTE — Telephone Encounter (Signed)
1/5, 2 to 230 pm, use new pt appt, no labs

## 2014-07-23 NOTE — Telephone Encounter (Signed)
S/w pt's daughter at length.   First of all she says pt does Not need a GI Referral.  She says he is feeling much better since he stopped his Bactrim.  Informed daughter I s/w pt a few days ago and he was still c/o nausea and especially concerned about his "black tongue."   She insists that pt is better and does not need to keep appt w/ GI as scheduled.    She says pt does want to see Dr. Alvy Bimler again to discuss treatment with Rituxan.   Now that he is feeling a little better,  He is more open to the idea of getting more treatment if needed.   She had a lot of questions about how often pt should f/u w/ Dr. Hardin Negus and how often he will need to see Dr. Alvy Bimler.  Explained it will depend on if he decides to proceed with the Rituxan or not.  Informed her I will cancel the Appt w/ GI MD and have scheduler call her back for appt w/ Dr. Alvy Bimler.   She verbalized understanding.

## 2014-07-23 NOTE — Telephone Encounter (Signed)
s.w. pt and advosed on pt appt for 12.23 @ 9:45 at [facility]....with Flush after

## 2014-07-23 NOTE — Telephone Encounter (Signed)
called [phone] per pof...no vm set up.....mailed pt appt sched and letter

## 2014-07-25 ENCOUNTER — Ambulatory Visit: Payer: Medicare PPO | Admitting: Physician Assistant

## 2014-08-07 ENCOUNTER — Encounter: Payer: Self-pay | Admitting: Hematology and Oncology

## 2014-08-07 ENCOUNTER — Ambulatory Visit (HOSPITAL_BASED_OUTPATIENT_CLINIC_OR_DEPARTMENT_OTHER): Payer: Medicare PPO

## 2014-08-07 ENCOUNTER — Ambulatory Visit (HOSPITAL_BASED_OUTPATIENT_CLINIC_OR_DEPARTMENT_OTHER): Payer: Medicare PPO | Admitting: Hematology and Oncology

## 2014-08-07 ENCOUNTER — Telehealth: Payer: Self-pay | Admitting: Hematology and Oncology

## 2014-08-07 VITALS — BP 146/64 | HR 93 | Temp 98.3°F | Resp 19 | Ht 75.0 in | Wt 201.1 lb

## 2014-08-07 DIAGNOSIS — R112 Nausea with vomiting, unspecified: Secondary | ICD-10-CM

## 2014-08-07 DIAGNOSIS — D630 Anemia in neoplastic disease: Secondary | ICD-10-CM

## 2014-08-07 DIAGNOSIS — D6959 Other secondary thrombocytopenia: Secondary | ICD-10-CM

## 2014-08-07 DIAGNOSIS — D696 Thrombocytopenia, unspecified: Secondary | ICD-10-CM

## 2014-08-07 DIAGNOSIS — R1115 Cyclical vomiting syndrome unrelated to migraine: Secondary | ICD-10-CM

## 2014-08-07 DIAGNOSIS — C8310 Mantle cell lymphoma, unspecified site: Secondary | ICD-10-CM

## 2014-08-07 LAB — COMPREHENSIVE METABOLIC PANEL (CC13)
ALK PHOS: 95 U/L (ref 40–150)
ALT: 16 U/L (ref 0–55)
AST: 21 U/L (ref 5–34)
Albumin: 3.8 g/dL (ref 3.5–5.0)
Anion Gap: 9 mEq/L (ref 3–11)
BUN: 17.2 mg/dL (ref 7.0–26.0)
CALCIUM: 8.9 mg/dL (ref 8.4–10.4)
CO2: 24 mEq/L (ref 22–29)
Chloride: 108 mEq/L (ref 98–109)
Creatinine: 1.4 mg/dL — ABNORMAL HIGH (ref 0.7–1.3)
EGFR: 52 mL/min/{1.73_m2} — ABNORMAL LOW (ref >90)
Glucose: 98 mg/dl (ref 70–140)
Potassium: 3.6 mEq/L (ref 3.5–5.1)
Sodium: 141 mEq/L (ref 136–145)
Total Bilirubin: 0.63 mg/dL (ref 0.20–1.20)
Total Protein: 6.6 g/dL (ref 6.4–8.3)

## 2014-08-07 LAB — CBC WITH DIFFERENTIAL/PLATELET
BASO%: 0.8 % (ref 0.0–2.0)
Basophils Absolute: 0.1 10*3/uL (ref 0.0–0.1)
EOS%: 3.4 % (ref 0.0–7.0)
Eosinophils Absolute: 0.3 10*3/uL (ref 0.0–0.5)
HEMATOCRIT: 26.9 % — AB (ref 38.4–49.9)
HGB: 9.3 g/dL — ABNORMAL LOW (ref 13.0–17.1)
LYMPH%: 49.9 % — AB (ref 14.0–49.0)
MCH: 30.8 pg (ref 27.2–33.4)
MCHC: 34.6 g/dL (ref 32.0–36.0)
MCV: 89.1 fL (ref 79.3–98.0)
MONO#: 1.1 10*3/uL — AB (ref 0.1–0.9)
MONO%: 13.7 % (ref 0.0–14.0)
NEUT#: 2.5 10*3/uL (ref 1.5–6.5)
NEUT%: 32.2 % — ABNORMAL LOW (ref 39.0–75.0)
NRBC: 0 % (ref 0–0)
PLATELETS: 55 10*3/uL — AB (ref 140–400)
RBC: 3.02 10*6/uL — ABNORMAL LOW (ref 4.20–5.82)
RDW: 13.6 % (ref 11.0–14.6)
WBC: 7.7 10*3/uL (ref 4.0–10.3)
lymph#: 3.8 10*3/uL — ABNORMAL HIGH (ref 0.9–3.3)

## 2014-08-07 LAB — TECHNOLOGIST REVIEW

## 2014-08-07 LAB — URIC ACID (CC13): URIC ACID, SERUM: 10.6 mg/dL — AB (ref 2.6–7.4)

## 2014-08-07 LAB — LACTATE DEHYDROGENASE (CC13): LDH: 273 U/L — ABNORMAL HIGH (ref 125–245)

## 2014-08-07 NOTE — Telephone Encounter (Signed)
gv and printed app sched and avs for pt for Jan....sent pt to lab and gv pt barium

## 2014-08-07 NOTE — Assessment & Plan Note (Signed)
This is likely anemia of chronic disease. The patient denies recent history of bleeding such as epistaxis, hematuria or hematochezia. He is asymptomatic from the anemia. We will observe for now.  He does not require transfusion now.   

## 2014-08-07 NOTE — Assessment & Plan Note (Signed)
I am very concerned about possible disease recurrence. He had palpable splenomegaly. I recommend CT scan of the neck, chest, abdomen and pelvis for restaging. I will see him back next week to review test results.

## 2014-08-07 NOTE — Assessment & Plan Note (Signed)
This, along with splenomegaly is highly suggestive of possible disease recurrence. I will order staging scans as above

## 2014-08-07 NOTE — Progress Notes (Signed)
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 In addition, there is moderate splenomegaly with associated hypermetabolic activity consistent with lymphomatous involvement   09/21/2013 - 01/26/2014 Chemotherapy The patient received 6 cycles of bendamustine and rituximab   01/24/2014 Imaging Repeat PET/CT scan showed near complete response to treatment.   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 He returns for further follow-up. He complained of new palpable lump in the left upper quadrant of his abdomen. He continued to have sensation of gastritis/indigestion. He also has some nausea. He has one episode of vomiting 10 days ago. He felt that his neck is swollen.  REVIEW OF SYSTEMS:   Constitutional: Denies fevers, chills or abnormal weight loss Eyes: Denies blurriness of vision Ears, nose, mouth, throat, and face: Denies mucositis or sore throat Respiratory: Denies cough, dyspnea or  wheezes Cardiovascular: Denies palpitation, chest discomfort or lower extremity swelling Skin: Denies abnormal skin rashes Lymphatics: Denies new lymphadenopathy or easy bruising Neurological:Denies numbness, tingling or new weaknesses Behavioral/Psych: Mood is stable, no new changes  All other systems were reviewed with the patient and are negative.  I have reviewed the past medical history, past surgical history, social history and family history with the patient and they are unchanged from previous note.  ALLERGIES:  has No Known Allergies.  MEDICATIONS:  Current Outpatient Prescriptions  Medication Sig Dispense Refill  . acetaminophen (TYLENOL) 500 MG tablet Take 1,000 mg by mouth every 6 (six) hours as needed for moderate pain or fever.    Marland Kitchen acyclovir (ZOVIRAX) 800 MG tablet Take 800 mg by mouth 2 (two) times daily.     . cholecalciferol (VITAMIN D) 1000 UNITS tablet Take 2,000 Units by mouth every morning.    . folic acid (FOLVITE) 1 MG tablet Take 1 mg by mouth.    . levothyroxine (SYNTHROID, LEVOTHROID) 75 MCG tablet Take 1 tablet (75 mcg total) by mouth daily. 30 tablet 6  . loratadine (CLARITIN) 10 MG tablet Take 10 mg by mouth every morning.    . Multiple Vitamin tablet Take 1 tablet by mouth.    . nystatin (MYCOSTATIN) 100000 UNIT/ML suspension Take 5 mLs by mouth 4 (four) times daily. Swish and spit    . polyethylene glycol (MIRALAX / GLYCOLAX) packet Take 17 g by mouth daily as needed.    . sulfamethoxazole-trimethoprim (BACTRIM DS) 800-160 MG per tablet Take 1 tablet by mouth.     No current facility-administered medications for this visit.    PHYSICAL EXAMINATION: ECOG PERFORMANCE STATUS: 1 -  Symptomatic but completely ambulatory  Filed Vitals:   08/07/14 1419  BP: 146/64  Pulse: 93  Temp: 98.3 F (36.8 C)  Resp: 19   Filed Weights   08/07/14 1419  Weight: 201 lb 1.6 oz (91.218 kg)    GENERAL:alert, no distress and comfortable SKIN: skin color, texture,  turgor are normal, no rashes or significant lesions EYES: normal, Conjunctiva are pink and non-injected, sclera clear OROPHARYNX:no exudate, no erythema and lips, buccal mucosa, and tongue normal  NECK: supple, thyroid normal size, non-tender, without nodularity LYMPH:  no palpable lymphadenopathy in the cervical, axillary or inguinal LUNGS: clear to auscultation and percussion with normal breathing effort HEART: regular rate & rhythm and no murmurs and no lower extremity edema ABDOMEN:abdomen soft, non-tender and normal bowel sounds. Palpable splenomegaly Musculoskeletal:no cyanosis of digits and no clubbing  NEURO: alert & oriented x 3 with fluent speech, no focal motor/sensory deficits  LABORATORY DATA:  I have reviewed the data as listed    Component Value Date/Time   NA 141 08/07/2014 1536   NA 138 01/28/2014 0655   K 3.6 08/07/2014 1536   K 3.4* 01/28/2014 0655   CL 100 01/28/2014 0655   CO2 24 08/07/2014 1536   CO2 21 01/28/2014 0655   GLUCOSE 98 08/07/2014 1536   GLUCOSE 118* 01/28/2014 0655   BUN 17.2 08/07/2014 1536   BUN 19 01/28/2014 0655   CREATININE 1.4* 08/07/2014 1536   CREATININE 1.42* 01/28/2014 0655   CALCIUM 8.9 08/07/2014 1536   CALCIUM 9.4 01/28/2014 0655   PROT 6.6 08/07/2014 1536   PROT 6.9 01/28/2014 0655   ALBUMIN 3.8 08/07/2014 1536   ALBUMIN 3.4* 01/28/2014 0655   AST 21 08/07/2014 1536   AST 12 01/28/2014 0655   ALT 16 08/07/2014 1536   ALT 10 01/28/2014 0655   ALKPHOS 95 08/07/2014 1536   ALKPHOS 77 01/28/2014 0655   BILITOT 0.63 08/07/2014 1536   BILITOT 0.6 01/28/2014 0655   GFRNONAA 48* 01/28/2014 0655   GFRAA 55* 01/28/2014 0655    No results found for: SPEP, UPEP  Lab Results  Component Value Date   WBC 7.7 08/07/2014   NEUTROABS 2.5 08/07/2014   HGB 9.3* 08/07/2014   HCT 26.9* 08/07/2014   MCV 89.1 08/07/2014   PLT 55* 08/07/2014      Chemistry      Component Value Date/Time   NA 141 08/07/2014 1536   NA 138 01/28/2014  0655   K 3.6 08/07/2014 1536   K 3.4* 01/28/2014 0655   CL 100 01/28/2014 0655   CO2 24 08/07/2014 1536   CO2 21 01/28/2014 0655   BUN 17.2 08/07/2014 1536   BUN 19 01/28/2014 0655   CREATININE 1.4* 08/07/2014 1536   CREATININE 1.42* 01/28/2014 0655      Component Value Date/Time   CALCIUM 8.9 08/07/2014 1536   CALCIUM 9.4 01/28/2014 0655   ALKPHOS 95 08/07/2014 1536   ALKPHOS 77 01/28/2014 0655   AST 21 08/07/2014 1536   AST 12 01/28/2014 0655   ALT 16 08/07/2014 1536   ALT 10 01/28/2014 0655   BILITOT 0.63 08/07/2014 1536   BILITOT 0.6 01/28/2014 0655     I reviewed his recent PET CT scan from December we show that he is in complete remission ASSESSMENT & PLAN:  Mantle cell lymphoma  I am very concerned about possible disease recurrence. He had palpable splenomegaly. I recommend CT scan of the neck, chest, abdomen and pelvis for restaging. I will see him  back next week to review test results.  Nausea with vomiting  His symptoms are highly suggestive of gastritis/indigestion. I recommend over-the-counter Prilosec, Zantac and Tum's. If it does not improve, he would need GI referral. The patient have not try anti-emetics yet.  Anemia in neoplastic disease This is likely anemia of chronic disease. The patient denies recent history of bleeding such as epistaxis, hematuria or hematochezia. He is asymptomatic from the anemia. We will observe for now.  He does not require transfusion now.    Thrombocytopenia  This, along with splenomegaly is highly suggestive of possible disease recurrence. I will order staging scans as above   Orders Placed This Encounter  Procedures  . CT Soft Tissue Neck W Contrast    Standing Status: Future     Number of Occurrences:      Standing Expiration Date: 11/07/2015    Order Specific Question:  Reason for Exam (SYMPTOM  OR DIAGNOSIS REQUIRED)    Answer:  staging lymphoma, new onset lymphadenopathy X 2 weeks, mantle cell lymphoma    Order  Specific Question:  Preferred imaging location?    Answer:  [facility]  . CT Abdomen Pelvis W Contrast    Standing Status: Future     Number of Occurrences:      Standing Expiration Date: 11/07/2015    Order Specific Question:  Reason for Exam (SYMPTOM  OR DIAGNOSIS REQUIRED)    Answer:  staging lymphoma, new onset lymphadenopathy X 2 weeks, mantle cell lymphoma    Order Specific Question:  Preferred imaging location?    Answer:  [facility]  . CT Chest W Contrast    Standing Status: Future     Number of Occurrences:      Standing Expiration Date: 11/07/2015    Order Specific Question:  Reason for Exam (SYMPTOM  OR DIAGNOSIS REQUIRED)    Answer:  staging lymphoma, new onset lymphadenopathy X 2 weeks, mantle cell lymphoma    Order Specific Question:  Preferred imaging location?    Answer:  [facility]  . Lactate dehydrogenase    Standing Status: Future     Number of Occurrences: 1     Standing Expiration Date: 09/11/2015  . Uric Acid    Standing Status: Future     Number of Occurrences: 1     Standing Expiration Date: 09/11/2015   All questions were answered. The patient knows to call the clinic with any problems, questions or concerns. No barriers to learning was detected. I spent 30 minutes counseling the patient face to face. The total time spent in the appointment was 40 minutes and more than 50% was on counseling and review of test results     [facility], [place], MD 08/07/2014 4:27 PM

## 2014-08-07 NOTE — Assessment & Plan Note (Signed)
His symptoms are highly suggestive of gastritis/indigestion. I recommend over-the-counter Prilosec, Zantac and Tum's. If it does not improve, he would need GI referral. The patient have not try anti-emetics yet.

## 2014-08-10 ENCOUNTER — Ambulatory Visit (HOSPITAL_COMMUNITY)
Admission: RE | Admit: 2014-08-10 | Discharge: 2014-08-10 | Disposition: A | Discharge status: Home / Self Care | Payer: Medicare PPO | Source: Ambulatory Visit | Attending: Hematology and Oncology | Admitting: Hematology and Oncology

## 2014-08-10 DIAGNOSIS — C8310 Mantle cell lymphoma, unspecified site: Secondary | ICD-10-CM | POA: Insufficient documentation

## 2014-08-10 MED ORDER — IOHEXOL 300 MG/ML  SOLN
100.0000 mL | Freq: Once | INTRAMUSCULAR | Status: AC | PRN
  Administered 2014-08-10: 100 mL via INTRAVENOUS

## 2014-08-13 ENCOUNTER — Other Ambulatory Visit: Payer: Self-pay | Admitting: *Deleted

## 2014-08-13 ENCOUNTER — Telehealth: Payer: Self-pay | Admitting: Hematology and Oncology

## 2014-08-13 ENCOUNTER — Ambulatory Visit (HOSPITAL_BASED_OUTPATIENT_CLINIC_OR_DEPARTMENT_OTHER): Payer: Medicare PPO | Admitting: Hematology and Oncology

## 2014-08-13 ENCOUNTER — Encounter: Payer: Self-pay | Admitting: Hematology and Oncology

## 2014-08-13 ENCOUNTER — Telehealth: Payer: Self-pay | Admitting: *Deleted

## 2014-08-13 VITALS — BP 126/47 | HR 115 | Temp 98.4°F | Resp 22 | Ht 75.0 in | Wt 199.4 lb

## 2014-08-13 DIAGNOSIS — D696 Thrombocytopenia, unspecified: Secondary | ICD-10-CM

## 2014-08-13 DIAGNOSIS — I959 Hypotension, unspecified: Secondary | ICD-10-CM | POA: Insufficient documentation

## 2014-08-13 DIAGNOSIS — I9589 Other hypotension: Secondary | ICD-10-CM

## 2014-08-13 DIAGNOSIS — D630 Anemia in neoplastic disease: Secondary | ICD-10-CM

## 2014-08-13 DIAGNOSIS — M25512 Pain in left shoulder: Secondary | ICD-10-CM

## 2014-08-13 DIAGNOSIS — C8310 Mantle cell lymphoma, unspecified site: Secondary | ICD-10-CM

## 2014-08-13 MED ORDER — PREDNISONE 20 MG PO TABS: 60.0000 mg | ORAL_TABLET | Freq: Every day | ORAL | Status: DC

## 2014-08-13 MED ORDER — RANITIDINE HCL 150 MG PO TABS: 150.0000 mg | ORAL_TABLET | Freq: Every day | ORAL | Status: DC

## 2014-08-13 MED ORDER — IBRUTINIB 140 MG PO CAPS: 560.0000 mg | ORAL_CAPSULE | Freq: Every day | ORAL | Status: DC

## 2014-08-13 MED ORDER — OMEPRAZOLE 40 MG PO CPDR: 40.0000 mg | DELAYED_RELEASE_CAPSULE | Freq: Every day | ORAL | Status: DC

## 2014-08-13 MED ORDER — OXYCODONE HCL 5 MG PO TABS: 5.0000 mg | ORAL_TABLET | ORAL | Status: DC | PRN

## 2014-08-13 NOTE — Assessment & Plan Note (Signed)
Unfortunately, the patient have high disease burden. I reviewed with him current guidelines. I will attempt to call his transplant physician. I discussed with the patient and his daughter second line treatment for recurrent mantle cell lymphoma. The decision was made based on publication at the [facility]. It is a category 1 recommendation from NCCN. Targeting BTK with Ibrutinib in Relapsed or Refractory Mantle-Cell Lymphoma Michael L. Mina Marble, M.D., Remi Haggard, M.D., Lita Mains, M.D., Lanice Shirts, M.D., Kerrin Champagne, M.D., Ph.D., Gay Filler, M.D., Knute Neu, M.D., Ph.D., Tonye Pearson. Annitta Needs, M.D., Leavy Cella. Oswald Hillock, M.D., Heinz Knuckles. Jimmye Norman, M.D., Link Snuffer, M.D., Leanor Kail, M.D., Olam Idler, M.D., Lequita Asal, M.D., Norberta Keens, M.D., [facility] [place]ie [place], M.D., Marisa Sprinkles, M.D., Harriette Ohara. Retta Mac, M.D., Hope Pigeon, Ph.D., Julieanne Manson, M.D., Ph.D., Felipa Furnace, Ph.D., Nanci Pina, M.D., Toma Copier, M.S., Lauretta Chester, Ph.D., Earnestine Mealing, M.D., Oretha Caprice, [place].D., Azzie Almas, Ph.D., Garnetta Buddy. Radene Knee, Ph.D., Darrin M. Ocie Cornfield, M.D., Ph.D., Elige Radon, M.D., and Tresa Garter. Hetty Ely, M.D. Alison Stalling J Med 2013; [id number] 8, 2013DOI: 10.1056/[id number]  The chemotherapy consists of Ibrutinib, a Bruton's tyrosine kinase inhibitor. We discussed the role of chemotherapy. The intent is for palliative.   Ibrutinib is taken as a single agent oral treatment indefinitely until unexpected side-effects or lack of response.  The chemotherapy yield an average response rate of 68% with complete response rates of 21%. Estimated progression free survival was 13.9 months and median overall survival has not been reached.  Some of the short term side-effects included, though not limited to, risk of fatigue, weight loss, tumor lysis syndrome, risk of allergic reactions, pancytopenia, bruising, diarrhea, transient leukocytosis, life-threatening infections, need for  transfusions of blood products, nausea, vomiting, change in bowel habits, admission to hospital for various reasons, and risks of death.   The patient is aware that the response rates discussed earlier is not guaranteed.    After a long discussion, patient made an informed decision to proceed with the prescribed plan of care.  While awaiting insurance approval, I will start him on 60 mg of prednisone daily.

## 2014-08-13 NOTE — Assessment & Plan Note (Signed)
This, along with splenomegaly is highly suggestive of possible disease recurrence. The patient denies recent history of bleeding such as epistaxis, hematuria or hematochezia. He is asymptomatic from the thrombocytopenia. I will observe for now.  he does not require transfusion now. I told the patient to hold Bactrim.

## 2014-08-13 NOTE — Telephone Encounter (Signed)
Left message for [facility] with referral. Needs to have BP check three times per week due to hypotension

## 2014-08-13 NOTE — Assessment & Plan Note (Signed)
I suspect this is due to sensation of gastritis from his enlarged spleen causing increased reflux. Give him prescription of Prilosec and Zantac for this.

## 2014-08-13 NOTE — Assessment & Plan Note (Signed)
This is likely related to referred pain. I give him prescription of oxycodone to take as needed.

## 2014-08-13 NOTE — Patient Instructions (Signed)
Ibrutinib capsules What is this medicine? IBRUTINIB (eye BROO ti nib) is a chemotherapy drug. It targets proteins in cancer cells and stops the cancer cells from growing. This medicine is used to treat mantle cell lymphoma, chronic lymphocytic leukemia, and Waldenstrom macroglobulinemia. This medicine may be used for other purposes; ask your health care provider or pharmacist if you have questions. COMMON BRAND NAME(S): IMBRUVICA What should I tell my health care provider before I take this medicine? They need to know if you have any of these conditions: -bleeding disorders -heart disease -history of irregular heartbeat -infection -liver disease -recent surgery -smoke tobacco -take medicines that treat or prevent blood clots -an unusual or allergic reaction to ibrutinib, other medicines, foods, dyes, or preservatives -pregnant or trying to get pregnant -breast-feeding How should I use this medicine? Take this medicine by mouth with a glass of water. Follow the directions on the prescription label. Do not cut, crush or chew this medicine. Take your medicine at regular intervals. Do not take it more often than directed. Do not stop taking except on your doctor's advice. Talk to your pediatrician regarding the use of this medicine in children. Special care may be needed. Overdosage: If you think you've taken too much of this medicine contact a poison control center or emergency room at once. Overdosage: If you think you have taken too much of this medicine contact a poison control center or emergency room at once. NOTE: This medicine is only for you. Do not share this medicine with others. What if I miss a dose? If you miss a dose, take it as soon as you can. If it is almost time for your next dose, take only that dose. Do not take double or extra doses. What may interact with this medicine? Do not take this medicine with any of the following  medications: -boceprevir -bosentan -carbamazepine -certain medicines for fungal infections like ketoconazole, itraconazole, posaconazole, and voriconazole -chloramphenicol -clarithromycin -conivaptan -delavirdine -efavirenz -enzalutamide -grapefruit juice -indinavir -isoniazid -lanreotide or octreotide -nefazodone -nelfinavir -nevirapine -nicardipine -phenobarbital -phenytoin -rifampin -ritonavir -saquinavir -[place] oranges -st. john's wort -telaprevir -telithromycin -tipranavir This medicine may also interact with the following medications: -amiodarone -amitriptyline -amprenavir or fosamprenavir -aprepitant or fosaprepitant -atazanavir -bromocriptine -ciprofloxacin -crizotinib -danazol -darunavir -dasatinib -digoxin -diltiazem -erythromycin -fluconazole -fluvoxamine -imatinib -lapatinib -mifepristone, RU-486 -quinine -verapamil -zafirlukast This list may not describe all possible interactions. Give your health care provider a list of all the medicines, herbs, non-prescription drugs, or dietary supplements you use. Also tell them if you smoke, drink alcohol, or use illegal drugs. Some items may interact with your medicine. What should I watch for while using this medicine? This drug may make you feel generally unwell. This is not uncommon, as chemotherapy can affect healthy cells as well as cancer cells. Report any side effects. Continue your course of treatment even though you feel ill unless your doctor tells you to stop. Do not become pregnant while taking this medicine. Women should inform their doctor if they wish to become pregnant or think they might be pregnant. There is a potential for serious side effects to an unborn child. Talk to your health care professional or pharmacist for more information. Do not breast-feed an infant while taking this medicine. This medicine may increase your risk to bruise or bleed. Call your doctor or health care  professional if you notice any unusual bleeding. If you are going to have surgery or any other procedures, tell your doctor you are taking this medicine. Call your doctor or  health care professional for advice if you get a fever, chills or sore throat, or other symptoms of a cold or flu. Do not treat yourself. This drug decreases your body's ability to fight infections. Try to avoid being around people who are sick. You may need blood work done while you are taking this medicine. Talk to your doctor about your risk of cancer. You may be more at risk for certain types of cancers if you take this medicine. What side effects may I notice from receiving this medicine? Side effects that you should report to your doctor or health care professional as soon as possible: - allergic reactions like skin rash, itching or hives, swelling of the face, lips, or tongue -confusion -low blood counts - this medicine may decrease the number of white blood cells, red blood cells and platelets. You may be at increased risk for infections and bleeding -signs or symptoms of bleeding such as bloody or black, tarry stools; red or dark-brown urine; spitting up blood or brown material that looks like coffee grounds; red spots on the skin; unusual bruising or bleeding from the eye, gums, or nose -signs and symptoms of a dangerous change in heartbeat or heart rhythm like chest pain; dizziness; fast or irregular heartbeat; palpitations; feeling faint or lightheaded, falls; breathing problems -signs and symptoms of infection like fever or chills; cough; sore throat; or pain when urinating -signs and symptoms of kidney injury like trouble passing urine or change in the amount of urine -unusually weak or tired Side effects that usually do not require medical attention (report to your doctor or health care professional if they continue or are bothersome): -bone pain -diarrhea -joint pain -muscle pain -nausea This list may not  describe all possible side effects. Call your doctor for medical advice about side effects. You may report side effects to FDA at 1-800-FDA-1088. Where should I keep my medicine? Keep out of the reach of children. Store between 20 and 25 degrees C (68 and 77 degrees F). Keep this medicine in the original container. Throw away any unused medicine after the expiration date. NOTE: This sheet is a summary. It may not cover all possible information. If you have questions about this medicine, talk to your doctor, pharmacist, or health care provider.  2015, Elsevier/Gold Standard. (2013-09-04 20:55:55)

## 2014-08-13 NOTE — Assessment & Plan Note (Signed)
He appears weak. He is at risk for falls at home. I recommended home healthcare nurse to check on his blood pressure.

## 2014-08-13 NOTE — Progress Notes (Signed)
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 In addition, there is moderate splenomegaly with associated hypermetabolic activity consistent with lymphomatous involvement   09/21/2013 - 01/26/2014 Chemotherapy The patient received 6 cycles of bendamustine and rituximab   01/24/2014 Imaging Repeat PET/CT scan showed near complete response to treatment.   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 The patient is seen to review scans. He complained of severe shoulder pain, poor appetite and persistent reflux.  REVIEW OF SYSTEMS:   Constitutional: Denies fevers, chills or abnormal weight loss Eyes: Denies blurriness of vision Ears, nose, mouth, throat, and face: Denies mucositis or sore throat Respiratory: Denies cough, dyspnea or wheezes Cardiovascular: Denies palpitation, chest discomfort or lower  extremity swelling Skin: Denies abnormal skin rashes Lymphatics: Denies new lymphadenopathy or easy bruising Neurological:Denies numbness, tingling or new weaknesses Behavioral/Psych: Mood is stable, no new changes  All other systems were reviewed with the patient and are negative.  I have reviewed the past medical history, past surgical history, social history and family history with the patient and they are unchanged from previous note.  ALLERGIES:  has No Known Allergies.  MEDICATIONS:  Current Outpatient Prescriptions  Medication Sig Dispense Refill  . acetaminophen (TYLENOL) 500 MG tablet Take 1,000 mg by mouth every 6 (six) hours as needed for moderate pain or fever.    Marland Kitchen acyclovir (ZOVIRAX) 800 MG tablet Take 800 mg by mouth 2 (two) times daily.     . cholecalciferol (VITAMIN D) 1000 UNITS tablet Take 2,000 Units by mouth every morning.    . folic acid (FOLVITE) 1 MG tablet Take 1 mg by mouth.    . levothyroxine (SYNTHROID, LEVOTHROID) 75 MCG tablet Take 1 tablet (75 mcg total) by mouth daily. 30 tablet 6  . loratadine (CLARITIN) 10 MG tablet Take 10 mg by mouth every morning.    . Multiple Vitamin tablet Take 1 tablet by mouth.    . nystatin (MYCOSTATIN) 100000 UNIT/ML suspension Take 5 mLs by mouth 4 (four) times daily. Swish and spit    . polyethylene glycol (MIRALAX / GLYCOLAX) packet Take 17 g by mouth daily as needed.    . sulfamethoxazole-trimethoprim (BACTRIM DS) 800-160 MG per tablet Take 1 tablet by mouth.    . ibrutinib (IMBRUVICA) 140 MG capsul Take 4 capsules (560 mg total) by mouth daily. 120 capsule 6  . omeprazole (PRILOSEC) 40 MG capsule Take 1 capsule (40 mg  total) by mouth daily. 90 capsule 3  . oxyCODONE (OXY IR/ROXICODONE) 5 MG immediate release tablet Take 1 tablet (5 mg total) by mouth every 4 (four) hours as needed for severe pain. 60 tablet 0  . predniSONE (DELTASONE) 20 MG tablet Take 3 tablets (60 mg total) by mouth daily with breakfast. 90 tablet 4  .  ranitidine (ZANTAC) 150 MG tablet Take 1 tablet (150 mg total) by mouth at bedtime. 90 tablet 6   No current facility-administered medications for this visit.    PHYSICAL EXAMINATION: ECOG PERFORMANCE STATUS: 2 - Symptomatic, <50% confined to bed  Filed Vitals:   08/13/14 1416  BP: 126/47  Pulse: 115  Temp: 98.4 F (36.9 C)  Resp: 22   Filed Weights   08/13/14 1416  Weight: 199 lb 6.4 oz (90.447 kg)    GENERAL:alert, no distress and comfortable. The patient appears sedated SKIN: skin color, texture, turgor are normal, no rashes or significant lesions EYES: normal, Conjunctiva are pale and non-injected, sclera clear  Musculoskeletal:no cyanosis of digits and no clubbing  NEURO: alert & oriented x 3 with fluent speech, no focal motor/sensory deficits  LABORATORY DATA:  I have reviewed the data as listed    Component Value Date/Time   NA 141 08/07/2014 1536   NA 138 01/28/2014 0655   K 3.6 08/07/2014 1536   K 3.4* 01/28/2014 0655   CL 100 01/28/2014 0655   CO2 24 08/07/2014 1536   CO2 21 01/28/2014 0655   GLUCOSE 98 08/07/2014 1536   GLUCOSE 118* 01/28/2014 0655   BUN 17.2 08/07/2014 1536   BUN 19 01/28/2014 0655   CREATININE 1.4* 08/07/2014 1536   CREATININE 1.42* 01/28/2014 0655   CALCIUM 8.9 08/07/2014 1536   CALCIUM 9.4 01/28/2014 0655   PROT 6.6 08/07/2014 1536   PROT 6.9 01/28/2014 0655   ALBUMIN 3.8 08/07/2014 1536   ALBUMIN 3.4* 01/28/2014 0655   AST 21 08/07/2014 1536   AST 12 01/28/2014 0655   ALT 16 08/07/2014 1536   ALT 10 01/28/2014 0655   ALKPHOS 95 08/07/2014 1536   ALKPHOS 77 01/28/2014 0655   BILITOT 0.63 08/07/2014 1536   BILITOT 0.6 01/28/2014 0655   GFRNONAA 48* 01/28/2014 0655   GFRAA 55* 01/28/2014 0655    No results found for: SPEP, UPEP  Lab Results  Component Value Date   WBC 7.7 08/07/2014   NEUTROABS 2.5 08/07/2014   HGB 9.3* 08/07/2014   HCT 26.9* 08/07/2014   MCV 89.1 08/07/2014   PLT 55* 08/07/2014      Chemistry       Component Value Date/Time   NA 141 08/07/2014 1536   NA 138 01/28/2014 0655   K 3.6 08/07/2014 1536   K 3.4* 01/28/2014 0655   CL 100 01/28/2014 0655   CO2 24 08/07/2014 1536   CO2 21 01/28/2014 0655   BUN 17.2 08/07/2014 1536   BUN 19 01/28/2014 0655   CREATININE 1.4* 08/07/2014 1536   CREATININE 1.42* 01/28/2014 0655      Component Value Date/Time   CALCIUM 8.9 08/07/2014 1536   CALCIUM 9.4 01/28/2014 0655   ALKPHOS 95 08/07/2014 1536   ALKPHOS 77 01/28/2014 0655   AST 21 08/07/2014 1536   AST 12 01/28/2014 0655   ALT 16 08/07/2014 1536   ALT 10 01/28/2014 0655   BILITOT 0.63 08/07/2014 1536   BILITOT 0.6 01/28/2014 0655       RADIOGRAPHIC STUDIES:I reviewed the CT scan with the patient's and his daughter  I have personally reviewed the radiological images as listed and agreed with the findings in the report.  ASSESSMENT & PLAN:  Mantle cell lymphoma  Unfortunately, the patient have high disease burden. I reviewed with him current guidelines. I will attempt to call his transplant physician. I discussed with the patient and his daughter second line treatment for recurrent mantle cell lymphoma. The decision was made based on publication at the [facility]. It is a category 1 recommendation from NCCN. Targeting BTK with Ibrutinib in Relapsed or Refractory Mantle-Cell Lymphoma Michael L. Mina Marble, M.D., Remi Haggard, M.D., Lita Mains, M.D., Lanice Shirts, M.D., Kerrin Champagne, M.D., Ph.D., Gay Filler, M.D., Knute Neu, M.D., Ph.D., Tonye Pearson. Annitta Needs, M.D., Leavy Cella. Oswald Hillock, M.D., Heinz Knuckles. Jimmye Norman, M.D., Link Snuffer, M.D., Leanor Kail, M.D., Olam Idler, M.D., Lequita Asal, M.D., Norberta Keens, M.D., [facility] [place], M.D., Marisa Sprinkles, M.D., Harriette Ohara. Retta Mac, M.D., Hope Pigeon, Ph.D., Julieanne Manson, M.D., Ph.D., Felipa Furnace, Ph.D., Nanci Pina, M.D., Toma Copier, M.S., Lauretta Chester, Ph.D., Earnestine Mealing, M.D., Oretha Caprice, [place].D., Azzie Almas, Ph.D., Garnetta Buddy. Radene Knee, Ph.D., Darrin M. Ocie Cornfield, M.D., Ph.D., Elige Radon, M.D., and Tresa Garter. Hetty Ely, M.D. Alison Stalling J Med 2013; 240:[id number] 8, 2013DOI: 10.1056/[id number]  The chemotherapy consists of Ibrutinib, a Bruton's tyrosine kinase inhibitor. We discussed the role of chemotherapy. The intent is for palliative.   Ibrutinib is taken as a single agent oral treatment indefinitely until unexpected side-effects or lack of response.  The chemotherapy yield an average response rate of 68% with complete response rates of 21%. Estimated progression free survival was 13.9 months and median overall survival has not been reached.  Some of the short term side-effects included, though not limited to, risk of fatigue, weight loss, tumor lysis syndrome, risk of allergic reactions, pancytopenia, bruising, diarrhea, transient leukocytosis, life-threatening infections, need for transfusions of blood products, nausea, vomiting, change in bowel habits, admission to hospital for various reasons, and risks of death.   The patient is aware that the response rates discussed earlier is not guaranteed.    After a long discussion, patient made an informed decision to proceed with the prescribed plan of care.  While awaiting insurance approval, I will start him on 60 mg of prednisone daily.   Anemia in neoplastic disease This is likely anemia of chronic disease. The patient denies recent history of bleeding such as epistaxis, hematuria or hematochezia. He is asymptomatic from the anemia. We will observe for now.  He does not require transfusion now.   Thrombocytopenia This, along with splenomegaly is highly suggestive of possible disease recurrence. The patient denies recent history of bleeding such as epistaxis, hematuria or hematochezia. He is asymptomatic from the thrombocytopenia. I will observe for now.  he does not require transfusion now. I told the patient to hold Bactrim.    Left  shoulder pain This is likely related to referred pain. I give him prescription of oxycodone to take as needed.   Nausea with vomiting I suspect this is due to sensation of gastritis from his enlarged spleen causing increased reflux. Give him prescription of Prilosec and Zantac for this.  Hypotension He appears weak. He is at risk for falls at home. I recommended home healthcare nurse to check on his blood pressure.     Orders Placed This Encounter  Procedures  . Hold Tube, Blood Bank    Standing Status: Standing     Number of Occurrences: 9     Standing Expiration Date: 08/14/2015  All questions were answered. The patient knows to call the clinic with any problems, questions or concerns. No barriers to learning was detected. I spent 40 minutes counseling the patient face to face. The total time spent in the appointment was 60 minutes and more than 50% was on counseling and review of test results     [facility], [place], MD 08/13/2014 3:15 PM

## 2014-08-13 NOTE — Assessment & Plan Note (Signed)
This is likely anemia of chronic disease. The patient denies recent history of bleeding such as epistaxis, hematuria or hematochezia. He is asymptomatic from the anemia. We will observe for now.  He does not require transfusion now.   

## 2014-08-13 NOTE — Telephone Encounter (Signed)
Gave avs & cal for Jan. Sent mess to sch tx. °

## 2014-08-14 ENCOUNTER — Encounter: Payer: Self-pay | Admitting: Hematology and Oncology

## 2014-08-14 ENCOUNTER — Telehealth: Payer: Self-pay | Admitting: *Deleted

## 2014-08-14 DIAGNOSIS — C8310 Mantle cell lymphoma, unspecified site: Secondary | ICD-10-CM

## 2014-08-14 NOTE — Progress Notes (Signed)
[employer] approved imbruvica from 08/14/14-08/14/16

## 2014-08-14 NOTE — Telephone Encounter (Signed)
VM from [facility] in [place] state they got rx for imbruvica but cannot fill it.   Instructed them to disregard this rx,  It was sent to [facility] outpatient pharmacy to fill.  VM from [place], [place] states pt is not taking mycostatin or bactrim.   Notified Dr. Alvy Bimler and she states ok she had instructed pt to stop those meds.

## 2014-08-14 NOTE — Progress Notes (Signed)
Faxed imbruvica pa form to [facility]

## 2014-08-16 ENCOUNTER — Encounter (HOSPITAL_COMMUNITY): Payer: Self-pay

## 2014-08-16 ENCOUNTER — Emergency Department (HOSPITAL_COMMUNITY): Payer: Medicare PPO

## 2014-08-16 ENCOUNTER — Other Ambulatory Visit: Payer: Self-pay

## 2014-08-16 ENCOUNTER — Inpatient Hospital Stay (HOSPITAL_COMMUNITY)
Admission: EM | Admit: 2014-08-16 | Discharge: 2014-08-22 | DRG: 871 | Disposition: A | Discharge status: Home Health | Payer: Medicare PPO | Attending: Internal Medicine | Admitting: Internal Medicine

## 2014-08-16 DIAGNOSIS — E039 Hypothyroidism, unspecified: Secondary | ICD-10-CM | POA: Diagnosis present

## 2014-08-16 DIAGNOSIS — T451X5A Adverse effect of antineoplastic and immunosuppressive drugs, initial encounter: Secondary | ICD-10-CM | POA: Diagnosis present

## 2014-08-16 DIAGNOSIS — R197 Diarrhea, unspecified: Secondary | ICD-10-CM | POA: Diagnosis present

## 2014-08-16 DIAGNOSIS — D6959 Other secondary thrombocytopenia: Secondary | ICD-10-CM | POA: Diagnosis present

## 2014-08-16 DIAGNOSIS — G893 Neoplasm related pain (acute) (chronic): Secondary | ICD-10-CM

## 2014-08-16 DIAGNOSIS — J189 Pneumonia, unspecified organism: Secondary | ICD-10-CM | POA: Diagnosis present

## 2014-08-16 DIAGNOSIS — T380X5A Adverse effect of glucocorticoids and synthetic analogues, initial encounter: Secondary | ICD-10-CM | POA: Diagnosis present

## 2014-08-16 DIAGNOSIS — R0902 Hypoxemia: Secondary | ICD-10-CM

## 2014-08-16 DIAGNOSIS — D630 Anemia in neoplastic disease: Secondary | ICD-10-CM

## 2014-08-16 DIAGNOSIS — D6481 Anemia due to antineoplastic chemotherapy: Secondary | ICD-10-CM | POA: Diagnosis present

## 2014-08-16 DIAGNOSIS — R161 Splenomegaly, not elsewhere classified: Secondary | ICD-10-CM | POA: Diagnosis present

## 2014-08-16 DIAGNOSIS — A419 Sepsis, unspecified organism: Secondary | ICD-10-CM | POA: Diagnosis present

## 2014-08-16 DIAGNOSIS — D6489 Other specified anemias: Secondary | ICD-10-CM

## 2014-08-16 DIAGNOSIS — Z9481 Bone marrow transplant status: Secondary | ICD-10-CM

## 2014-08-16 DIAGNOSIS — Z9484 Stem cells transplant status: Secondary | ICD-10-CM

## 2014-08-16 DIAGNOSIS — Z6825 Body mass index (BMI) 25.0-25.9, adult: Secondary | ICD-10-CM | POA: Diagnosis not present

## 2014-08-16 DIAGNOSIS — Z87891 Personal history of nicotine dependence: Secondary | ICD-10-CM

## 2014-08-16 DIAGNOSIS — Z79899 Other long term (current) drug therapy: Secondary | ICD-10-CM | POA: Diagnosis not present

## 2014-08-16 DIAGNOSIS — N179 Acute kidney failure, unspecified: Secondary | ICD-10-CM | POA: Diagnosis present

## 2014-08-16 DIAGNOSIS — K219 Gastro-esophageal reflux disease without esophagitis: Secondary | ICD-10-CM | POA: Diagnosis present

## 2014-08-16 DIAGNOSIS — R0602 Shortness of breath: Secondary | ICD-10-CM | POA: Diagnosis present

## 2014-08-16 DIAGNOSIS — D559 Anemia due to enzyme disorder, unspecified: Secondary | ICD-10-CM

## 2014-08-16 DIAGNOSIS — Y95 Nosocomial condition: Secondary | ICD-10-CM | POA: Diagnosis present

## 2014-08-16 DIAGNOSIS — J9601 Acute respiratory failure with hypoxia: Secondary | ICD-10-CM | POA: Diagnosis present

## 2014-08-16 DIAGNOSIS — D649 Anemia, unspecified: Secondary | ICD-10-CM | POA: Insufficient documentation

## 2014-08-16 DIAGNOSIS — I959 Hypotension, unspecified: Secondary | ICD-10-CM

## 2014-08-16 DIAGNOSIS — E785 Hyperlipidemia, unspecified: Secondary | ICD-10-CM | POA: Diagnosis present

## 2014-08-16 DIAGNOSIS — R5081 Fever presenting with conditions classified elsewhere: Secondary | ICD-10-CM

## 2014-08-16 DIAGNOSIS — Z452 Encounter for adjustment and management of vascular access device: Secondary | ICD-10-CM

## 2014-08-16 DIAGNOSIS — E43 Unspecified severe protein-calorie malnutrition: Secondary | ICD-10-CM | POA: Diagnosis present

## 2014-08-16 DIAGNOSIS — R0600 Dyspnea, unspecified: Secondary | ICD-10-CM | POA: Diagnosis present

## 2014-08-16 DIAGNOSIS — D62 Acute posthemorrhagic anemia: Secondary | ICD-10-CM | POA: Diagnosis present

## 2014-08-16 DIAGNOSIS — C8310 Mantle cell lymphoma, unspecified site: Secondary | ICD-10-CM | POA: Diagnosis present

## 2014-08-16 DIAGNOSIS — E790 Hyperuricemia without signs of inflammatory arthritis and tophaceous disease: Secondary | ICD-10-CM

## 2014-08-16 DIAGNOSIS — D696 Thrombocytopenia, unspecified: Secondary | ICD-10-CM | POA: Diagnosis present

## 2014-08-16 DIAGNOSIS — Z8572 Personal history of non-Hodgkin lymphomas: Secondary | ICD-10-CM

## 2014-08-16 DIAGNOSIS — D72829 Elevated white blood cell count, unspecified: Secondary | ICD-10-CM

## 2014-08-16 LAB — INFLUENZA PANEL BY PCR (TYPE A & B)
H1N1 flu by pcr: NOT DETECTED
Influenza A By PCR: NEGATIVE
Influenza B By PCR: NEGATIVE

## 2014-08-16 LAB — CBC WITH DIFFERENTIAL/PLATELET
BASOS ABS: 0 10*3/uL (ref 0.0–0.1)
Band Neutrophils: 0 % (ref 0–10)
Basophils Relative: 0 % (ref 0–1)
Blasts: 0 %
EOS ABS: 0 10*3/uL (ref 0.0–0.7)
EOS PCT: 0 % (ref 0–5)
HCT: 21.4 % — ABNORMAL LOW (ref 39.0–52.0)
HEMOGLOBIN: 7.1 g/dL — AB (ref 13.0–17.0)
LYMPHS ABS: 11.7 10*3/uL — AB (ref 0.7–4.0)
Lymphocytes Relative: 65 % — ABNORMAL HIGH (ref 12–46)
MCH: 30.5 pg (ref 26.0–34.0)
MCHC: 33.2 g/dL (ref 30.0–36.0)
MCV: 91.8 fL (ref 78.0–100.0)
MONOS PCT: 3 % (ref 3–12)
MYELOCYTES: 0 %
Metamyelocytes Relative: 0 %
Monocytes Absolute: 0.5 10*3/uL (ref 0.1–1.0)
Neutro Abs: 5.8 10*3/uL (ref 1.7–7.7)
Neutrophils Relative %: 32 % — ABNORMAL LOW (ref 43–77)
PROMYELOCYTES ABS: 0 %
Platelets: 80 10*3/uL — ABNORMAL LOW (ref 150–400)
RBC: 2.33 MIL/uL — ABNORMAL LOW (ref 4.22–5.81)
RDW: 14.3 % (ref 11.5–15.5)
WBC: 18 10*3/uL — ABNORMAL HIGH (ref 4.0–10.5)
nRBC: 1 /100 WBC — ABNORMAL HIGH

## 2014-08-16 LAB — COMPREHENSIVE METABOLIC PANEL
ALT: 21 U/L (ref 0–53)
AST: 29 U/L (ref 0–37)
Albumin: 3.2 g/dL — ABNORMAL LOW (ref 3.5–5.2)
Alkaline Phosphatase: 80 U/L (ref 39–117)
Anion gap: 11 (ref 5–15)
BUN: 21 mg/dL (ref 6–23)
CALCIUM: 8.8 mg/dL (ref 8.4–10.5)
CHLORIDE: 112 meq/L (ref 96–112)
CO2: 20 mmol/L (ref 19–32)
CREATININE: 1.52 mg/dL — AB (ref 0.50–1.35)
GFR calc non Af Amer: 44 mL/min — ABNORMAL LOW (ref >90)
GFR, EST AFRICAN AMERICAN: 51 mL/min — AB (ref >90)
Glucose, Bld: 141 mg/dL — ABNORMAL HIGH (ref 70–99)
Potassium: 3.8 mmol/L (ref 3.5–5.1)
SODIUM: 143 mmol/L (ref 135–145)
TOTAL PROTEIN: 5.7 g/dL — AB (ref 6.0–8.3)
Total Bilirubin: 0.6 mg/dL (ref 0.3–1.2)

## 2014-08-16 LAB — URINALYSIS, ROUTINE W REFLEX MICROSCOPIC
Glucose, UA: NEGATIVE mg/dL
Hgb urine dipstick: NEGATIVE
Ketones, ur: NEGATIVE mg/dL
LEUKOCYTES UA: NEGATIVE
NITRITE: NEGATIVE
PROTEIN: 30 mg/dL — AB
Specific Gravity, Urine: 1.025 (ref 1.005–1.030)
Urobilinogen, UA: 0.2 mg/dL (ref 0.0–1.0)
pH: 5 (ref 5.0–8.0)

## 2014-08-16 LAB — CBC
HCT: 19.2 % — ABNORMAL LOW (ref 39.0–52.0)
HEMOGLOBIN: 6.6 g/dL — AB (ref 13.0–17.0)
MCH: 31 pg (ref 26.0–34.0)
MCHC: 34.4 g/dL (ref 30.0–36.0)
MCV: 90.1 fL (ref 78.0–100.0)
Platelets: 54 10*3/uL — ABNORMAL LOW (ref 150–400)
RBC: 2.13 MIL/uL — AB (ref 4.22–5.81)
RDW: 14.5 % (ref 11.5–15.5)
WBC: 19.1 10*3/uL — ABNORMAL HIGH (ref 4.0–10.5)

## 2014-08-16 LAB — ABO/RH: ABO/RH(D): O POS

## 2014-08-16 LAB — I-STAT CG4 LACTIC ACID, ED
LACTIC ACID, VENOUS: 2.54 mmol/L — AB (ref 0.5–2.2)
Lactic Acid, Venous: 3.8 mmol/L — ABNORMAL HIGH (ref 0.5–2.2)

## 2014-08-16 LAB — PATHOLOGIST SMEAR REVIEW

## 2014-08-16 LAB — MRSA PCR SCREENING: MRSA by PCR: NEGATIVE

## 2014-08-16 LAB — URINE MICROSCOPIC-ADD ON

## 2014-08-16 LAB — I-STAT TROPONIN, ED: Troponin i, poc: 0 ng/mL (ref 0.00–0.08)

## 2014-08-16 LAB — POC OCCULT BLOOD, ED: FECAL OCCULT BLD: NEGATIVE

## 2014-08-16 MED ORDER — OXYCODONE HCL 5 MG PO TABS
5.0000 mg | ORAL_TABLET | Freq: Once | ORAL | Status: AC
  Administered 2014-08-16: 5 mg via ORAL
  Filled 2014-08-16: qty 1

## 2014-08-16 MED ORDER — PIPERACILLIN-TAZOBACTAM 3.375 G IVPB
3.3750 g | Freq: Three times a day (TID) | INTRAVENOUS | Status: DC
  Administered 2014-08-16: 3.375 g via INTRAVENOUS
  Filled 2014-08-16: qty 50

## 2014-08-16 MED ORDER — VANCOMYCIN HCL IN DEXTROSE 1-5 GM/200ML-% IV SOLN
1000.0000 mg | Freq: Once | INTRAVENOUS | Status: AC
  Administered 2014-08-16: 1000 mg via INTRAVENOUS
  Filled 2014-08-16: qty 200

## 2014-08-16 MED ORDER — IBRUTINIB 140 MG PO CAPS
560.0000 mg | ORAL_CAPSULE | Freq: Every day | ORAL | Status: DC
  Administered 2014-08-17 – 2014-08-22 (×6): 560 mg via ORAL
  Filled 2014-08-16: qty 4

## 2014-08-16 MED ORDER — PANTOPRAZOLE SODIUM 40 MG PO TBEC
40.0000 mg | DELAYED_RELEASE_TABLET | Freq: Every day | ORAL | Status: DC
Start: 2014-08-16 — End: 2014-08-22
  Administered 2014-08-16 – 2014-08-22 (×7): 40 mg via ORAL
  Filled 2014-08-16 (×7): qty 1

## 2014-08-16 MED ORDER — ACYCLOVIR 800 MG PO TABS
800.0000 mg | ORAL_TABLET | Freq: Two times a day (BID) | ORAL | Status: DC
  Administered 2014-08-16 – 2014-08-22 (×12): 800 mg via ORAL
  Filled 2014-08-16 (×16): qty 1

## 2014-08-16 MED ORDER — ONDANSETRON HCL 4 MG/2ML IJ SOLN: 4.0000 mg | Freq: Four times a day (QID) | INTRAMUSCULAR | Status: DC | PRN

## 2014-08-16 MED ORDER — ONDANSETRON HCL 4 MG/2ML IJ SOLN
4.0000 mg | Freq: Once | INTRAMUSCULAR | Status: AC
  Administered 2014-08-16: 4 mg via INTRAVENOUS
  Filled 2014-08-16: qty 2

## 2014-08-16 MED ORDER — SODIUM CHLORIDE 0.9 % IV SOLN: 10.0000 mL/h | Freq: Once | INTRAVENOUS | Status: DC

## 2014-08-16 MED ORDER — LEVOTHYROXINE SODIUM 75 MCG PO TABS
75.0000 ug | ORAL_TABLET | Freq: Every day | ORAL | Status: DC
  Filled 2014-08-16: qty 1

## 2014-08-16 MED ORDER — VANCOMYCIN HCL IN DEXTROSE 750-5 MG/150ML-% IV SOLN
750.0000 mg | Freq: Two times a day (BID) | INTRAVENOUS | Status: DC
  Administered 2014-08-17 – 2014-08-18 (×3): 750 mg via INTRAVENOUS
  Filled 2014-08-16 (×5): qty 150

## 2014-08-16 MED ORDER — PREDNISONE 50 MG PO TABS
60.0000 mg | ORAL_TABLET | Freq: Every day | ORAL | Status: DC
  Administered 2014-08-17 – 2014-08-22 (×6): 60 mg via ORAL
  Filled 2014-08-16 (×6): qty 1
  Filled 2014-08-16: qty 3
  Filled 2014-08-16 (×2): qty 1

## 2014-08-16 MED ORDER — LEVOTHYROXINE SODIUM 75 MCG PO TABS
75.0000 ug | ORAL_TABLET | Freq: Every day | ORAL | Status: DC
  Administered 2014-08-17 – 2014-08-22 (×6): 75 ug via ORAL
  Filled 2014-08-16 (×10): qty 1

## 2014-08-16 MED ORDER — ALBUTEROL SULFATE (2.5 MG/3ML) 0.083% IN NEBU: 2.5000 mg | INHALATION_SOLUTION | RESPIRATORY_TRACT | Status: DC | PRN

## 2014-08-16 MED ORDER — ONDANSETRON HCL 4 MG PO TABS: 4.0000 mg | ORAL_TABLET | Freq: Four times a day (QID) | ORAL | Status: DC | PRN

## 2014-08-16 MED ORDER — ACETAMINOPHEN 500 MG PO TABS
500.0000 mg | ORAL_TABLET | Freq: Four times a day (QID) | ORAL | Status: DC | PRN
  Administered 2014-08-16: 500 mg via ORAL
  Filled 2014-08-16: qty 1

## 2014-08-16 MED ORDER — ALBUTEROL SULFATE (2.5 MG/3ML) 0.083% IN NEBU
2.5000 mg | INHALATION_SOLUTION | Freq: Four times a day (QID) | RESPIRATORY_TRACT | Status: DC
  Administered 2014-08-16 – 2014-08-17 (×3): 2.5 mg via RESPIRATORY_TRACT
  Filled 2014-08-16 (×3): qty 3

## 2014-08-16 MED ORDER — SODIUM CHLORIDE 0.9 % IV SOLN
INTRAVENOUS | Status: DC
  Administered 2014-08-17: 06:00:00 via INTRAVENOUS

## 2014-08-16 MED ORDER — PIPERACILLIN-TAZOBACTAM 3.375 G IVPB 30 MIN
3.3750 g | Freq: Once | INTRAVENOUS | Status: AC
  Administered 2014-08-16: 3.375 g via INTRAVENOUS
  Filled 2014-08-16: qty 50

## 2014-08-16 MED ORDER — VITAMIN D3 25 MCG (1000 UNIT) PO TABS
2000.0000 [IU] | ORAL_TABLET | Freq: Every morning | ORAL | Status: DC
  Administered 2014-08-16 – 2014-08-22 (×7): 2000 [IU] via ORAL
  Filled 2014-08-16 (×7): qty 2

## 2014-08-16 MED ORDER — MORPHINE SULFATE 4 MG/ML IJ SOLN: 4.0000 mg | Freq: Once | INTRAMUSCULAR | Status: DC

## 2014-08-16 MED ORDER — LORATADINE 10 MG PO TABS
10.0000 mg | ORAL_TABLET | Freq: Every morning | ORAL | Status: DC
  Administered 2014-08-16 – 2014-08-22 (×7): 10 mg via ORAL
  Filled 2014-08-16 (×7): qty 1

## 2014-08-16 MED ORDER — FAMOTIDINE 20 MG PO TABS
20.0000 mg | ORAL_TABLET | Freq: Two times a day (BID) | ORAL | Status: DC
Start: 2014-08-16 — End: 2014-08-22
  Administered 2014-08-16 – 2014-08-22 (×13): 20 mg via ORAL
  Filled 2014-08-16 (×15): qty 1

## 2014-08-16 MED ORDER — SODIUM CHLORIDE 0.9 % IV BOLUS (SEPSIS)
2000.0000 mL | Freq: Once | INTRAVENOUS | Status: AC
  Administered 2014-08-16: 2000 mL via INTRAVENOUS

## 2014-08-16 MED ORDER — OXYCODONE HCL 5 MG PO TABS
5.0000 mg | ORAL_TABLET | ORAL | Status: DC | PRN
  Administered 2014-08-18: 5 mg via ORAL
  Filled 2014-08-16: qty 1

## 2014-08-16 MED ORDER — ACETAMINOPHEN 325 MG PO TABS: 650.0000 mg | ORAL_TABLET | Freq: Once | ORAL | Status: DC

## 2014-08-16 NOTE — ED Notes (Signed)
Patient states he can not urinate at this time, urinal was placed at bedside

## 2014-08-16 NOTE — ED Notes (Signed)
Attempted to call repor. Receiving nurse to call back.

## 2014-08-16 NOTE — Progress Notes (Signed)
ANTIBIOTIC CONSULT NOTE - INITIAL  Pharmacy Consult for Vanc and Zosyn Indication: rule out sepsis  No Known Allergies  Patient Measurements:   Adjusted Body Weight:   Vital Signs: Temp: 99.1 F (37.3 C) (01/14 1310) Temp Source: Oral (01/14 1310) BP: 126/92 mmHg (01/14 1430) Pulse Rate: 102 (01/14 1430) Intake/Output from previous day:   Intake/Output from this shift: Total I/O In: 1220 [I.V.:550; Blood:335; Other:335] Out: -   Labs:  Recent Labs  08/16/14 0744  WBC 18.0*  HGB 7.1*  PLT 80*  CREATININE 1.52*   Estimated Creatinine Clearance: 52.5 mL/min (by C-G formula based on Cr of 1.52). No results for input(s): VANCOTROUGH, VANCOPEAK, VANCORANDOM, GENTTROUGH, GENTPEAK, GENTRANDOM, TOBRATROUGH, TOBRAPEAK, TOBRARND, AMIKACINPEAK, AMIKACINTROU, AMIKACIN in the last 72 hours.   Microbiology: Recent Results (from the past 720 hour(s))  TECHNOLOGIST REVIEW     Status: None   Collection Time: 08/07/14  3:36 PM  Result Value Ref Range Status   Technologist Review Many Variant lymphs with large nucleoli present  Final    Medical History: Past Medical History  Diagnosis Date  . Thyroid disease   . Hyperlipemia   . Liver disease   . 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 Malnutrition 08/30/2013  . Hypothyroidism   . Depression     Spouse passed 5 months ago  . Gout 09/28/2013    Medications:   (Not in a hospital admission) Anti-infectives    Start     Dose/Rate Route Frequency Ordered Stop   08/17/14 0200  vancomycin (VANCOCIN) IVPB 750 mg/150 ml premix     750 mg150 mL/hr over 60 Minutes Intravenous Every 12 hours 08/16/14 1457     08/16/14 2200  acyclovir (ZOVIRAX) tablet 800 mg     800 mg Oral 2 times daily 08/16/14 1445     08/16/14 1600  piperacillin-tazobactam (ZOSYN) IVPB 3.375 g     3.375 g12.5 mL/hr over 240 Minutes Intravenous Every 8 hours 08/16/14 1458     08/16/14 1530  vancomycin (VANCOCIN) IVPB 1000 mg/200  mL premix     1,000 mg200 mL/hr over 60 Minutes Intravenous  Once 08/16/14 1457     08/16/14 0830  piperacillin-tazobactam (ZOSYN) IVPB 3.375 g     3.375 g100 mL/hr over 30 Minutes Intravenous  Once 08/16/14 0825 08/16/14 1007     Assessment: 74yo M w/ 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 Found to have enlarged spleen, hypotension, and leukocytosis. Pharmacy is asked to dose Vanc and Zosyn for sepsis.   1/14 >> Zosyn >> 1/14 >> Vanc >>    Tmax: 99.1 WBCs: 18 Renal: SCr 1.52, CrCl 53CG  Goal of Therapy:  Vancomycin trough level 15-20 mcg/ml  Appropriate antibiotic dosing for renal function; eradication of infection  Plan:  Vancomycin 1g x 1 then 750mg  IV q12h. Zosyn 3.375g IV Q8H infused over 4hrs. Measure Vanc trough at steady state. Follow up renal fxn and culture results.  Romeo Rabon, PharmD, pager [phone]. 08/16/2014,3:00 PM.

## 2014-08-16 NOTE — Progress Notes (Signed)
  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 [facility] agency name and dtr need assist to f/u chemo med status through [facility] outpatient pharmacy Cm called [phone] Gave dtr this number also Spoke with Bethena Roys with dtr to confirm email from dtr received by Roanna Raider   Action/Plan Detail:   continues to process the chemo med order Dtr will return call as needed   Anticipated DC Date:  08/19/2014     Status Recommendation to Physician:   Result of Recommendation:    Other ED Services  Consult [facility]  Other  Outpatient Services - Pt will follow up    Choice offered to / List presented to:            Status of service:  Completed, signed off  ED Comments:   ED Comments Detail:

## 2014-08-16 NOTE — Progress Notes (Signed)
Manuel [name]   DOB:10-25-1940   TM#:[id number]   FKC#:[id number]  Patient Care Team: Pcp Not In System as PCP - [place], MD as Referring Physician (Hematology and Oncology)   Consulting MD: Heath Lark, MD  Reason for Consult: Mantle Cell Lymphoma  HPI: Mr. 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 He reports increased fatigue. The patient denies any chest pain or shortness of breath. He reports subjective fever, chills, night sweats, denies sick contacts. He denies any bleeding issues such as epistaxis, hematemesis, hematuria or hematochezia. Denies neurological deficits. CBC on admission demonstrated a Hemoglobin of 7.1, requiring 2 units of blood. His white count is 18 and his platelet count is 80,000. The patient denies any recent signs or symptoms of bleeding such as spontaneous epistaxis, hematuria or hematochezia. He complained of bilateral shoulder pain. Appetite is very poor. He complained of profound weakness. Differential is remarkable for a lymphocyte count of 11.7. ANC is normal at 5.8. Hemoccult is negative. Smear is pending. Liver function tests are normal. Urine is remarkable for small bilirubin otherwise essentially negative. No anemia panel has been drawn. Chest x-ray is negative for acute findings. Abdominal x-rays however, is remarkable for massive splenomegaly.  We will kindly informed of the patient's admission.  SUMMARY OF ONCOLOGIC HISTORY: Oncology History   Mantle cell lymphoma  Primary site: Lymphoid Neoplasms (Bilateral)  Staging method: AJCC 6th Edition  Clinical: Stage IV signed by Heath Lark, MD on 09/26/2013 10:53 AM  Pathologic: Stage IV signed by Heath Lark, MD on 09/26/2013 10:53 AM   Summary: Stage IV       Mantle cell lymphoma   09/12/2013 Procedure Patient have right axillary lymph node biopsy that confirmed B-cell, non-Hodgkin's lymphoma, suspect mantle cell lymphoma   09/12/2013 Procedure Patient had placement of Infuse-a-Port   09/16/2013 Imaging PET/CT scan showed multifocal hypermetabolic nodal activity involving the neck, chest, abdomen and pelvis as described. In addition, there is moderate splenomegaly with associated hypermetabolic activity consistent with lymphomatous involvement   09/21/2013 - 01/26/2014 Chemotherapy The patient received 6 cycles of bendamustine and rituximab   01/24/2014 Imaging Repeat PET/CT scan showed near complete response to treatment.   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   07/13/2014 Imaging repeat PET CT scan show complete remission   08/10/2014 Imaging repeat CT scan of the neck, chest and abdomen show disease relapse     Scheduled Meds:  Continuous Infusions: . sodium chloride     PRN Meds:   Objective:  Filed Vitals:   08/16/14 1310  BP: 114/61  Pulse: 99  Temp: 99.1 F (37.3 C)  Resp:       Intake/Output Summary (Last 24 hours) at 08/16/14 1322 Last data filed at 08/16/14 1012  Gross per 24 hour  Intake    835 ml  Output      0 ml  Net    835 ml    ECOG PERFORMANCE STATUS:2  GENERAL:alert, no distress and ill appearing SKIN: skin color is pale, texture, turgor are normal, no rashes or significant lesions EYES: normal, conjunctiva are pale and non-injected, sclera clear OROPHARYNX:no exudate, no erythema and lips, buccal mucosa, and tongue normal  NECK: supple, thyroid normal size, non-tender, without nodularity LYMPH:  no  palpable lymphadenopathy in the cervical, axillary or inguinal LUNGS: clear to auscultation and percussion with normal breathing effort HEART: regular rate & rhythm and no murmurs and no  lower extremity edema ABDOMEN:abdomen soft, exquisite tenderness on the left upper quadrant. Enlarged, palpable spleen, 5 -6 cm below costal border. Normal bowel sounds Musculoskeletal:no cyanosis of digits and no clubbing  PSYCH: alert & oriented x 3 with fluent speech NEURO: no focal motor/sensory deficits    CBG (last 3)  No results for input(s): GLUCAP in the last 72 hours.   Labs:   Recent Labs Lab 08/16/14 0744  WBC 18.0*  HGB 7.1*  HCT 21.4*  PLT 80*  MCV 91.8  MCH 30.5  MCHC 33.2  RDW 14.3  LYMPHSABS 11.7*  MONOABS 0.5  EOSABS 0.0  BASOSABS 0.0     Chemistries:    Recent Labs Lab 08/16/14 0744  NA 143  K 3.8  CL 112  CO2 20  GLUCOSE 141*  BUN 21  CREATININE 1.52*  CALCIUM 8.8  AST 29  ALT 21  ALKPHOS 80  BILITOT 0.6    GFR Estimated Creatinine Clearance: 52.5 mL/min (by C-G formula based on Cr of 1.52).  Liver Function Tests:  Recent Labs Lab 08/16/14 0744  AST 29  ALT 21  ALKPHOS 80  BILITOT 0.6  PROT 5.7*  ALBUMIN 3.2*   No results for input(s): LIPASE, AMYLASE in the last 168 hours. No results for input(s): AMMONIA in the last 168 hours.  Urine Studies     Component Value Date/Time   COLORURINE AMBER* 08/16/2014 1010   APPEARANCEUR CLOUDY* 08/16/2014 1010   LABSPEC 1.025 08/16/2014 1010   PHURINE 5.0 08/16/2014 1010   GLUCOSEU NEGATIVE 08/16/2014 1010   HGBUR NEGATIVE 08/16/2014 1010   BILIRUBINUR SMALL* 08/16/2014 1010   KETONESUR NEGATIVE 08/16/2014 1010   PROTEINUR 30* 08/16/2014 1010   UROBILINOGEN 0.2 08/16/2014 1010   NITRITE NEGATIVE 08/16/2014 1010   LEUKOCYTESUR NEGATIVE 08/16/2014 1010    Microbiology Recent Results (from the past 240 hour(s))  TECHNOLOGIST REVIEW     Status: None   Collection Time: 08/07/14  3:36 PM  Result Value Ref Range Status   Technologist Review Many Variant lymphs with large nucleoli present  Final     Imaging Studies:  Dg Chest Port 1 View  08/16/2014   CLINICAL DATA:  Acute  onset shortness of breath and hypoxemia earlier today. Current history of mantle cell lymphoma.  EXAM: PORTABLE CHEST - 1 VIEW  COMPARISON:  Two-view chest x-ray 01/28/2014 and portable chest x-ray 09/12/2013.  FINDINGS: Cardiac silhouette normal in size, unchanged. Lungs clear. Bronchovascular markings normal. Pulmonary vascularity normal. No visible pleural effusions. No pneumothorax. Right jugular Port-A-Cath tip projects over the upper SVC, unchanged.  IMPRESSION: No acute cardiopulmonary disease.   Electronically Signed   By: Evangeline Dakin M.D.   On: 08/16/2014 08:07   Dg Abd Portable 1v  08/16/2014   CLINICAL DATA:  Acute onset mid abdominal pain and diarrhea earlier today. Current history of mantle cell lymphoma.  EXAM: PORTABLE ABDOMEN - 1 VIEW  COMPARISON:  CT abdomen and pelvis 08/10/2014 and PET-CT 07/13/2014, 01/24/2014, 09/16/2013.  FINDINGS: Bowel gas pattern unremarkable without evidence of obstruction or significant ileus. Expected stool burden in the colon. Apparent calcification overlying the right upper quadrant was not present on the CT 6 days ago and therefore may be artifactual an external to the patient. Massive splenic enlargement as noted on the CT. Large phleboliths low in the left side  of the pelvis. No visible opaque urinary tract calculi. Prior L4-5 and L5-S1 posterior decompression and fusion with degenerative changes in the lower lumbar spine and in the left hip.  IMPRESSION: 1. No acute abdominal abnormality. Apparent calcification overlying the right upper quadrant was not present on the CT 6 days ago and therefore is likely artifactual. 2. Massive splenomegaly as noted on the recent CT.   Electronically Signed   By: Evangeline Dakin M.D.   On: 08/16/2014 08:14    Assessment/Plan: 74 y.o.    Mantle cell lymphoma Unfortunately, the patient have high disease burden.  Second line treatment for recurrent mantle cell lymphoma was discussed on 08/13/14. Intent is palliative.    Ibrutinib, a Bruton's tyrosine kinase inhibitor was to be initiated. While awaiting insurance approval,  he was started on 60 mg of prednisone daily. Continue present therapy  Anemia in neoplastic disease This is likely anemia of chronic disease, splenomegaly, malignancy.  The patient denies recent history of bleeding such as epistaxis, hematuria or hematochezia.  He is symptomatic today with a Hb of 7.1 He is receiving 2 units of blood . Will recheck labs after completion of transfusion. Plan to keep hemoglobin >8g  Thrombocytopenia This, along with splenomegaly is highly suggestive of possible disease recurrence. The patient denies recent history of bleeding such as epistaxis, hematuria or hematochezia.  He is asymptomatic from the thrombocytopenia. Will  observe for now. Bactrim placed on hold since 1/11  Abdominal pain This is due to splenomegaly in the setting of malignancy CT of the abdomen and pelvis on 1/8 showed significant increase in the size and volume of the spleen with development of a low-attenuation lesion in the posterior subcapsular spleen. Will monitor for now. Pain medication prn  Leukocytosis This is due to Prednisone, inflammation, pain, malignancy He is febrile recommend cultures and broad spectrum coverage No intervention is indicated at this time Will continue to monitor  Bilateral shoulder pain This is likely related to referred pain. On oxycodone to take as needed.  Nausea with vomiting Suspect this is due to sensation of gastritis from his enlarged spleen causing increased reflux. Continue Prilosec and Zantac   Hypotension He appears weak. He is at risk for falls Provide IV fluids Can ambulate with assistance only  Malnutrition Consider Nutrition evaluation  Discharge planning Unlikely to be discharged until next week  Other medical issues as per admitting team     **Disclaimer: This note was dictated with voice recognition software.  Similar sounding words can inadvertently be transcribed and this note may contain transcription errors which may not have been corrected upon publication of note.** WERTMAN,SARA E, PA-C 08/16/2014  1:22 PM Merrell Rettinger, MD 08/16/2014

## 2014-08-16 NOTE — ED Notes (Signed)
Bed: YI94 Expected date: 08/16/14 Expected time: 7:01 AM Means of arrival: Ambulance Comments: flulike sx/[place]

## 2014-08-16 NOTE — ED Notes (Signed)
Patient is still unable to urinate, urinal is placed at bed side

## 2014-08-16 NOTE — H&P (Addendum)
[facility] History and Physical  Shariq Puig ZHG:[id number] DOB: July 10, 1941 DOA: 08/16/2014  Referring physician: ED physician PCP: Shirleen Schirmer   Chief Complaint: dyspnea and chest pain  HPI:  Pt is 74 yo male with mantle cell lymphoma, s/p stem cell transplant in Sept 2015, currently on Prednisone 60 mg tablet daily, under the care of Dr. Alvy Bimler, presented to [facility] ED with main concern of several days duration of progressively worsening dyspnea initially present with exertion but now present at rest as well over the past 24 hours. This has been associated with anterior area chest pain radiating to bilateral shoulders, intermittent in nature and lasting several minutes at the time, 7/10 in severity when present, no specific alleviating factors, no similar events in the past. Daughter at bedside explains her dad is rather active at baseline and has been doing really well after the transplant, has never required transfusions in the past, no known blood in urine or stool. Pt denies fevers, chills, no specific urinary or abdominal concerns, no sick contacts or exposures, no focal neurological symptoms. Pt has seen his oncologist last week and explains that CT scans were reviewed in detail at that time.   In ED, pt is hemodynamically stable, VS notable for initial oxygen saturation in low 80's on RA, T 100.9 F. Blood work notable for WBC 18K, Hg 7.1, Plt 80. Pt transfused PRBC (2 U PRBC ordered by ED doctor). Oncologist consulted and [place] asked to admit to SDU for further evaluation and management.   Assessment and Plan: Principal Problem:   Sepsis - criteria on admission met: pt with T 100.9, WBC 18K, RR > 25 bpm, oxygen sat in 80's on AM, renal failure, ABLA - no clear source elicited at this time, WBC could be elevated from Prednisone use  - pt already started on Vancomycin and Zosyn in ED, will continue same regimen for now until more data back - urine culture and blood culture  requested in ED  Active Problems:   Acute respiratory failure with hypoxia - appears to be related to acute blood loss anemia, no signs of PNA on CXR - will monitor closely in SDU, if respiratory status does not improve with transfusion, consider alternate etiologies, ? PE   Chest pain  - appears to be demand ischemia from acute blood loss  - hopefully this will resolved with blood transfusion  - monitor on telemetry    Mantle cell lymphoma - management per oncology team, appreciate assistance  - per Dr. Alvy Bimler, pt with high disease burden - intent of the future treatment is palliative  - continue Prednisone 60 mg daily    Anemia in neoplastic disease, splenomegaly  - currently being transfused first unit of PRBC out of two requested  - repeat CBC in AM   Thrombocytopenia  - from splenomegaly and disease recurrence  - Plt 55 K 9 days PTA but was WNL 2 months ago - no signs of active bleeding at this time but will need to monitor closely - repeat CBC in AM, SCD's for DVT prophylaxis    Acute renal failure - appears to be pre renal in etiology - provide IVF, blood transfusion and repeat BMP in AM   Hypothyroidism - continue synthroid    S/P autologous bone marrow transplantation   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     ABD xray 08/16/2014   No acute abdominal abnormality. Apparent calcification overlying the right upper quadrant was not present on the CT 6 days ago and therefore is likely artifactual. Massive splenomegaly as noted on the recent CT.  Code Status: Full Family Communication: Pt, daughter, friend, at bedside. I discussed the code status, for now FULL CODE but pt also wants to take time to think this through.  Disposition Plan: Admit for further evaluation  Review of Systems:  Constitutional:Negative  for diaphoresis.  HENT: Negative for hearing loss, ear pain, nosebleeds, congestion, sore throat, neck pain, tinnitus and ear discharge.   Eyes: Negative for blurred vision, double vision, photophobia, pain, discharge and redness.  Respiratory: Negative for cough, hemoptysis, wheezing and stridor.   Cardiovascular: Negative for palpitations, orthopnea, claudication and leg swelling.  Gastrointestinal: Negative for vomiting. Genitourinary: Negative for dysuria, urgency, frequency, hematuria and flank pain.  Musculoskeletal: Negative for joint pain and falls.  Skin: Negative for itching and rash.  Neurological: Negative for dizziness and weakness. Endo/Heme/Allergies: Negative for environmental allergies and polydipsia. Does not bruise/bleed easily.  Psychiatric/Behavioral: Negative for suicidal ideas. The patient is not nervous/anxious.      Past Medical History  Diagnosis Date  . Thyroid disease   . Hyperlipemia   . Liver disease   . 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 Malnutrition 08/30/2013  . Hypothyroidism   . Depression     Spouse passed 5 months ago  . Gout 09/28/2013    Past Surgical History  Procedure Laterality Date  . Back surgery      1985 and 1986  . Axillary lymph node biopsy Right 09/12/2013    Procedure: RIGHT AXILLARY LYMPH NODE BIOPSY;  Surgeon: Marcello Moores A. Cornett, MD;  Location: [facility];  Service: General;  Laterality: Right;  . Portacath placement Right 09/12/2013    Procedure: INSERTION PORT-A-CATH;  Surgeon: Joyice Faster. Cornett, MD;  Location: [facility];  Service: General;  Laterality: Right;    Social History:  reports that he has quit smoking. His smoking use included Cigarettes. He has a 13 pack-year smoking history. He has never used smokeless tobacco. He reports that he does not drink alcohol or use illicit drugs.  No Known Allergies  Family History  Problem Relation Age of Onset  .  Cancer Mother     breast ca  . Cancer Sister     NHL  . Cancer Brother     ?colon can  . Cancer Brother     lung ca    Prior to Admission medications   Medication Sig Start Date End Date Taking? Authorizing Provider  acetaminophen (TYLENOL) 500 MG tablet Take 500 mg by mouth every 6 (six) hours as needed for moderate pain or fever.    Yes Historical Provider, MD  acyclovir (ZOVIRAX) 800 MG tablet Take 800 mg by mouth 2 (two) times daily.  04/30/14  Yes Historical Provider, MD  cholecalciferol (VITAMIN D) 1000 UNITS tablet Take 2,000 Units by mouth every morning.   Yes Historical Provider, MD  levothyroxine (SYNTHROID, LEVOTHROID) 75 MCG tablet Take 1 tablet (75 mcg total) by mouth daily. 03/22/14  Yes Heath Lark, MD  loratadine (CLARITIN) 10 MG tablet Take 10 mg by mouth every morning.   Yes Historical Provider, MD  Multiple Vitamin tablet Take 1 tablet by mouth. 04/27/14  Yes Historical Provider, MD  omeprazole (PRILOSEC) 40 MG capsule Take 1 capsule (  40 mg total) by mouth daily. 08/13/14  Yes Heath Lark, MD  oxyCODONE (OXY IR/ROXICODONE) 5 MG immediate release tablet Take 1 tablet (5 mg total) by mouth every 4 (four) hours as needed for severe pain. 08/13/14  Yes Heath Lark, MD  polyethylene glycol (MIRALAX / GLYCOLAX) packet Take 17 g by mouth daily as needed. 10/02/13  Yes Heath Lark, MD  predniSONE (DELTASONE) 20 MG tablet Take 3 tablets (60 mg total) by mouth daily with breakfast. 08/13/14  Yes Heath Lark, MD  ranitidine (ZANTAC) 150 MG tablet Take 1 tablet (150 mg total) by mouth at bedtime. 08/13/14  Yes Heath Lark, MD  ibrutinib (IMBRUVICA) 140 MG capsul Take 4 capsules (560 mg total) by mouth daily. 08/13/14   Heath Lark, MD    Physical Exam: Filed Vitals:   08/16/14 1030 08/16/14 1130 08/16/14 1310 08/16/14 1330  BP: 102/50 102/50 114/61 136/63  Pulse: 87 95 99 100  Temp:   99.1 F (37.3 C)   TempSrc:   Oral   Resp: 25 16  21   SpO2: 100% 100%  97%    Physical Exam   Constitutional: Appears well-developed and well-nourished. No distress.  HENT: Normocephalic. External right and left ear normal. Dry MM Eyes: Conjunctivae and EOM are normal. PERRLA, no scleral icterus.  Neck: Normal ROM. Neck supple. No JVD. No tracheal deviation. No thyromegaly.  CVS: Regular rhythm, tachycardic, S1/S2 +, no murmurs, no gallops, no carotid bruit.  Pulmonary: diminished breath sounds at bases, tachypnea with RR in mid 20's Abdominal: Soft. BS +,  slight distension and tenderness in epigastric area, no rebound or guarding.  Musculoskeletal: Normal range of motion. No edema and no tenderness.  Lymphadenopathy: No lymphadenopathy noted, cervical, inguinal. Neuro: Alert. Normal reflexes, muscle tone coordination. No cranial nerve deficit. Skin: Skin is warm and dry. No rash noted. Not diaphoretic. No erythema. No pallor.  Psychiatric: Normal mood and affect. Behavior, judgment, thought content normal.   Labs on Admission:  Basic Metabolic Panel:  Recent Labs Lab 08/16/14 0744  NA 143  K 3.8  CL 112  CO2 20  GLUCOSE 141*  BUN 21  CREATININE 1.52*  CALCIUM 8.8   Liver Function Tests:  Recent Labs Lab 08/16/14 0744  AST 29  ALT 21  ALKPHOS 80  BILITOT 0.6  PROT 5.7*  ALBUMIN 3.2*   CBC:  Recent Labs Lab 08/16/14 0744  WBC 18.0*  NEUTROABS 5.8  HGB 7.1*  HCT 21.4*  MCV 91.8  PLT 80*   EKG: Normal sinus rhythm, no ST/T wave changes  Faye Ramsay, MD  [facility] Pager [phone] Cell [phone]  If 7PM-7AM, please contact night-coverage [email or web address] Password [email or web address] 08/16/2014, 1:54 PM

## 2014-08-16 NOTE — ED Notes (Signed)
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

## 2014-08-16 NOTE — ED Provider Notes (Signed)
CSN: [id number]     Arrival date & time 08/16/14  4627 History   First MD Initiated Contact with Patient 08/16/14 [phone]     Chief Complaint  Patient presents with  . Diarrhea  . 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  Pt reports one episode of vomiting and one episode of diarrhea, non-bloody no melena. Patient reports subjective fever.  Not on Home O2. No known sick contacts. Reports slight dyspnea today, denies chest pain lower extremity swelling, palpitations. Reports left shoulder pain, that is chronic and unchanged. Patient lives with daughter.  Lajean Saver, MD as Referring Physician (Hematology and Oncology)   Patient is a 74 y.o. male presenting with diarrhea and weakness. The history is provided by the patient. No language interpreter was used.  Diarrhea Associated symptoms: abdominal pain, fever and vomiting   Associated symptoms: no chills   Weakness Associated symptoms include abdominal pain, coughing, a fever, nausea, vomiting and weakness. Pertinent negatives include no chest pain or chills.    Past Medical History  Diagnosis Date  . Thyroid disease   . Hyperlipemia   . Liver disease   . 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 Malnutrition 08/30/2013  . Hypothyroidism   . Depression     Spouse passed 5 months ago  . Gout 09/28/2013   Past Surgical History  Procedure Laterality Date  . Back surgery      1985 and 1986  . Axillary lymph node biopsy Right 09/12/2013    Procedure: RIGHT AXILLARY LYMPH NODE BIOPSY;  Surgeon: Marcello Moores A. Cornett, MD;  Location: [facility];  Service: General;  Laterality: Right;   . Portacath placement Right 09/12/2013    Procedure: INSERTION PORT-A-CATH;  Surgeon: Joyice Faster. Cornett, MD;  Location: [facility];  Service: General;  Laterality: Right;   Family History  Problem Relation Age of Onset  . Cancer Mother     breast ca  . Cancer Sister     NHL  . Cancer Brother     ?colon can  . Cancer Brother     lung ca   History  Substance Use Topics  . Smoking status: Former Smoker -- 0.25 packs/day for 52 years    Types: Cigarettes  . Smokeless tobacco: Never Used  . Alcohol Use: No    Review of Systems  Constitutional: Positive for fever. Negative for chills.  Respiratory: Positive for cough and shortness of breath.   Cardiovascular: Negative for chest pain, palpitations and leg swelling.  Gastrointestinal: Positive for nausea, vomiting, abdominal pain and diarrhea. Negative for constipation and blood in stool.  Neurological: Positive for weakness.      Allergies  Review of patient's allergies indicates no known allergies.  Home Medications   Prior to Admission medications   Medication Sig Start Date End Date Taking? Authorizing Provider  acetaminophen (TYLENOL) 500 MG tablet Take 1,000 mg by mouth every 6 (six) hours as needed for moderate pain or fever.    Historical Provider, MD  acyclovir (ZOVIRAX) 800 MG tablet Take 800 mg by mouth 2 (two) times daily.  04/30/14   Historical Provider, MD  cholecalciferol (VITAMIN D) 1000 UNITS tablet Take 2,000 Units by mouth every morning.  Historical Provider, MD  folic acid (FOLVITE) 1 MG tablet Take 1 mg by mouth. 04/27/14   Historical Provider, MD  ibrutinib (IMBRUVICA) 140 MG capsul Take 4 capsules (560 mg total) by mouth daily. 08/13/14   Heath Lark, MD  levothyroxine (SYNTHROID, LEVOTHROID) 75 MCG tablet Take 1 tablet (75 mcg total) by mouth daily. 03/22/14   Heath Lark, MD  loratadine (CLARITIN) 10 MG tablet Take 10 mg by mouth every morning.    Historical Provider, MD  Multiple Vitamin  tablet Take 1 tablet by mouth. 04/27/14   Historical Provider, MD  omeprazole (PRILOSEC) 40 MG capsule Take 1 capsule (40 mg total) by mouth daily. 08/13/14   Heath Lark, MD  oxyCODONE (OXY IR/ROXICODONE) 5 MG immediate release tablet Take 1 tablet (5 mg total) by mouth every 4 (four) hours as needed for severe pain. 08/13/14   Heath Lark, MD  polyethylene glycol (MIRALAX / GLYCOLAX) packet Take 17 g by mouth daily as needed. 10/02/13   Heath Lark, MD  predniSONE (DELTASONE) 20 MG tablet Take 3 tablets (60 mg total) by mouth daily with breakfast. 08/13/14   Heath Lark, MD  ranitidine (ZANTAC) 150 MG tablet Take 1 tablet (150 mg total) by mouth at bedtime. 08/13/14   Heath Lark, MD   There were no vitals taken for this visit. Physical Exam  Constitutional: He is oriented to person, place, and time. He appears well-developed and well-nourished. No distress.  Chronically ill appearing male.  HENT:  Head: Normocephalic and atraumatic.  Eyes: EOM are normal.  Neck: Neck supple.  Cardiovascular: Normal rate and regular rhythm.   Pulmonary/Chest: Effort normal and breath sounds normal. No respiratory distress. He has no wheezes. He has no rales.  Right upper chest wall: Port-A-Cath site non-tender.  Abdominal: Soft. There is tenderness in the left upper quadrant and left lower quadrant. There is guarding. There is no rebound.  Genitourinary:  Tan stool on glove. No obvious blood.  Neurological: He is alert and oriented to person, place, and time.  Skin: Skin is warm and dry. He is not diaphoretic. There is pallor.  Psychiatric: He has a normal mood and affect. His behavior is normal.  Nursing note and vitals reviewed.   ED Course  Procedures (including critical care time) Labs Review Labs Reviewed - No data to display  Imaging Review Dg Chest Port 1 View  08/16/2014   CLINICAL DATA:  Acute onset shortness of breath and hypoxemia earlier today. Current history of mantle cell lymphoma.  EXAM:  PORTABLE CHEST - 1 VIEW  COMPARISON:  Two-view chest x-ray 01/28/2014 and portable chest x-ray 09/12/2013.  FINDINGS: Cardiac silhouette normal in size, unchanged. Lungs clear. Bronchovascular markings normal. Pulmonary vascularity normal. No visible pleural effusions. No pneumothorax. Right jugular Port-A-Cath tip projects over the upper SVC, unchanged.  IMPRESSION: No acute cardiopulmonary disease.   Electronically Signed   By: Evangeline Dakin M.D.   On: 08/16/2014 08:07   Dg Abd Portable 1v  08/16/2014   CLINICAL DATA:  Acute onset mid abdominal pain and diarrhea earlier today. Current history of mantle cell lymphoma.  EXAM: PORTABLE ABDOMEN - 1 VIEW  COMPARISON:  CT abdomen and pelvis 08/10/2014 and PET-CT 07/13/2014, 01/24/2014, 09/16/2013.  FINDINGS: Bowel gas pattern unremarkable without evidence of obstruction or significant ileus. Expected stool burden in the colon. Apparent calcification overlying the right upper quadrant was not present on the CT 6 days ago and therefore may be artifactual an external to the patient. Massive splenic enlargement as  noted on the CT. Large phleboliths low in the left side of the pelvis. No visible opaque urinary tract calculi. Prior L4-5 and L5-S1 posterior decompression and fusion with degenerative changes in the lower lumbar spine and in the left hip.  IMPRESSION: 1. No acute abdominal abnormality. Apparent calcification overlying the right upper quadrant was not present on the CT 6 days ago and therefore is likely artifactual. 2. Massive splenomegaly as noted on the recent CT.   Electronically Signed   By: Evangeline Dakin M.D.   On: 08/16/2014 08:14     EKG Interpretation None      MDM   Final diagnoses:  Diarrhea  Hypoxia  Anemia due to other cause  Hypotension, unspecified hypotension type  History of non-Hodgkin's lymphoma   Pt presents with abdominal pain, diarrhea, and vomiting, fever.  Pt also with low SpO2 on arrival 79%, pt was placed on 5 L  and SpO2 rose to 100%.  Pt with thrombocytopenia last platelet 08/07/2014 55, thought secondary to splenomegaly and disease recurrence.  Patient will likely need admission. Dr. Darl Householder also evaluated the patient during this encounter. Chest XR without infection. Abdominal XR shows splenomegaly, consistent with recent CT. CBC shows anemia 7.1, down from 9.3 9 days ago. Negative hemoccult.  UA without infection Plant to admit for hypoxia, anemia. Doyle Askew to admit the patient.  Harvie Heck, PA-C 08/16/14 1641  Wandra Arthurs, MD 08/17/14 [phone]

## 2014-08-17 DIAGNOSIS — J9601 Acute respiratory failure with hypoxia: Secondary | ICD-10-CM

## 2014-08-17 DIAGNOSIS — A419 Sepsis, unspecified organism: Principal | ICD-10-CM

## 2014-08-17 DIAGNOSIS — R0600 Dyspnea, unspecified: Secondary | ICD-10-CM

## 2014-08-17 DIAGNOSIS — D696 Thrombocytopenia, unspecified: Secondary | ICD-10-CM

## 2014-08-17 DIAGNOSIS — Z9481 Bone marrow transplant status: Secondary | ICD-10-CM

## 2014-08-17 LAB — URINE CULTURE
COLONY COUNT: NO GROWTH
CULTURE: NO GROWTH

## 2014-08-17 LAB — CBC
HCT: 20.3 % — ABNORMAL LOW (ref 39.0–52.0)
Hemoglobin: 7 g/dL — ABNORMAL LOW (ref 13.0–17.0)
MCH: 31 pg (ref 26.0–34.0)
MCHC: 34.5 g/dL (ref 30.0–36.0)
MCV: 89.8 fL (ref 78.0–100.0)
PLATELETS: 45 10*3/uL — AB (ref 150–400)
RBC: 2.26 MIL/uL — AB (ref 4.22–5.81)
RDW: 14.9 % (ref 11.5–15.5)
WBC: 17.5 10*3/uL — ABNORMAL HIGH (ref 4.0–10.5)

## 2014-08-17 LAB — BASIC METABOLIC PANEL WITH GFR
Anion gap: 9 (ref 5–15)
BUN: 21 mg/dL (ref 6–23)
CO2: 20 mmol/L (ref 19–32)
Calcium: 7.9 mg/dL — ABNORMAL LOW (ref 8.4–10.5)
Chloride: 111 meq/L (ref 96–112)
Creatinine, Ser: 1.57 mg/dL — ABNORMAL HIGH (ref 0.50–1.35)
GFR calc Af Amer: 49 mL/min — ABNORMAL LOW
GFR calc non Af Amer: 42 mL/min — ABNORMAL LOW
Glucose, Bld: 110 mg/dL — ABNORMAL HIGH (ref 70–99)
Potassium: 3.6 mmol/L (ref 3.5–5.1)
Sodium: 140 mmol/L (ref 135–145)

## 2014-08-17 LAB — PREPARE RBC (CROSSMATCH)

## 2014-08-17 MED ORDER — ALBUTEROL SULFATE (2.5 MG/3ML) 0.083% IN NEBU
2.5000 mg | INHALATION_SOLUTION | Freq: Two times a day (BID) | RESPIRATORY_TRACT | Status: DC
  Administered 2014-08-17 – 2014-08-20 (×5): 2.5 mg via RESPIRATORY_TRACT
  Filled 2014-08-17 (×8): qty 3

## 2014-08-17 MED ORDER — ALBUTEROL SULFATE (2.5 MG/3ML) 0.083% IN NEBU: 2.5000 mg | INHALATION_SOLUTION | RESPIRATORY_TRACT | Status: DC | PRN

## 2014-08-17 MED ORDER — SODIUM CHLORIDE 0.9 % IV SOLN
Freq: Once | INTRAVENOUS | Status: AC
  Administered 2014-08-17: 14:00:00 via INTRAVENOUS

## 2014-08-17 MED ORDER — SODIUM CHLORIDE 0.9 % IV SOLN
Freq: Once | INTRAVENOUS | Status: AC
  Administered 2014-08-17: 250 mL via INTRAVENOUS

## 2014-08-17 MED ORDER — BOOST / RESOURCE BREEZE PO LIQD
1.0000 | Freq: Three times a day (TID) | ORAL | Status: DC
  Administered 2014-08-17 – 2014-08-22 (×9): 1 via ORAL

## 2014-08-17 MED ORDER — LEVOFLOXACIN 500 MG PO TABS
500.0000 mg | ORAL_TABLET | Freq: Every day | ORAL | Status: DC
  Administered 2014-08-17 – 2014-08-19 (×3): 500 mg via ORAL
  Filled 2014-08-17 (×3): qty 1

## 2014-08-17 NOTE — Progress Notes (Signed)
Manuel [name]   DOB:12/08/1940   DP#:[id number]   WER#:[id number]  Patient Care Team: Pcp Not In System as PCP - [place], MD as Referring Physician (Hematology and Oncology)   Consulting MD: Heath Lark, MD I have seen the patient, examined him and edited the notes as follows  Subjective:  Patient seen and examined. Fever not resolved, Tmax 100.9.  Feeling better today. He denies any respiratory or cardiac complaints. His abdominal pain is better controlled with current pain regimen. Appetite is better. He denies any nausea or vomiting, no diarrhea. Last bowel movement 2 days ago. He denies any bleeding issues such as epistaxis, hematemesis or hematuria. His shoulder pain is resolved. He denies any motor deficits. No confusion reported.    Scheduled Meds: . acetaminophen  650 mg Oral Once  . acyclovir  800 mg Oral BID  . albuterol  2.5 mg Nebulization BID  . cholecalciferol  2,000 Units Oral q morning - 10a  . famotidine  20 mg Oral BID  . ibrutinib  560 mg Oral Daily  . levothyroxine  75 mcg Oral QAC breakfast  . loratadine  10 mg Oral q morning - 10a  . pantoprazole  40 mg Oral Daily  . piperacillin-tazobactam (ZOSYN)  IV  3.375 g Intravenous Q8H  . predniSONE  60 mg Oral Q breakfast  . vancomycin  750 mg Intravenous Q12H   Continuous Infusions: . sodium chloride 75 mL/hr at 08/17/14 0605   PRN Meds:   Objective:  Filed Vitals:   08/17/14 0400  BP: 120/57  Pulse: 95  Temp:   Resp: 28      Intake/Output Summary (Last 24 hours) at 08/17/14 0747 Last data filed at 08/17/14 0600  Gross per 24 hour  Intake   3467 ml  Output    975 ml  Net   2492 ml    ECOG PERFORMANCE STATUS:2  GENERAL:alert, no distress, appears more comfortable SKIN: skin color is pale, texture, turgor are normal, no rashes or significant lesions EYES: normal, conjunctiva are pale and non-injected, sclera clear OROPHARYNX:no exudate, no erythema and lips, buccal mucosa, and  tongue normal  NECK: supple, thyroid normal size, non-tender, without nodularity LYMPH:  no palpable lymphadenopathy in the cervical, axillary or inguinal LUNGS: clear to auscultation and percussion with normal breathing effort HEART: regular rate & rhythm and no murmurs and no lower extremity edema ABDOMEN:abdomen soft, tenderness on the left upper quadrant. Enlarged, palpable spleen, 5 -6 cm below costal border. Normal bowel sounds Musculoskeletal:no cyanosis of digits and no clubbing  PSYCH: alert & oriented x 3 with fluent speech NEURO: no focal motor/sensory deficits    CBG (last 3)  No results for input(s): GLUCAP in the last 72 hours.   Labs:   Recent Labs Lab 08/16/14 0744 08/16/14 1507 08/17/14 0510  WBC 18.0* 19.1* 17.5*  HGB 7.1* 6.6* 7.0*  HCT 21.4* 19.2* 20.3*  PLT 80* 54* 45*  MCV 91.8 90.1 89.8  MCH 30.5 31.0 31.0  MCHC 33.2 34.4 34.5  RDW 14.3 14.5 14.9  LYMPHSABS 11.7*  --   --   MONOABS 0.5  --   --   EOSABS 0.0  --   --   BASOSABS 0.0  --   --      Chemistries:    Recent Labs Lab 08/16/14 0744 08/17/14 0510  NA 143 140  K 3.8 3.6  CL 112 111  CO2 20 20  GLUCOSE 141* 110*  BUN 21 21  CREATININE 1.52* 1.57*  CALCIUM 8.8 7.9*  AST 29  --   ALT 21  --   ALKPHOS 80  --   BILITOT 0.6  --     GFR Estimated Creatinine Clearance: 50.8 mL/min (by C-G formula based on Cr of 1.57).  Liver Function Tests:  Recent Labs Lab 08/16/14 0744  AST 29  ALT 21  ALKPHOS 80  BILITOT 0.6  PROT 5.7*  ALBUMIN 3.2*   No results for input(s): LIPASE, AMYLASE in the last 168 hours. No results for input(s): AMMONIA in the last 168 hours.  Urine Studies     Component Value Date/Time   COLORURINE AMBER* 08/16/2014 1010   APPEARANCEUR CLOUDY* 08/16/2014 1010   LABSPEC 1.025 08/16/2014 1010   PHURINE 5.0 08/16/2014 1010   GLUCOSEU NEGATIVE 08/16/2014 1010   HGBUR NEGATIVE 08/16/2014 1010   BILIRUBINUR SMALL* 08/16/2014 1010   KETONESUR NEGATIVE  08/16/2014 1010   PROTEINUR 30* 08/16/2014 1010   UROBILINOGEN 0.2 08/16/2014 1010   NITRITE NEGATIVE 08/16/2014 1010   LEUKOCYTESUR NEGATIVE 08/16/2014 1010    Microbiology Recent Results (from the past 240 hour(s))  TECHNOLOGIST REVIEW     Status: None   Collection Time: 08/07/14  3:36 PM  Result Value Ref Range Status   Technologist Review Many Variant lymphs with large nucleoli present  Final  MRSA PCR Screening     Status: None   Collection Time: 08/16/14  6:11 PM  Result Value Ref Range Status   MRSA by PCR NEGATIVE NEGATIVE Final    Comment:        The GeneXpert MRSA Assay (FDA approved for NASAL specimens only), is one component of a comprehensive MRSA colonization surveillance program. It is not intended to diagnose MRSA infection nor to guide or monitor treatment for MRSA infections.    Assessment/Plan: 74 y.o.   Mantle cell lymphoma Unfortunately, the patient have high disease burden.  Second line treatment for recurrent mantle cell lymphoma was discussed on 08/13/14. Intent is palliative.   Ibrutinib, a Bruton's tyrosine kinase inhibitor was to be initiated. While awaiting insurance approval, he was started on 60 mg of prednisone daily. He is now to receive ibrutinib in hospital.   Anemia in neoplastic disease This is likely anemia of chronic disease, splenomegaly, malignancy.  The patient denies recent history of bleeding such as epistaxis, hematuria or hematochezia.  Hb was 7.1 on admission, received 2 units of blood. Today, Hb is 7.0 Recommend 2 more units today Plan to keep hemoglobin >8g  Thrombocytopenia This, along with splenomegaly is highly suggestive of possible disease recurrence. The patient denies recent history of bleeding such as epistaxis, hematuria or hematochezia.  He is asymptomatic from the thrombocytopenia. Will  observe for now. Bactrim placed on hold since 1/11 This will drop while on Ibrutinib.  As long as his platelet count is  above 20,000, do not stop treatment  Abdominal pain This is due to splenomegaly in the setting of malignancy CT of the abdomen and pelvis on 1/8 showed significant increase in the size and volume of the spleen with development of a low-attenuation lesion in the posterior subcapsular spleen. Will monitor for now. Pain medication prn  Leukocytosis Fever This is due to Prednisone, inflammation, pain, malignancy, possible infection Cultures are pending He was placed on Zosyn and Vancomycin.  We discussed with hospitalist team, as Zosyn may interfere with counts. He will be switched to Levaquin and continue Vanco Will continue to monitor Ibrutinib that can also cause leukocytosis. This  consider stopping vancomycin if cultures are negative.  Bilateral shoulder pain This is likely related to referred pain. On oxycodone to take as needed.  Nausea with vomiting Suspect this is due to sensation of gastritis from his enlarged spleen causing increased reflux. Continue Prilosec and Zantac   Hypotension He appears weak. He is at risk for falls Continue IV fluids Can ambulate with assistance only  Malnutrition His appetite is improving since admission  Discharge planning Unlikely to be discharged until next week  Other medical issues as per admitting team   He will be followed by covering oncologist this weekend. I will resume care Monday  **Disclaimer: This note was dictated with voice recognition software. Similar sounding words can inadvertently be transcribed and this note may contain transcription errors which may not have been corrected upon publication of note.Sharene Butters E, PA-C 08/17/2014  7:47 AM Nichole Neyer, MD 08/17/2014

## 2014-08-17 NOTE — Care Management Note (Signed)
    Page 1 of 1   08/17/2014     11:28:17 AM CARE MANAGEMENT NOTE 08/17/2014  Patient:  Manuel [name], Manuel [name]   Account Number:  [id number]  Date Initiated:  08/17/2014  Documentation initiated by:  Dessa Phi  Subjective/Objective Assessment:   74 y/o m admitted w/Sepsis.DH:RCBULA cell Lymphoma.     Action/Plan:   From home.Active w/[facility] HHRN.   Anticipated DC Date:  08/21/2014   Anticipated DC Plan:  [place]  CM consult      [facility] Choice  Resumption Of Svcs/PTA Provider   Choice offered to / List presented to:             Status of service:  In process, will continue to follow Medicare Important Message given?   (If response is "NO", the following Medicare IM given date fields will be blank) Date Medicare IM given:   Medicare IM given by:   Date Additional Medicare IM given:   Additional Medicare IM given by:    Discharge Disposition:    Per UR Regulation:  Reviewed for med. necessity/level of care/duration of stay  If discussed at [place] of Stay Meetings, dates discussed:    Comments:  08/17/14 Dessa Phi RN BSN NCM [phone] Transfer from [place] active w/[facility] HHRN-Kristen aware & following.Await final [place] orders.[place] [phone] spoke to Tammy(nurse) informed to check into chemo resources for daughter, Pauline Aus will f/u & contact dtr. Continue to monitor progress for d/c needs.

## 2014-08-17 NOTE — Progress Notes (Signed)
[facility] PROGRESS NOTE  Manuel [name] BSJ:[id number] DOB: 1940/11/23 DOA: 08/16/2014 PCP: Pcp Not In System  Assessment/Plan: 1. Mantle cell lymphoma -Patient currently under the care of Dr. Alvy Bimler, has history of advanced/recurrent mantle cell lymphoma. Second line therapy was discussed with patient however goal of treatment would be palliative. -He remains on prednisone 60 mg by mouth daily, with oncology recommending ibrutinib during this hospitalization. -Plan to transfuse with 2 units of packed red blood cells today -Continue supportive care  2.  Acute respiratory failure -Evidence by a respiratory rate of 31, patient presenting respiratory distress, having an increased oxygen requirement -To be related to an underlying infectious process although initial chest x-ray did not show evidence of infiltrate -Continue empiric antibiotic therapy -Supplemental oxygen as needed -Repeat chest x-ray in a.m.  3.  Possible healthcare associated pneumonia -Patient presenting with sepsis, having complaints of shortness of breath and chest pain -Initial chest x-ray did not show evidence of pneumonia, plan to repeat chest x-ray in a.m., meanwhile continue empiric antibiotic therapy  4.  Sepsis -Present on admission, evidenced by a white count of 19,000, respiratory rate of 31, lactic acid of 2.54 -Suspect source of infection to be pneumonia given patient's complaints of shortness of breath -Continue empiric IV antimicrobial therapy, follow-up on cultures  5.  Anemia in neoplastic disease -Patient does not appear to have evidence of GI bleed -Case discussed with oncology who recommended transfusing 2 units packed red blood cells at this time -Repeat labs in a.m.  6.  Thrombocytopenia -A.m. 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 Patient also found to be in respiratory failure having a low-grade temperature. Initial workup included a chest x-ray which did not reveal acute cardiopulmonary disease. Labs did reveal an elevated white count of 19,100 with lactic acid of 2.54. Patient having a history of malignancy and on chronic immunosuppressive therapy having a high risk for infection. He will started on broad-spectrum IV antimicrobial therapy with Zosyn and vancomycin. On the following morning his diet was advanced. I spoke with oncology who recommended transfusing 2 units of packed red blood cells and stopping IV vancomycin. Labs showing hemoglobin of 7.0.  Objective: Filed Vitals:   08/17/14 0800  BP: 139/62  Pulse: 102  Temp:   Resp: 23    Intake/Output Summary (Last 24 hours) at 08/17/14 0834 Last data filed at 08/17/14 0831  Gross per 24 hour  Intake   3767 ml  Output    975 ml  Net   2792 ml   Filed Weights   08/16/14 2000  Weight: 92.4 kg (203 lb 11.3 oz)    Exam:   General:  Ill-appearing, awake, alert, following commands  Cardiovascular: Tachycardic, regular rate and rhythm normal S1-S2  Respiratory: Tachypneic, lungs appear to be clear auscultation, no wheezes or rales  Abdomen: Soft nontender nondistended  Musculoskeletal: no edema  Data Reviewed: Basic Metabolic Panel:  Recent Labs Lab 08/16/14 0744 08/17/14 0510  NA 143 140  K 3.8 3.6  CL 112 111  CO2  20 20  GLUCOSE 141* 110*  BUN 21 21  CREATININE 1.52* 1.57*  CALCIUM 8.8 7.9*   Liver Function Tests:  Recent Labs Lab 08/16/14 0744  AST 29  ALT 21   ALKPHOS 80  BILITOT 0.6  PROT 5.7*  ALBUMIN 3.2*   No results for input(s): LIPASE, AMYLASE in the last 168 hours. No results for input(s): AMMONIA in the last 168 hours. CBC:  Recent Labs Lab 08/16/14 0744 08/16/14 1507 08/17/14 0510  WBC 18.0* 19.1* 17.5*  NEUTROABS 5.8  --   --   HGB 7.1* 6.6* 7.0*  HCT 21.4* 19.2* 20.3*  MCV 91.8 90.1 89.8  PLT 80* 54* 45*   Cardiac Enzymes: No results for input(s): CKTOTAL, CKMB, CKMBINDEX, TROPONINI in the last 168 hours. BNP (last 3 results) No results for input(s): PROBNP in the last 8760 hours. CBG: No results for input(s): GLUCAP in the last 168 hours.  Recent Results (from the past 240 hour(s))  TECHNOLOGIST REVIEW     Status: None   Collection Time: 08/07/14  3:36 PM  Result Value Ref Range Status   Technologist Review Many Variant lymphs with large nucleoli present  Final  MRSA PCR Screening     Status: None   Collection Time: 08/16/14  6:11 PM  Result Value Ref Range Status   MRSA by PCR NEGATIVE NEGATIVE Final    Comment:        The GeneXpert MRSA Assay (FDA approved for NASAL specimens only), is one component of a comprehensive MRSA colonization surveillance program. It is not intended to diagnose MRSA infection nor to guide or monitor treatment for MRSA infections.      Studies: Dg Chest Port 1 View  08/16/2014   CLINICAL DATA:  Acute onset shortness of breath and hypoxemia earlier today. Current history of mantle cell lymphoma.  EXAM: PORTABLE CHEST - 1 VIEW  COMPARISON:  Two-view chest x-ray 01/28/2014 and portable chest x-ray 09/12/2013.  FINDINGS: Cardiac silhouette normal in size, unchanged. Lungs clear. Bronchovascular markings normal. Pulmonary vascularity normal. No visible pleural effusions. No pneumothorax. Right jugular Port-A-Cath tip projects over the upper SVC, unchanged.  IMPRESSION: No acute cardiopulmonary disease.   Electronically Signed   By: Evangeline Dakin M.D.   On: 08/16/2014 08:07   Dg  Abd Portable 1v  08/16/2014   CLINICAL DATA:  Acute onset mid abdominal pain and diarrhea earlier today. Current history of mantle cell lymphoma.  EXAM: PORTABLE ABDOMEN - 1 VIEW  COMPARISON:  CT abdomen and pelvis 08/10/2014 and PET-CT 07/13/2014, 01/24/2014, 09/16/2013.  FINDINGS: Bowel gas pattern unremarkable without evidence of obstruction or significant ileus. Expected stool burden in the colon. Apparent calcification overlying the right upper quadrant was not present on the CT 6 days ago and therefore may be artifactual an external to the patient. Massive splenic enlargement as noted on the CT. Large phleboliths low in the left side of the pelvis. No visible opaque urinary tract calculi. Prior L4-5 and L5-S1 posterior decompression and fusion with degenerative changes in the lower lumbar spine and in the left hip.  IMPRESSION: 1. No acute abdominal abnormality. Apparent calcification overlying the right upper quadrant was not present on the CT 6 days ago and therefore is likely artifactual. 2. Massive splenomegaly as noted on the recent CT.   Electronically Signed   By: Evangeline Dakin M.D.   On: 08/16/2014 08:14    Scheduled Meds: . acetaminophen  650 mg Oral Once  . acyclovir  800 mg Oral BID  .  albuterol  2.5 mg Nebulization BID  . cholecalciferol  2,000 Units Oral q morning - 10a  . famotidine  20 mg Oral BID  . ibrutinib  560 mg Oral Daily  . levofloxacin  500 mg Oral Daily  . levothyroxine  75 mcg Oral QAC breakfast  . loratadine  10 mg Oral q morning - 10a  . pantoprazole  40 mg Oral Daily  . predniSONE  60 mg Oral Q breakfast  . vancomycin  750 mg Intravenous Q12H   Continuous Infusions: . sodium chloride 75 mL/hr at 08/17/14 0605    Principal Problem:   Sepsis Active Problems:   Mantle cell lymphoma   Hypothyroidism   Anemia in neoplastic disease   Thrombocytopenia   S/P autologous bone marrow transplantation   Dyspnea   Acute respiratory failure with hypoxia   Acute  renal failure    Time spent: 35 min    Manuel [name]  [facility] Pager [phone]. If 7PM-7AM, please contact night-coverage at [email or web address], password [email or web address] 08/17/2014, 8:34 AM  LOS: 1 day

## 2014-08-17 NOTE — Progress Notes (Signed)
Transferred to room 1444, report called to Winfred Burn , daughter notified of transfer to tele. bed

## 2014-08-17 NOTE — Progress Notes (Signed)
Notified Dr. Alvy Bimler of pltc 45K, received orders to proceed with ibrutinib as ordered.  Clayburn Pert, PharmD, BCPS Pager: [phone] 08/17/2014  7:24 AM

## 2014-08-17 NOTE — Progress Notes (Signed)
INITIAL NUTRITION ASSESSMENT  DOCUMENTATION CODES Per approved criteria  -Not Applicable   INTERVENTION: -Resource Breeze po TID, each supplement provides 250 kcal and 9 grams of protein  NUTRITION DIAGNOSIS: Inadequate oral intake related to dyspnea and chest pain as evidenced by poor po intake.   Goal: Pt to meet >/= 90% of their estimated nutrition needs   Monitor:  Weight trend, po intake, acceptance of supplements, labs  Reason for Assessment: MST  74 y.o. male  Admitting Dx: Sepsis  ASSESSMENT: 74 yo male with mantle cell lymphoma, s/p stem cell transplant in Sept 2015, currently on Prednisone 60 mg tablet daily, under the care of Dr. Alvy Bimler, presented to [facility] ED with main concern of several days duration of progressively worsening dyspnea initially present with exertion but now present at rest as well over the past 24 hours. This has been associated with anterior area chest pain radiating to bilateral shoulders, intermittent in nature and lasting several minutes at the time, 7/10 in severity when present, no specific alleviating factors, no similar events in the past.  - Pt transferred to room 1444. - Pt with poor appetite.  - Pt with minor weight loss recently. Per RN, pt has not been eating well. For breakfast this morning he has a small amount of oatmeal and fruit.  - Pt reports usually body weight as 202 lb.  - Does not like Ensure or Boost. He says it is too chalky. Agreed to try [employer].  - Pt with moderate wasting at the temples. Other areas WNL  Labs: Na and K WNL CBGs: 96-103  Height: Ht Readings from Last 1 Encounters:  08/16/14 6\' 3"  (1.905 m)    Weight: Wt Readings from Last 1 Encounters:  08/16/14 203 lb 11.3 oz (92.4 kg)    Ideal Body Weight: 84.5 kg  % Ideal Body Weight: 109%  Wt Readings from Last 10 Encounters:  08/16/14 203 lb 11.3 oz (92.4 kg)  08/13/14 199 lb 6.4 oz (90.447 kg)  08/07/14 201 lb 1.6 oz (91.218 kg)  06/07/14 200  lb 6.4 oz (90.901 kg)  05/03/14 199 lb 14.4 oz (90.674 kg)  04/30/14 196 lb 11.2 oz (89.223 kg)  01/28/14 200 lb (90.719 kg)  01/25/14 203 lb 12.8 oz (92.443 kg)  12/27/13 201 lb 12.8 oz (91.536 kg)  11/29/13 195 lb 3.2 oz (88.542 kg)    Usual Body Weight: 202 lbs  % Usual Body Weight: 100%  BMI:  Body mass index is 25.46 kg/(m^2).  Estimated Nutritional Needs: Kcal: 2300-2500 Protein: 130-140 g Fluid: 2.3-2.5 L/day  Skin: Intact  Diet Order: Diet regular  EDUCATION NEEDS: -Education needs addressed   Intake/Output Summary (Last 24 hours) at 08/17/14 1104 Last data filed at 08/17/14 0831  Gross per 24 hour  Intake   2932 ml  Output    975 ml  Net   1957 ml    Last BM: prior to admission   Labs:   Recent Labs Lab 08/16/14 0744 08/17/14 0510  NA 143 140  K 3.8 3.6  CL 112 111  CO2 20 20  BUN 21 21  CREATININE 1.52* 1.57*  CALCIUM 8.8 7.9*  GLUCOSE 141* 110*    CBG (last 3)  No results for input(s): GLUCAP in the last 72 hours.  Scheduled Meds: . acetaminophen  650 mg Oral Once  . acyclovir  800 mg Oral BID  . albuterol  2.5 mg Nebulization BID  . cholecalciferol  2,000 Units Oral q morning - 10a  .  famotidine  20 mg Oral BID  . ibrutinib  560 mg Oral Daily  . levofloxacin  500 mg Oral Daily  . levothyroxine  75 mcg Oral QAC breakfast  . loratadine  10 mg Oral q morning - 10a  . pantoprazole  40 mg Oral Daily  . predniSONE  60 mg Oral Q breakfast  . vancomycin  750 mg Intravenous Q12H    Continuous Infusions: . sodium chloride 10 mL/hr (08/17/14 0945)    Past Medical History  Diagnosis Date  . Thyroid disease   . Hyperlipemia   . Liver disease   . 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 Malnutrition 08/30/2013  . Hypothyroidism   . Depression     Spouse passed 5 months ago  . Gout 09/28/2013    Past Surgical History  Procedure Laterality Date  . Back surgery      1985 and 1986  . Axillary lymph  node biopsy Right 09/12/2013    Procedure: RIGHT AXILLARY LYMPH NODE BIOPSY;  Surgeon: Marcello Moores A. Cornett, MD;  Location: [place];  Service: General;  Laterality: Right;  . Portacath placement Right 09/12/2013    Procedure: INSERTION PORT-A-CATH;  Surgeon: Joyice Faster. Cornett, MD;  Location: [place];  Service: General;  Laterality: Right;    Laurette Schimke MS, RD, LDN

## 2014-08-18 ENCOUNTER — Inpatient Hospital Stay (HOSPITAL_COMMUNITY): Payer: Medicare PPO

## 2014-08-18 DIAGNOSIS — Z8572 Personal history of non-Hodgkin lymphomas: Secondary | ICD-10-CM | POA: Insufficient documentation

## 2014-08-18 DIAGNOSIS — D649 Anemia, unspecified: Secondary | ICD-10-CM

## 2014-08-18 LAB — TYPE AND SCREEN
ABO/RH(D): O POS
Antibody Screen: NEGATIVE
UNIT DIVISION: 0
Unit division: 0
Unit division: 0
Unit division: 0

## 2014-08-18 LAB — BASIC METABOLIC PANEL
ANION GAP: 9 (ref 5–15)
BUN: 21 mg/dL (ref 6–23)
CALCIUM: 8.2 mg/dL — AB (ref 8.4–10.5)
CHLORIDE: 108 meq/L (ref 96–112)
CO2: 21 mmol/L (ref 19–32)
Creatinine, Ser: 1.26 mg/dL (ref 0.50–1.35)
GFR calc non Af Amer: 55 mL/min — ABNORMAL LOW (ref >90)
GFR, EST AFRICAN AMERICAN: 64 mL/min — AB (ref >90)
Glucose, Bld: 174 mg/dL — ABNORMAL HIGH (ref 70–99)
Potassium: 4.1 mmol/L (ref 3.5–5.1)
Sodium: 138 mmol/L (ref 135–145)

## 2014-08-18 LAB — CBC
HCT: 26.4 % — ABNORMAL LOW (ref 39.0–52.0)
Hemoglobin: 9.1 g/dL — ABNORMAL LOW (ref 13.0–17.0)
MCH: 31 pg (ref 26.0–34.0)
MCHC: 34.5 g/dL (ref 30.0–36.0)
MCV: 89.8 fL (ref 78.0–100.0)
Platelets: 36 10*3/uL — ABNORMAL LOW (ref 150–400)
RBC: 2.94 MIL/uL — AB (ref 4.22–5.81)
RDW: 14.9 % (ref 11.5–15.5)
WBC: 21.5 10*3/uL — ABNORMAL HIGH (ref 4.0–10.5)

## 2014-08-18 NOTE — Progress Notes (Signed)
[facility] PROGRESS NOTE  Manuel [name] HAL:[id number] DOB: 04-Apr-1941 DOA: 08/16/2014 PCP: Pcp Not In System  Assessment/Plan: 1. Mantle cell lymphoma -Patient currently under the care of Dr. Alvy Bimler, has history of advanced/recurrent mantle cell lymphoma. Second line therapy was discussed with patient however goal of treatment would be palliative. -He remains on prednisone 60 mg by mouth daily, with oncology recommending ibrutinib during this hospitalization. -Hg stable  2.  Acute respiratory failure -Evidence by a respiratory rate of 31, patient presenting respiratory distress, having an increased oxygen requirement -To be related to an underlying infectious process although initial chest x-ray did not show evidence of infiltrate -Continue empiric antibiotic therapy -Supplemental oxygen as needed -Repeat chest x-ray showing increased atelectasis vs PNA, clinically however he seems improved, afebrile, satting upper 90's on 2L supplemental oxygen -Will discontinue IV Vancomycin, continue oral Levaquin  3.  Possible healthcare associated pneumonia -Clinically improved, hemodynamically stable, afebrile, nontoxic, tolerating PO -Will stop IV Vancomycin -Continue oral Levaquin  4.  Sepsis -Present on admission, evidenced by a white count of 19,000, respiratory rate of 31, lactic acid of 2.54 -Suspect source of infection to be pneumonia given patient's complaints of shortness of breath -Improved  5.  Anemia in neoplastic disease -Patient does not appear to have evidence of GI bleed -Case discussed with oncology who recommended transfusing 2 units packed red blood cells at this time -Repeat labs showing Hg of 9.1  6.  Thrombocytopenia -A.m. 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 Patient also found to be in respiratory failure having a low-grade temperature. Initial workup included a chest x-ray which did not reveal acute cardiopulmonary disease. Labs did reveal an elevated white count of 19,100 with lactic acid of 2.54. Patient having a history of malignancy and on chronic immunosuppressive therapy having a high risk for infection. He will started on broad-spectrum IV antimicrobial therapy with Zosyn and vancomycin. On the following morning his diet was advanced. I spoke with oncology who recommended transfusing 2 units of packed red blood cells and stopping IV vancomycin. Labs showing hemoglobin of 7.0.  Objective: Filed Vitals:   08/18/14 0547  BP: 122/77  Pulse: 61  Temp: 97.7 F (36.5 C)  Resp: 19    Intake/Output Summary (Last 24 hours) at 08/18/14 1421 Last data filed at 08/18/14 1300  Gross per 24 hour  Intake 1362.5 ml  Output   1251 ml  Net  111.5 ml   Filed Weights   08/16/14 2000 08/17/14 1123  Weight: 92.4 kg (203 lb 11.3 oz) 93.305 kg (205 lb 11.2 oz)    Exam:   General: Patient is awake and alert, following commands, nontoxic appearing  Cardiovascular: Tachycardic, regular rate and rhythm normal S1-S2  Respiratory: Tachypneic, lungs appear to be clear auscultation, no wheezes or rales  Abdomen: Soft nontender nondistended  Musculoskeletal: no edema  Data Reviewed: Basic Metabolic Panel:  Recent Labs Lab 08/16/14 0744 08/17/14 0510 08/18/14 0500  NA 143 140 138  K 3.8 3.6 4.1  CL 112  111 108  CO2 20 20 21   GLUCOSE 141* 110* 174*  BUN 21 21 21   CREATININE 1.52* 1.57*  1.26  CALCIUM 8.8 7.9* 8.2*   Liver Function Tests:  Recent Labs Lab 08/16/14 0744  AST 29  ALT 21  ALKPHOS 80  BILITOT 0.6  PROT 5.7*  ALBUMIN 3.2*   No results for input(s): LIPASE, AMYLASE in the last 168 hours. No results for input(s): AMMONIA in the last 168 hours. CBC:  Recent Labs Lab 08/16/14 0744 08/16/14 1507 08/17/14 0510 08/18/14 0500  WBC 18.0* 19.1* 17.5* 21.5*  NEUTROABS 5.8  --   --   --   HGB 7.1* 6.6* 7.0* 9.1*  HCT 21.4* 19.2* 20.3* 26.4*  MCV 91.8 90.1 89.8 89.8  PLT 80* 54* 45* 36*   Cardiac Enzymes: No results for input(s): CKTOTAL, CKMB, CKMBINDEX, TROPONINI in the last 168 hours. BNP (last 3 results) No results for input(s): PROBNP in the last 8760 hours. CBG: No results for input(s): GLUCAP in the last 168 hours.  Recent Results (from the past 240 hour(s))  Blood Culture (routine x 2)     Status: None (Preliminary result)   Collection Time: 08/16/14  7:44 AM  Result Value Ref Range Status   Specimen Description BLOOD RIGHT ANTECUBITAL  Final   Special Requests BOTTLES DRAWN AEROBIC AND ANAEROBIC 4CC  Final   Culture   Final           BLOOD CULTURE RECEIVED NO GROWTH TO DATE CULTURE WILL BE HELD FOR 5 DAYS BEFORE ISSUING A FINAL NEGATIVE REPORT Performed at [facility]    Report Status PENDING  Incomplete  Blood Culture (routine x 2)     Status: None (Preliminary result)   Collection Time: 08/16/14  7:44 AM  Result Value Ref Range Status   Specimen Description BLOOD LEFT FOREARM  Final   Special Requests BOTTLES DRAWN AEROBIC ONLY 2ML  Final   Culture   Final           BLOOD CULTURE RECEIVED NO GROWTH TO DATE CULTURE WILL BE HELD FOR 5 DAYS BEFORE ISSUING A FINAL NEGATIVE REPORT Performed at [facility]    Report Status PENDING  Incomplete  Urine culture     Status: None   Collection Time: 08/16/14 10:10 AM  Result Value Ref Range Status   Specimen Description URINE, CATHETERIZED  Final   Special Requests NONE   Final   Colony Count NO GROWTH Performed at [facility]   Final   Culture NO GROWTH Performed at [facility]   Final   Report Status 08/17/2014 FINAL  Final  Culture, blood (routine x 2)     Status: None (Preliminary result)   Collection Time: 08/16/14  3:07 PM  Result Value Ref Range Status   Specimen Description BLOOD RIGHT ARM  Final   Special Requests BOTTLES DRAWN AEROBIC AND ANAEROBIC 5ML  Final   Culture   Final           BLOOD CULTURE RECEIVED NO GROWTH TO DATE CULTURE WILL BE HELD FOR 5 DAYS BEFORE ISSUING A FINAL NEGATIVE REPORT Performed at [facility]    Report Status PENDING  Incomplete  Culture, blood (routine x 2)     Status: None (Preliminary result)   Collection Time: 08/16/14  3:08 PM  Result Value Ref Range Status   Specimen Description BLOOD LEFT HAND  Final   Special Requests BOTTLES DRAWN AEROBIC AND ANAEROBIC 4ML AND 3ML  Final   Culture   Final           BLOOD CULTURE RECEIVED NO GROWTH TO DATE CULTURE WILL BE HELD FOR 5 DAYS BEFORE ISSUING A FINAL NEGATIVE REPORT Performed at [facility]    Report Status PENDING  Incomplete  MRSA PCR Screening     Status: None   Collection Time: 08/16/14  6:11 PM  Result Value Ref Range Status   MRSA by PCR NEGATIVE NEGATIVE Final    Comment:        The GeneXpert MRSA Assay (FDA approved for NASAL specimens only), is one component of a comprehensive MRSA colonization surveillance program. It is not intended to diagnose MRSA infection nor to guide or monitor treatment for MRSA infections.      Studies: Dg Chest 2 View  08/18/2014   CLINICAL DATA:  Healthcare associated pneumonia. Sepsis. Mantle cell lymphoma.  EXAM: CHEST  2 VIEW  COMPARISON:  08/16/2014  FINDINGS: Increased linear opacity is seen in the right infrahilar region. Increased streaky opacity also seen in left retrocardiac lung base. No evidence of pleural effusion. Heart size is normal. Right-sided Port-A-Cath  remains in place.  IMPRESSION: Increased bibasilar atelectasis versus infiltrates.   Electronically Signed   By: Earle Gell M.D.   On: 08/18/2014 12:08    Scheduled Meds: . acetaminophen  650 mg Oral Once  . acyclovir  800 mg Oral BID  . albuterol  2.5 mg Nebulization BID  . cholecalciferol  2,000 Units Oral q morning - 10a  . famotidine  20 mg Oral BID  . feeding supplement (RESOURCE BREEZE)  1 Container Oral TID BM  . ibrutinib  560 mg Oral Daily  . levofloxacin  500 mg Oral Daily  . levothyroxine  75 mcg Oral QAC breakfast  . loratadine  10 mg Oral q morning - 10a  . pantoprazole  40 mg Oral Daily  . predniSONE  60 mg Oral Q breakfast  . vancomycin  750 mg Intravenous Q12H   Continuous Infusions: . sodium chloride 10 mL/hr (08/17/14 0945)    Principal Problem:   Sepsis Active Problems:   Mantle cell lymphoma   Hypothyroidism   Anemia in neoplastic disease   Thrombocytopenia   S/P autologous bone marrow transplantation   Dyspnea   Acute respiratory failure with hypoxia   Acute renal failure    Time spent: 25 min    Kelvin Cellar  [facility] Pager [phone]. If 7PM-7AM, please contact night-coverage at [email or web address], password [email or web address] 08/18/2014, 2:21 PM  LOS: 2 days

## 2014-08-18 NOTE — Progress Notes (Signed)
SUBJECTIVE: Breathing is stable, started Ibrutinib 08/16/2014 and appears to be tolerating it quite well.  OBJECTIVE PHYSICAL EXAMINATION: ECOG PERFORMANCE STATUS: 2 - Symptomatic, <50% confined to bed  Filed Vitals:   08/18/14 0547  BP: 122/77  Pulse: 61  Temp: 97.7 F (36.5 C)  Resp: 19   Filed Weights   08/16/14 2000 08/17/14 1123  Weight: 203 lb 11.3 oz (92.4 kg) 205 lb 11.2 oz (93.305 kg)    GENERAL:alert, no distress and comfortable OROPHARYNX:no exudate, no erythema and lips, buccal mucosa, and tongue normal  NECK: supple LUNGS: clear to auscultation and percussion with normal breathing effort HEART: regular rate & rhythm and no murmurs and no lower extremity edema ABDOMEN: Splenomegaly palpable at least 5 fingerbreadths below the left posterior margin Musculoskeletal:no cyanosis of digits and no clubbing  NEURO: alert & oriented x 3 with fluent speech, no focal motor/sensory deficits  LABORATORY DATA:  I have reviewed the data as listed CMP Latest Ref Rng 08/18/2014 08/17/2014 08/16/2014  Glucose 70 - 99 mg/dL 174(H) 110(H) 141(H)  BUN 6 - 23 mg/dL 21 21 21   Creatinine 0.50 - 1.35 mg/dL 1.26 1.57(H) 1.52(H)  Sodium 135 - 145 mmol/L 138 140 143  Potassium 3.5 - 5.1 mmol/L 4.1 3.6 3.8  Chloride 96 - 112 mEq/L 108 111 112  CO2 19 - 32 mmol/L 21 20 20   Calcium 8.4 - 10.5 mg/dL 8.2(L) 7.9(L) 8.8  Total Protein 6.0 - 8.3 g/dL - - 5.7(L)  Total Bilirubin 0.3 - 1.2 mg/dL - - 0.6  Alkaline Phos 39 - 117 U/L - - 80  AST 0 - 37 U/L - - 29  ALT 0 - 53 U/L - - 21      Lab Results  Component Value Date   WBC 21.5* 08/18/2014   HGB 9.1* 08/18/2014   HCT 26.4* 08/18/2014   MCV 89.8 08/18/2014   PLT 36* 08/18/2014   NEUTROABS 5.8 08/16/2014    ASSESSMENT AND PLAN: 1. Mantle cell lymphoma: Status post metallic stems of transplant now on Ibrutinib since 2 days oral therapy. 2. Anemia: Status post blood transfusion of 2 units. Today's hemoglobin is 9.1. 3.  Thrombocytopenia: No evidence of any bruising or bleeding. No indication for transfusion. 4. Abdominal pain related to splenomegaly. Dr. Alvy Bimler to follow-up on Monday.

## 2014-08-19 ENCOUNTER — Inpatient Hospital Stay (HOSPITAL_COMMUNITY): Payer: Medicare PPO

## 2014-08-19 DIAGNOSIS — J189 Pneumonia, unspecified organism: Secondary | ICD-10-CM

## 2014-08-19 LAB — CBC WITH DIFFERENTIAL/PLATELET
BASOS PCT: 0 % (ref 0–1)
Band Neutrophils: 0 % (ref 0–10)
Basophils Absolute: 0 10*3/uL (ref 0.0–0.1)
Blasts: 0 %
Eosinophils Absolute: 0 10*3/uL (ref 0.0–0.7)
Eosinophils Relative: 0 % (ref 0–5)
HEMATOCRIT: 28.1 % — AB (ref 39.0–52.0)
Hemoglobin: 9.4 g/dL — ABNORMAL LOW (ref 13.0–17.0)
Lymphocytes Relative: 77 % — ABNORMAL HIGH (ref 12–46)
Lymphs Abs: 21 10*3/uL — ABNORMAL HIGH (ref 0.7–4.0)
MCH: 30.6 pg (ref 26.0–34.0)
MCHC: 33.5 g/dL (ref 30.0–36.0)
MCV: 91.5 fL (ref 78.0–100.0)
MONOS PCT: 3 % (ref 3–12)
MYELOCYTES: 0 %
Metamyelocytes Relative: 0 %
Monocytes Absolute: 0.8 10*3/uL (ref 0.1–1.0)
NEUTROS ABS: 5.4 10*3/uL (ref 1.7–7.7)
NEUTROS PCT: 20 % — AB (ref 43–77)
PROMYELOCYTES ABS: 0 %
Platelets: 46 10*3/uL — ABNORMAL LOW (ref 150–400)
RBC: 3.07 MIL/uL — AB (ref 4.22–5.81)
RDW: 15.5 % (ref 11.5–15.5)
WBC: 27.2 10*3/uL — ABNORMAL HIGH (ref 4.0–10.5)
nRBC: 0 /100 WBC

## 2014-08-19 MED ORDER — VANCOMYCIN HCL 10 G IV SOLR
1250.0000 mg | Freq: Two times a day (BID) | INTRAVENOUS | Status: DC
  Administered 2014-08-19 – 2014-08-20 (×2): 1250 mg via INTRAVENOUS
  Filled 2014-08-19 (×3): qty 1250

## 2014-08-19 MED ORDER — DEXTROSE 5 % IV SOLN
2.0000 g | Freq: Two times a day (BID) | INTRAVENOUS | Status: DC
  Administered 2014-08-19 – 2014-08-22 (×6): 2 g via INTRAVENOUS
  Filled 2014-08-19 (×7): qty 2

## 2014-08-19 MED ORDER — HYDRALAZINE HCL 20 MG/ML IJ SOLN
10.0000 mg | Freq: Four times a day (QID) | INTRAMUSCULAR | Status: DC | PRN
  Administered 2014-08-19 – 2014-08-20 (×2): 10 mg via INTRAVENOUS
  Filled 2014-08-19 (×2): qty 1

## 2014-08-19 NOTE — Progress Notes (Signed)
ANTIBIOTIC CONSULT NOTE - INITIAL  Pharmacy Consult for Vanc and Cefepime Indication: rule out sepsis, HCAP  No Known Allergies  Patient Measurements: Height: 6\' 3"  (190.5 cm) Weight: 205 lb 11.2 oz (93.305 kg) IBW/kg (Calculated) : 84.5  Vital Signs: Temp: 97.6 F (36.4 C) (01/17 1345) Temp Source: Oral (01/17 1345) BP: 158/83 mmHg (01/17 1345) Pulse Rate: 64 (01/17 1345) Intake/Output from previous day: 01/16 0701 - 01/17 0700 In: 480 [P.O.:480] Out: 352 [Urine:350; Stool:2] Intake/Output from this shift: Total I/O In: 720 [P.O.:720] Out: 425 [Urine:425]  Labs:  Recent Labs  08/17/14 0510 08/18/14 0500 08/19/14 0549  WBC 17.5* 21.5* 27.2*  HGB 7.0* 9.1* 9.4*  PLT 45* 36* 46*  CREATININE 1.57* 1.26  --    Estimated Creatinine Clearance: 63.3 mL/min (by C-G formula based on Cr of 1.26). No results for input(s): VANCOTROUGH, VANCOPEAK, VANCORANDOM, GENTTROUGH, GENTPEAK, GENTRANDOM, TOBRATROUGH, TOBRAPEAK, TOBRARND, AMIKACINPEAK, AMIKACINTROU, AMIKACIN in the last 72 hours.   Microbiology: Recent Results (from the past 720 hour(s))  TECHNOLOGIST REVIEW     Status: None   Collection Time: 08/07/14  3:36 PM  Result Value Ref Range Status   Technologist Review Many Variant lymphs with large nucleoli present  Final  Blood Culture (routine x 2)     Status: None (Preliminary result)   Collection Time: 08/16/14  7:44 AM  Result Value Ref Range Status   Specimen Description BLOOD RIGHT ANTECUBITAL  Final   Special Requests BOTTLES DRAWN AEROBIC AND ANAEROBIC 4CC  Final   Culture   Final           BLOOD CULTURE RECEIVED NO GROWTH TO DATE CULTURE WILL BE HELD FOR 5 DAYS BEFORE ISSUING A FINAL NEGATIVE REPORT Performed at [facility]    Report Status PENDING  Incomplete  Blood Culture (routine x 2)     Status: None (Preliminary result)   Collection Time: 08/16/14  7:44 AM  Result Value Ref Range Status   Specimen Description BLOOD LEFT FOREARM  Final   Special Requests BOTTLES DRAWN AEROBIC ONLY 2ML  Final   Culture   Final           BLOOD CULTURE RECEIVED NO GROWTH TO DATE CULTURE WILL BE HELD FOR 5 DAYS BEFORE ISSUING A FINAL NEGATIVE REPORT Performed at [facility]    Report Status PENDING  Incomplete  Urine culture     Status: None   Collection Time: 08/16/14 10:10 AM  Result Value Ref Range Status   Specimen Description URINE, CATHETERIZED  Final   Special Requests NONE  Final   Colony Count NO GROWTH Performed at [facility]   Final   Culture NO GROWTH Performed at [facility]   Final   Report Status 08/17/2014 FINAL  Final  Culture, blood (routine x 2)     Status: None (Preliminary result)   Collection Time: 08/16/14  3:07 PM  Result Value Ref Range Status   Specimen Description BLOOD RIGHT ARM  Final   Special Requests BOTTLES DRAWN AEROBIC AND ANAEROBIC 5ML  Final   Culture   Final           BLOOD CULTURE RECEIVED NO GROWTH TO DATE CULTURE WILL BE HELD FOR 5 DAYS BEFORE ISSUING A FINAL NEGATIVE REPORT Performed at [facility]    Report Status PENDING  Incomplete  Culture, blood (routine x 2)     Status: None (Preliminary result)   Collection Time: 08/16/14  3:08 PM  Result Value Ref  Range Status   Specimen Description BLOOD LEFT HAND  Final   Special Requests BOTTLES DRAWN AEROBIC AND ANAEROBIC 4ML AND 3ML  Final   Culture   Final           BLOOD CULTURE RECEIVED NO GROWTH TO DATE CULTURE WILL BE HELD FOR 5 DAYS BEFORE ISSUING A FINAL NEGATIVE REPORT Performed at [facility]    Report Status PENDING  Incomplete  MRSA PCR Screening     Status: None   Collection Time: 08/16/14  6:11 PM  Result Value Ref Range Status   MRSA by PCR NEGATIVE NEGATIVE Final    Comment:        The GeneXpert MRSA Assay (FDA approved for NASAL specimens only), is one component of a comprehensive MRSA colonization surveillance program. It is not intended to diagnose MRSA infection nor to  guide or monitor treatment for MRSA infections.     Medical History: Past Medical History  Diagnosis Date  . Thyroid disease   . Hyperlipemia   . Liver disease   . 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 Malnutrition 08/30/2013  . Hypothyroidism   . Depression     Spouse passed 5 months ago  . Gout 09/28/2013    Medications:  Prescriptions prior to admission  Medication Sig Dispense Refill Last Dose  . acetaminophen (TYLENOL) 500 MG tablet Take 500 mg by mouth every 6 (six) hours as needed for moderate pain or fever.    Past Week at Unknown time  . acyclovir (ZOVIRAX) 800 MG tablet Take 800 mg by mouth 2 (two) times daily.    08/15/2014 at Unknown time  . cholecalciferol (VITAMIN D) 1000 UNITS tablet Take 2,000 Units by mouth every morning.   08/15/2014 at Unknown time  . levothyroxine (SYNTHROID, LEVOTHROID) 75 MCG tablet Take 1 tablet (75 mcg total) by mouth daily. 30 tablet 6 08/15/2014 at Unknown time  . loratadine (CLARITIN) 10 MG tablet Take 10 mg by mouth every morning.   08/15/2014 at Unknown time  . Multiple Vitamin tablet Take 1 tablet by mouth.   08/15/2014 at Unknown time  . omeprazole (PRILOSEC) 40 MG capsule Take 1 capsule (40 mg total) by mouth daily. 90 capsule 3 08/15/2014 at Unknown time  . oxyCODONE (OXY IR/ROXICODONE) 5 MG immediate release tablet Take 1 tablet (5 mg total) by mouth every 4 (four) hours as needed for severe pain. 60 tablet 0 unknown  . polyethylene glycol (MIRALAX / GLYCOLAX) packet Take 17 g by mouth daily as needed.   unknown  . predniSONE (DELTASONE) 20 MG tablet Take 3 tablets (60 mg total) by mouth daily with breakfast. 90 tablet 4 08/15/2014 at Unknown time  . ranitidine (ZANTAC) 150 MG tablet Take 1 tablet (150 mg total) by mouth at bedtime. 90 tablet 6 08/15/2014 at Unknown time  . ibrutinib (IMBRUVICA) 140 MG capsul Take 4 capsules (560 mg total) by mouth daily. 120 capsule 6 not yet started   Anti-infectives     Start     Dose/Rate Route Frequency Ordered Stop   08/19/14 1600  vancomycin (VANCOCIN) 1,250 mg in sodium chloride 0.9 % 250 mL IVPB     1,250 mg166.7 mL/hr over 90 Minutes Intravenous Every 12 hours 08/19/14 1511     08/19/14 1530  ceFEPIme (MAXIPIME) 2 g in dextrose 5 % 50 mL IVPB     2 g100 mL/hr over 30 Minutes Intravenous Every 12 hours 08/19/14 1511  08/17/14 1000  levofloxacin (LEVAQUIN) tablet 500 mg  Status:  Discontinued     500 mg Oral Daily 08/17/14 0751 08/19/14 1455   08/17/14 0200  vancomycin (VANCOCIN) IVPB 750 mg/150 ml premix  Status:  Discontinued     750 mg150 mL/hr over 60 Minutes Intravenous Every 12 hours 08/16/14 1457 08/18/14 1424   08/16/14 2200  acyclovir (ZOVIRAX) tablet 800 mg     800 mg Oral 2 times daily 08/16/14 1445     08/16/14 1600  piperacillin-tazobactam (ZOSYN) IVPB 3.375 g  Status:  Discontinued     3.375 g12.5 mL/hr over 240 Minutes Intravenous Every 8 hours 08/16/14 1458 08/17/14 0750   08/16/14 1530  vancomycin (VANCOCIN) IVPB 1000 mg/200 mL premix     1,000 mg200 mL/hr over 60 Minutes Intravenous  Once 08/16/14 1457 08/16/14 1739   08/16/14 0830  piperacillin-tazobactam (ZOSYN) IVPB 3.375 g     3.375 g100 mL/hr over 30 Minutes Intravenous  Once 08/16/14 0825 08/16/14 1007     Assessment: 74yo M w/ 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 Found to have enlarged spleen, hypotension, and leukocytosis. Pharmacy is asked to dose Vanc and Zosyn for sepsis. Abx were de-escalated to oral Levaquin but now back to Vanc and Cefepime d/t clinical deterioration, worsening CXR, and rising WBCs.    1/14 >> Zosyn >> 1/15, 1/17 >> 1/14 >> Vanc >> 1/16, 1/17 >> 1/15 >> Levaquin PO [MD] >> 1/17  Tmax: improved to afebrile WBCs: elevated and rising Renal: SCr 1.52>1.57>1.26, CrCl 63 CG  1/14 AM blood x2: NGtd 1/14 PM blood x2: NGtd 1/14 urine: NGF 1/14 MRSA PCR screen: negative  Goal of  Therapy:  Vancomycin trough level 15-20 mcg/ml  Appropriate antibiotic dosing for renal function; eradication of infection  Plan:  Vancomycin 1250mg  IV q12h. Cefepime 2g IV q12h. Measure Vanc trough at steady state. Follow up renal fxn and culture results.  Romeo Rabon, PharmD, pager [phone]. 08/19/2014,3:13 PM.

## 2014-08-19 NOTE — Progress Notes (Signed)
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 Patient also found to be in respiratory failure having a low-grade temperature. Initial workup included a chest x-ray which did not reveal acute cardiopulmonary disease. Labs did reveal an elevated white count of 19,100 with lactic acid of 2.54. Patient having a history of malignancy and on chronic immunosuppressive therapy having a high risk for infection. He will started on broad-spectrum IV antimicrobial therapy with Zosyn and vancomycin. On the following morning his diet was advanced. I spoke with oncology who recommended transfusing 2 units of packed red blood cells and stopping IV vancomycin and zosyn and starting levaquin. Repeat CXR on 08/18/2014 showing increased bibasilar atelectasis versus infiltrates. On 08/19/2014 white count increased to 27,200 as patient reported feeling poorly. I restarted broad spectrum IV AB therapy with Vanc and cefepime. Checking a repeat CXR.   Assessment/Plan: 1. Mantle cell lymphoma -Patient currently under the care of Dr. Alvy Bimler, has history of advanced/recurrent mantle cell lymphoma. Second line therapy was discussed with patient however goal of treatment would be palliative. -He remains on prednisone 60 mg by mouth daily, with oncology recommending ibrutinib during this hospitalization. -Hg stable  2.  Suspected healthcare associated pneumonia -Patient clinically deteriorating on 08/19/2014 as labs showed an upward trend in white count to 27,200 from 21,500 day prior -CXR on 08/18/2014 showed increasing bibasilar atelectasis vs infiltrates -Will repeat CXR today as I am concerned  with worsening HCAP.  -Blood cultures from 08/16/2014 remain sterile, will repeat blood cultures given elevated white count  3.  Acute respiratory failure -Evidence by a respiratory rate of 31, patient presenting respiratory distress, having an increased oxygen requirement -To be related to an underlying infectious process although initial chest x-ray did not show evidence of infiltrate -Repeating CXR today  4.  Sepsis -Present on admission, evidenced by a white count of 19,000, respiratory rate of 31, lactic acid of 2.54 -Suspect source of infection to be pneumonia given patient's complaints of shortness of breath -White count trending up to 27,000 -Repeating blood cultures and restarting IV broad spectrum AB's  5.  Anemia in neoplastic disease -Patient does not appear to have evidence of GI bleed -Case discussed with oncology who recommended transfusing 2 units packed red blood cells at this time -Repeat labs showing Hg of 9.4  6.  Thrombocytopenia -AM labs showing platelet count of 46,000  Code Status: Full code Family Communication:  Disposition Plan: Continue close monitoring    Consultants:  Oncology   Antibiotics:  Levaquin stopped on 08/19/2014  Vancomycin restarted on 08/19/2014  Cefepime started on on 08/19/2014   HPI/Subjective: Patient reports not feeling well today, reports fatigue and generalized weakness  Objective: Filed Vitals:   08/19/14 1345  BP: 158/83  Pulse: 64  Temp: 97.6 F (36.4 C)  Resp: 20    Intake/Output Summary (Last 24 hours) at 08/19/14 1450 Last data filed at 08/19/14 1348  Gross per 24 hour  Intake    720 ml  Output    775 ml  Net    -55 ml   Filed Weights   08/16/14 2000 08/17/14 1123  Weight: 92.4 kg (203 lb 11.3 oz) 93.305 kg (205 lb 11.2 oz)  Exam:   General: Patient appears ill reporting not feeling well, change from yesterday's exam  Cardiovascular: Tachycardic, regular rate and rhythm normal  S1-S2  Respiratory: Tachypneic, lungs appear to be clear auscultation, no wheezes or rales  Abdomen: Soft nontender nondistended  Musculoskeletal: no edema  Data Reviewed: Basic Metabolic Panel:  Recent Labs Lab 08/16/14 0744 08/17/14 0510 08/18/14 0500  NA 143 140 138  K 3.8 3.6 4.1  CL 112 111 108  CO2 20 20 21   GLUCOSE 141* 110* 174*  BUN 21 21 21   CREATININE 1.52* 1.57* 1.26  CALCIUM 8.8 7.9* 8.2*   Liver Function Tests:  Recent Labs Lab 08/16/14 0744  AST 29  ALT 21  ALKPHOS 80  BILITOT 0.6  PROT 5.7*  ALBUMIN 3.2*   No results for input(s): LIPASE, AMYLASE in the last 168 hours. No results for input(s): AMMONIA in the last 168 hours. CBC:  Recent Labs Lab 08/16/14 0744 08/16/14 1507 08/17/14 0510 08/18/14 0500 08/19/14 0549  WBC 18.0* 19.1* 17.5* 21.5* 27.2*  NEUTROABS 5.8  --   --   --  5.4  HGB 7.1* 6.6* 7.0* 9.1* 9.4*  HCT 21.4* 19.2* 20.3* 26.4* 28.1*  MCV 91.8 90.1 89.8 89.8 91.5  PLT 80* 54* 45* 36* 46*   Cardiac Enzymes: No results for input(s): CKTOTAL, CKMB, CKMBINDEX, TROPONINI in the last 168 hours. BNP (last 3 results) No results for input(s): PROBNP in the last 8760 hours. CBG: No results for input(s): GLUCAP in the last 168 hours.  Recent Results (from the past 240 hour(s))  Blood Culture (routine x 2)     Status: None (Preliminary result)   Collection Time: 08/16/14  7:44 AM  Result Value Ref Range Status   Specimen Description BLOOD RIGHT ANTECUBITAL  Final   Special Requests BOTTLES DRAWN AEROBIC AND ANAEROBIC 4CC  Final   Culture   Final           BLOOD CULTURE RECEIVED NO GROWTH TO DATE CULTURE WILL BE HELD FOR 5 DAYS BEFORE ISSUING A FINAL NEGATIVE REPORT Performed at [facility]    Report Status PENDING  Incomplete  Blood Culture (routine x 2)     Status: None (Preliminary result)   Collection Time: 08/16/14  7:44 AM  Result Value Ref Range Status   Specimen Description BLOOD LEFT FOREARM  Final   Special  Requests BOTTLES DRAWN AEROBIC ONLY 2ML  Final   Culture   Final           BLOOD CULTURE RECEIVED NO GROWTH TO DATE CULTURE WILL BE HELD FOR 5 DAYS BEFORE ISSUING A FINAL NEGATIVE REPORT Performed at [facility]    Report Status PENDING  Incomplete  Urine culture     Status: None   Collection Time: 08/16/14 10:10 AM  Result Value Ref Range Status   Specimen Description URINE, CATHETERIZED  Final   Special Requests NONE  Final   Colony Count NO GROWTH Performed at [facility]   Final   Culture NO GROWTH Performed at [facility]   Final   Report Status 08/17/2014 FINAL  Final  Culture, blood (routine x 2)     Status: None (Preliminary result)   Collection Time: 08/16/14  3:07 PM  Result Value Ref Range Status   Specimen Description BLOOD RIGHT ARM  Final   Special Requests BOTTLES DRAWN AEROBIC AND ANAEROBIC 5ML  Final   Culture   Final  BLOOD CULTURE RECEIVED NO GROWTH TO DATE CULTURE WILL BE HELD FOR 5 DAYS BEFORE ISSUING A FINAL NEGATIVE REPORT Performed at [facility]    Report Status PENDING  Incomplete  Culture, blood (routine x 2)     Status: None (Preliminary result)   Collection Time: 08/16/14  3:08 PM  Result Value Ref Range Status   Specimen Description BLOOD LEFT HAND  Final   Special Requests BOTTLES DRAWN AEROBIC AND ANAEROBIC 4ML AND 3ML  Final   Culture   Final           BLOOD CULTURE RECEIVED NO GROWTH TO DATE CULTURE WILL BE HELD FOR 5 DAYS BEFORE ISSUING A FINAL NEGATIVE REPORT Performed at [facility]    Report Status PENDING  Incomplete  MRSA PCR Screening     Status: None   Collection Time: 08/16/14  6:11 PM  Result Value Ref Range Status   MRSA by PCR NEGATIVE NEGATIVE Final    Comment:        The GeneXpert MRSA Assay (FDA approved for NASAL specimens only), is one component of a comprehensive MRSA colonization surveillance program. It is not intended to diagnose MRSA infection nor to guide  or monitor treatment for MRSA infections.      Studies: Dg Chest 2 View  08/18/2014   CLINICAL DATA:  Healthcare associated pneumonia. Sepsis. Mantle cell lymphoma.  EXAM: CHEST  2 VIEW  COMPARISON:  08/16/2014  FINDINGS: Increased linear opacity is seen in the right infrahilar region. Increased streaky opacity also seen in left retrocardiac lung base. No evidence of pleural effusion. Heart size is normal. Right-sided Port-A-Cath remains in place.  IMPRESSION: Increased bibasilar atelectasis versus infiltrates.   Electronically Signed   By: Earle Gell M.D.   On: 08/18/2014 12:08    Scheduled Meds: . acyclovir  800 mg Oral BID  . albuterol  2.5 mg Nebulization BID  . cholecalciferol  2,000 Units Oral q morning - 10a  . famotidine  20 mg Oral BID  . feeding supplement (RESOURCE BREEZE)  1 Container Oral TID BM  . ibrutinib  560 mg Oral Daily  . levofloxacin  500 mg Oral Daily  . levothyroxine  75 mcg Oral QAC breakfast  . loratadine  10 mg Oral q morning - 10a  . pantoprazole  40 mg Oral Daily  . predniSONE  60 mg Oral Q breakfast   Continuous Infusions:    Principal Problem:   Sepsis Active Problems:   Mantle cell lymphoma   Hypothyroidism   Anemia in neoplastic disease   Thrombocytopenia   S/P autologous bone marrow transplantation   Dyspnea   Acute respiratory failure with hypoxia   Acute renal failure   History of non-Hodgkin's lymphoma    Time spent: 35 min    Kelvin Cellar  [facility] Pager [phone]. If 7PM-7AM, please contact night-coverage at [email or web address], password [facility] 08/19/2014, 2:50 PM  LOS: 3 days

## 2014-08-20 DIAGNOSIS — R109 Unspecified abdominal pain: Secondary | ICD-10-CM

## 2014-08-20 DIAGNOSIS — E46 Unspecified protein-calorie malnutrition: Secondary | ICD-10-CM

## 2014-08-20 DIAGNOSIS — D6959 Other secondary thrombocytopenia: Secondary | ICD-10-CM

## 2014-08-20 LAB — BASIC METABOLIC PANEL
ANION GAP: 9 (ref 5–15)
BUN: 29 mg/dL — ABNORMAL HIGH (ref 6–23)
CALCIUM: 8.8 mg/dL (ref 8.4–10.5)
CO2: 20 mmol/L (ref 19–32)
CREATININE: 1.19 mg/dL (ref 0.50–1.35)
Chloride: 113 mEq/L — ABNORMAL HIGH (ref 96–112)
GFR calc Af Amer: 69 mL/min — ABNORMAL LOW (ref >90)
GFR calc non Af Amer: 59 mL/min — ABNORMAL LOW (ref >90)
Glucose, Bld: 111 mg/dL — ABNORMAL HIGH (ref 70–99)
POTASSIUM: 4 mmol/L (ref 3.5–5.1)
Sodium: 142 mmol/L (ref 135–145)

## 2014-08-20 LAB — CBC WITH DIFFERENTIAL/PLATELET
BASOS PCT: 0 % (ref 0–1)
BLASTS: 0 %
Band Neutrophils: 0 % (ref 0–10)
Basophils Absolute: 0 10*3/uL (ref 0.0–0.1)
EOS PCT: 0 % (ref 0–5)
Eosinophils Absolute: 0 10*3/uL (ref 0.0–0.7)
HEMATOCRIT: 32.7 % — AB (ref 39.0–52.0)
Hemoglobin: 11.2 g/dL — ABNORMAL LOW (ref 13.0–17.0)
LYMPHS PCT: 90 % — AB (ref 12–46)
Lymphs Abs: 31.4 10*3/uL — ABNORMAL HIGH (ref 0.7–4.0)
MCH: 31 pg (ref 26.0–34.0)
MCHC: 34.3 g/dL (ref 30.0–36.0)
MCV: 90.6 fL (ref 78.0–100.0)
Metamyelocytes Relative: 0 %
Monocytes Absolute: 0.4 10*3/uL (ref 0.1–1.0)
Monocytes Relative: 1 % — ABNORMAL LOW (ref 3–12)
Myelocytes: 0 %
Neutro Abs: 3.1 10*3/uL (ref 1.7–7.7)
Neutrophils Relative %: 9 % — ABNORMAL LOW (ref 43–77)
Platelets: 57 10*3/uL — ABNORMAL LOW (ref 150–400)
Promyelocytes Absolute: 0 %
RBC: 3.61 MIL/uL — ABNORMAL LOW (ref 4.22–5.81)
RDW: 15.1 % (ref 11.5–15.5)
WBC: 34.9 10*3/uL — ABNORMAL HIGH (ref 4.0–10.5)
nRBC: 0 /100 WBC

## 2014-08-20 NOTE — Progress Notes (Signed)
Patient's blood pressure elevated this am.  PRN hydralazine given.  Will recheck BP and continue to monitor patient. Owens Shark, Georgann Bramble Cherie

## 2014-08-20 NOTE — Progress Notes (Signed)
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 Patient also found to be in respiratory failure having a low-grade temperature. Initial workup included a chest x-ray which did not reveal acute cardiopulmonary disease. Labs did reveal an elevated white count of 19,100 with lactic acid of 2.54. Patient having a history of malignancy and on chronic immunosuppressive therapy having a high risk for infection. He will started on broad-spectrum IV antimicrobial therapy with Zosyn and vancomycin. On the following morning his diet was advanced. I spoke with oncology who recommended transfusing 2 units of packed red blood cells and stopping IV vancomycin and zosyn and starting levaquin. Repeat CXR on 08/18/2014 showing increased bibasilar atelectasis versus infiltrates. On 08/19/2014 white count increased to 27,200 as patient reported feeling poorly. I restarted broad spectrum IV AB therapy with Vanc and cefepime. Checking a repeat CXR.   Assessment/Plan: 1. Mantle cell lymphoma -Patient currently under the care of Dr. Alvy Bimler, has history of advanced/recurrent mantle cell lymphoma. Second line therapy was discussed with patient however goal of treatment would be palliative. -He remains on prednisone 60 mg by mouth daily, with oncology recommending ibrutinib during this hospitalization. -Hg stable  2.  Suspected healthcare associated pneumonia -Patient clinically deteriorating on 08/19/2014 as labs showed an upward trend in white count to 27,200 from 21,500 day prior -CXR on 08/18/2014 showed increasing bibasilar atelectasis vs infiltrates Repeat chest x-ray shows left lower lobe  atelectasis versus bronchopneumonia -Blood cultures from 08/16/2014 and 1/17, no growth so far, discontinue vancomycin,  Continue cefepime   3.  Acute respiratory failure -Improving-To be related to an underlying infectious process although initial chest x-ray did not show evidence of infiltrate    4.  Sepsis -Present on admission, evidenced by a white count of 19,000, respiratory rate of 31, lactic acid of 2.54 -Suspect source of infection to be pneumonia given patient's complaints of shortness of breath -White count trending up secondary to steroids Follow blood culture closely   5.  Anemia in neoplastic disease -Patient does not appear to have evidence of GI bleed Hb was 7.1 on admission, received 4 units of blood. Today, Hb is 11.2  6.  Thrombocytopenia -AM labs showing platelet count of 46,000 Will observe for now. Bactrim placed on hold since 1/11 This will drop while on Ibrutinib.  As long as his platelet count is above 20,000, do not stop treatment  Code Status: Full code Family Communication:  Disposition Plan: Hopefully he can be discharged within the next 48 hours if he continues to improve   Consultants:  Oncology   Antibiotics:  Levaquin stopped on 08/19/2014  Vancomycin restarted on 08/19/2014  Cefepime started on on 08/19/2014   HPI/Subjective: Improvement in shortness of breath, bilateral shoulder pain  Objective: Filed Vitals:   08/20/14 0833  BP: 149/75  Pulse: 83  Temp:   Resp: 20    Intake/Output Summary (Last 24 hours) at 08/20/14 1321 Last data filed at 08/20/14 1319  Gross per 24 hour  Intake    960 ml  Output   4725 ml  Net  -3765 ml   Filed Weights   08/16/14 2000 08/17/14 1123  Weight: 92.4 kg (203 lb 11.3 oz) 93.305 kg (205  lb 11.2 oz)    Exam:   General: Patient appears ill reporting not feeling well, change from yesterday's exam  Cardiovascular: Tachycardic, regular rate and rhythm normal S1-S2  Respiratory:  Tachypneic, lungs appear to be clear auscultation, no wheezes or rales  Abdomen: Soft nontender nondistended  Musculoskeletal: no edema  Data Reviewed: Basic Metabolic Panel:  Recent Labs Lab 08/16/14 0744 08/17/14 0510 08/18/14 0500 08/20/14 0738  NA 143 140 138 142  K 3.8 3.6 4.1 4.0  CL 112 111 108 113*  CO2 20 20 21 20   GLUCOSE 141* 110* 174* 111*  BUN 21 21 21  29*  CREATININE 1.52* 1.57* 1.26 1.19  CALCIUM 8.8 7.9* 8.2* 8.8   Liver Function Tests:  Recent Labs Lab 08/16/14 0744  AST 29  ALT 21  ALKPHOS 80  BILITOT 0.6  PROT 5.7*  ALBUMIN 3.2*   No results for input(s): LIPASE, AMYLASE in the last 168 hours. No results for input(s): AMMONIA in the last 168 hours. CBC:  Recent Labs Lab 08/16/14 0744 08/16/14 1507 08/17/14 0510 08/18/14 0500 08/19/14 0549 08/20/14 0738  WBC 18.0* 19.1* 17.5* 21.5* 27.2* 34.9*  NEUTROABS 5.8  --   --   --  5.4 3.1  HGB 7.1* 6.6* 7.0* 9.1* 9.4* 11.2*  HCT 21.4* 19.2* 20.3* 26.4* 28.1* 32.7*  MCV 91.8 90.1 89.8 89.8 91.5 90.6  PLT 80* 54* 45* 36* 46* 57*   Cardiac Enzymes: No results for input(s): CKTOTAL, CKMB, CKMBINDEX, TROPONINI in the last 168 hours. BNP (last 3 results) No results for input(s): PROBNP in the last 8760 hours. CBG: No results for input(s): GLUCAP in the last 168 hours.  Recent Results (from the past 240 hour(s))  Blood Culture (routine x 2)     Status: None (Preliminary result)   Collection Time: 08/16/14  7:44 AM  Result Value Ref Range Status   Specimen Description BLOOD RIGHT ANTECUBITAL  Final   Special Requests BOTTLES DRAWN AEROBIC AND ANAEROBIC 4CC  Final   Culture   Final           BLOOD CULTURE RECEIVED NO GROWTH TO DATE CULTURE WILL BE HELD FOR 5 DAYS BEFORE ISSUING A FINAL NEGATIVE REPORT Performed at [facility]    Report Status PENDING  Incomplete  Blood Culture (routine x 2)     Status: None (Preliminary result)   Collection Time: 08/16/14  7:44 AM  Result Value Ref  Range Status   Specimen Description BLOOD LEFT FOREARM  Final   Special Requests BOTTLES DRAWN AEROBIC ONLY 2ML  Final   Culture   Final           BLOOD CULTURE RECEIVED NO GROWTH TO DATE CULTURE WILL BE HELD FOR 5 DAYS BEFORE ISSUING A FINAL NEGATIVE REPORT Performed at [facility]    Report Status PENDING  Incomplete  Urine culture     Status: None   Collection Time: 08/16/14 10:10 AM  Result Value Ref Range Status   Specimen Description URINE, CATHETERIZED  Final   Special Requests NONE  Final   Colony Count NO GROWTH Performed at [facility]   Final   Culture NO GROWTH Performed at [facility]   Final   Report Status 08/17/2014 FINAL  Final  Culture, blood (routine x 2)     Status: None (Preliminary result)   Collection Time: 08/16/14  3:07 PM  Result Value Ref Range Status   Specimen Description BLOOD RIGHT ARM  Final   Special  Requests BOTTLES DRAWN AEROBIC AND ANAEROBIC 5ML  Final   Culture   Final           BLOOD CULTURE RECEIVED NO GROWTH TO DATE CULTURE WILL BE HELD FOR 5 DAYS BEFORE ISSUING A FINAL NEGATIVE REPORT Performed at [facility]    Report Status PENDING  Incomplete  Culture, blood (routine x 2)     Status: None (Preliminary result)   Collection Time: 08/16/14  3:08 PM  Result Value Ref Range Status   Specimen Description BLOOD LEFT HAND  Final   Special Requests BOTTLES DRAWN AEROBIC AND ANAEROBIC 4ML AND 3ML  Final   Culture   Final           BLOOD CULTURE RECEIVED NO GROWTH TO DATE CULTURE WILL BE HELD FOR 5 DAYS BEFORE ISSUING A FINAL NEGATIVE REPORT Performed at [facility]    Report Status PENDING  Incomplete  MRSA PCR Screening     Status: None   Collection Time: 08/16/14  6:11 PM  Result Value Ref Range Status   MRSA by PCR NEGATIVE NEGATIVE Final    Comment:        The GeneXpert MRSA Assay (FDA approved for NASAL specimens only), is one component of a comprehensive MRSA colonization surveillance  program. It is not intended to diagnose MRSA infection nor to guide or monitor treatment for MRSA infections.   Culture, blood (routine x 2)     Status: None (Preliminary result)   Collection Time: 08/19/14  3:17 PM  Result Value Ref Range Status   Specimen Description BLOOD RIGHT ARM  Final   Special Requests BOTTLES DRAWN AEROBIC ONLY 5CC  Final   Culture   Final           BLOOD CULTURE RECEIVED NO GROWTH TO DATE CULTURE WILL BE HELD FOR 5 DAYS BEFORE ISSUING A FINAL NEGATIVE REPORT Performed at [facility]    Report Status PENDING  Incomplete  Culture, blood (routine x 2)     Status: None (Preliminary result)   Collection Time: 08/19/14  3:27 PM  Result Value Ref Range Status   Specimen Description BLOOD RIGHT HAND  Final   Special Requests   Final    BOTTLES DRAWN AEROBIC AND ANAEROBIC 10CC BOTH BOTTLES   Culture   Final           BLOOD CULTURE RECEIVED NO GROWTH TO DATE CULTURE WILL BE HELD FOR 5 DAYS BEFORE ISSUING A FINAL NEGATIVE REPORT Performed at [facility]    Report Status PENDING  Incomplete     Studies: Dg Chest 2 View  08/19/2014   CLINICAL DATA:  Generalized weakness and syncopal episode at home approximately 3 days ago. Prior history of non-Hodgkin's lymphoma (mantle cell lymphoma) for which he underwent autologous bone marrow transplant approximately 4 months ago. Followup bibasilar atelectasis.  EXAM: CHEST  2 VIEW  COMPARISON:  08/16/2014 dating back to 09/12/2013. PET-CT 07/13/2014. CT chest 08/10/2014.  FINDINGS: Cardiac silhouette normal in size, unchanged. Thoracic aorta mildly atherosclerotic, unchanged. Hilar and mediastinal contours otherwise unremarkable. Stable mild streaky and patchy airspace opacities in the left lower lobe since the examination yesterday, new since the examination 2 days ago. No new pulmonary parenchymal abnormality. No visible pleural effusions. Visualized bony thorax intact. Right jugular Port-A-Cath tip in the upper  SVC, unchanged.  IMPRESSION: Stable left lower lobe atelectasis versus bronchopneumonia since yesterday. No new abnormality.   Electronically Signed   By: Sherran Needs.D.  On: 08/19/2014 17:08    Scheduled Meds: . acyclovir  800 mg Oral BID  . albuterol  2.5 mg Nebulization BID  . ceFEPime (MAXIPIME) IV  2 g Intravenous Q12H  . cholecalciferol  2,000 Units Oral q morning - 10a  . famotidine  20 mg Oral BID  . feeding supplement (RESOURCE BREEZE)  1 Container Oral TID BM  . ibrutinib  560 mg Oral Daily  . levothyroxine  75 mcg Oral QAC breakfast  . loratadine  10 mg Oral q morning - 10a  . pantoprazole  40 mg Oral Daily  . predniSONE  60 mg Oral Q breakfast   Continuous Infusions:    Principal Problem:   Sepsis Active Problems:   Mantle cell lymphoma   Hypothyroidism   Anemia in neoplastic disease   Thrombocytopenia   S/P autologous bone marrow transplantation   Dyspnea   Acute respiratory failure with hypoxia   Acute renal failure   History of non-Hodgkin's lymphoma    Time spent: 35 min    [facility]  [facility] Pager [phone]  If 7PM-7AM, please contact night-coverage at [email or web address], password [facility] 08/20/2014, 1:21 PM  LOS: 4 days

## 2014-08-20 NOTE — Progress Notes (Signed)
Patient's BP was elevated at bedtime; 171/79.  Patient denied any discomfort, chest pain, headache, or shortness of breath.  Notified NP on call and order received for prn Hydralazine.  Gave prn dose, BP now within normal limits.  Will continue to monitor patient.

## 2014-08-20 NOTE — Progress Notes (Signed)
Manuel [name]   DOB:[birth year]   HV#:[id number]    Subjective: Overall, he is feeling better. Denies cough. Shortness of breath has improved since he received blood transfusion. He had mild loose bowel movement yesterday. Denies fevers or chills. Bilateral shoulder pain has resolved. He still has mild left upper quadrant discomfort from splenomegaly  Objective:  Filed Vitals:   08/20/14 0609  BP: 170/78  Pulse: 59  Temp:   Resp:      Intake/Output Summary (Last 24 hours) at 08/20/14 0831 Last data filed at 08/20/14 9643  Gross per 24 hour  Intake   1200 ml  Output   3325 ml  Net  -2125 ml    GENERAL:alert, no distress and comfortable SKIN: skin color, texture, turgor are normal, no rashes or significant lesions EYES: normal, Conjunctiva are pink and non-injected, sclera clear OROPHARYNX:no exudate, no erythema and lips, buccal mucosa, and tongue normal  NECK: supple, thyroid normal size, non-tender, without nodularity LYMPH:  no palpable lymphadenopathy in the cervical, axillary or inguinal LUNGS: clear to auscultation and percussion with normal breathing effort HEART: regular rate & rhythm and no murmurs and no lower extremity edema ABDOMEN:abdomen soft, non-tender and normal bowel sounds. Splenomegaly is present Musculoskeletal:no cyanosis of digits and no clubbing  NEURO: alert & oriented x 3 with fluent speech, no focal motor/sensory deficits   Labs:  Lab Results  Component Value Date   WBC 34.9* 08/20/2014   HGB 11.2* 08/20/2014   HCT 32.7* 08/20/2014   MCV 90.6 08/20/2014   PLT 57* 08/20/2014   NEUTROABS 3.1 08/20/2014    Lab Results  Component Value Date   NA 142 08/20/2014   K 4.0 08/20/2014   CL 113* 08/20/2014   CO2 20 08/20/2014    Studies: I have reviewed the chest x-ray myself Dg Chest 2 View  08/19/2014   CLINICAL DATA:  Generalized weakness and syncopal episode at home approximately 3 days ago. Prior history of non-Hodgkin's lymphoma (mantle cell  lymphoma) for which he underwent autologous bone marrow transplant approximately 4 months ago. Followup bibasilar atelectasis.  EXAM: CHEST  2 VIEW  COMPARISON:  08/16/2014 dating back to 09/12/2013. PET-CT 07/13/2014. CT chest 08/10/2014.  FINDINGS: Cardiac silhouette normal in size, unchanged. Thoracic aorta mildly atherosclerotic, unchanged. Hilar and mediastinal contours otherwise unremarkable. Stable mild streaky and patchy airspace opacities in the left lower lobe since the examination yesterday, new since the examination 2 days ago. No new pulmonary parenchymal abnormality. No visible pleural effusions. Visualized bony thorax intact. Right jugular Port-A-Cath tip in the upper SVC, unchanged.  IMPRESSION: Stable left lower lobe atelectasis versus bronchopneumonia since yesterday. No new abnormality.   Electronically Signed   By: Evangeline Dakin M.D.   On: 08/19/2014 17:08   Dg Chest 2 View  08/18/2014   CLINICAL DATA:  Healthcare associated pneumonia. Sepsis. Mantle cell lymphoma.  EXAM: CHEST  2 VIEW  COMPARISON:  08/16/2014  FINDINGS: Increased linear opacity is seen in the right infrahilar region. Increased streaky opacity also seen in left retrocardiac lung base. No evidence of pleural effusion. Heart size is normal. Right-sided Port-A-Cath remains in place.  IMPRESSION: Increased bibasilar atelectasis versus infiltrates.   Electronically Signed   By: Earle Gell M.D.   On: 08/18/2014 12:08    Assessment & Plan:   Mantle cell lymphoma Unfortunately, the patient have high disease burden. Second line treatment for recurrent mantle cell lymphoma was discussed on 08/13/14. Intent is palliative.  He was started on 60 mg of prednisone  daily last week. Plan to taper slowly as outpatient He has continued to receive ibrutinib in hospital.  I think it has started to work as the patient has resolution of bilateral shoulder pain which I suspect is due to referred pain from splenomegaly  Anemia in  neoplastic disease This is likely anemia of chronic disease, splenomegaly, malignancy.  The patient denies recent history of bleeding such as epistaxis, hematuria or hematochezia.  Hb was 7.1 on admission, received 4 units of blood. Today, Hb is 11.2 Plan to keep hemoglobin >8g  Thrombocytopenia This, along with splenomegaly is highly suggestive of possible disease recurrence. The patient denies recent history of bleeding such as epistaxis, hematuria or hematochezia.  He is asymptomatic from the thrombocytopenia. Will observe for now. Bactrim placed on hold since 1/11 This will drop while on Ibrutinib.  As long as his platelet count is above 20,000, do not stop treatment  Abdominal pain This is due to splenomegaly in the setting of malignancy CT of the abdomen and pelvis on 1/8 showed significant increase in the size and volume of the spleen with development of a low-attenuation lesion in the posterior subcapsular spleen. Will monitor for now. Pain medication prn  Leukocytosis Fever, resolved This is due to Prednisone, inflammation, pain, malignancy, possible infection Cultures are pending He was placed on Zosyn and Vancomycin.  We discussed with hospitalist team, as Zosyn may interfere with counts. He will be switched to Levaquin and continue Vanco Will continue to monitor Ibrutinib that can also cause leukocytosis. This consider stopping vancomycin if cultures are negative.  Bilateral shoulder pain, resolved This is likely related to referred pain. On oxycodone to take as needed.  Nausea with vomiting, resolved Suspect this is due to sensation of gastritis from his enlarged spleen causing increased reflux. Continue Prilosec and Zantac   Hypertension Likely due to fluid overload. Recommend gentle diuresis  Malnutrition His appetite is improving since admission  Weakness We'll refer him to PT for evaluation prior to discharge home  Acute renal failure, resolved He  is eating better.  Abnormal chest x-ray He is on antibiotics. Recommend incentive spirometry. This could be due to atelectasis  Discharge planning Hopefully he can be discharged within the next 48 hours if he continues to improve  Other medical issues as per admitting team    [facility], [place], MD 08/20/2014  8:31 AM

## 2014-08-21 LAB — COMPREHENSIVE METABOLIC PANEL
ALBUMIN: 3.1 g/dL — AB (ref 3.5–5.2)
ALT: 20 U/L (ref 0–53)
ANION GAP: 8 (ref 5–15)
AST: 18 U/L (ref 0–37)
Alkaline Phosphatase: 67 U/L (ref 39–117)
BILIRUBIN TOTAL: 0.9 mg/dL (ref 0.3–1.2)
BUN: 33 mg/dL — ABNORMAL HIGH (ref 6–23)
CHLORIDE: 113 meq/L — AB (ref 96–112)
CO2: 23 mmol/L (ref 19–32)
Calcium: 8.8 mg/dL (ref 8.4–10.5)
Creatinine, Ser: 1.36 mg/dL — ABNORMAL HIGH (ref 0.50–1.35)
GFR calc Af Amer: 58 mL/min — ABNORMAL LOW (ref >90)
GFR, EST NON AFRICAN AMERICAN: 50 mL/min — AB (ref >90)
GLUCOSE: 102 mg/dL — AB (ref 70–99)
POTASSIUM: 3.7 mmol/L (ref 3.5–5.1)
SODIUM: 144 mmol/L (ref 135–145)
TOTAL PROTEIN: 5.5 g/dL — AB (ref 6.0–8.3)

## 2014-08-21 LAB — CBC WITH DIFFERENTIAL/PLATELET
Band Neutrophils: 0 % (ref 0–10)
Basophils Absolute: 0 10*3/uL (ref 0.0–0.1)
Basophils Relative: 0 % (ref 0–1)
Blasts: 0 %
EOS PCT: 0 % (ref 0–5)
Eosinophils Absolute: 0 10*3/uL (ref 0.0–0.7)
HCT: 31.6 % — ABNORMAL LOW (ref 39.0–52.0)
HEMOGLOBIN: 10.6 g/dL — AB (ref 13.0–17.0)
Lymphocytes Relative: 81 % — ABNORMAL HIGH (ref 12–46)
Lymphs Abs: 20.3 10*3/uL — ABNORMAL HIGH (ref 0.7–4.0)
MCH: 30.7 pg (ref 26.0–34.0)
MCHC: 33.5 g/dL (ref 30.0–36.0)
MCV: 91.6 fL (ref 78.0–100.0)
METAMYELOCYTES PCT: 0 %
MONO ABS: 0.8 10*3/uL (ref 0.1–1.0)
MONOS PCT: 3 % (ref 3–12)
Myelocytes: 0 %
NRBC: 0 /100{WBCs}
Neutro Abs: 4 10*3/uL (ref 1.7–7.7)
Neutrophils Relative %: 16 % — ABNORMAL LOW (ref 43–77)
PLATELETS: 57 10*3/uL — AB (ref 150–400)
Promyelocytes Absolute: 0 %
RBC: 3.45 MIL/uL — ABNORMAL LOW (ref 4.22–5.81)
RDW: 15.1 % (ref 11.5–15.5)
WBC: 25.1 10*3/uL — AB (ref 4.0–10.5)

## 2014-08-21 NOTE — Progress Notes (Addendum)
Manuel [name]   DOB:November 05, 1940   WV#:[id number]    I have seen the patient, examined him and edited the notes as follows  Subjective:He reports not feeling well this morning. Denies cough. Shortness of breath has improved since he received blood transfusion. He complains of recurrence of left abdominal pain due to splenomegaly. Appetite decreased due to pain. Denies nausea or vomiting, diarrhea or constipation. Denies fevers or chills. No confusion is reported.  Scheduled Meds: . acyclovir  800 mg Oral BID  . ceFEPime (MAXIPIME) IV  2 g Intravenous Q12H  . cholecalciferol  2,000 Units Oral q morning - 10a  . famotidine  20 mg Oral BID  . feeding supplement (RESOURCE BREEZE)  1 Container Oral TID BM  . ibrutinib  560 mg Oral Daily  . levothyroxine  75 mcg Oral QAC breakfast  . loratadine  10 mg Oral q morning - 10a  . pantoprazole  40 mg Oral Daily  . predniSONE  60 mg Oral Q breakfast   Continuous Infusions:  PRN Meds:.acetaminophen, albuterol, hydrALAZINE, ondansetron **OR** ondansetron (ZOFRAN) IV, oxyCODONE Objective:  Filed Vitals:   08/21/14 0631  BP: 158/81  Pulse: 56  Temp: 97.8 F (36.6 C)  Resp: 20     Intake/Output Summary (Last 24 hours) at 08/21/14 0804 Last data filed at 08/21/14 0640  Gross per 24 hour  Intake    530 ml  Output   2450 ml  Net  -1920 ml    GENERAL:alert, no distress and uncomfortable due to left abdominal pain SKIN: skin color, texture, turgor are normal, no rashes or significant lesions EYES: normal, Conjunctiva are pink and non-injected, sclera clear OROPHARYNX:no exudate, no erythema and lips, buccal mucosa, and tongue normal  NECK: supple, thyroid normal size, non-tender, without nodularity LYMPH:  no palpable lymphadenopathy in the cervical, axillary or inguinal LUNGS: clear to auscultation and percussion with normal breathing effort HEART: regular rate & rhythm and no murmurs and no lower extremity edema ABDOMEN:abdomen soft, tender on  the left upper quadrant towards the lower quadrant, and normal bowel sounds. Splenomegaly is present Musculoskeletal:no cyanosis of digits and no clubbing  NEURO: alert & oriented x 3 with fluent speech, no focal motor/sensory deficits   Labs:  Lab Results  Component Value Date   WBC 25.1* 08/21/2014   HGB 10.6* 08/21/2014   HCT 31.6* 08/21/2014   MCV 91.6 08/21/2014   PLT 57* 08/21/2014   NEUTROABS 4.0 08/21/2014    Lab Results  Component Value Date   NA 144 08/21/2014   K 3.7 08/21/2014   CL 113* 08/21/2014   CO2 23 08/21/2014    Studies No new x rays today  Assessment & Plan:   Mantle cell lymphoma Unfortunately, the patient have high disease burden. Second line treatment for recurrent mantle cell lymphoma was discussed on 08/13/14. Intent is palliative.  He was started on 60 mg of prednisone daily last week. Plan to taper slowly as outpatient He has continued to receive ibrutinib in hospital.  Suspect it has started to work as the patient has resolution of bilateral shoulder pain which I suspect is due to referred pain from splenomegaly  Anemia in neoplastic disease This is likely anemia of chronic disease, splenomegaly, malignancy.  The patient denies recent history of bleeding such as epistaxis, hematuria or hematochezia.  Hb was 7.1 on admission, received 4 units of blood. Today, Hb is 10.6 Plan to keep hemoglobin >8g  Thrombocytopenia This, along with splenomegaly is highly suggestive of possible disease  recurrence. The patient denies recent history of bleeding such as epistaxis, hematuria or hematochezia.  He is asymptomatic from the thrombocytopenia. Will observe for now. Bactrim placed on hold since 1/11 This will drop while on Ibrutinib, today at 57,000 As long as his platelet count is above 20,000, do not stop treatment  Abdominal pain This is due to splenomegaly in the setting of malignancy CT of the abdomen and pelvis on 1/8 showed significant  increase in the size and volume of the spleen with development of a low-attenuation lesion in the posterior subcapsular spleen. Will monitor for now. Pain medication prn  Leukocytosis Fever, resolved This is due to Prednisone, inflammation, pain, malignancy, possible infection, Ibrutinib Cultures are negative We discussed with hospitalist team, as Zosyn may interfere with counts. Switched to Maxipime. Vanco was discontinued on 1/18 Recommend Levaquin Will continue to monitor, today at 25.1  Bilateral shoulder pain, resolved This is likely related to referred pain. On oxycodone to take as needed.  Nausea with vomiting, resolved Suspect this is due to sensation of gastritis from his enlarged spleen causing increased reflux. Continue Prilosec and Zantac   Hypertension Likely due to fluid overload. Recommend gentle diuresis  Malnutrition His appetite is improving since admission  Weakness We'll refer him to PT for evaluation prior to discharge home  Acute renal failure, resolved Continue nutritional supplements. IVF is discontinued  Abnormal chest x-ray He is on antibiotics. Recommend incentive spirometry. This could be due to atelectasis  Discharge planning Hopefully he can be discharged tomorrow if his clinical status improves. He has appointment to see me back on 08/28/14  Other medical issues as per admitting team   [facility] E, PA-C 08/21/2014  8:04 AM  Taelar Gronewold, MD 08/21/2014

## 2014-08-21 NOTE — Evaluation (Signed)
Physical Therapy Evaluation Patient Details Name: Manuel [name] MRN: [id number] DOB: 1941/02/07 Today's Date: 08/21/2014   History of Present Illness  74 yo male admitted with sepsis, Pna,. Hx of lymphoma, stem cell transplant 2015.   Clinical Impression  On eval, pt required Min guard assist for mobility-able to ambulate ~500 feet while holding onto IV pole. Tolerated activity well. Pt denied fatigue, dizziness. Pt somewhat displeased with care here at [facility] but he was cooperative and agreeable to mobilize. Recommend daily mobility/ambulation in hallway with nursing supervision. Do not anticipate any follow up PT at discharge at this time.     Follow Up Recommendations No PT follow up;Supervision - Intermittent    Equipment Recommendations  None recommended by PT    Recommendations for Other Services       Precautions / Restrictions Precautions Precautions: None Restrictions Weight Bearing Restrictions: No      Mobility  Bed Mobility Overal bed mobility: Modified Independent                Transfers Overall transfer level: Modified independent                  Ambulation/Gait Ambulation/Gait assistance: Min guard Ambulation Distance (Feet): 500 Feet Assistive device:  (IV pole) Gait Pattern/deviations: Step-through pattern     General Gait Details: slightly unsteady intermittently but no overt LOB. Pt tolerated well-denied fatigue, dizziness  Stairs            Wheelchair Mobility    Modified Rankin (Stroke Patients Only)       Balance Overall balance assessment: Needs assistance;History of Falls         Standing balance support: No upper extremity supported;During functional activity Standing balance-Leahy Scale: Fair                               Pertinent Vitals/Pain Pain Assessment: Faces Faces Pain Scale: Hurts a little bit Pain Location: abdomen Pain Intervention(s): Monitored during session    Home Living  Family/patient expects to be discharged to:: Private residence Living Arrangements: Children (living with daughter right now)   Type of Home: House       Home Layout: Two level;Able to live on main level with bedroom/bathroom Home Equipment: Gilford Rile - 2 wheels;Cane - single point;Crutches;Wheelchair - manual      Prior Function Level of Independence: Independent               Hand Dominance        Extremity/Trunk Assessment   Upper Extremity Assessment: Overall WFL for tasks assessed           Lower Extremity Assessment: Overall WFL for tasks assessed      Cervical / Trunk Assessment: Kyphotic  Communication   Communication: No difficulties  Cognition Arousal/Alertness: Awake/alert Behavior During Therapy: WFL for tasks assessed/performed Overall Cognitive Status: Within Functional Limits for tasks assessed                      General Comments      Exercises        Assessment/Plan    PT Assessment Patient needs continued PT services  PT Diagnosis Generalized weakness;Difficulty walking   PT Problem List Decreased mobility;Pain  PT Treatment Interventions Gait training;Functional mobility training;Therapeutic activities;Therapeutic exercise;Stair training;Balance training;Patient/family education   PT Goals (Current goals can be found in the Care Plan section) Acute Rehab PT Goals Patient Stated Goal: to get  home soon PT Goal Formulation: With patient Time For Goal Achievement: 09/04/14 Potential to Achieve Goals: Good    Frequency Min 3X/week   Barriers to discharge        Co-evaluation               End of Session Equipment Utilized During Treatment: Gait belt Activity Tolerance: Patient tolerated treatment well Patient left: in chair;with call bell/phone within reach;with nursing/sitter in room           Time: 6578-4696 PT Time Calculation (min) (ACUTE ONLY): 18 min   Charges:   PT Evaluation $Initial PT  Evaluation Tier I: 1 Procedure PT Treatments $Gait Training: 8-22 mins   PT G Codes:        Weston Anna, MPT Pager: [phone]

## 2014-08-21 NOTE — Progress Notes (Signed)
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 Patient also found to be in respiratory failure having a low-grade temperature. Initial workup included a chest x-ray which did not reveal acute cardiopulmonary disease. Labs did reveal an elevated white count of 19,100 with lactic acid of 2.54. Patient having a history of malignancy and on chronic immunosuppressive therapy having a high risk for infection. He will started on broad-spectrum IV antimicrobial therapy with Zosyn and vancomycin. On the following morning his diet was advanced. I spoke with oncology who recommended transfusing 2 units of packed red blood cells and stopping IV vancomycin and zosyn and starting levaquin. Repeat CXR on 08/18/2014 showing increased bibasilar atelectasis versus infiltrates. On 08/19/2014 white count increased to 27,200 as patient reported feeling poorly. I restarted broad spectrum IV AB therapy with Vanc and cefepime. Checking a repeat CXR.   Assessment/Plan: 1. Mantle cell lymphoma -Patient currently under the care of Dr. Alvy Bimler, has history of advanced/recurrent mantle cell lymphoma. Second line therapy was discussed with patient however goal of treatment would be palliative. -He remains on prednisone 60 mg by mouth daily, with oncology recommending ibrutinib during this hospitalization. -Hg stable   2.  Suspected healthcare associated pneumonia -Patient clinically deteriorating on 08/19/2014 as labs showed an upward trend in white count to 27,200 from 21,500 day prior -CXR on 08/18/2014 showed increasing bibasilar atelectasis vs infiltrates Repeat chest x-ray shows left lower lobe  atelectasis versus bronchopneumonia -Blood cultures from 08/16/2014 and 1/17, no growth so far, discontinue vancomycin,  Continue cefepime, according to oncology he should only receive a total of 7 days of treatment   3.  Acute respiratory failure -Improving-To be related to an underlying infectious process although initial chest x-ray did not show evidence of infiltrate    4.  Sepsis -Present on admission, evidenced by a white count of 19,000, respiratory rate of 31, lactic acid of 2.54 -Suspect source of infection to be pneumonia given patient's complaints of shortness of breath -White count trending up secondary to steroids Follow blood culture closely   5.  Anemia in neoplastic disease -Patient does not appear to have evidence of GI bleed Hb was 7.1 on admission, received 4 units of blood. Today, Hb is 10.6 Plan to keep hemoglobin >8g  6.  Thrombocytopenia Follow-up platelets on a daily basis Will observe for now. Bactrim placed on hold since 1/11 This will drop while on Ibrutinib.  As long as his platelet count is above 20,000, do not stop treatment  Code Status: Full code Family Communication:  Disposition Plan: As per oncology discharge is anticipated Wednesday  Consultants:  Oncology   Antibiotics:  Levaquin stopped on 08/19/2014  Vancomycin restarted on 08/19/2014  Cefepime started on on 08/19/2014   HPI/Subjective: Patient very upset about not receiving his medications Appetite decreased due to pain. Denies nausea or vomiting, diarrhea or constipation. Denies fevers or chills. No confusion is reported.  Objective: Filed Vitals:   08/21/14 0631  BP: 158/81  Pulse: 56  Temp: 97.8 F (36.6 C)  Resp: 20    Intake/Output Summary (Last 24 hours) at 08/21/14 1123 Last data filed at 08/21/14 0640  Gross per 24 hour  Intake    410  ml  Output   2000 ml  Net  -1590 ml   Filed Weights   08/16/14 2000 08/17/14 1123  Weight: 92.4 kg (203 lb 11.3 oz)  93.305 kg (205 lb 11.2 oz)    Exam:   General: Patient appears ill reporting not feeling well, change from yesterday's exam  Cardiovascular: Tachycardic, regular rate and rhythm normal S1-S2  Respiratory: Tachypneic, lungs appear to be clear auscultation, no wheezes or rales  Abdomen: Soft nontender nondistended  Musculoskeletal: no edema  Data Reviewed: Basic Metabolic Panel:  Recent Labs Lab 08/16/14 0744 08/17/14 0510 08/18/14 0500 08/20/14 0738 08/21/14 0455  NA 143 140 138 142 144  K 3.8 3.6 4.1 4.0 3.7  CL 112 111 108 113* 113*  CO2 20 20 21 20 23   GLUCOSE 141* 110* 174* 111* 102*  BUN 21 21 21  29* 33*  CREATININE 1.52* 1.57* 1.26 1.19 1.36*  CALCIUM 8.8 7.9* 8.2* 8.8 8.8   Liver Function Tests:  Recent Labs Lab 08/16/14 0744 08/21/14 0455  AST 29 18  ALT 21 20  ALKPHOS 80 67  BILITOT 0.6 0.9  PROT 5.7* 5.5*  ALBUMIN 3.2* 3.1*   No results for input(s): LIPASE, AMYLASE in the last 168 hours. No results for input(s): AMMONIA in the last 168 hours. CBC:  Recent Labs Lab 08/16/14 0744  08/17/14 0510 08/18/14 0500 08/19/14 0549 08/20/14 0738 08/21/14 0455  WBC 18.0*  < > 17.5* 21.5* 27.2* 34.9* 25.1*  NEUTROABS 5.8  --   --   --  5.4 3.1 4.0  HGB 7.1*  < > 7.0* 9.1* 9.4* 11.2* 10.6*  HCT 21.4*  < > 20.3* 26.4* 28.1* 32.7* 31.6*  MCV 91.8  < > 89.8 89.8 91.5 90.6 91.6  PLT 80*  < > 45* 36* 46* 57* 57*  < > = values in this interval not displayed. Cardiac Enzymes: No results for input(s): CKTOTAL, CKMB, CKMBINDEX, TROPONINI in the last 168 hours. BNP (last 3 results) No results for input(s): PROBNP in the last 8760 hours. CBG: No results for input(s): GLUCAP in the last 168 hours.  Recent Results (from the past 240 hour(s))  Blood Culture (routine x 2)     Status: None (Preliminary result)   Collection Time: 08/16/14  7:44 AM  Result Value Ref Range Status   Specimen Description BLOOD RIGHT ANTECUBITAL  Final   Special Requests BOTTLES  DRAWN AEROBIC AND ANAEROBIC 4CC  Final   Culture   Final           BLOOD CULTURE RECEIVED NO GROWTH TO DATE CULTURE WILL BE HELD FOR 5 DAYS BEFORE ISSUING A FINAL NEGATIVE REPORT Performed at [facility]    Report Status PENDING  Incomplete  Blood Culture (routine x 2)     Status: None (Preliminary result)   Collection Time: 08/16/14  7:44 AM  Result Value Ref Range Status   Specimen Description BLOOD LEFT FOREARM  Final   Special Requests BOTTLES DRAWN AEROBIC ONLY 2ML  Final   Culture   Final           BLOOD CULTURE RECEIVED NO GROWTH TO DATE CULTURE WILL BE HELD FOR 5 DAYS BEFORE ISSUING A FINAL NEGATIVE REPORT Performed at [facility]    Report Status PENDING  Incomplete  Urine culture     Status: None   Collection Time: 08/16/14 10:10 AM  Result Value Ref Range Status   Specimen Description URINE, CATHETERIZED  Final   Special Requests NONE  Final   Colony Count NO GROWTH Performed at [facility]   Final   Culture NO GROWTH Performed at [facility]   Final   Report Status 08/17/2014 FINAL  Final  Culture, blood (routine x 2)     Status: None (Preliminary result)   Collection Time: 08/16/14  3:07 PM  Result Value Ref Range Status   Specimen Description BLOOD RIGHT ARM  Final   Special Requests BOTTLES DRAWN AEROBIC AND ANAEROBIC 5ML  Final   Culture   Final           BLOOD CULTURE RECEIVED NO GROWTH TO DATE CULTURE WILL BE HELD FOR 5 DAYS BEFORE ISSUING A FINAL NEGATIVE REPORT Performed at [facility]    Report Status PENDING  Incomplete  Culture, blood (routine x 2)     Status: None (Preliminary result)   Collection Time: 08/16/14  3:08 PM  Result Value Ref Range Status   Specimen Description BLOOD LEFT HAND  Final   Special Requests BOTTLES DRAWN AEROBIC AND ANAEROBIC 4ML AND 3ML  Final   Culture   Final           BLOOD CULTURE RECEIVED NO GROWTH TO DATE CULTURE WILL BE HELD FOR 5 DAYS BEFORE ISSUING A FINAL NEGATIVE  REPORT Performed at [facility]    Report Status PENDING  Incomplete  MRSA PCR Screening     Status: None   Collection Time: 08/16/14  6:11 PM  Result Value Ref Range Status   MRSA by PCR NEGATIVE NEGATIVE Final    Comment:        The GeneXpert MRSA Assay (FDA approved for NASAL specimens only), is one component of a comprehensive MRSA colonization surveillance program. It is not intended to diagnose MRSA infection nor to guide or monitor treatment for MRSA infections.   Culture, blood (routine x 2)     Status: None (Preliminary result)   Collection Time: 08/19/14  3:17 PM  Result Value Ref Range Status   Specimen Description BLOOD RIGHT ARM  Final   Special Requests BOTTLES DRAWN AEROBIC ONLY 5CC  Final   Culture   Final           BLOOD CULTURE RECEIVED NO GROWTH TO DATE CULTURE WILL BE HELD FOR 5 DAYS BEFORE ISSUING A FINAL NEGATIVE REPORT Performed at [facility]    Report Status PENDING  Incomplete  Culture, blood (routine x 2)     Status: None (Preliminary result)   Collection Time: 08/19/14  3:27 PM  Result Value Ref Range Status   Specimen Description BLOOD RIGHT HAND  Final   Special Requests   Final    BOTTLES DRAWN AEROBIC AND ANAEROBIC 10CC BOTH BOTTLES   Culture   Final           BLOOD CULTURE RECEIVED NO GROWTH TO DATE CULTURE WILL BE HELD FOR 5 DAYS BEFORE ISSUING A FINAL NEGATIVE REPORT Performed at [facility]    Report Status PENDING  Incomplete     Studies: Dg Chest 2 View  08/19/2014   CLINICAL DATA:  Generalized weakness and syncopal episode at home approximately 3 days ago. Prior history of non-Hodgkin's lymphoma (mantle cell lymphoma) for which he underwent autologous bone marrow transplant approximately 4 months ago. Followup bibasilar atelectasis.  EXAM: CHEST  2 VIEW  COMPARISON:  08/16/2014 dating back to 09/12/2013. PET-CT 07/13/2014. CT chest 08/10/2014.  FINDINGS: Cardiac silhouette normal in size, unchanged. Thoracic  aorta mildly atherosclerotic, unchanged.  Hilar and mediastinal contours otherwise unremarkable. Stable mild streaky and patchy airspace opacities in the left lower lobe since the examination yesterday, new since the examination 2 days ago. No new pulmonary parenchymal abnormality. No visible pleural effusions. Visualized bony thorax intact. Right jugular Port-A-Cath tip in the upper SVC, unchanged.  IMPRESSION: Stable left lower lobe atelectasis versus bronchopneumonia since yesterday. No new abnormality.   Electronically Signed   By: Evangeline Dakin M.D.   On: 08/19/2014 17:08    Scheduled Meds: . acyclovir  800 mg Oral BID  . ceFEPime (MAXIPIME) IV  2 g Intravenous Q12H  . cholecalciferol  2,000 Units Oral q morning - 10a  . famotidine  20 mg Oral BID  . feeding supplement (RESOURCE BREEZE)  1 Container Oral TID BM  . ibrutinib  560 mg Oral Daily  . levothyroxine  75 mcg Oral QAC breakfast  . loratadine  10 mg Oral q morning - 10a  . pantoprazole  40 mg Oral Daily  . predniSONE  60 mg Oral Q breakfast   Continuous Infusions:    Principal Problem:   Sepsis Active Problems:   Mantle cell lymphoma   Hypothyroidism   Anemia in neoplastic disease   Thrombocytopenia   S/P autologous bone marrow transplantation   Dyspnea   Acute respiratory failure with hypoxia   Acute renal failure   History of non-Hodgkin's lymphoma    Time spent: 35 min    [facility]  [facility] Pager [phone]  If 7PM-7AM, please contact night-coverage at [email or web address], password [facility] 08/21/2014, 11:23 AM  LOS: 5 days

## 2014-08-22 LAB — CULTURE, BLOOD (ROUTINE X 2)
CULTURE: NO GROWTH
CULTURE: NO GROWTH
Culture: NO GROWTH
Culture: NO GROWTH

## 2014-08-22 LAB — LACTATE DEHYDROGENASE: LDH: 220 U/L (ref 94–250)

## 2014-08-22 LAB — COMPREHENSIVE METABOLIC PANEL
ALBUMIN: 3.3 g/dL — AB (ref 3.5–5.2)
ALT: 40 U/L (ref 0–53)
AST: 24 U/L (ref 0–37)
Alkaline Phosphatase: 70 U/L (ref 39–117)
Anion gap: 7 (ref 5–15)
BUN: 32 mg/dL — AB (ref 6–23)
CALCIUM: 8.7 mg/dL (ref 8.4–10.5)
CO2: 24 mmol/L (ref 19–32)
CREATININE: 1.24 mg/dL (ref 0.50–1.35)
Chloride: 109 mEq/L (ref 96–112)
GFR calc Af Amer: 65 mL/min — ABNORMAL LOW (ref >90)
GFR calc non Af Amer: 56 mL/min — ABNORMAL LOW (ref >90)
Glucose, Bld: 117 mg/dL — ABNORMAL HIGH (ref 70–99)
POTASSIUM: 4.3 mmol/L (ref 3.5–5.1)
Sodium: 140 mmol/L (ref 135–145)
TOTAL PROTEIN: 5.6 g/dL — AB (ref 6.0–8.3)
Total Bilirubin: 0.7 mg/dL (ref 0.3–1.2)

## 2014-08-22 LAB — CBC
HCT: 34.7 % — ABNORMAL LOW (ref 39.0–52.0)
HEMOGLOBIN: 11.7 g/dL — AB (ref 13.0–17.0)
MCH: 30.8 pg (ref 26.0–34.0)
MCHC: 33.7 g/dL (ref 30.0–36.0)
MCV: 91.3 fL (ref 78.0–100.0)
PLATELETS: 57 10*3/uL — AB (ref 150–400)
RBC: 3.8 MIL/uL — AB (ref 4.22–5.81)
RDW: 14.8 % (ref 11.5–15.5)
WBC: 19.5 10*3/uL — ABNORMAL HIGH (ref 4.0–10.5)

## 2014-08-22 LAB — URIC ACID: Uric Acid, Serum: 11 mg/dL — ABNORMAL HIGH (ref 4.0–7.8)

## 2014-08-22 MED ORDER — SODIUM CHLORIDE 0.9 % IV SOLN
3.0000 mg | Freq: Once | INTRAVENOUS | Status: AC
  Administered 2014-08-22: 3 mg via INTRAVENOUS
  Filled 2014-08-22: qty 2

## 2014-08-22 MED ORDER — PANTOPRAZOLE SODIUM 40 MG PO TBEC: 40.0000 mg | DELAYED_RELEASE_TABLET | Freq: Every day | ORAL | Status: DC

## 2014-08-22 MED ORDER — PREDNISONE 20 MG PO TABS: 60.0000 mg | ORAL_TABLET | Freq: Every day | ORAL | Status: DC

## 2014-08-22 MED ORDER — LEVOFLOXACIN 500 MG PO TABS
500.0000 mg | ORAL_TABLET | Freq: Every day | ORAL | Status: DC
  Administered 2014-08-22: 500 mg via ORAL
  Filled 2014-08-22: qty 1

## 2014-08-22 MED ORDER — LEVOFLOXACIN 500 MG PO TABS: 500.0000 mg | ORAL_TABLET | Freq: Every day | ORAL | Status: DC

## 2014-08-22 NOTE — Progress Notes (Signed)
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 Patient also found to be in respiratory failure having a low-grade temperature. Initial workup included a chest x-ray which did not reveal acute cardiopulmonary disease. Labs did reveal an elevated white count of 19,100 with lactic acid of 2.54. Patient having a history of malignancy and on chronic immunosuppressive therapy having a high risk for infection. He will started on broad-spectrum IV antimicrobial therapy with Zosyn and vancomycin. On the following morning his diet was advanced. I spoke with oncology who recommended transfusing 2 units of packed red blood cells and stopping IV vancomycin and zosyn and starting levaquin. Repeat CXR on 08/18/2014 showing increased bibasilar atelectasis versus infiltrates. On 08/19/2014 white count increased to 27,200 as patient reported feeling poorly. I restarted broad spectrum IV AB therapy with Vanc and cefepime. Checking a repeat CXR.   Assessment/Plan: 1. Mantle cell lymphoma -Patient currently under the care of Dr. Alvy Bimler, has history of advanced/recurrent mantle cell lymphoma. Second line therapy was discussed with patient however goal of treatment would be palliative. -He remains on prednisone 60 mg by and ibrutinib during this hospitalization. -Hg stable   2.  Suspected healthcare associated pneumonia -Patient clinically deteriorating on 08/19/2014 as labs showed an upward trend in white count to 27,200 from 21,500 day prior . Leukocytosis  caused by ibrutinib  -CXR on 08/18/2014 showed increasing bibasilar atelectasis vs infiltrates Repeat chest x-ray shows left lower  lobe atelectasis versus bronchopneumonia -Blood cultures from 08/16/2014 and 1/17, no growth so far,  according to oncology he should only receive a total of 7 days of treatment Will discontinue all IV antibiotics. Levofloxacin 500 mg 2 days then discontinue   3.  Acute respiratory failure-resolved -Improving-To be related to an underlying infectious process although initial chest x-ray did not show evidence of infiltrate  4.  Sepsis-resolved -Present on admission, evidenced by a white count of 19,000, respiratory rate of 31, lactic acid of 2.54 -Suspect source of infection to be pneumonia given patient's complaints of shortness of breath All cultures negative today    5.  Anemia in neoplastic disease -Patient does not appear to have evidence of GI bleed Hb was 7.1 on admission, received 4 units of blood. Today, Hb is 11.7 Plan to keep hemoglobin >8g   6.  Thrombocytopenia secondary to chemotherapy Platelet count stable for the last 3 days Will observe for now. Bactrim placed on hold since 1/11 Thrombocytopenia caused by Ibrutinib.  As long as his platelet count is above 20,000, do not stop treatment   Code Status: Full code Family Communication:  Disposition Plan: As per oncology discharge is anticipated 1/21  Consultants:  Oncology   Antibiotics:  Levaquin stopped on 08/19/2014  Vancomycin restarted on 08/19/2014  Cefepime started on on 08/19/2014   HPI/Subjective: Patient ambulating the room, complaining of left upper quadrant pain around the spleen, denies any nausea vomiting  Objective: Filed Vitals:   08/22/14 0557  BP: 133/80  Pulse: 55  Temp: 97.6 F (36.4 C)  Resp: 20    Intake/Output Summary (Last 24 hours) at 08/22/14 1043 Last data filed at 08/22/14 0838  Gross per 24 hour  Intake    700 ml  Output  850 ml  Net   -150 ml   Filed Weights   08/16/14 2000 08/17/14 1123  Weight: 92.4 kg (203 lb 11.3 oz) 93.305 kg (205 lb 11.2 oz)     Exam:   General: Patient appears ill reporting not feeling well, change from yesterday's exam  Cardiovascular: Tachycardic, regular rate and rhythm normal S1-S2  Respiratory: Tachypneic, lungs appear to be clear auscultation, no wheezes or rales  Abdomen: Soft nontender nondistended  Musculoskeletal: no edema  Data Reviewed: Basic Metabolic Panel:  Recent Labs Lab 08/17/14 0510 08/18/14 0500 08/20/14 0738 08/21/14 0455 08/22/14 0406  NA 140 138 142 144 140  K 3.6 4.1 4.0 3.7 4.3  CL 111 108 113* 113* 109  CO2 20 21 20 23 24   GLUCOSE 110* 174* 111* 102* 117*  BUN 21 21 29* 33* 32*  CREATININE 1.57* 1.26 1.19 1.36* 1.24  CALCIUM 7.9* 8.2* 8.8 8.8 8.7   Liver Function Tests:  Recent Labs Lab 08/16/14 0744 08/21/14 0455 08/22/14 0406  AST 29 18 24   ALT 21 20 40  ALKPHOS 80 67 70  BILITOT 0.6 0.9 0.7  PROT 5.7* 5.5* 5.6*  ALBUMIN 3.2* 3.1* 3.3*   No results for input(s): LIPASE, AMYLASE in the last 168 hours. No results for input(s): AMMONIA in the last 168 hours. CBC:  Recent Labs Lab 08/16/14 0744  08/18/14 0500 08/19/14 0549 08/20/14 0738 08/21/14 0455 08/22/14 0406  WBC 18.0*  < > 21.5* 27.2* 34.9* 25.1* 19.5*  NEUTROABS 5.8  --   --  5.4 3.1 4.0  --   HGB 7.1*  < > 9.1* 9.4* 11.2* 10.6* 11.7*  HCT 21.4*  < > 26.4* 28.1* 32.7* 31.6* 34.7*  MCV 91.8  < > 89.8 91.5 90.6 91.6 91.3  PLT 80*  < > 36* 46* 57* 57* 57*  < > = values in this interval not displayed. Cardiac Enzymes: No results for input(s): CKTOTAL, CKMB, CKMBINDEX, TROPONINI in the last 168 hours. BNP (last 3 results) No results for input(s): PROBNP in the last 8760 hours. CBG: No results for input(s): GLUCAP in the last 168 hours.  Recent Results (from the past 240 hour(s))  Blood Culture (routine x 2)     Status: None   Collection Time: 08/16/14  7:44 AM  Result Value Ref Range Status   Specimen Description BLOOD RIGHT ANTECUBITAL  Final   Special Requests BOTTLES DRAWN  AEROBIC AND ANAEROBIC 4CC  Final   Culture   Final    NO GROWTH 5 DAYS Performed at [facility]    Report Status 08/22/2014 FINAL  Final  Blood Culture (routine x 2)     Status: None   Collection Time: 08/16/14  7:44 AM  Result Value Ref Range Status   Specimen Description BLOOD LEFT FOREARM  Final   Special Requests BOTTLES DRAWN AEROBIC ONLY 2ML  Final   Culture   Final    NO GROWTH 5 DAYS Performed at [facility]    Report Status 08/22/2014 FINAL  Final  Urine culture     Status: None   Collection Time: 08/16/14 10:10 AM  Result Value Ref Range Status   Specimen Description URINE, CATHETERIZED  Final   Special Requests NONE  Final   Colony Count NO GROWTH Performed at [facility]   Final   Culture NO GROWTH Performed at [facility]   Final   Report Status 08/17/2014 FINAL  Final  Culture, blood (routine x 2)  Status: None   Collection Time: 08/16/14  3:07 PM  Result Value Ref Range Status   Specimen Description BLOOD RIGHT ARM  Final   Special Requests BOTTLES DRAWN AEROBIC AND ANAEROBIC 5ML  Final   Culture   Final    NO GROWTH 5 DAYS Performed at [facility]    Report Status 08/22/2014 FINAL  Final  Culture, blood (routine x 2)     Status: None   Collection Time: 08/16/14  3:08 PM  Result Value Ref Range Status   Specimen Description BLOOD LEFT HAND  Final   Special Requests BOTTLES DRAWN AEROBIC AND ANAEROBIC 4ML AND 3ML  Final   Culture   Final    NO GROWTH 5 DAYS Performed at [facility]    Report Status 08/22/2014 FINAL  Final  MRSA PCR Screening     Status: None   Collection Time: 08/16/14  6:11 PM  Result Value Ref Range Status   MRSA by PCR NEGATIVE NEGATIVE Final    Comment:        The GeneXpert MRSA Assay (FDA approved for NASAL specimens only), is one component of a comprehensive MRSA colonization surveillance program. It is not intended to diagnose MRSA infection nor to guide  or monitor treatment for MRSA infections.   Culture, blood (routine x 2)     Status: None (Preliminary result)   Collection Time: 08/19/14  3:17 PM  Result Value Ref Range Status   Specimen Description BLOOD RIGHT ARM  Final   Special Requests BOTTLES DRAWN AEROBIC ONLY 5CC  Final   Culture   Final           BLOOD CULTURE RECEIVED NO GROWTH TO DATE CULTURE WILL BE HELD FOR 5 DAYS BEFORE ISSUING A FINAL NEGATIVE REPORT Performed at [facility]    Report Status PENDING  Incomplete  Culture, blood (routine x 2)     Status: None (Preliminary result)   Collection Time: 08/19/14  3:27 PM  Result Value Ref Range Status   Specimen Description BLOOD RIGHT HAND  Final   Special Requests   Final    BOTTLES DRAWN AEROBIC AND ANAEROBIC 10CC BOTH BOTTLES   Culture   Final           BLOOD CULTURE RECEIVED NO GROWTH TO DATE CULTURE WILL BE HELD FOR 5 DAYS BEFORE ISSUING A FINAL NEGATIVE REPORT Performed at [facility]    Report Status PENDING  Incomplete     Studies: No results found.  Scheduled Meds: . acyclovir  800 mg Oral BID  . ceFEPime (MAXIPIME) IV  2 g Intravenous Q12H  . cholecalciferol  2,000 Units Oral q morning - 10a  . famotidine  20 mg Oral BID  . feeding supplement (RESOURCE BREEZE)  1 Container Oral TID BM  . ibrutinib  560 mg Oral Daily  . levothyroxine  75 mcg Oral QAC breakfast  . loratadine  10 mg Oral q morning - 10a  . pantoprazole  40 mg Oral Daily  . predniSONE  60 mg Oral Q breakfast  . rasburicase (ELITEK) IV infusion  3 mg Intravenous Once   Continuous Infusions:    Principal Problem:   Sepsis Active Problems:   Mantle cell lymphoma   Hypothyroidism   Anemia in neoplastic disease   Thrombocytopenia   S/P autologous bone marrow transplantation   Dyspnea   Acute respiratory failure with hypoxia   Acute renal failure   History of non-Hodgkin's lymphoma  Time spent: 35 min    [facility]  [facility] Pager [phone]   If 7PM-7AM, please contact night-coverage at [email or web address], password [facility] 08/22/2014, 10:43 AM  LOS: 6 days

## 2014-08-22 NOTE — Progress Notes (Signed)
Manuel [name]   DOB:November 17, 1940   NL#:[id number]   I have seen the patient, examined him and edited the notes as follows   Subjective:He reports feeling better this morning. Denies cough. Shortness of breath has improved since he received blood transfusion. His left abdominal pain due to splenomegaly is better controlled. Appetite is improved. Denies nausea or vomiting, diarrhea or constipation. Denies fevers or chills. No confusion is reported.  Scheduled Meds: . acyclovir  800 mg Oral BID  . ceFEPime (MAXIPIME) IV  2 g Intravenous Q12H  . cholecalciferol  2,000 Units Oral q morning - 10a  . famotidine  20 mg Oral BID  . feeding supplement (RESOURCE BREEZE)  1 Container Oral TID BM  . ibrutinib  560 mg Oral Daily  . levothyroxine  75 mcg Oral QAC breakfast  . loratadine  10 mg Oral q morning - 10a  . pantoprazole  40 mg Oral Daily  . predniSONE  60 mg Oral Q breakfast   Continuous Infusions:  PRN Meds:.acetaminophen, albuterol, hydrALAZINE, ondansetron **OR** ondansetron (ZOFRAN) IV, oxyCODONE Objective:  Filed Vitals:   08/22/14 0557  BP: 133/80  Pulse: 55  Temp: 97.6 F (36.4 C)  Resp: 20     Intake/Output Summary (Last 24 hours) at 08/22/14 0732 Last data filed at 08/22/14 4081  Gross per 24 hour  Intake    700 ml  Output    850 ml  Net   -150 ml    GENERAL:alert, no distress and comfortable SKIN: skin color, texture, turgor are normal, no rashes or significant lesions EYES: normal, Conjunctiva are pink and non-injected, sclera clear OROPHARYNX:no exudate, no erythema and lips, buccal mucosa, and tongue normal  NECK: supple, thyroid normal size, non-tender, without nodularity LYMPH:  no palpable lymphadenopathy in the cervical, axillary or inguinal LUNGS: clear to auscultation and percussion with normal breathing effort HEART: regular rate & rhythm and no murmurs and no lower extremity edema ABDOMEN:abdomen soft, tender on the left upper quadrant towards the lower  quadrant, and normal bowel sounds. Splenomegaly is present Musculoskeletal:no cyanosis of digits and no clubbing  NEURO: alert & oriented x 3 with fluent speech, no focal motor/sensory deficits   Labs:  Lab Results  Component Value Date   WBC 19.5* 08/22/2014   HGB 11.7* 08/22/2014   HCT 34.7* 08/22/2014   MCV 91.3 08/22/2014   PLT 57* 08/22/2014   NEUTROABS 4.0 08/21/2014    Lab Results  Component Value Date   NA 140 08/22/2014   K 4.3 08/22/2014   CL 109 08/22/2014   CO2 24 08/22/2014    Studies No new x rays today  Assessment & Plan:   Mantle cell lymphoma Unfortunately, the patient have high disease burden. Second line treatment for recurrent mantle cell lymphoma was discussed on 08/13/14. Intent is palliative.  He was started on 60 mg of prednisone daily last week. Plan to taper slowly as outpatient He has continued to receive ibrutinib in hospital.  Suspect it has started to work as the patient has resolution of bilateral shoulder pain, which is suspected to be referred from splenomegaly  Anemia in neoplastic disease This is likely anemia of chronic disease, splenomegaly, malignancy.  The patient denies recent history of bleeding such as epistaxis, hematuria or hematochezia.  Hb was 7.1 on admission, received 4 units of blood. Today, Hb is 11.7 Plan to keep hemoglobin >8g  Thrombocytopenia This, along with splenomegaly is highly suggestive of possible disease recurrence. The patient denies recent history of bleeding such  as epistaxis, hematuria or hematochezia.  He is asymptomatic from the thrombocytopenia. Will observe for now. Bactrim placed on hold since 1/11 This will drop while on Ibrutinib, today at 57,000 As long as his platelet count is above 20,000, do not stop treatment  Abdominal pain This is due to splenomegaly in the setting of malignancy CT of the abdomen and pelvis on 1/8 showed significant increase in the size and volume of the spleen  with development of a low-attenuation lesion in the posterior subcapsular spleen. Pain is controlled with prn meds Will monitor for now.  Leukocytosis Fever, resolved This is due to Prednisone, inflammation, pain, malignancy, possible infection, Ibrutinib Cultures are negative Switched to Maxipime. Vanco was discontinued on 1/18 Will continue to monitor, today at 19.5  Bilateral shoulder pain, resolved This is likely related to referred pain. On oxycodone to take as needed.  Nausea with vomiting, resolved Suspect this is due to sensation of gastritis from his enlarged spleen causing increased reflux. Continue Prilosec and Zantac   Hypertension Likely due to fluid overload. Recommend gentle diuresis  Malnutrition His appetite is improving since admission  Weakness We'll refer him to PT for evaluation prior to discharge home  Acute renal failure, resolved Continue nutritional supplements. IVF is discontinued  Abnormal chest x-ray He is on antibiotics. Recommend incentive spirometry. This could be due to atelectasis  Discharge planning Hopefully he can be discharged today. He has appointment to see me back on 08/28/14  Other medical issues as per admitting team   [facility] E, PA-C 08/22/2014  7:32 AM Jourdyn Hasler, MD 08/22/2014

## 2014-08-22 NOTE — Discharge Summary (Signed)
Physician Discharge Summary  Manuel [name] MRN: [id number] DOB/AGE: [birth year] 73 y.o.  PCP: Pcp Not In System   Admit date: 08/16/2014 Discharge date: 08/22/2014  Discharge Diagnoses:   Principal Problem:   Sepsis Active Problems:   Mantle cell lymphoma   Hypothyroidism   Anemia in neoplastic disease   Thrombocytopenia   S/P autologous bone marrow transplantation   Dyspnea   Acute respiratory failure with hypoxia   Acute renal failure   History of non-Hodgkin's lymphoma     Medication List    STOP taking these medications        omeprazole 40 MG capsule  Commonly known as:  PRILOSEC  Replaced by:  pantoprazole 40 MG tablet     ranitidine 150 MG tablet  Commonly known as:  ZANTAC      TAKE these medications        acetaminophen 500 MG tablet  Commonly known as:  TYLENOL  Take 500 mg by mouth every 6 (six) hours as needed for moderate pain or fever.     acyclovir 800 MG tablet  Commonly known as:  ZOVIRAX  Take 800 mg by mouth 2 (two) times daily.     cholecalciferol 1000 UNITS tablet  Commonly known as:  VITAMIN D  Take 2,000 Units by mouth every morning.     ibrutinib 140 MG capsul  Commonly known as:  IMBRUVICA  Take 4 capsules (560 mg total) by mouth daily.     levofloxacin 500 MG tablet  Commonly known as:  LEVAQUIN  Take 1 tablet (500 mg total) by mouth daily.     levothyroxine 75 MCG tablet  Commonly known as:  SYNTHROID, LEVOTHROID  Take 1 tablet (75 mcg total) by mouth daily.     loratadine 10 MG tablet  Commonly known as:  CLARITIN  Take 10 mg by mouth every morning.     Multiple Vitamin tablet  Take 1 tablet by mouth.     oxyCODONE 5 MG immediate release tablet  Commonly known as:  Oxy IR/ROXICODONE  Take 1 tablet (5 mg total) by mouth every 4 (four) hours as needed for severe pain.     pantoprazole 40 MG tablet  Commonly known as:  PROTONIX  Take 1 tablet (40 mg total) by mouth daily.     polyethylene glycol packet   Commonly known as:  MIRALAX / GLYCOLAX  Take 17 g by mouth daily as needed.     predniSONE 20 MG tablet  Commonly known as:  DELTASONE  Take 3 tablets (60 mg total) by mouth daily with breakfast.         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 Patient also found to be in respiratory failure having a low-grade temperature. Initial workup included a chest x-ray which did not reveal acute cardiopulmonary disease. Labs did reveal an elevated white count of 19,100 with lactic acid of 2.54. Patient having a history of malignancy and on chronic immunosuppressive therapy having a high risk for infection. He will started on broad-spectrum IV antimicrobial therapy with Zosyn and vancomycin. On the following morning his diet was advanced. I spoke with oncology who recommended transfusing 2 units of packed red blood cells and stopping IV vancomycin and zosyn and starting levaquin. Repeat CXR on 08/18/2014 showing increased bibasilar  atelectasis versus infiltrates. On 08/19/2014 white count increased to 27,200 as patient reported feeling poorly. I restarted broad spectrum IV AB therapy with Vanc and cefepime. Checking a repeat CXR.   Assessment/Plan: 1. Mantle cell lymphoma -Patient currently under the care of Dr. Alvy Bimler, has history of advanced/recurrent mantle cell lymphoma. Second line therapy was discussed with patient however goal of treatment would be palliative. -He remains on prednisone 60 mg by and ibrutinib during this hospitalization. -Hg stable   2.  Suspected healthcare associated pneumonia -Patient clinically deteriorating on 08/19/2014 as labs showed an upward trend in white count to 27,200 from 21,500 day prior . Leukocytosis  caused by ibrutinib  -CXR on 08/18/2014  showed increasing bibasilar atelectasis vs infiltrates Repeat chest x-ray shows left lower lobe atelectasis versus bronchopneumonia -Blood cultures from 08/16/2014 and 1/17, no growth so far,  according to oncology he should only receive a total of 7 days of treatment Will discontinue all IV antibiotics. Levofloxacin 500 mg 2 days then discontinue   3.  Acute respiratory failure-resolved -Improving-To be related to an underlying infectious process although initial chest x-ray did not show evidence of infiltrate  4.  Sepsis-resolved -Present on admission, evidenced by a white count of 19,000, respiratory rate of 31, lactic acid of 2.54 -Suspect source of infection to be pneumonia given patient's complaints of shortness of breath All cultures negative today    5.  Anemia in neoplastic disease -Patient does not appear to have evidence of GI bleed Hb was 7.1 on admission, received 4 units of blood. Today, Hb is 11.7 Plan to keep hemoglobin >8g   6.  Thrombocytopenia secondary to chemotherapy Platelet count stable for the last 3 days Will observe for now. Bactrim placed on hold since 1/11 Thrombocytopenia caused by Ibrutinib.  As long as his platelet count is above 20,000, do not stop treatment   Code Status: Full code Family Communication:  Disposition Plan: As per oncology discharge is anticipated 1/20  Consultants:  Oncology   Antibiotics:  Levaquin stopped on 08/19/2014  Vancomycin restarted on 08/19/2014  Cefepime started on on 08/19/2014   HPI/Subjective: Patient ambulating the room, complaining of left upper quadrant pain around the spleen, denies any nausea vomiting  Objective: Filed Vitals:   08/22/14 1412  BP: 118/62  Pulse: 68  Temp: 97.9 F (36.6 C)  Resp: 18    Intake/Output Summary (Last 24 hours) at 08/22/14 1612 Last data filed at 08/22/14 0838  Gross per 24 hour  Intake    460 ml  Output    850 ml  Net   -390 ml   Filed Weights   08/16/14  2000 08/17/14 1123  Weight: 92.4 kg (203 lb 11.3 oz) 93.305 kg (205 lb 11.2 oz)    Exam:   General: Patient appears ill reporting not feeling well, change from yesterday's exam  Cardiovascular: Tachycardic, regular rate and rhythm normal S1-S2  Respiratory: Tachypneic, lungs appear to be clear auscultation, no wheezes or rales  Abdomen: Soft nontender nondistended  Musculoskeletal: no edema  Data Reviewed: Basic Metabolic Panel:  Recent Labs Lab 08/17/14 0510 08/18/14 0500 08/20/14 0738 08/21/14 0455 08/22/14 0406  NA 140 138 142 144 140  K 3.6 4.1 4.0 3.7 4.3  CL 111 108 113* 113* 109  CO2 20 21 20 23 24   GLUCOSE 110* 174* 111* 102* 117*  BUN 21 21 29* 33* 32*  CREATININE 1.57* 1.26 1.19 1.36* 1.24  CALCIUM 7.9* 8.2* 8.8 8.8 8.7   Liver Function Tests:  Recent Labs Lab 08/16/14 0744 08/21/14 0455 08/22/14 0406  AST 29 18 24   ALT 21 20 40  ALKPHOS 80 67 70  BILITOT 0.6 0.9 0.7  PROT 5.7* 5.5* 5.6*  ALBUMIN 3.2* 3.1* 3.3*   No results for input(s): LIPASE, AMYLASE in the last 168 hours. No results for input(s): AMMONIA in the last 168 hours. CBC:  Recent Labs Lab 08/16/14 0744  08/18/14 0500 08/19/14 0549 08/20/14 0738 08/21/14 0455 08/22/14 0406  WBC 18.0*  < > 21.5* 27.2* 34.9* 25.1* 19.5*  NEUTROABS 5.8  --   --  5.4 3.1 4.0  --   HGB 7.1*  < > 9.1* 9.4* 11.2* 10.6* 11.7*  HCT 21.4*  < > 26.4* 28.1* 32.7* 31.6* 34.7*  MCV 91.8  < > 89.8 91.5 90.6 91.6 91.3  PLT 80*  < > 36* 46* 57* 57* 57*  < > = values in this interval not displayed. Cardiac Enzymes: No results for input(s): CKTOTAL, CKMB, CKMBINDEX, TROPONINI in the last 168 hours. BNP (last 3 results) No results for input(s): PROBNP in the last 8760 hours. CBG: No results for input(s): GLUCAP in the last 168 hours.  Recent Results (from the past 240 hour(s))  Blood Culture (routine x 2)     Status: None   Collection Time: 08/16/14  7:44 AM  Result Value Ref Range Status   Specimen  Description BLOOD RIGHT ANTECUBITAL  Final   Special Requests BOTTLES DRAWN AEROBIC AND ANAEROBIC 4CC  Final   Culture   Final    NO GROWTH 5 DAYS Performed at [facility]    Report Status 08/22/2014 FINAL  Final  Blood Culture (routine x 2)     Status: None   Collection Time: 08/16/14  7:44 AM  Result Value Ref Range Status   Specimen Description BLOOD LEFT FOREARM  Final   Special Requests BOTTLES DRAWN AEROBIC ONLY 2ML  Final   Culture   Final    NO GROWTH 5 DAYS Performed at [facility]    Report Status 08/22/2014 FINAL  Final  Urine culture     Status: None   Collection Time: 08/16/14 10:10 AM  Result Value Ref Range Status   Specimen Description URINE, CATHETERIZED  Final   Special Requests NONE  Final   Colony Count NO GROWTH Performed at [facility]   Final   Culture NO GROWTH Performed at [facility]   Final   Report Status 08/17/2014 FINAL  Final  Culture, blood (routine x 2)     Status: None   Collection Time: 08/16/14  3:07 PM  Result Value Ref Range Status   Specimen Description BLOOD RIGHT ARM  Final   Special Requests BOTTLES DRAWN AEROBIC AND ANAEROBIC 5ML  Final   Culture   Final    NO GROWTH 5 DAYS Performed at [facility]    Report Status 08/22/2014 FINAL  Final  Culture, blood (routine x 2)     Status: None   Collection Time: 08/16/14  3:08 PM  Result Value Ref Range Status   Specimen Description BLOOD LEFT HAND  Final   Special Requests BOTTLES DRAWN AEROBIC AND ANAEROBIC 4ML AND 3ML  Final   Culture   Final    NO GROWTH 5 DAYS Performed at [facility]    Report Status 08/22/2014 FINAL  Final  MRSA PCR Screening     Status: None   Collection Time: 08/16/14  6:11 PM  Result Value Ref  Range Status   MRSA by PCR NEGATIVE NEGATIVE Final    Comment:        The GeneXpert MRSA Assay (FDA approved for NASAL specimens only), is one component of a comprehensive MRSA colonization surveillance  program. It is not intended to diagnose MRSA infection nor to guide or monitor treatment for MRSA infections.   Culture, blood (routine x 2)     Status: None (Preliminary result)   Collection Time: 08/19/14  3:17 PM  Result Value Ref Range Status   Specimen Description BLOOD RIGHT ARM  Final   Special Requests BOTTLES DRAWN AEROBIC ONLY 5CC  Final   Culture   Final           BLOOD CULTURE RECEIVED NO GROWTH TO DATE CULTURE WILL BE HELD FOR 5 DAYS BEFORE ISSUING A FINAL NEGATIVE REPORT Performed at [facility]    Report Status PENDING  Incomplete  Culture, blood (routine x 2)     Status: None (Preliminary result)   Collection Time: 08/19/14  3:27 PM  Result Value Ref Range Status   Specimen Description BLOOD RIGHT HAND  Final   Special Requests   Final    BOTTLES DRAWN AEROBIC AND ANAEROBIC 10CC BOTH BOTTLES   Culture   Final           BLOOD CULTURE RECEIVED NO GROWTH TO DATE CULTURE WILL BE HELD FOR 5 DAYS BEFORE ISSUING A FINAL NEGATIVE REPORT Performed at [facility]    Report Status PENDING  Incomplete     Studies: No results found.  Scheduled Meds: . acyclovir  800 mg Oral BID  . cholecalciferol  2,000 Units Oral q morning - 10a  . famotidine  20 mg Oral BID  . feeding supplement (RESOURCE BREEZE)  1 Container Oral TID BM  . ibrutinib  560 mg Oral Daily  . levofloxacin  500 mg Oral Daily  . levothyroxine  75 mcg Oral QAC breakfast  . loratadine  10 mg Oral q morning - 10a  . pantoprazole  40 mg Oral Daily  . predniSONE  60 mg Oral Q breakfast   Continuous Infusions:    Principal Problem:   Sepsis Active Problems:   Mantle cell lymphoma   Hypothyroidism   Anemia in neoplastic disease   Thrombocytopenia   S/P autologous bone marrow transplantation   Dyspnea   Acute respiratory failure with hypoxia   Acute renal failure   History of non-Hodgkin's lymphoma    Time spent: 35 min    [facility]  [facility] Pager [phone]   If 7PM-7AM, please contact night-coverage at [email or web address], password [facility] 08/22/2014, 4:12 PM  LOS: 6 days

## 2014-08-22 NOTE — Progress Notes (Deleted)
[facility] DISCHARGE SUMMARY  Manuel [name] EXB:[id number] DOB: 1941-06-10 DOA: 08/16/2014 PCP: Pcp Not In System   Medication List    STOP taking these medications        omeprazole 40 MG capsule  Commonly known as:  PRILOSEC  Replaced by:  pantoprazole 40 MG tablet     ranitidine 150 MG tablet  Commonly known as:  ZANTAC      TAKE these medications        acetaminophen 500 MG tablet  Commonly known as:  TYLENOL  Take 500 mg by mouth every 6 (six) hours as needed for moderate pain or fever.     acyclovir 800 MG tablet  Commonly known as:  ZOVIRAX  Take 800 mg by mouth 2 (two) times daily.     cholecalciferol 1000 UNITS tablet  Commonly known as:  VITAMIN D  Take 2,000 Units by mouth every morning.     ibrutinib 140 MG capsul  Commonly known as:  IMBRUVICA  Take 4 capsules (560 mg total) by mouth daily.     levofloxacin 500 MG tablet  Commonly known as:  LEVAQUIN  Take 1 tablet (500 mg total) by mouth daily.     levothyroxine 75 MCG tablet  Commonly known as:  SYNTHROID, LEVOTHROID  Take 1 tablet (75 mcg total) by mouth daily.     loratadine 10 MG tablet  Commonly known as:  CLARITIN  Take 10 mg by mouth every morning.     Multiple Vitamin tablet  Take 1 tablet by mouth.     oxyCODONE 5 MG immediate release tablet  Commonly known as:  Oxy IR/ROXICODONE  Take 1 tablet (5 mg total) by mouth every 4 (four) hours as needed for severe pain.     pantoprazole 40 MG tablet  Commonly known as:  PROTONIX  Take 1 tablet (40 mg total) by mouth daily.     polyethylene glycol packet  Commonly known as:  MIRALAX / GLYCOLAX  Take 17 g by mouth daily as needed.     predniSONE 20 MG tablet  Commonly known as:  DELTASONE  Take 3 tablets (60 mg total) by mouth daily with breakfast.        Interim Summary Patient is a pleasant 74 year old male with a past medical history of mantle cell lymphoma, status post stem cell transplant in September 2015, admitted to  the medicine service on 08/16/2014 when he presented with complaints of dyspnea and chest pain. Patient also found to be in respiratory failure having a low-grade temperature. Initial workup included a chest x-ray which did not reveal acute cardiopulmonary disease. Labs did reveal an elevated white count of 19,100 with lactic acid of 2.54. Patient having a history of malignancy and on chronic immunosuppressive therapy having a high risk for infection. He will started on broad-spectrum IV antimicrobial therapy with Zosyn and vancomycin. On the following morning his diet was advanced. I spoke with oncology who recommended transfusing 2 units of packed red blood cells and stopping IV vancomycin and zosyn and starting levaquin. Repeat CXR on 08/18/2014 showing increased bibasilar atelectasis versus infiltrates. On 08/19/2014 white count increased to 27,200 as patient reported feeling poorly. I restarted broad spectrum IV AB therapy with Vanc and cefepime. Checking a repeat CXR.   Assessment/Plan: 1. Mantle cell lymphoma -Patient currently under the care of Dr. Alvy Bimler, has history of advanced/recurrent mantle cell lymphoma. Second line therapy was discussed with patient however goal of treatment would be palliative. -He remains on prednisone 60  mg by and ibrutinib during this hospitalization. -Hg stable   2.  Suspected healthcare associated pneumonia -Patient clinically deteriorating on 08/19/2014 as labs showed an upward trend in white count to 27,200 from 21,500 day prior . Leukocytosis  caused by ibrutinib  -CXR on 08/18/2014 showed increasing bibasilar atelectasis vs infiltrates Repeat chest x-ray shows left lower lobe atelectasis versus bronchopneumonia -Blood cultures from 08/16/2014 and 1/17, no growth so far,  according to oncology he should only receive a total of 7 days of treatment Will discontinue all IV antibiotics. Levofloxacin 500 mg 2 days then discontinue   3.  Acute respiratory  failure-resolved -Improving-To be related to an underlying infectious process although initial chest x-ray did not show evidence of infiltrate  4.  Sepsis-resolved -Present on admission, evidenced by a white count of 19,000, respiratory rate of 31, lactic acid of 2.54 -Suspect source of infection to be pneumonia given patient's complaints of shortness of breath All cultures negative today    5.  Anemia in neoplastic disease -Patient does not appear to have evidence of GI bleed Hb was 7.1 on admission, received 4 units of blood. Today, Hb is 11.7 Plan to keep hemoglobin >8g   6.  Thrombocytopenia secondary to chemotherapy Platelet count stable for the last 3 days Will observe for now. Bactrim placed on hold since 1/11 Thrombocytopenia caused by Ibrutinib.  As long as his platelet count is above 20,000, do not stop treatment   Code Status: Full code Family Communication:  Disposition Plan: As per oncology discharge is anticipated 1/21  Consultants:  Oncology   Antibiotics:  Levaquin stopped on 08/19/2014  Vancomycin restarted on 08/19/2014  Cefepime started on on 08/19/2014   HPI/Subjective: Patient ambulating the room, complaining of left upper quadrant pain around the spleen, denies any nausea vomiting  Objective: Filed Vitals:   08/22/14 1412  BP: 118/62  Pulse: 68  Temp: 97.9 F (36.6 C)  Resp: 18    Intake/Output Summary (Last 24 hours) at 08/22/14 1609 Last data filed at 08/22/14 0838  Gross per 24 hour  Intake    460 ml  Output    850 ml  Net   -390 ml   Filed Weights   08/16/14 2000 08/17/14 1123  Weight: 92.4 kg (203 lb 11.3 oz) 93.305 kg (205 lb 11.2 oz)    Exam:   General: Patient appears ill reporting not feeling well, change from yesterday's exam  Cardiovascular: Tachycardic, regular rate and rhythm normal S1-S2  Respiratory: Tachypneic, lungs appear to be clear auscultation, no wheezes or rales  Abdomen: Soft nontender  nondistended  Musculoskeletal: no edema  Data Reviewed: Basic Metabolic Panel:  Recent Labs Lab 08/17/14 0510 08/18/14 0500 08/20/14 0738 08/21/14 0455 08/22/14 0406  NA 140 138 142 144 140  K 3.6 4.1 4.0 3.7 4.3  CL 111 108 113* 113* 109  CO2 20 21 20 23 24   GLUCOSE 110* 174* 111* 102* 117*  BUN 21 21 29* 33* 32*  CREATININE 1.57* 1.26 1.19 1.36* 1.24  CALCIUM 7.9* 8.2* 8.8 8.8 8.7   Liver Function Tests:  Recent Labs Lab 08/16/14 0744 08/21/14 0455 08/22/14 0406  AST 29 18 24   ALT 21 20 40  ALKPHOS 80 67 70  BILITOT 0.6 0.9 0.7  PROT 5.7* 5.5* 5.6*  ALBUMIN 3.2* 3.1* 3.3*   No results for input(s): LIPASE, AMYLASE in the last 168 hours. No results for input(s): AMMONIA in the last 168 hours. CBC:  Recent Labs Lab 08/16/14 0744  08/18/14 0500 08/19/14 0549 08/20/14 0738 08/21/14 0455 08/22/14 0406  WBC 18.0*  < > 21.5* 27.2* 34.9* 25.1* 19.5*  NEUTROABS 5.8  --   --  5.4 3.1 4.0  --   HGB 7.1*  < > 9.1* 9.4* 11.2* 10.6* 11.7*  HCT 21.4*  < > 26.4* 28.1* 32.7* 31.6* 34.7*  MCV 91.8  < > 89.8 91.5 90.6 91.6 91.3  PLT 80*  < > 36* 46* 57* 57* 57*  < > = values in this interval not displayed. Cardiac Enzymes: No results for input(s): CKTOTAL, CKMB, CKMBINDEX, TROPONINI in the last 168 hours. BNP (last 3 results) No results for input(s): PROBNP in the last 8760 hours. CBG: No results for input(s): GLUCAP in the last 168 hours.  Recent Results (from the past 240 hour(s))  Blood Culture (routine x 2)     Status: None   Collection Time: 08/16/14  7:44 AM  Result Value Ref Range Status   Specimen Description BLOOD RIGHT ANTECUBITAL  Final   Special Requests BOTTLES DRAWN AEROBIC AND ANAEROBIC 4CC  Final   Culture   Final    NO GROWTH 5 DAYS Performed at [facility]    Report Status 08/22/2014 FINAL  Final  Blood Culture (routine x 2)     Status: None   Collection Time: 08/16/14  7:44 AM  Result Value Ref Range Status   Specimen Description  BLOOD LEFT FOREARM  Final   Special Requests BOTTLES DRAWN AEROBIC ONLY 2ML  Final   Culture   Final    NO GROWTH 5 DAYS Performed at [facility]    Report Status 08/22/2014 FINAL  Final  Urine culture     Status: None   Collection Time: 08/16/14 10:10 AM  Result Value Ref Range Status   Specimen Description URINE, CATHETERIZED  Final   Special Requests NONE  Final   Colony Count NO GROWTH Performed at [facility]   Final   Culture NO GROWTH Performed at [facility]   Final   Report Status 08/17/2014 FINAL  Final  Culture, blood (routine x 2)     Status: None   Collection Time: 08/16/14  3:07 PM  Result Value Ref Range Status   Specimen Description BLOOD RIGHT ARM  Final   Special Requests BOTTLES DRAWN AEROBIC AND ANAEROBIC 5ML  Final   Culture   Final    NO GROWTH 5 DAYS Performed at [facility]    Report Status 08/22/2014 FINAL  Final  Culture, blood (routine x 2)     Status: None   Collection Time: 08/16/14  3:08 PM  Result Value Ref Range Status   Specimen Description BLOOD LEFT HAND  Final   Special Requests BOTTLES DRAWN AEROBIC AND ANAEROBIC 4ML AND 3ML  Final   Culture   Final    NO GROWTH 5 DAYS Performed at [facility]    Report Status 08/22/2014 FINAL  Final  MRSA PCR Screening     Status: None   Collection Time: 08/16/14  6:11 PM  Result Value Ref Range Status   MRSA by PCR NEGATIVE NEGATIVE Final    Comment:        The GeneXpert MRSA Assay (FDA approved for NASAL specimens only), is one component of a comprehensive MRSA colonization surveillance program. It is not intended to diagnose MRSA infection nor to guide or monitor treatment for MRSA infections.   Culture, blood (routine x 2)     Status:  None (Preliminary result)   Collection Time: 08/19/14  3:17 PM  Result Value Ref Range Status   Specimen Description BLOOD RIGHT ARM  Final   Special Requests BOTTLES DRAWN AEROBIC ONLY 5CC  Final   Culture    Final           BLOOD CULTURE RECEIVED NO GROWTH TO DATE CULTURE WILL BE HELD FOR 5 DAYS BEFORE ISSUING A FINAL NEGATIVE REPORT Performed at [facility]    Report Status PENDING  Incomplete  Culture, blood (routine x 2)     Status: None (Preliminary result)   Collection Time: 08/19/14  3:27 PM  Result Value Ref Range Status   Specimen Description BLOOD RIGHT HAND  Final   Special Requests   Final    BOTTLES DRAWN AEROBIC AND ANAEROBIC 10CC BOTH BOTTLES   Culture   Final           BLOOD CULTURE RECEIVED NO GROWTH TO DATE CULTURE WILL BE HELD FOR 5 DAYS BEFORE ISSUING A FINAL NEGATIVE REPORT Performed at [facility]    Report Status PENDING  Incomplete     Studies: No results found.  Scheduled Meds: . acyclovir  800 mg Oral BID  . cholecalciferol  2,000 Units Oral q morning - 10a  . famotidine  20 mg Oral BID  . feeding supplement (RESOURCE BREEZE)  1 Container Oral TID BM  . ibrutinib  560 mg Oral Daily  . levofloxacin  500 mg Oral Daily  . levothyroxine  75 mcg Oral QAC breakfast  . loratadine  10 mg Oral q morning - 10a  . pantoprazole  40 mg Oral Daily  . predniSONE  60 mg Oral Q breakfast   Continuous Infusions:    Principal Problem:   Sepsis Active Problems:   Mantle cell lymphoma   Hypothyroidism   Anemia in neoplastic disease   Thrombocytopenia   S/P autologous bone marrow transplantation   Dyspnea   Acute respiratory failure with hypoxia   Acute renal failure   History of non-Hodgkin's lymphoma    Time spent: 35 min    [facility]  [facility] Pager [phone]  If 7PM-7AM, please contact night-coverage at [email or web address], password [facility] 08/22/2014, 4:09 PM  LOS: 6 days

## 2014-08-23 ENCOUNTER — Other Ambulatory Visit: Payer: Self-pay | Admitting: *Deleted

## 2014-08-23 ENCOUNTER — Telehealth: Payer: Self-pay | Admitting: *Deleted

## 2014-08-23 NOTE — Telephone Encounter (Signed)
Received call from daughter [name] wanting to know about Levaquin which was prescribed for pt since 08/19/14.  Pt was discharged from the hospital on 08/22/14.  [name] stated pt did not have any Levaquin available.  Spoke with Dr. Alvy Bimler.  Called [name] back and informed her re: 1.  Pt had received IV antibiotics throughout hospital stay.   2.  When pt was discharged, he just needed 2 more days of po Levaquin.  Rx was sent to pt's pharmacy by the hospitalist. 3.  Nurse called pt's pharmacy and confirmed that pt does have Levaquin 2 tabs available for pick up. 4.  Instructed [name] to pick up med and to give to pt today. [name] voiced understanding.   [name] phone    [phone].

## 2014-08-24 ENCOUNTER — Encounter: Payer: Self-pay | Admitting: Hematology and Oncology

## 2014-08-24 NOTE — Progress Notes (Signed)
Pt is approved with [employer] for Imbruv[employer] from 08/16/14 to 08/16/15 or when the benefit cap has been met.  Expenses can be submitted for reimbursement for dos 05/18/14 to 08/16/15.  The amount of the grant is $12,500.

## 2014-08-25 LAB — CULTURE, BLOOD (ROUTINE X 2)
CULTURE: NO GROWTH
CULTURE: NO GROWTH

## 2014-08-28 ENCOUNTER — Other Ambulatory Visit (HOSPITAL_BASED_OUTPATIENT_CLINIC_OR_DEPARTMENT_OTHER): Payer: Medicare PPO

## 2014-08-28 ENCOUNTER — Ambulatory Visit: Payer: Medicare PPO

## 2014-08-28 ENCOUNTER — Ambulatory Visit (HOSPITAL_BASED_OUTPATIENT_CLINIC_OR_DEPARTMENT_OTHER): Payer: Medicare PPO | Admitting: Hematology and Oncology

## 2014-08-28 ENCOUNTER — Telehealth: Payer: Self-pay | Admitting: Hematology and Oncology

## 2014-08-28 ENCOUNTER — Other Ambulatory Visit: Payer: Self-pay | Admitting: *Deleted

## 2014-08-28 VITALS — BP 159/72 | HR 60 | Temp 97.8°F | Resp 18 | Ht 75.0 in | Wt 200.9 lb

## 2014-08-28 DIAGNOSIS — D696 Thrombocytopenia, unspecified: Secondary | ICD-10-CM

## 2014-08-28 DIAGNOSIS — C8310 Mantle cell lymphoma, unspecified site: Secondary | ICD-10-CM

## 2014-08-28 DIAGNOSIS — Z95828 Presence of other vascular implants and grafts: Secondary | ICD-10-CM

## 2014-08-28 LAB — CBC WITH DIFFERENTIAL/PLATELET
BASO%: 0.2 % (ref 0.0–2.0)
BASOS ABS: 0 10*3/uL (ref 0.0–0.1)
EOS ABS: 0 10*3/uL (ref 0.0–0.5)
EOS%: 0.4 % (ref 0.0–7.0)
HCT: 41.1 % (ref 38.4–49.9)
HGB: 13.7 g/dL (ref 13.0–17.1)
LYMPH#: 4.2 10*3/uL — AB (ref 0.9–3.3)
LYMPH%: 37.2 % (ref 14.0–49.0)
MCH: 30.8 pg (ref 27.2–33.4)
MCHC: 33.3 g/dL (ref 32.0–36.0)
MCV: 92.4 fL (ref 79.3–98.0)
MONO#: 0.8 10*3/uL (ref 0.1–0.9)
MONO%: 7.3 % (ref 0.0–14.0)
NEUT#: 6.1 10*3/uL (ref 1.5–6.5)
NEUT%: 54.9 % (ref 39.0–75.0)
Platelets: 99 10*3/uL — ABNORMAL LOW (ref 140–400)
RBC: 4.45 10*6/uL (ref 4.20–5.82)
RDW: 15.4 % — AB (ref 11.0–14.6)
WBC: 11.2 10*3/uL — ABNORMAL HIGH (ref 4.0–10.3)

## 2014-08-28 LAB — COMPREHENSIVE METABOLIC PANEL (CC13)
ALT: 18 U/L (ref 0–55)
AST: 10 U/L (ref 5–34)
Albumin: 3.7 g/dL (ref 3.5–5.0)
Alkaline Phosphatase: 78 U/L (ref 40–150)
Anion Gap: 10 mEq/L (ref 3–11)
BILIRUBIN TOTAL: 0.68 mg/dL (ref 0.20–1.20)
BUN: 21.5 mg/dL (ref 7.0–26.0)
CO2: 25 meq/L (ref 22–29)
CREATININE: 1.1 mg/dL (ref 0.7–1.3)
Calcium: 8.8 mg/dL (ref 8.4–10.4)
Chloride: 107 mEq/L (ref 98–109)
EGFR: 70 mL/min/{1.73_m2} — ABNORMAL LOW (ref >90)
Glucose: 99 mg/dl (ref 70–140)
Potassium: 3.7 mEq/L (ref 3.5–5.1)
Sodium: 142 mEq/L (ref 136–145)
TOTAL PROTEIN: 5.8 g/dL — AB (ref 6.4–8.3)

## 2014-08-28 LAB — TECHNOLOGIST REVIEW

## 2014-08-28 LAB — HOLD TUBE, BLOOD BANK

## 2014-08-28 MED ORDER — IBRUTINIB 140 MG PO CAPS: 560.0000 mg | ORAL_CAPSULE | Freq: Every day | ORAL | Status: DC

## 2014-08-28 MED ORDER — SODIUM CHLORIDE 0.9 % IJ SOLN
10.0000 mL | INTRAMUSCULAR | Status: DC | PRN
  Filled 2014-08-28: qty 10

## 2014-08-28 NOTE — Assessment & Plan Note (Signed)
This history related to recent splenomegaly but is improving. Continue observation only.

## 2014-08-28 NOTE — Progress Notes (Signed)
Pt states that he doesn't need his pac accessed.  Pt decided to get labs drawn from his arm.

## 2014-08-28 NOTE — Telephone Encounter (Signed)
gv and printed appt sched and avs for pt Feb..Marland KitchenMarland Kitchen

## 2014-08-28 NOTE — Assessment & Plan Note (Signed)
The patient had excellent response to treatment with resolution of splenomegaly and improvement of his blood count. I will continue the same dose without adjustment. I would initiate prednisone taper.

## 2014-08-28 NOTE — Progress Notes (Signed)
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 In addition, there is moderate splenomegaly with associated hypermetabolic activity consistent with lymphomatous involvement   09/21/2013 - 01/26/2014 Chemotherapy The patient received 6 cycles of bendamustine and rituximab   01/24/2014 Imaging Repeat PET/CT scan showed near complete response to treatment.   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 He feels well. He has no side effects from treatment. He is eating well and gaining weight. Splenomegaly has resolved. Shoulder pain has resolved. Nausea and vomiting has resolved.  REVIEW  OF SYSTEMS:   Constitutional: Denies fevers, chills or abnormal weight loss Eyes: Denies blurriness of vision Ears, nose, mouth, throat, and face: Denies mucositis or sore throat Respiratory: Denies cough, dyspnea or wheezes Cardiovascular: Denies palpitation, chest discomfort or lower extremity swelling Gastrointestinal:  Denies nausea, heartburn or change in bowel habits Skin: Denies abnormal skin rashes Lymphatics: Denies new lymphadenopathy or easy bruising Neurological:Denies numbness, tingling or new weaknesses Behavioral/Psych: Mood is stable, no new changes  All other systems were reviewed with the patient and are negative.  I have reviewed the past medical history, past surgical history, social history and family history with the patient and they are unchanged from previous note.  ALLERGIES:  has No Known Allergies.  MEDICATIONS:  Current Outpatient Prescriptions  Medication Sig Dispense Refill  . acetaminophen (TYLENOL) 500 MG tablet Take 500 mg by mouth every 6 (six) hours as needed for moderate pain or fever.     Marland Kitchen acyclovir (ZOVIRAX) 800 MG tablet Take 800 mg by mouth 2 (two) times daily.     . cholecalciferol (VITAMIN D) 1000 UNITS tablet Take 2,000 Units by mouth every morning.    . ibrutinib (IMBRUVICA) 140 MG capsul Take 4 capsules (560 mg total) by mouth daily. 120 capsule 6  . levothyroxine (SYNTHROID, LEVOTHROID) 75 MCG tablet Take 1 tablet (75 mcg total) by mouth daily. 30 tablet 6  . loratadine (CLARITIN) 10 MG tablet Take 10 mg by mouth every morning.    . Multiple Vitamin tablet Take 1 tablet by mouth.    . predniSONE (DELTASONE) 20 MG tablet Take 3 tablets (60 mg total) by mouth daily with breakfast. 90 tablet 0  .  oxyCODONE (OXY IR/ROXICODONE) 5 MG immediate release tablet Take 1 tablet (5 mg total) by mouth every 4 (four) hours as needed for severe pain. (Patient not taking: Reported on 08/28/2014) 60 tablet 0  . pantoprazole (PROTONIX) 40 MG tablet Take 1  tablet (40 mg total) by mouth daily. (Patient not taking: Reported on 08/28/2014) 30 tablet 0   No current facility-administered medications for this visit.    PHYSICAL EXAMINATION: ECOG PERFORMANCE STATUS: 0 - Asymptomatic  Filed Vitals:   08/28/14 0930  BP: 159/72  Pulse: 60  Temp: 97.8 F (36.6 C)  Resp: 18   Filed Weights   08/28/14 0930  Weight: 200 lb 14.4 oz (91.128 kg)    GENERAL:alert, no distress and comfortable SKIN: skin color, texture, turgor are normal, no rashes or significant lesions EYES: normal, Conjunctiva are pink and non-injected, sclera clear OROPHARYNX:no exudate, no erythema and lips, buccal mucosa, and tongue normal  NECK: supple, thyroid normal size, non-tender, without nodularity LYMPH:  no palpable lymphadenopathy in the cervical, axillary or inguinal LUNGS: clear to auscultation and percussion with normal breathing effort HEART: regular rate & rhythm and no murmurs and no lower extremity edema ABDOMEN:abdomen soft, non-tender and normal bowel sounds. Splenomegaly has resolved Musculoskeletal:no cyanosis of digits and no clubbing  NEURO: alert & oriented x 3 with fluent speech, no focal motor/sensory deficits  LABORATORY DATA:  I have reviewed the data as listed    Component Value Date/Time   NA 142 08/28/2014 0907   NA 140 08/22/2014 0406   K 3.7 08/28/2014 0907   K 4.3 08/22/2014 0406   CL 109 08/22/2014 0406   CO2 25 08/28/2014 0907   CO2 24 08/22/2014 0406   GLUCOSE 99 08/28/2014 0907   GLUCOSE 117* 08/22/2014 0406   BUN 21.5 08/28/2014 0907   BUN 32* 08/22/2014 0406   CREATININE 1.1 08/28/2014 0907   CREATININE 1.24 08/22/2014 0406   CALCIUM 8.8 08/28/2014 0907   CALCIUM 8.7 08/22/2014 0406   PROT 5.8* 08/28/2014 0907   PROT 5.6* 08/22/2014 0406   ALBUMIN 3.7 08/28/2014 0907   ALBUMIN 3.3* 08/22/2014 0406   AST 10 08/28/2014 0907   AST 24 08/22/2014 0406   ALT 18 08/28/2014 0907   ALT 40 08/22/2014 0406   ALKPHOS 78  08/28/2014 0907   ALKPHOS 70 08/22/2014 0406   BILITOT 0.68 08/28/2014 0907   BILITOT 0.7 08/22/2014 0406   GFRNONAA 56* 08/22/2014 0406   GFRAA 65* 08/22/2014 0406    No results found for: SPEP, UPEP  Lab Results  Component Value Date   WBC 11.2* 08/28/2014   NEUTROABS 6.1 08/28/2014   HGB 13.7 08/28/2014   HCT 41.1 08/28/2014   MCV 92.4 08/28/2014   PLT 99* 08/28/2014      Chemistry      Component Value Date/Time   NA 142 08/28/2014 0907   NA 140 08/22/2014 0406   K 3.7 08/28/2014 0907   K 4.3 08/22/2014 0406   CL 109 08/22/2014 0406   CO2 25 08/28/2014 0907   CO2 24 08/22/2014 0406   BUN 21.5 08/28/2014 0907   BUN 32* 08/22/2014 0406   CREATININE 1.1 08/28/2014 0907   CREATININE 1.24 08/22/2014 0406      Component Value Date/Time   CALCIUM 8.8 08/28/2014 0907   CALCIUM 8.7 08/22/2014 0406   ALKPHOS 78 08/28/2014 0907   ALKPHOS 70 08/22/2014 0406   AST 10 08/28/2014 0907   AST 24 08/22/2014 0406   ALT 18 08/28/2014 5400  ALT 40 08/22/2014 0406   BILITOT 0.68 08/28/2014 0907   BILITOT 0.7 08/22/2014 0406      ASSESSMENT & PLAN:  Mantle cell lymphoma The patient had excellent response to treatment with resolution of splenomegaly and improvement of his blood count. I will continue the same dose without adjustment. I would initiate prednisone taper.   Thrombocytopenia This history related to recent splenomegaly but is improving. Continue observation only.  His port is not working. We will get him to the flush room and give him alteplase to get it working again.  Orders Placed This Encounter  Procedures  . Uric Acid    Standing Status: Future     Number of Occurrences:      Standing Expiration Date: 10/02/2015  . Lactate dehydrogenase    Standing Status: Future     Number of Occurrences:      Standing Expiration Date: 10/02/2015   All questions were answered. The patient knows to call the clinic with any problems, questions or concerns. No barriers to  learning was detected. I spent 25 minutes counseling the patient face to face. The total time spent in the appointment was 30 minutes and more than 50% was on counseling and review of test results     [facility], Joshuwa Vecchio, MD 08/28/2014 11:43 AM   `

## 2014-08-28 NOTE — Progress Notes (Signed)
Pt in to get a PAC flush.  PAC flushed with saline and heparin.  No blood return noted.  Pt states his PAC has not given blood in some time.  Pt states that he wants to try the cathflo.  Appt made to have pt come back on 09-25-2014 and have labs, flush and TPA applied to his PAC.  Pt agrees and verbalizes understanding.

## 2014-08-31 ENCOUNTER — Other Ambulatory Visit: Payer: Self-pay | Admitting: Hematology and Oncology

## 2014-08-31 ENCOUNTER — Encounter: Payer: Self-pay | Admitting: Hematology and Oncology

## 2014-08-31 ENCOUNTER — Telehealth: Payer: Self-pay | Admitting: *Deleted

## 2014-08-31 DIAGNOSIS — R5383 Other fatigue: Secondary | ICD-10-CM

## 2014-08-31 HISTORY — DX: Other fatigue: R53.83

## 2014-08-31 NOTE — Telephone Encounter (Signed)
Daughter [facility] cannot fill pt's Imbruvica.  They say they don't carry it.  They recommended pt request med from [facility] or [facility].  Informed her Rx had been sent to [facility] and I will call to check on status of it..  She also informs pt c/o his hair is falling out and he has a "strange" texture to his skin.  She wants to make sure this is not something serious.   Informed Dr. Alvy Bimler of pt's hair falling out and strange texture to his skin.   She says could be side effect from treatment. It is not serious. I called [facility]. They are able to fill imbruvica if pt's insurance will approve it.  They are in process of obtaining the prior auth.  Med was last filled on 08/14/14 and they cannot fill it until after 2/5.   Called pt's daughter back to inform her of above,  There was no answer and no VM available to leave message.

## 2014-09-04 ENCOUNTER — Telehealth: Payer: Self-pay | Admitting: *Deleted

## 2014-09-04 NOTE — Telephone Encounter (Signed)
Gave pt's daughter the phone number for [facility] (ph [phone]) and asked her to call them to check the status of Rx for Imbruvica.  Informed her we did send Rx and I had confirmed they received it.  Asked her to let us know if there is anything else we can do to help pt get Rx.  She verbalized understanding.

## 2014-09-04 NOTE — Telephone Encounter (Signed)
Call received in [place] from pt's daughter Hal Hope- wanting to follow up from [facility] regarding refill.  This RN informed Candace of note entered on 1/29 per Ze[place] stated " I may have out of range because I never noticed a call from your office.  Candace had multiple questions regarding refill of Imbruvica and concern about when they would receive it.  Currently needs to restart the 12th of February.  "How do I know this other insurance - and do they have the information that was obtained from the company where he  has a zero co-pay ?"  " will they call dad or who to deliver it "  " sometimes Dad is in [facility] but if he feels bad he comes back here "  " how do we refill it - will dad have to call "  This RN informed Candace above may best be answered by [facility] or by managed care and call will be returned to her.  Return call [phone].  THIS NOTE WILL BE SENT TO RN AT MD DESK FOR RETURN CALL PER QUESTIONS.

## 2014-09-12 ENCOUNTER — Encounter: Payer: Self-pay | Admitting: *Deleted

## 2014-09-12 NOTE — Progress Notes (Signed)
Fax from [facility] [phone], [phone]) states they have shipped Imbruvica to pt for delivery yesterday on 2/9.  His co-pay is $0.

## 2014-09-18 ENCOUNTER — Other Ambulatory Visit: Payer: Self-pay | Admitting: *Deleted

## 2014-09-25 ENCOUNTER — Ambulatory Visit: Payer: Medicare PPO

## 2014-09-25 ENCOUNTER — Other Ambulatory Visit (HOSPITAL_BASED_OUTPATIENT_CLINIC_OR_DEPARTMENT_OTHER): Payer: Medicare PPO

## 2014-09-25 ENCOUNTER — Ambulatory Visit (HOSPITAL_BASED_OUTPATIENT_CLINIC_OR_DEPARTMENT_OTHER): Payer: Medicare PPO

## 2014-09-25 ENCOUNTER — Ambulatory Visit (HOSPITAL_BASED_OUTPATIENT_CLINIC_OR_DEPARTMENT_OTHER): Payer: Medicare PPO | Admitting: Hematology and Oncology

## 2014-09-25 ENCOUNTER — Encounter: Payer: Self-pay | Admitting: Hematology and Oncology

## 2014-09-25 ENCOUNTER — Telehealth: Payer: Self-pay | Admitting: Hematology and Oncology

## 2014-09-25 VITALS — Ht 75.0 in | Wt 206.0 lb

## 2014-09-25 VITALS — BP 176/81 | HR 65 | Temp 97.5°F | Wt 206.0 lb

## 2014-09-25 DIAGNOSIS — C8310 Mantle cell lymphoma, unspecified site: Secondary | ICD-10-CM

## 2014-09-25 DIAGNOSIS — R7989 Other specified abnormal findings of blood chemistry: Secondary | ICD-10-CM

## 2014-09-25 DIAGNOSIS — M25512 Pain in left shoulder: Secondary | ICD-10-CM

## 2014-09-25 DIAGNOSIS — D696 Thrombocytopenia, unspecified: Secondary | ICD-10-CM

## 2014-09-25 DIAGNOSIS — E039 Hypothyroidism, unspecified: Secondary | ICD-10-CM

## 2014-09-25 DIAGNOSIS — R5383 Other fatigue: Secondary | ICD-10-CM

## 2014-09-25 DIAGNOSIS — Z95828 Presence of other vascular implants and grafts: Secondary | ICD-10-CM

## 2014-09-25 DIAGNOSIS — M109 Gout, unspecified: Secondary | ICD-10-CM

## 2014-09-25 DIAGNOSIS — D630 Anemia in neoplastic disease: Secondary | ICD-10-CM

## 2014-09-25 DIAGNOSIS — M1000 Idiopathic gout, unspecified site: Secondary | ICD-10-CM

## 2014-09-25 DIAGNOSIS — E790 Hyperuricemia without signs of inflammatory arthritis and tophaceous disease: Secondary | ICD-10-CM

## 2014-09-25 LAB — COMPREHENSIVE METABOLIC PANEL (CC13)
ALT: 19 U/L (ref 0–55)
ANION GAP: 11 meq/L (ref 3–11)
AST: 19 U/L (ref 5–34)
Albumin: 3.7 g/dL (ref 3.5–5.0)
Alkaline Phosphatase: 80 U/L (ref 40–150)
BILIRUBIN TOTAL: 0.8 mg/dL (ref 0.20–1.20)
BUN: 14.3 mg/dL (ref 7.0–26.0)
CO2: 26 mEq/L (ref 22–29)
Calcium: 9.4 mg/dL (ref 8.4–10.4)
Chloride: 110 mEq/L — ABNORMAL HIGH (ref 98–109)
Creatinine: 1.1 mg/dL (ref 0.7–1.3)
EGFR: 65 mL/min/{1.73_m2} — ABNORMAL LOW (ref >90)
GLUCOSE: 91 mg/dL (ref 70–140)
Potassium: 4.3 mEq/L (ref 3.5–5.1)
Sodium: 147 mEq/L — ABNORMAL HIGH (ref 136–145)
TOTAL PROTEIN: 6.5 g/dL (ref 6.4–8.3)

## 2014-09-25 LAB — CBC WITH DIFFERENTIAL/PLATELET
BASO%: 0.4 % (ref 0.0–2.0)
Basophils Absolute: 0 10*3/uL (ref 0.0–0.1)
EOS%: 3.2 % (ref 0.0–7.0)
Eosinophils Absolute: 0.3 10*3/uL (ref 0.0–0.5)
HCT: 37.4 % — ABNORMAL LOW (ref 38.4–49.9)
HGB: 12.6 g/dL — ABNORMAL LOW (ref 13.0–17.1)
LYMPH%: 14.7 % (ref 14.0–49.0)
MCH: 31 pg (ref 27.2–33.4)
MCHC: 33.7 g/dL (ref 32.0–36.0)
MCV: 91.9 fL (ref 79.3–98.0)
MONO#: 1.1 10*3/uL — ABNORMAL HIGH (ref 0.1–0.9)
MONO%: 11.9 % (ref 0.0–14.0)
NEUT#: 6.4 10*3/uL (ref 1.5–6.5)
NEUT%: 69.8 % (ref 39.0–75.0)
Platelets: 97 10*3/uL — ABNORMAL LOW (ref 140–400)
RBC: 4.07 10*6/uL — ABNORMAL LOW (ref 4.20–5.82)
RDW: 15.5 % — ABNORMAL HIGH (ref 11.0–14.6)
WBC: 9.1 10*3/uL (ref 4.0–10.3)
lymph#: 1.3 10*3/uL (ref 0.9–3.3)

## 2014-09-25 LAB — TECHNOLOGIST REVIEW

## 2014-09-25 LAB — URIC ACID (CC13): URIC ACID, SERUM: 9.4 mg/dL — AB (ref 2.6–7.4)

## 2014-09-25 LAB — LACTATE DEHYDROGENASE (CC13): LDH: 236 U/L (ref 125–245)

## 2014-09-25 LAB — TSH CHCC: TSH: 3.627 m(IU)/L (ref 0.320–4.118)

## 2014-09-25 LAB — T4, FREE: Free T4: 1.39 ng/dL (ref 0.80–1.80)

## 2014-09-25 MED ORDER — ALLOPURINOL 300 MG PO TABS: 300.0000 mg | ORAL_TABLET | Freq: Every day | ORAL | Status: DC

## 2014-09-25 MED ORDER — SODIUM CHLORIDE 0.9 % IJ SOLN
10.0000 mL | INTRAMUSCULAR | Status: DC | PRN
  Administered 2014-09-25: 10 mL via INTRAVENOUS
  Filled 2014-09-25: qty 10

## 2014-09-25 NOTE — Patient Instructions (Signed)

## 2014-09-25 NOTE — Telephone Encounter (Signed)
no vm.....mailed pt appt sched/avs and letter °

## 2014-09-25 NOTE — Assessment & Plan Note (Signed)
I did not feel splenomegaly. I am wondering whether this could be due to gout attack. I recommend low-dose prednisone for 1 week to see if this could help with his pain. I recommend PET/CT scan next month for assessment.

## 2014-09-25 NOTE — Progress Notes (Signed)
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 In addition, there is moderate splenomegaly with associated hypermetabolic activity consistent with lymphomatous involvement   09/21/2013 - 01/26/2014 Chemotherapy The patient received 6 cycles of bendamustine and rituximab   01/24/2014 Imaging Repeat PET/CT scan showed near complete response to treatment.   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 The patient is seen for further toxicity review related to recent start of chemotherapy. He tolerated treatment well. He has gained weight. His only complaint is left shoulder pain that started  4 days ago. It is unassociated with any joint swelling. He does not take any pain medicine for this.  REVIEW OF SYSTEMS:   Constitutional: Denies fevers, chills or abnormal weight loss Eyes: Denies blurriness of vision Ears, nose, mouth, throat, and face: Denies mucositis or sore throat Respiratory: Denies cough, dyspnea or wheezes Cardiovascular: Denies palpitation, chest discomfort or lower extremity swelling Gastrointestinal:  Denies nausea, heartburn or change in bowel habits Skin: Denies abnormal skin rashes Lymphatics: Denies new lymphadenopathy or easy bruising Neurological:Denies numbness, tingling or new weaknesses Behavioral/Psych: Mood is stable, no new changes  All other systems were reviewed with the patient and are negative.  I have reviewed the past medical history, past surgical history, social history and family history with the patient and they are unchanged from previous note.  ALLERGIES:  has No Known Allergies.  MEDICATIONS:  Current Outpatient Prescriptions  Medication Sig Dispense Refill  . acetaminophen (TYLENOL) 500 MG tablet Take 500 mg by mouth every 6 (six) hours as needed for moderate pain or fever.     Marland Kitchen acyclovir (ZOVIRAX) 800 MG tablet Take 800 mg by mouth 2 (two) times daily.     Marland Kitchen allopurinol (ZYLOPRIM) 300 MG tablet Take 1 tablet (300 mg total) by mouth daily. 30 tablet 6  . cholecalciferol (VITAMIN D) 1000 UNITS tablet Take 2,000 Units by mouth every morning.    . ibrutinib (IMBRUVICA) 140 MG capsul Take 4 capsules (560 mg total) by mouth daily. 120 capsule 6  . levothyroxine (SYNTHROID, LEVOTHROID) 75 MCG tablet Take 1 tablet (75 mcg total) by mouth daily. 30 tablet 6  . loratadine (CLARITIN) 10 MG tablet Take 10 mg  by mouth every morning.    . Multiple Vitamin tablet Take 1 tablet by mouth.    . oxyCODONE (OXY IR/ROXICODONE) 5 MG immediate release tablet Take 1 tablet (5 mg total) by mouth every 4 (four) hours as needed for severe pain. 60 tablet 0   . pantoprazole (PROTONIX) 40 MG tablet Take 1 tablet (40 mg total) by mouth daily. 30 tablet 0  . predniSONE (DELTASONE) 20 MG tablet Take 3 tablets (60 mg total) by mouth daily with breakfast. 90 tablet 0   No current facility-administered medications for this visit.   Facility-Administered Medications Ordered in Other Visits  Medication Dose Route Frequency Provider Last Rate Last Dose  . sodium chloride 0.9 % injection 10 mL  10 mL Intravenous PRN Heath Lark, MD   10 mL at 09/25/14 1456    PHYSICAL EXAMINATION: ECOG PERFORMANCE STATUS: 0 - Asymptomatic  There were no vitals filed for this visit. Filed Weights   09/25/14 1503  Weight: 206 lb (93.441 kg)    GENERAL:alert, no distress and comfortable SKIN: skin color, texture, turgor are normal, no rashes or significant lesions EYES: normal, Conjunctiva are pink and non-injected, sclera clear OROPHARYNX:no exudate, no erythema and lips, buccal mucosa, and tongue normal  NECK: supple, thyroid normal size, non-tender, without nodularity LYMPH:  no palpable lymphadenopathy in the cervical, axillary or inguinal LUNGS: clear to auscultation and percussion with normal breathing effort HEART: regular rate & rhythm and no murmurs and no lower extremity edema ABDOMEN:abdomen soft, non-tender and normal bowel sounds. No splenomegaly Musculoskeletal:no cyanosis of digits and no clubbing  NEURO: alert & oriented x 3 with fluent speech, no focal motor/sensory deficits  LABORATORY DATA:  I have reviewed the data as listed    Component Value Date/Time   NA 147* 09/25/2014 1432   NA 140 08/22/2014 0406   K 4.3 09/25/2014 1432   K 4.3 08/22/2014 0406   CL 109 08/22/2014 0406   CO2 26 09/25/2014 1432   CO2 24 08/22/2014 0406   GLUCOSE 91 09/25/2014 1432   GLUCOSE 117* 08/22/2014 0406   BUN 14.3 09/25/2014 1432   BUN 32* 08/22/2014 0406   CREATININE 1.1 09/25/2014 1432   CREATININE 1.24 08/22/2014 0406   CALCIUM 9.4 09/25/2014 1432    CALCIUM 8.7 08/22/2014 0406   PROT 6.5 09/25/2014 1432   PROT 5.6* 08/22/2014 0406   ALBUMIN 3.7 09/25/2014 1432   ALBUMIN 3.3* 08/22/2014 0406   AST 19 09/25/2014 1432   AST 24 08/22/2014 0406   ALT 19 09/25/2014 1432   ALT 40 08/22/2014 0406   ALKPHOS 80 09/25/2014 1432   ALKPHOS 70 08/22/2014 0406   BILITOT 0.80 09/25/2014 1432   BILITOT 0.7 08/22/2014 0406   GFRNONAA 56* 08/22/2014 0406   GFRAA 65* 08/22/2014 0406    No results found for: SPEP, UPEP  Lab Results  Component Value Date   WBC 9.1 09/25/2014   NEUTROABS 6.4 09/25/2014   HGB 12.6* 09/25/2014   HCT 37.4* 09/25/2014   MCV 91.9 09/25/2014   PLT 97* 09/25/2014      Chemistry      Component Value Date/Time   NA 147* 09/25/2014 1432   NA 140 08/22/2014 0406   K 4.3 09/25/2014 1432   K 4.3 08/22/2014 0406   CL 109 08/22/2014 0406   CO2 26 09/25/2014 1432   CO2 24 08/22/2014 0406   BUN 14.3 09/25/2014 1432   BUN 32* 08/22/2014 0406   CREATININE 1.1 09/25/2014 1432   CREATININE  1.24 08/22/2014 0406      Component Value Date/Time   CALCIUM 9.4 09/25/2014 1432   CALCIUM 8.7 08/22/2014 0406   ALKPHOS 80 09/25/2014 1432   ALKPHOS 70 08/22/2014 0406   AST 19 09/25/2014 1432   AST 24 08/22/2014 0406   ALT 19 09/25/2014 1432   ALT 40 08/22/2014 0406   BILITOT 0.80 09/25/2014 1432   BILITOT 0.7 08/22/2014 0406      ASSESSMENT & PLAN:  Mantle cell lymphoma Clinically, he appears to be in remission. However, he is concerned about new onset of left shoulder pain, whether this could represent a referred pain from relapse of cancer. I recommend he continue Ibrutinib for now. I will order repeat blood work and PET CT scan to assess response to treatment next month.   Anemia in neoplastic disease This is likely due to recent treatment. The patient denies recent history of bleeding such as epistaxis, hematuria or hematochezia. He is asymptomatic from the anemia. I will observe for now.  He does not require  transfusion now. I will continue the chemotherapy at current dose without dosage adjustment.  If the anemia gets progressive worse in the future, I might have to delay his treatment or adjust the chemotherapy dose.    Left shoulder pain I did not feel splenomegaly. I am wondering whether this could be due to gout attack. I recommend low-dose prednisone for 1 week to see if this could help with his pain. I recommend PET/CT scan next month for assessment.   Thrombocytopenia This history related to recent splenomegaly but is improving. Continue observation only.   Gout He has hyperuricemia and joint pain. I recommend 1 week treatment with prednisone along with allopurinol long-term.    Orders Placed This Encounter  Procedures  . NM PET Image Restag (PS) Skull Base To Thigh    Standing Status: Future     Number of Occurrences:      Standing Expiration Date: 11/25/2015    Order Specific Question:  Reason for Exam (SYMPTOM  OR DIAGNOSIS REQUIRED)    Answer:  mantle cell lymphoma, left shoulder pain, assess response to Rx    Order Specific Question:  Preferred imaging location?    Answer:  [facility]  . Lactate dehydrogenase    Standing Status: Standing     Number of Occurrences: 9     Standing Expiration Date: 09/26/2015   All questions were answered. The patient knows to call the clinic with any problems, questions or concerns. No barriers to learning was detected. I spent 30 minutes counseling the patient face to face. The total time spent in the appointment was 40 minutes and more than 50% was on counseling and review of test results     [facility], [place], MD 09/25/2014 4:15 PM

## 2014-09-25 NOTE — Telephone Encounter (Signed)
Pt confirmed labs/ov per 02/23 POF, gave pt AVS... KJ  °

## 2014-09-25 NOTE — Assessment & Plan Note (Signed)
Clinically, he appears to be in remission. However, he is concerned about new onset of left shoulder pain, whether this could represent a referred pain from relapse of cancer. I recommend he continue Ibrutinib for now. I will order repeat blood work and PET CT scan to assess response to treatment next month.

## 2014-09-25 NOTE — Assessment & Plan Note (Signed)
He has hyperuricemia and joint pain. I recommend 1 week treatment with prednisone along with allopurinol long-term.

## 2014-09-25 NOTE — Assessment & Plan Note (Signed)
This history related to recent splenomegaly but is improving. Continue observation only.

## 2014-09-25 NOTE — Assessment & Plan Note (Signed)
This is likely due to recent treatment. The patient denies recent history of bleeding such as epistaxis, hematuria or hematochezia. He is asymptomatic from the anemia. I will observe for now.  He does not require transfusion now. I will continue the chemotherapy at current dose without dosage adjustment.  If the anemia gets progressive worse in the future, I might have to delay his treatment or adjust the chemotherapy dose.  

## 2014-09-27 ENCOUNTER — Other Ambulatory Visit: Payer: Self-pay | Admitting: Hematology and Oncology

## 2014-09-28 ENCOUNTER — Telehealth: Payer: Self-pay | Admitting: Hematology and Oncology

## 2014-09-28 ENCOUNTER — Telehealth: Payer: Self-pay | Admitting: *Deleted

## 2014-09-28 ENCOUNTER — Other Ambulatory Visit: Payer: Self-pay | Admitting: *Deleted

## 2014-09-28 DIAGNOSIS — C8310 Mantle cell lymphoma, unspecified site: Secondary | ICD-10-CM

## 2014-09-28 MED ORDER — ACYCLOVIR 400 MG PO TABS
400.0000 mg | ORAL_TABLET | Freq: Every day | ORAL | Status: DC
Start: 2014-09-28 — End: 2015-02-10

## 2014-09-28 NOTE — Telephone Encounter (Signed)
Received voice message from daughter, Hayes Ludwig, that patient needs Acyclovir 800 mg refilled. Please send to [facility]. Per Dr. Alvy Bimler, let's do Zovirax 400 mg daily. She verbalized understanding.

## 2014-09-28 NOTE — Telephone Encounter (Signed)
returned pt call and r/s appt to after PET....Marland Kitchenpt ok and aware

## 2014-10-08 ENCOUNTER — Encounter: Payer: Self-pay | Admitting: *Deleted

## 2014-10-08 NOTE — Progress Notes (Signed)
Fax from [facility] [facility] Kate Sable was shipped to pt for delivery on 3/8.

## 2014-10-22 ENCOUNTER — Other Ambulatory Visit: Payer: Medicare PPO

## 2014-10-23 ENCOUNTER — Ambulatory Visit: Payer: Medicare PPO | Admitting: Hematology and Oncology

## 2014-10-24 ENCOUNTER — Other Ambulatory Visit: Payer: Medicare PPO

## 2014-10-24 ENCOUNTER — Encounter (HOSPITAL_COMMUNITY)
Admission: RE | Admit: 2014-10-24 | Discharge: 2014-10-24 | Disposition: A | Discharge status: Home / Self Care | Payer: Medicare PPO | Source: Ambulatory Visit | Attending: Hematology and Oncology | Admitting: Hematology and Oncology

## 2014-10-24 DIAGNOSIS — M1000 Idiopathic gout, unspecified site: Secondary | ICD-10-CM

## 2014-10-24 DIAGNOSIS — M25512 Pain in left shoulder: Secondary | ICD-10-CM | POA: Insufficient documentation

## 2014-10-24 DIAGNOSIS — C8310 Mantle cell lymphoma, unspecified site: Secondary | ICD-10-CM | POA: Diagnosis not present

## 2014-10-24 LAB — GLUCOSE, CAPILLARY: GLUCOSE-CAPILLARY: 80 mg/dL (ref 70–99)

## 2014-10-24 MED ORDER — FLUDEOXYGLUCOSE F - 18 (FDG) INJECTION
10.0000 | Freq: Once | INTRAVENOUS | Status: AC | PRN
  Administered 2014-10-24: 10 via INTRAVENOUS

## 2014-10-25 ENCOUNTER — Ambulatory Visit: Payer: Medicare PPO

## 2014-10-25 ENCOUNTER — Other Ambulatory Visit (HOSPITAL_BASED_OUTPATIENT_CLINIC_OR_DEPARTMENT_OTHER): Payer: Medicare PPO

## 2014-10-25 ENCOUNTER — Telehealth: Payer: Self-pay | Admitting: Hematology and Oncology

## 2014-10-25 ENCOUNTER — Ambulatory Visit (HOSPITAL_BASED_OUTPATIENT_CLINIC_OR_DEPARTMENT_OTHER): Payer: Medicare PPO | Admitting: Hematology and Oncology

## 2014-10-25 ENCOUNTER — Other Ambulatory Visit: Payer: Self-pay | Admitting: Hematology and Oncology

## 2014-10-25 ENCOUNTER — Encounter: Payer: Self-pay | Admitting: Hematology and Oncology

## 2014-10-25 ENCOUNTER — Ambulatory Visit (HOSPITAL_BASED_OUTPATIENT_CLINIC_OR_DEPARTMENT_OTHER): Payer: Medicare PPO

## 2014-10-25 ENCOUNTER — Telehealth: Payer: Self-pay | Admitting: *Deleted

## 2014-10-25 VITALS — BP 153/67 | HR 75 | Temp 97.8°F | Resp 18 | Ht 75.0 in | Wt 210.2 lb

## 2014-10-25 DIAGNOSIS — I10 Essential (primary) hypertension: Secondary | ICD-10-CM | POA: Diagnosis not present

## 2014-10-25 DIAGNOSIS — C8310 Mantle cell lymphoma, unspecified site: Secondary | ICD-10-CM

## 2014-10-25 DIAGNOSIS — M25512 Pain in left shoulder: Secondary | ICD-10-CM | POA: Diagnosis not present

## 2014-10-25 DIAGNOSIS — Z9481 Bone marrow transplant status: Secondary | ICD-10-CM

## 2014-10-25 DIAGNOSIS — D630 Anemia in neoplastic disease: Secondary | ICD-10-CM | POA: Diagnosis not present

## 2014-10-25 DIAGNOSIS — M1000 Idiopathic gout, unspecified site: Secondary | ICD-10-CM

## 2014-10-25 DIAGNOSIS — Z95828 Presence of other vascular implants and grafts: Secondary | ICD-10-CM

## 2014-10-25 DIAGNOSIS — Z23 Encounter for immunization: Secondary | ICD-10-CM

## 2014-10-25 HISTORY — DX: Essential (primary) hypertension: I10

## 2014-10-25 LAB — COMPREHENSIVE METABOLIC PANEL (CC13)
ALK PHOS: 75 U/L (ref 40–150)
ALT: 16 U/L (ref 0–55)
AST: 27 U/L (ref 5–34)
Albumin: 3.9 g/dL (ref 3.5–5.0)
Anion Gap: 10 mEq/L (ref 3–11)
BILIRUBIN TOTAL: 0.72 mg/dL (ref 0.20–1.20)
BUN: 16.3 mg/dL (ref 7.0–26.0)
CO2: 21 mEq/L — ABNORMAL LOW (ref 22–29)
CREATININE: 1.1 mg/dL (ref 0.7–1.3)
Calcium: 9.4 mg/dL (ref 8.4–10.4)
Chloride: 109 mEq/L (ref 98–109)
EGFR: 64 mL/min/{1.73_m2} — AB (ref >90)
GLUCOSE: 92 mg/dL (ref 70–140)
Potassium: 4.2 mEq/L (ref 3.5–5.1)
Sodium: 140 mEq/L (ref 136–145)
Total Protein: 6.8 g/dL (ref 6.4–8.3)

## 2014-10-25 LAB — CBC WITH DIFFERENTIAL/PLATELET
BASO%: 1.3 % (ref 0.0–2.0)
BASOS ABS: 0.1 10*3/uL (ref 0.0–0.1)
EOS ABS: 0.6 10*3/uL — AB (ref 0.0–0.5)
EOS%: 10 % — ABNORMAL HIGH (ref 0.0–7.0)
HCT: 36.5 % — ABNORMAL LOW (ref 38.4–49.9)
HEMOGLOBIN: 12.7 g/dL — AB (ref 13.0–17.1)
LYMPH#: 1.1 10*3/uL (ref 0.9–3.3)
LYMPH%: 17.5 % (ref 14.0–49.0)
MCH: 31.9 pg (ref 27.2–33.4)
MCHC: 34.8 g/dL (ref 32.0–36.0)
MCV: 91.7 fL (ref 79.3–98.0)
MONO#: 1 10*3/uL — ABNORMAL HIGH (ref 0.1–0.9)
MONO%: 15.7 % — ABNORMAL HIGH (ref 0.0–14.0)
NEUT%: 55.5 % (ref 39.0–75.0)
NEUTROS ABS: 3.4 10*3/uL (ref 1.5–6.5)
Platelets: 166 10*3/uL (ref 140–400)
RBC: 3.98 10*6/uL — AB (ref 4.20–5.82)
RDW: 16.4 % — ABNORMAL HIGH (ref 11.0–14.6)
WBC: 6.2 10*3/uL (ref 4.0–10.3)

## 2014-10-25 LAB — LACTATE DEHYDROGENASE (CC13): LDH: 344 U/L — AB (ref 125–245)

## 2014-10-25 MED ORDER — POLIOVIRUS VACCINE INACTIVATED IJ INJ
0.5000 mL | INJECTION | Freq: Once | INTRAMUSCULAR | Status: AC
  Administered 2014-10-25: 0.5 mL via SUBCUTANEOUS
  Filled 2014-10-25: qty 5

## 2014-10-25 MED ORDER — HAEMOPHILUS B POLYSAC CONJ VAC IM SOLR
0.5000 mL | Freq: Once | INTRAMUSCULAR | Status: AC
  Administered 2014-10-25: 0.5 mL via INTRAMUSCULAR
  Filled 2014-10-25: qty 0.5

## 2014-10-25 MED ORDER — HEPATITIS B VAC RECOMBINANT 20 MCG/ML IJ SUSP
1.0000 mL | Freq: Once | INTRAMUSCULAR | Status: AC
  Administered 2014-10-25: 20 ug via INTRAMUSCULAR
  Filled 2014-10-25: qty 1

## 2014-10-25 MED ORDER — DTAP-HEPATITIS B RECOMB-IPV IM SUSP
0.5000 mL | Freq: Once | INTRAMUSCULAR | Status: AC
  Administered 2014-10-25: 0.5 mL via INTRAMUSCULAR
  Filled 2014-10-25: qty 0.5

## 2014-10-25 MED ORDER — PNEUMOCOCCAL 13-VAL CONJ VACC IM SUSP
0.5000 mL | Freq: Once | INTRAMUSCULAR | Status: AC
  Administered 2014-10-25: 0.5 mL via INTRAMUSCULAR
  Filled 2014-10-25: qty 0.5

## 2014-10-25 MED ORDER — HEPARIN SOD (PORK) LOCK FLUSH 100 UNIT/ML IV SOLN
500.0000 [IU] | Freq: Once | INTRAVENOUS | Status: AC
  Administered 2014-10-25: 500 [IU] via INTRAVENOUS
  Filled 2014-10-25: qty 5

## 2014-10-25 MED ORDER — HYDROCHLOROTHIAZIDE 12.5 MG PO TABS: 12.5000 mg | ORAL_TABLET | Freq: Every day | ORAL | Status: DC

## 2014-10-25 MED ORDER — SODIUM CHLORIDE 0.9 % IJ SOLN
10.0000 mL | INTRAMUSCULAR | Status: DC | PRN
  Administered 2014-10-25: 10 mL via INTRAVENOUS
  Filled 2014-10-25: qty 10

## 2014-10-25 MED ORDER — MENINGOCOCCAL VAC A,C,Y,W-135 ~~LOC~~ INJ
0.5000 mL | INJECTION | Freq: Once | SUBCUTANEOUS | Status: AC
  Administered 2014-10-25: 0.5 mL via SUBCUTANEOUS
  Filled 2014-10-25: qty 0.5

## 2014-10-25 NOTE — Assessment & Plan Note (Addendum)
He has excellent response to treatment. I will continue the same without dose adjustment. I will see him back in 3 months to repeat history, physical examination and blood work and repeat imaging in 6 months.  I will flush his port today. The patient is interested to see if he can get his port flush in 6 weeks near his home in [facility] His daughter will call me with information and will can try to arrange with his local providers to see if we can get his port flushed then.

## 2014-10-25 NOTE — Assessment & Plan Note (Signed)
He has started to become hypertensive due to weight gain. I recommend low-dose hydrochlorothiazide and follow-up with PCP for blood pressure management.

## 2014-10-25 NOTE — Assessment & Plan Note (Signed)
This is likely due to recent treatment. The patient denies recent history of bleeding such as epistaxis, hematuria or hematochezia. He is asymptomatic from the anemia. I will observe for now.  He does not require transfusion now. I will continue the chemotherapy at current dose without dosage adjustment.  If the anemia gets progressive worse in the future, I might have to delay his treatment or adjust the chemotherapy dose.  

## 2014-10-25 NOTE — Telephone Encounter (Signed)
S/w nurse, Verdene Lennert, at pt's PCP, Dr. Harvel Ricks in [facility]  Explained pt needs PAC flush every 4 to 6 weeks but only wants to return to [facility] for his MD visits every 3 months.  He will need PAC inbetween his visits here.  Verdene Lennert says they can arrange for pt to have his PAC flushed at the local hospital,  They just need a Rx for the port flush faxed to them.   I faxed the Rx and also faxed office note and CXR confirming PAC placement to them as requested.

## 2014-10-25 NOTE — Telephone Encounter (Signed)
gave and printed appt sched and avs for pt for June °

## 2014-10-25 NOTE — Assessment & Plan Note (Signed)
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 He Is scheduled today to get his 6 months post transplant vaccination. Today, he will get hepatitis B, Haemophilus influenza B, inactivated polio, meningococcal conjugate , Prevnar 13 and DTaP.  His next vaccination will be due in September 2016.

## 2014-10-25 NOTE — Assessment & Plan Note (Signed)
I did not feel splenomegaly.  PET/CT scan showed arthritis changes only. The patient is reassured.

## 2014-10-25 NOTE — Progress Notes (Signed)
Vaccines given by the flush nurse.

## 2014-10-25 NOTE — Progress Notes (Signed)
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 In addition, there is moderate splenomegaly with associated hypermetabolic activity consistent with lymphomatous involvement   09/21/2013 - 01/26/2014 Chemotherapy The patient received 6 cycles of bendamustine and rituximab   01/24/2014 Imaging Repeat PET/CT scan showed near complete response to treatment.   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 The patient returns for further follow-up. He continues to have intermittent  left shoulder pain. His prior symptoms of hiccups and reflux had resolved. He is eating well and is gaining weight. Denies recent infection.  REVIEW OF SYSTEMS:   Constitutional: Denies fevers, chills or abnormal weight loss Eyes: Denies blurriness of vision Ears, nose, mouth, throat, and face: Denies mucositis or sore throat Respiratory: Denies cough, dyspnea or wheezes Cardiovascular: Denies palpitation, chest discomfort or lower extremity swelling Gastrointestinal:  Denies nausea, heartburn or change in bowel habits Skin: Denies abnormal skin rashes Lymphatics: Denies new lymphadenopathy or easy bruising Neurological:Denies numbness, tingling or new weaknesses Behavioral/Psych: Mood is stable, no new changes  All other systems were reviewed with the patient and are negative.  I have reviewed the past medical history, past surgical history, social history and family history with the patient and they are unchanged from previous note.  ALLERGIES:  has No Known Allergies.  MEDICATIONS:  Current Outpatient Prescriptions  Medication Sig Dispense Refill  . acetaminophen (TYLENOL) 500 MG tablet Take 500 mg by mouth every 6 (six) hours as needed for moderate pain or fever.     Marland Kitchen acyclovir (ZOVIRAX) 400 MG tablet Take 1 tablet (400 mg total) by mouth daily. (Patient taking differently: Take 400 mg by mouth 2 (two) times daily. ) 90 tablet 3  . allopurinol (ZYLOPRIM) 300 MG tablet Take 300 mg by mouth daily.    . cholecalciferol (VITAMIN D) 1000 UNITS tablet Take 2,000 Units by mouth every morning.    . ibrutinib (IMBRUVICA) 140 MG capsul Take 4 capsules (560 mg total) by mouth daily. 120 capsule 6  . levothyroxine (SYNTHROID, LEVOTHROID) 75 MCG tablet TAKE 1 TABLET EVERY DAY 90 tablet 6  . loratadine (CLARITIN) 10  MG tablet Take 10 mg by mouth every morning.    . Multiple Vitamin tablet Take 1 tablet by mouth.    . hydrochlorothiazide (HYDRODIURIL) 12.5 MG tablet Take 1 tablet (12.5 mg  total) by mouth daily. 30 tablet 3  . oxyCODONE (OXY IR/ROXICODONE) 5 MG immediate release tablet Take 1 tablet (5 mg total) by mouth every 4 (four) hours as needed for severe pain. (Patient not taking: Reported on 10/25/2014) 60 tablet 0   No current facility-administered medications for this visit.   Facility-Administered Medications Ordered in Other Visits  Medication Dose Route Frequency Provider Last Rate Last Dose  . DTaP-hepatitis B recombinant-IPV (PEDIARIX) injection 0.5 mL  0.5 mL Intramuscular Once Heath Lark, MD      . haemophilus B polysaccharide conjugate vaccine (ActHIB) injection 0.5 mL  0.5 mL Intramuscular Once Heath Lark, MD      . heparin lock flush 100 unit/mL  500 Units Intravenous Once Heath Lark, MD      . hepatitis B vaccine (ENGERIX-B) injection 20 mcg  1 mL Intramuscular Once Heath Lark, MD      . meningococcal vaccine (MENOMUNE) injection 0.5 mL  0.5 mL Subcutaneous Once Heath Lark, MD      . pneumococcal 13-valent conjugate vaccine (PREVNAR 13) injection 0.5 mL  0.5 mL Intramuscular Once Heath Lark, MD      . poliovirus vaccine (inactivated) (IPOL) injection 0.5 mL  0.5 mL Subcutaneous Once Heath Lark, MD      . sodium chloride 0.9 % injection 10 mL  10 mL Intravenous PRN Heath Lark, MD        PHYSICAL EXAMINATION: ECOG PERFORMANCE STATUS: 0 - Asymptomatic  Filed Vitals:   10/25/14 1150  BP: 153/67  Pulse: 75  Temp: 97.8 F (36.6 C)  Resp: 18   Filed Weights   10/25/14 1150  Weight: 210 lb 3.2 oz (95.346 kg)    GENERAL:alert, no distress and comfortable SKIN: skin color, texture, turgor are normal, no rashes or significant lesions EYES: normal, Conjunctiva are pink and non-injected, sclera clear OROPHARYNX:no exudate, no erythema and lips, buccal mucosa, and tongue normal  NECK: supple, thyroid normal size, non-tender, without nodularity LYMPH:  no palpable lymphadenopathy in the cervical, axillary or inguinal LUNGS: clear to auscultation and  percussion with normal breathing effort HEART: regular rate & rhythm and no murmurs and no lower extremity edema ABDOMEN:abdomen soft, non-tender and normal bowel sounds Musculoskeletal:no cyanosis of digits and no clubbing  NEURO: alert & oriented x 3 with fluent speech, no focal motor/sensory deficits  LABORATORY DATA:  I have reviewed the data as listed    Component Value Date/Time   NA 147* 09/25/2014 1432   NA 140 08/22/2014 0406   K 4.3 09/25/2014 1432   K 4.3 08/22/2014 0406   CL 109 08/22/2014 0406   CO2 26 09/25/2014 1432   CO2 24 08/22/2014 0406   GLUCOSE 91 09/25/2014 1432   GLUCOSE 117* 08/22/2014 0406   BUN 14.3 09/25/2014 1432   BUN 32* 08/22/2014 0406   CREATININE 1.1 09/25/2014 1432   CREATININE 1.24 08/22/2014 0406   CALCIUM 9.4 09/25/2014 1432   CALCIUM 8.7 08/22/2014 0406   PROT 6.5 09/25/2014 1432   PROT 5.6* 08/22/2014 0406   ALBUMIN 3.7 09/25/2014 1432   ALBUMIN 3.3* 08/22/2014 0406   AST 19 09/25/2014 1432   AST 24 08/22/2014 0406   ALT 19 09/25/2014 1432   ALT 40 08/22/2014 0406   ALKPHOS 80 09/25/2014 1432   ALKPHOS  70 08/22/2014 0406   BILITOT 0.80 09/25/2014 1432   BILITOT 0.7 08/22/2014 0406   GFRNONAA 56* 08/22/2014 0406   GFRAA 65* 08/22/2014 0406    No results found for: SPEP, UPEP  Lab Results  Component Value Date   WBC 6.2 10/25/2014   NEUTROABS 3.4 10/25/2014   HGB 12.7* 10/25/2014   HCT 36.5* 10/25/2014   MCV 91.7 10/25/2014   PLT 166 10/25/2014      Chemistry      Component Value Date/Time   NA 147* 09/25/2014 1432   NA 140 08/22/2014 0406   K 4.3 09/25/2014 1432   K 4.3 08/22/2014 0406   CL 109 08/22/2014 0406   CO2 26 09/25/2014 1432   CO2 24 08/22/2014 0406   BUN 14.3 09/25/2014 1432   BUN 32* 08/22/2014 0406   CREATININE 1.1 09/25/2014 1432   CREATININE 1.24 08/22/2014 0406      Component Value Date/Time   CALCIUM 9.4 09/25/2014 1432   CALCIUM 8.7 08/22/2014 0406   ALKPHOS 80 09/25/2014 1432   ALKPHOS 70  08/22/2014 0406   AST 19 09/25/2014 1432   AST 24 08/22/2014 0406   ALT 19 09/25/2014 1432   ALT 40 08/22/2014 0406   BILITOT 0.80 09/25/2014 1432   BILITOT 0.7 08/22/2014 0406       RADIOGRAPHIC STUDIES: I have personally reviewed the radiological images as listed and agreed with the findings in the report. Nm Pet Image Restag (ps) Skull Base To Thigh  10/24/2014   CLINICAL DATA:  Subsequent treatment strategy for mantle cell lymphoma. Left shoulder pain.  EXAM: NUCLEAR MEDICINE PET SKULL BASE TO THIGH  TECHNIQUE: 10.0 mCi F-18 FDG was injected intravenously. Full-ring PET imaging was performed from the skull base to thigh after the radiotracer. CT data was obtained and used for attenuation correction and anatomic localization.  FASTING BLOOD GLUCOSE:  Value: 80 mg/dl  COMPARISON:  PET-CT 07/13/2014, 6 11/20/2013, CT exam 08/10/2014  FINDINGS: NECK  No hypermetabolic lymph nodes in the neck.  CHEST  No hypermetabolic mediastinal or hilar nodes. No suspicious pulmonary nodules on the CT scan.  ABDOMEN/PELVIS  Interval reduction in volume of the spleen with calculated volume of 555 cubic cm decreased from 1666 cubic cm. The spleen is isometabolic to the liver which is normal.  Interval reduction in the mild periaortic and central mesenteric adenopathy described on comparison CT. There is no hypermetabolic lymph nodes in the abdomen or pelvis.  SKELETON  There is a single focus of hypermetabolic activity associated the medial aspect of the left fourth rib with a focus of sclerosis on the CT portion (image 89, series 4). This is at the costochondral junction.  IMPRESSION: 1. Marked reduction in splenomegaly. 2. No hypermetabolic lymph nodes on the skull base to thigh scan. 3. Small hypermetabolic sclerotic focus at the anterior medial left fourth rib . This is a common location for skeletal trauma.   Electronically Signed   By: Suzy Bouchard M.D.   On: 10/24/2014 11:35     ASSESSMENT & PLAN:   Mantle cell lymphoma  He has excellent response to treatment. I will continue the same without dose adjustment. I will see him back in 3 months to repeat history, physical examination and blood work and repeat imaging in 6 months.  I will flush his port today. The patient is interested to see if he can get his port flush in 6 weeks near his home in [facility]. His daughter will call me with information  and will can try to arrange with his local providers to see if we can get his port flushed then.   Anemia in neoplastic disease This is likely due to recent treatment. The patient denies recent history of bleeding such as epistaxis, hematuria or hematochezia. He is asymptomatic from the anemia. I will observe for now.  He does not require transfusion now. I will continue the chemotherapy at current dose without dosage adjustment.  If the anemia gets progressive worse in the future, I might have to delay his treatment or adjust the chemotherapy dose.    Left shoulder pain I did not feel splenomegaly.  PET/CT scan showed arthritis changes only. The patient is reassured.   Essential hypertension  He has started to become hypertensive due to weight gain. I recommend low-dose hydrochlorothiazide and follow-up with PCP for blood pressure management.   S/P autologous bone marrow transplantation The patient is 6 months out from bone marrow transplant He declined returning back to [facility] for follow-up because the bone marrow Did Not Work for [employer]. He Is scheduled today to get his 6 months post transplant vaccination. Today, he will get hepatitis B, Haemophilus influenza B, inactivated polio, meningococcal conjugate , Prevnar 13 and DTaP.  His next vaccination will be due in September 2016.    No orders of the defined types were placed in this encounter.   All questions were answered. The patient knows to call the clinic with any problems, questions or concerns. No barriers to learning was  detected. I spent 30 minutes counseling the patient face to face. The total time spent in the appointment was 40 minutes and more than 50% was on counseling and review of test results     [facility], [place], MD 10/25/2014 1:09 PM

## 2014-10-26 ENCOUNTER — Telehealth: Payer: Self-pay | Admitting: *Deleted

## 2014-10-26 NOTE — Telephone Encounter (Signed)
Dr. Shirleen Schirmer called to request patient's most recent office note.  Dr. Calton Dach note of 10/25/14 faxed to [phone].

## 2014-10-29 ENCOUNTER — Telehealth: Payer: Self-pay | Admitting: Hematology and Oncology

## 2014-10-29 NOTE — Telephone Encounter (Signed)
Faxed pt medical records to Dr. Hardin Negus @ [facility] [phone]

## 2014-11-05 ENCOUNTER — Encounter: Payer: Self-pay | Admitting: *Deleted

## 2014-11-05 NOTE — Progress Notes (Signed)
Fax received from [employer]. They will ship pt's Imbruvica to him for delivery on 11/06/14.

## 2014-11-15 ENCOUNTER — Telehealth: Payer: Self-pay | Admitting: *Deleted

## 2014-11-15 NOTE — Telephone Encounter (Signed)
Please get patient to take pictures and PCP evaluation Risk of rash is small, unlikely due to drugs

## 2014-11-15 NOTE — Telephone Encounter (Signed)
[facility]lin faxed report for IMBRUVICA 140 mg.  "spoke with patient Manuel [name].  He started itching 2 weeks ago on his chest and back.  This has been reported to [employer]."  Will notify Dr. Alvy Bimler.  Next scheduled F/U is 01-24-2015

## 2014-11-15 NOTE — Telephone Encounter (Signed)
Called patient and spoke with daughter, Brennan Bailey who is P.O.A.  Dr. Calton Dach instructions given.  "Take pictures!  Dad is at the beach, I talk with him daily.  I do not know why he told the [facility] lady he has been itching.  He normally has dry skin.  I tell him use lotion and he doesn't.  Thanks for letting me know.  I need to get him to see his PCP anyway."  Advised to take pictures to ensure PCP see's for best evaluation in case rash changes.

## 2014-11-19 ENCOUNTER — Telehealth: Payer: Self-pay | Admitting: *Deleted

## 2014-11-19 NOTE — Telephone Encounter (Signed)
Informed pt I left VM for [facility] about PAC flush and likely he would have to be referred and see Oncologist there in order to have his PAC flushed there.  Also informed him I had faxed Rx for PAC flush to his PCP office in March as there was a nurse, Verdene Lennert, who said they could arrange for him to have his PAC flushed at the local hospital.  Pt states he will check w/ Dr. Burnett Sheng office about this.  Informed pt Dr. Alvy Bimler suggests he try Benadryl 25 mg q 6 hrs prn for itching and rash. He verbalized understanding. He says he has felt very fatigued and sob for past month.  He is going to have labs done at his PCP this week and says it will be "the full panel." Pt also reports diarrhea stools 3 to 4 times per week.  He is taking probiotic daily. He also has nausea daily and takes compazine about once or twice a week.  He needs refill on compazine sent to the [facility] in [place], [place]. Discussed w/ pt his symptoms such as fatigue and itching can be side effect of Ibrutinib.  Discussed his sob may be from being deconditioned.  Encouraged him to keep appt w/ PCP to assess all these symptoms.  Pt verbalized understanding that the "chemo pill" can cause side effects but he also understands it is "working" for his cancer and he is willing to tolerate the side effects if it continues to help his cancer.

## 2014-11-19 NOTE — Telephone Encounter (Signed)
Yes, pls, 60 tabs 3 refills

## 2014-11-19 NOTE — Telephone Encounter (Signed)
TC from pt's daughter Brennan Bailey. She states she needs several things from Dr. Alvy Bimler. Relayed the following information to Cameo, RN with Dr. Alvy Bimler: 1. PAC flush referral needed for May 2nd @ [facility], [address] [place], [place]  2. [facility] is concerned that pt has rash on chest and back and that it may be related to to his ibrutinib (IMBRUVICA) 140 MG capssule  3. Patient c/o increased fatigue and SOB. He is making an appt. With his PCP in [facility] for this week. Dr. Harvel Ricks. Daughter wants whole gamut of labs drawn (labs Dr. Alvy Bimler usually gets here)

## 2014-11-19 NOTE — Telephone Encounter (Signed)
Pt requesting referral to have his PAC flushed at [facility] in [place], [place] ph [phone].  I called and left VM for nurse navigator to ask if it can be arranged for pt to come there just for port a cath flush or would he need to also be seen by MD there?

## 2014-11-20 ENCOUNTER — Telehealth: Payer: Self-pay | Admitting: *Deleted

## 2014-11-20 MED ORDER — PROCHLORPERAZINE MALEATE 10 MG PO TABS: 10.0000 mg | ORAL_TABLET | Freq: Four times a day (QID) | ORAL | Status: DC | PRN

## 2014-11-20 NOTE — Telephone Encounter (Signed)
Informed pt of Compazine refill sent to [facility] on [facility] in [place], [place].  Pt verbalized understanding and states he has been so tired today he has not been able to get out of the house and has spent all day in bed.  Pt says he is supposed to get his labwork done at PCP sometime this week and he may go on Friday.  Encouraged pt to go sooner since he is not feeling well.  He verbalized understanding.

## 2014-11-28 ENCOUNTER — Telehealth: Payer: Self-pay | Admitting: *Deleted

## 2014-11-28 NOTE — Telephone Encounter (Signed)
Faxed order for PAC flush to [facility].

## 2014-11-30 ENCOUNTER — Telehealth: Payer: Self-pay | Admitting: *Deleted

## 2014-11-30 NOTE — Telephone Encounter (Signed)
Imbruvica to be delivered on 12/04/14

## 2014-12-28 ENCOUNTER — Telehealth: Payer: Self-pay | Admitting: *Deleted

## 2014-12-28 NOTE — Telephone Encounter (Signed)
Fax from [facility] shipped for delivery 12/28/14

## 2015-01-01 ENCOUNTER — Telehealth: Payer: Self-pay | Admitting: *Deleted

## 2015-01-01 NOTE — Telephone Encounter (Signed)
Patient's daughter Manuel [name] called to confirm June appointment and report symptoms Dad is having requesting information to help.  Lab/flush/MD appointment 01-24-2015 beginning at 0900.  Symptoms reported are "Fatigue is progressively getting worse.  He's so tired he can't drive to go to the grocery store or here from [facility].  I will get him the week before and take him back.  Leg cramps started two weeks ago and are an every night occurrence.  He's not getting any sleep as the cramps keep him up all night.  This may be the cause of the fatigue.  He has lost his appetite.  Eats very little, only half portions.  He hasn't weighed himself and doesn't know if he's lost weight.  He lives alone with two siblings checking in on him from time to time."  Will notify Dr. Alvy Bimler.  Return number [phone].    This nurse did share Imbruvica side effects show 41% are ftigued, 21% have decreased appetite and some experience spasms or other musculoskeletal symptoms.  "His taste buds are super sensitive and he refuses these supplements because they're too sweet."  Suggested ensure or boost in recipes, smoothies, deserts.

## 2015-01-01 NOTE — Telephone Encounter (Signed)
Can you call and see if he wants to move his appt sooner? I can see him next week on 6/8

## 2015-01-01 NOTE — Telephone Encounter (Signed)
Informed daughter Dr. Alvy Bimler can see pt sooner on 6/8. She will check with pt and let us know tomorrow if he wants to move appt up to 6/8.  Otherwise they will keep appt as scheduled on 6/23.  Daughter has a feeling pt will not want to change his appt but will call us back if he does.

## 2015-01-23 ENCOUNTER — Telehealth: Payer: Self-pay | Admitting: Hematology and Oncology

## 2015-01-23 NOTE — Telephone Encounter (Signed)
returned call and no vm set up.Marland KitchenMarland Kitchen

## 2015-01-23 NOTE — Telephone Encounter (Signed)
s.w. pt dtr and confirmed appts

## 2015-01-24 ENCOUNTER — Ambulatory Visit: Payer: Medicare PPO

## 2015-01-24 ENCOUNTER — Other Ambulatory Visit: Payer: Self-pay | Admitting: Hematology and Oncology

## 2015-01-24 ENCOUNTER — Other Ambulatory Visit (HOSPITAL_BASED_OUTPATIENT_CLINIC_OR_DEPARTMENT_OTHER): Payer: Medicare PPO

## 2015-01-24 ENCOUNTER — Encounter: Payer: Self-pay | Admitting: Hematology and Oncology

## 2015-01-24 ENCOUNTER — Telehealth: Payer: Self-pay | Admitting: *Deleted

## 2015-01-24 ENCOUNTER — Encounter: Payer: Self-pay | Admitting: *Deleted

## 2015-01-24 ENCOUNTER — Telehealth: Payer: Self-pay | Admitting: Hematology and Oncology

## 2015-01-24 ENCOUNTER — Ambulatory Visit (HOSPITAL_BASED_OUTPATIENT_CLINIC_OR_DEPARTMENT_OTHER): Payer: Medicare PPO | Admitting: Hematology and Oncology

## 2015-01-24 ENCOUNTER — Ambulatory Visit (HOSPITAL_COMMUNITY)
Admission: RE | Admit: 2015-01-24 | Discharge: 2015-01-24 | Disposition: A | Discharge status: Home / Self Care | Payer: Medicare PPO | Source: Ambulatory Visit | Attending: Hematology and Oncology | Admitting: Hematology and Oncology

## 2015-01-24 VITALS — BP 140/56 | HR 69 | Temp 97.8°F | Resp 18 | Ht 75.0 in | Wt 204.9 lb

## 2015-01-24 DIAGNOSIS — D630 Anemia in neoplastic disease: Secondary | ICD-10-CM

## 2015-01-24 DIAGNOSIS — C8310 Mantle cell lymphoma, unspecified site: Secondary | ICD-10-CM

## 2015-01-24 DIAGNOSIS — E785 Hyperlipidemia, unspecified: Secondary | ICD-10-CM | POA: Insufficient documentation

## 2015-01-24 DIAGNOSIS — I10 Essential (primary) hypertension: Secondary | ICD-10-CM | POA: Insufficient documentation

## 2015-01-24 DIAGNOSIS — Z87891 Personal history of nicotine dependence: Secondary | ICD-10-CM | POA: Diagnosis not present

## 2015-01-24 DIAGNOSIS — Z01818 Encounter for other preprocedural examination: Secondary | ICD-10-CM | POA: Diagnosis present

## 2015-01-24 DIAGNOSIS — Z95828 Presence of other vascular implants and grafts: Secondary | ICD-10-CM

## 2015-01-24 DIAGNOSIS — R161 Splenomegaly, not elsewhere classified: Secondary | ICD-10-CM

## 2015-01-24 DIAGNOSIS — Z5111 Encounter for antineoplastic chemotherapy: Secondary | ICD-10-CM

## 2015-01-24 DIAGNOSIS — D696 Thrombocytopenia, unspecified: Secondary | ICD-10-CM | POA: Diagnosis not present

## 2015-01-24 DIAGNOSIS — M1000 Idiopathic gout, unspecified site: Secondary | ICD-10-CM

## 2015-01-24 LAB — CBC WITH DIFFERENTIAL/PLATELET
BASO%: 1 % (ref 0.0–2.0)
BASOS ABS: 0.1 10*3/uL (ref 0.0–0.1)
EOS ABS: 0.7 10*3/uL — AB (ref 0.0–0.5)
EOS%: 7.2 % — AB (ref 0.0–7.0)
HCT: 29 % — ABNORMAL LOW (ref 38.4–49.9)
HGB: 9.8 g/dL — ABNORMAL LOW (ref 13.0–17.1)
LYMPH%: 74.5 % — AB (ref 14.0–49.0)
MCH: 31.1 pg (ref 27.2–33.4)
MCHC: 33.6 g/dL (ref 32.0–36.0)
MCV: 92.4 fL (ref 79.3–98.0)
MONO#: 0.9 10*3/uL (ref 0.1–0.9)
MONO%: 9.4 % (ref 0.0–14.0)
NEUT#: 0.8 10*3/uL — ABNORMAL LOW (ref 1.5–6.5)
NEUT%: 7.9 % — ABNORMAL LOW (ref 39.0–75.0)
PLATELETS: 53 10*3/uL — AB (ref 140–400)
RBC: 3.14 10*6/uL — AB (ref 4.20–5.82)
RDW: 15.3 % — ABNORMAL HIGH (ref 11.0–14.6)
WBC: 10 10*3/uL (ref 4.0–10.3)
lymph#: 7.5 10*3/uL — ABNORMAL HIGH (ref 0.9–3.3)

## 2015-01-24 LAB — URIC ACID (CC13): Uric Acid, Serum: 15.7 mg/dL — ABNORMAL HIGH (ref 2.6–7.4)

## 2015-01-24 LAB — COMPREHENSIVE METABOLIC PANEL (CC13)
ALBUMIN: 3.6 g/dL (ref 3.5–5.0)
ALT: 14 U/L (ref 0–55)
ANION GAP: 8 meq/L (ref 3–11)
AST: 20 U/L (ref 5–34)
Alkaline Phosphatase: 96 U/L (ref 40–150)
BUN: 17.6 mg/dL (ref 7.0–26.0)
CALCIUM: 8.5 mg/dL (ref 8.4–10.4)
CO2: 24 mEq/L (ref 22–29)
Chloride: 109 mEq/L (ref 98–109)
Creatinine: 1.3 mg/dL (ref 0.7–1.3)
EGFR: 52 mL/min/{1.73_m2} — AB (ref >90)
Glucose: 77 mg/dl (ref 70–140)
Potassium: 4.1 mEq/L (ref 3.5–5.1)
SODIUM: 142 meq/L (ref 136–145)
Total Bilirubin: 0.73 mg/dL (ref 0.20–1.20)
Total Protein: 5.8 g/dL — ABNORMAL LOW (ref 6.4–8.3)

## 2015-01-24 LAB — LACTATE DEHYDROGENASE (CC13): LDH: 327 U/L — ABNORMAL HIGH (ref 125–245)

## 2015-01-24 LAB — TECHNOLOGIST REVIEW

## 2015-01-24 LAB — HEPATITIS B SURFACE ANTIGEN: HEP B S AG: NEGATIVE

## 2015-01-24 LAB — HEPATITIS B SURFACE ANTIBODY,QUALITATIVE: Hep B S Ab: NEGATIVE

## 2015-01-24 LAB — HEPATITIS B CORE ANTIBODY, IGM: Hep B C IgM: NONREACTIVE

## 2015-01-24 MED ORDER — SODIUM CHLORIDE 0.9 % IJ SOLN
10.0000 mL | INTRAMUSCULAR | Status: DC | PRN
  Administered 2015-01-24: 10 mL via INTRAVENOUS
  Filled 2015-01-24: qty 10

## 2015-01-24 MED ORDER — HEPARIN SOD (PORK) LOCK FLUSH 100 UNIT/ML IV SOLN
500.0000 [IU] | Freq: Once | INTRAVENOUS | Status: AC
Start: 2015-01-24 — End: 2015-01-24
  Administered 2015-01-24: 500 [IU] via INTRAVENOUS
  Filled 2015-01-24: qty 5

## 2015-01-24 MED ORDER — ALLOPURINOL 300 MG PO TABS: 300.0000 mg | ORAL_TABLET | Freq: Every day | ORAL | Status: DC

## 2015-01-24 MED ORDER — PREDNISONE 20 MG PO TABS: 60.0000 mg | ORAL_TABLET | Freq: Every day | ORAL | Status: DC

## 2015-01-24 NOTE — Assessment & Plan Note (Addendum)
Unfortunately, with his symptoms of splenomegaly, profuse night sweats and significant pancytopenia, there is no doubt in my mind that he have relapsed/recurrent mantle cell lymphoma. 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 He had minimal benefit with transplant and has a short course of benefit with Ibrutinib. I told him to stop the Iburitinib immediately. I will order staging CT scan of the chest, abdomen and pelvis with contrast and echocardiogram. Due to the urgency and aggressive course of his disease, I will start him on prednisone 60 mg now and plan to admit him to the hospital on 01/28/2015 for R-EPOCH as salvage treatment. If we can get good response to treatment, I will consult [facility] again for potential second opinion. 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 I might try to get him compassionate use of this.  We discussed the role of chemotherapy. The intent is for palliative.  We discussed some of the risks, benefits, side-effects of Rituximab, Cytoxan, Adriamycin, Prednisone, Etoposide and vincristine. Due to high risk disease burden, I plan to start rituximab on day 2 instead of day 1 and to cap maximum infusion rate at 100 ml/hour I have asked him to start allopurinol. While in the hospital, will give him rasburicase.   Some of the short term side-effects included, though not limited to, risk of fatigue, risk of allergic reactions, mouth sores, weight loss, pancytopenia, life-threatening infections, need for transfusions of blood products, nausea, vomiting, change in bowel habits, blood clots, admission to hospital for various reasons, and risks of death.   Long term side-effects are also discussed including risks of infertility, permanent damage to nerve function, chronic fatigue, and rare secondary malignancy including bone marrow disorders and leukemia.    The patient is aware that the response rates discussed earlier is not guaranteed.  After a long discussion, patient made an informed decision to proceed with the prescribed plan of care and went ahead to sign the consent form today.   Patient education materials were dispensed today

## 2015-01-24 NOTE — Progress Notes (Signed)
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 In addition, there is moderate splenomegaly with associated hypermetabolic activity consistent with lymphomatous involvement   09/21/2013 - 01/26/2014 Chemotherapy The patient received 6 cycles of bendamustine and rituximab   01/24/2014 Imaging Repeat PET/CT scan showed near complete response to treatment.   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 He is seen as part of his 3 months follow-up. Over the past month, he has been  having decline in energy, daily night sweats, left upper quadrant pain/discomfort with poor appetite. He continues to experience intermittent pain in the left shoulder area with poor energy level. He denies palpable lymphadenopathy.  REVIEW OF SYSTEMS:   Constitutional: Denies fevers, chills or abnormal weight loss Eyes: Denies blurriness of vision Ears, nose, mouth, throat, and face: Denies mucositis or sore throat Respiratory: Denies cough, dyspnea or wheezes Cardiovascular: Denies palpitation, chest discomfort or lower extremity swelling Gastrointestinal:  Denies nausea, heartburn or change in bowel habits Skin: Denies abnormal skin rashes Lymphatics: Denies new lymphadenopathy or easy bruising Neurological:Denies numbness, tingling or new weaknesses Behavioral/Psych: Mood is stable, no new changes  All other systems were reviewed with the patient and are negative.  I have reviewed the past medical history, past surgical history, social history and family history with the patient and they are unchanged from previous note.  ALLERGIES:  has No Known Allergies.  MEDICATIONS:  Current Outpatient Prescriptions  Medication Sig Dispense Refill  . acetaminophen (TYLENOL) 500 MG tablet Take 500 mg by mouth every 6 (six) hours as needed for moderate pain or fever.     Marland Kitchen acyclovir (ZOVIRAX) 400 MG tablet Take 1 tablet (400 mg total) by mouth daily. 90 tablet 3  . cholecalciferol (VITAMIN D) 1000 UNITS tablet Take 2,000 Units by mouth every morning.    . hydrochlorothiazide (HYDRODIURIL) 12.5 MG tablet Take 1 tablet (12.5 mg total) by mouth daily. 30 tablet 3  . ibrutinib (IMBRUVICA) 140 MG capsul Take 4 capsules (560 mg total) by mouth daily. 120 capsule 6  . levothyroxine (SYNTHROID, LEVOTHROID) 75 MCG tablet TAKE 1 TABLET EVERY DAY 90  tablet 6  . loratadine (CLARITIN) 10 MG tablet Take 10 mg by mouth every morning.    . Multiple Vitamin tablet Take 1 tablet by mouth.    . prochlorperazine  (COMPAZINE) 10 MG tablet Take 1 tablet (10 mg total) by mouth every 6 (six) hours as needed for nausea or vomiting. 60 tablet 3  . allopurinol (ZYLOPRIM) 300 MG tablet Take 1 tablet (300 mg total) by mouth daily. 30 tablet 0  . predniSONE (DELTASONE) 20 MG tablet Take 3 tablets (60 mg total) by mouth daily with breakfast. 30 tablet 0   No current facility-administered medications for this visit.    PHYSICAL EXAMINATION: ECOG PERFORMANCE STATUS: 1 - Symptomatic but completely ambulatory  Filed Vitals:   01/24/15 0917  BP: 140/56  Pulse: 69  Temp: 97.8 F (36.6 C)  Resp: 18   Filed Weights   01/24/15 0917  Weight: 204 lb 14.4 oz (92.942 kg)    GENERAL:alert, no distress and comfortable SKIN: skin color, texture, turgor are normal, no rashes or significant lesions EYES: normal, Conjunctiva are pink and non-injected, sclera clear OROPHARYNX:no exudate, no erythema and lips, buccal mucosa, and tongue normal  NECK: supple, thyroid normal size, non-tender, without nodularity LYMPH:  Palpable lymphadenopathy in the right axilla. LUNGS: clear to auscultation and percussion with normal breathing effort HEART: regular rate & rhythm and no murmurs and no lower extremity edema ABDOMEN:abdomen soft, non-tender and normal bowel sounds. Palpable splenomegaly Musculoskeletal:no cyanosis of digits and no clubbing  NEURO: alert & oriented x 3 with fluent speech, no focal motor/sensory deficits  LABORATORY DATA:  I have reviewed the data as listed    Component Value Date/Time   NA 142 01/24/2015 0854   NA 140 08/22/2014 0406   K 4.1 01/24/2015 0854   K 4.3 08/22/2014 0406   CL 109 08/22/2014 0406   CO2 24 01/24/2015 0854   CO2 24 08/22/2014 0406   GLUCOSE 77 01/24/2015 0854   GLUCOSE 117* 08/22/2014 0406   BUN 17.6 01/24/2015 0854   BUN 32* 08/22/2014 0406   CREATININE 1.3 01/24/2015 0854   CREATININE 1.24 08/22/2014 0406   CALCIUM 8.5 01/24/2015 0854   CALCIUM 8.7 08/22/2014 0406    PROT 5.8* 01/24/2015 0854   PROT 5.6* 08/22/2014 0406   ALBUMIN 3.6 01/24/2015 0854   ALBUMIN 3.3* 08/22/2014 0406   AST 20 01/24/2015 0854   AST 24 08/22/2014 0406   ALT 14 01/24/2015 0854   ALT 40 08/22/2014 0406   ALKPHOS 96 01/24/2015 0854   ALKPHOS 70 08/22/2014 0406   BILITOT 0.73 01/24/2015 0854   BILITOT 0.7 08/22/2014 0406   GFRNONAA 56* 08/22/2014 0406   GFRAA 65* 08/22/2014 0406    No results found for: SPEP, UPEP  Lab Results  Component Value Date   WBC 10.0 01/24/2015   NEUTROABS 0.8* 01/24/2015   HGB 9.8* 01/24/2015   HCT 29.0* 01/24/2015   MCV 92.4 01/24/2015   PLT 53* 01/24/2015      Chemistry      Component Value Date/Time   NA 142 01/24/2015 0854   NA 140 08/22/2014 0406   K 4.1 01/24/2015 0854   K 4.3 08/22/2014 0406   CL 109 08/22/2014 0406   CO2 24 01/24/2015 0854   CO2 24 08/22/2014 0406   BUN 17.6 01/24/2015 0854   BUN 32* 08/22/2014 0406   CREATININE 1.3 01/24/2015 0854   CREATININE 1.24 08/22/2014 0406      Component Value Date/Time   CALCIUM 8.5  01/24/2015 0854   CALCIUM 8.7 08/22/2014 0406   ALKPHOS 96 01/24/2015 0854   ALKPHOS 70 08/22/2014 0406   AST 20 01/24/2015 0854   AST 24 08/22/2014 0406   ALT 14 01/24/2015 0854   ALT 40 08/22/2014 0406   BILITOT 0.73 01/24/2015 0854   BILITOT 0.7 08/22/2014 0406      ASSESSMENT & PLAN:  Mantle cell lymphoma Unfortunately, with his symptoms of splenomegaly, profuse night sweats and significant pancytopenia, there is no doubt in my mind that he have relapsed/recurrent mantle cell lymphoma. 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 He had minimal benefit with transplant and has a short course of benefit with Ibrutinib. I told him to stop the Iburitinib immediately. I will order staging CT scan of the chest, abdomen and pelvis with contrast and echocardiogram. Due to the urgency and aggressive course of his  disease, I will start him on prednisone 60 mg now and plan to admit him to the hospital on 01/28/2015 for R-EPOCH as salvage treatment. If we can get good response to treatment, I will consult [facility] again for potential second opinion. 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 I might try to get him compassionate use of this.  We discussed the role of chemotherapy. The intent is for palliative.  We discussed some of the risks, benefits, side-effects of Rituximab, Cytoxan, Adriamycin, Prednisone, Etoposide and vincristine. Due to high risk disease burden, I plan to start rituximab on day 2 instead of day 1. I have asked him to start allopurinol. While in the hospital, will give him rasburicase.   Some of the short term side-effects included, though not limited to, risk of fatigue, risk of allergic reactions, mouth sores, weight loss, pancytopenia, life-threatening infections, need for transfusions of blood products, nausea, vomiting, change in bowel habits, blood clots, admission to hospital for various reasons, and risks of death.   Long term side-effects are also discussed including risks of infertility, permanent damage to nerve function, chronic fatigue, and rare secondary malignancy including bone marrow disorders and leukemia.   The patient is aware that the response rates discussed earlier is not guaranteed.  After a long discussion, patient made an informed decision to proceed with the prescribed plan of care and went ahead to sign the consent form today.   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 The patient denies recent history of bleeding such as epistaxis, hematuria or hematochezia. He is asymptomatic from the anemia. We will observe for now.  He does not require transfusion now.    Thrombocytopenia This is due to splenomegaly and  relapsed disease. I recommend he hold aspirin.   Orders Placed This Encounter  Procedures  . CT Chest W Contrast    Standing Status: Future     Number of Occurrences:      Standing Expiration Date: 03/25/2016    Order Specific Question:  Reason for Exam (SYMPTOM  OR DIAGNOSIS REQUIRED)    Answer:  staging relapsed mantle cell lymphoma    Order Specific Question:  Preferred imaging location?    Answer:  [facility]  . CT Abdomen Pelvis W Contrast    Standing Status: Future     Number of Occurrences:      Standing Expiration Date: 04/25/2016    Order Specific Question:  Reason for Exam (  SYMPTOM  OR DIAGNOSIS REQUIRED)    Answer:  staging relapsed mantle cell lymphoma    Order Specific Question:  Preferred imaging location?    Answer:  [facility]  . Uric Acid    Standing Status: Future     Number of Occurrences: 1     Standing Expiration Date: 02/28/2016  . Hepatitis B core antibody, IgM    Standing Status: Future     Number of Occurrences: 1     Standing Expiration Date: 02/28/2016  . Hepatitis B surface antibody    Standing Status: Future     Number of Occurrences: 1     Standing Expiration Date: 02/28/2016  . Hepatitis B surface antigen    Standing Status: Future     Number of Occurrences: 1     Standing Expiration Date: 02/28/2016  . ECHOCARDIOGRAM COMPLETE    Standing Status: Future     Number of Occurrences:      Standing Expiration Date: 04/24/2016    Order Specific Question:  Where should this test be performed    Answer:  [facility]    Order Specific Question:  Complete or Limited study?    Answer:  Complete    Order Specific Question:  With Image Enhancing Agent or without Image Enhancing Agent?    Answer:  Without Image Enhancing Agent    Order Specific Question:  Contraindication for Image Enhancing Agent?    Answer:  Per Appointment Conversion    Order Specific Question:  Reason for exam-Echo    Answer:  Chemo  V67.2 / Z09   All questions  were answered. The patient knows to call the clinic with any problems, questions or concerns. No barriers to learning was detected. I spent 40 minutes counseling the patient face to face. The total time spent in the appointment was 55 minutes and more than 50% was on counseling and review of test results     [facility], Jacobs Golab, MD 01/24/2015 10:34 AM

## 2015-01-24 NOTE — Patient Instructions (Signed)

## 2015-01-24 NOTE — Telephone Encounter (Signed)
Echo scheduled for this afternoon.  Pt to arrive at [place] at 2;45 pm today.  Notified pt's daughter,  Hayes Ludwig.  She verbalized understanding.

## 2015-01-24 NOTE — Assessment & Plan Note (Signed)
This is due to splenomegaly and relapsed disease. I recommend he hold aspirin.

## 2015-01-24 NOTE — Progress Notes (Signed)
Planned admission for inpatient chemo on 6/27 for R-Epoch.   Notified [place] in managed care dept,  Elmyra Ricks in Patient Placement,  Maudie Mercury on 3W and [place] in inpatient pharmacy.

## 2015-01-24 NOTE — Assessment & Plan Note (Signed)
This is likely anemia of chronic disease and underlying disease. The patient denies recent history of bleeding such as epistaxis, hematuria or hematochezia. He is asymptomatic from the anemia. We will observe for now.  He does not require transfusion now.

## 2015-01-24 NOTE — Progress Notes (Signed)
  Echocardiogram 2D Echocardiogram has been performed.  Jennette Dubin 01/24/2015, 4:10 PM

## 2015-01-24 NOTE — Telephone Encounter (Signed)
GAVE AND PRINTED APPT SCHED AND AVS FOR PT FOR July.....LINDA DAVIS WORKING ON PRECERT FOR ECHO/CT WILL CALL PT WITH APPT

## 2015-01-25 ENCOUNTER — Encounter (HOSPITAL_COMMUNITY): Payer: Self-pay

## 2015-01-25 ENCOUNTER — Ambulatory Visit (HOSPITAL_COMMUNITY)
Admission: RE | Admit: 2015-01-25 | Discharge: 2015-01-25 | Disposition: A | Discharge status: Home / Self Care | Payer: Medicare PPO | Source: Ambulatory Visit | Attending: Hematology and Oncology | Admitting: Hematology and Oncology

## 2015-01-25 DIAGNOSIS — C8310 Mantle cell lymphoma, unspecified site: Secondary | ICD-10-CM | POA: Diagnosis present

## 2015-01-25 DIAGNOSIS — R599 Enlarged lymph nodes, unspecified: Secondary | ICD-10-CM | POA: Insufficient documentation

## 2015-01-25 DIAGNOSIS — R161 Splenomegaly, not elsewhere classified: Secondary | ICD-10-CM | POA: Diagnosis present

## 2015-01-25 DIAGNOSIS — F4321 Adjustment disorder with depressed mood: Secondary | ICD-10-CM | POA: Diagnosis present

## 2015-01-25 DIAGNOSIS — Z6826 Body mass index (BMI) 26.0-26.9, adult: Secondary | ICD-10-CM

## 2015-01-25 DIAGNOSIS — E785 Hyperlipidemia, unspecified: Secondary | ICD-10-CM | POA: Diagnosis present

## 2015-01-25 DIAGNOSIS — E441 Mild protein-calorie malnutrition: Secondary | ICD-10-CM | POA: Diagnosis present

## 2015-01-25 DIAGNOSIS — C8318 Mantle cell lymphoma, lymph nodes of multiple sites: Secondary | ICD-10-CM | POA: Diagnosis present

## 2015-01-25 DIAGNOSIS — Z807 Family history of other malignant neoplasms of lymphoid, hematopoietic and related tissues: Secondary | ICD-10-CM

## 2015-01-25 DIAGNOSIS — D630 Anemia in neoplastic disease: Secondary | ICD-10-CM | POA: Diagnosis present

## 2015-01-25 DIAGNOSIS — Z801 Family history of malignant neoplasm of trachea, bronchus and lung: Secondary | ICD-10-CM

## 2015-01-25 DIAGNOSIS — F329 Major depressive disorder, single episode, unspecified: Secondary | ICD-10-CM | POA: Diagnosis present

## 2015-01-25 DIAGNOSIS — E039 Hypothyroidism, unspecified: Secondary | ICD-10-CM | POA: Diagnosis present

## 2015-01-25 DIAGNOSIS — Z5111 Encounter for antineoplastic chemotherapy: Principal | ICD-10-CM

## 2015-01-25 DIAGNOSIS — T451X5A Adverse effect of antineoplastic and immunosuppressive drugs, initial encounter: Secondary | ICD-10-CM | POA: Diagnosis not present

## 2015-01-25 DIAGNOSIS — Z800 Family history of malignant neoplasm of digestive organs: Secondary | ICD-10-CM

## 2015-01-25 DIAGNOSIS — I10 Essential (primary) hypertension: Secondary | ICD-10-CM | POA: Diagnosis present

## 2015-01-25 DIAGNOSIS — M109 Gout, unspecified: Secondary | ICD-10-CM | POA: Diagnosis present

## 2015-01-25 DIAGNOSIS — Z87891 Personal history of nicotine dependence: Secondary | ICD-10-CM

## 2015-01-25 DIAGNOSIS — D696 Thrombocytopenia, unspecified: Secondary | ICD-10-CM | POA: Diagnosis present

## 2015-01-25 DIAGNOSIS — R110 Nausea: Secondary | ICD-10-CM | POA: Diagnosis not present

## 2015-01-25 MED ORDER — IOHEXOL 300 MG/ML  SOLN
100.0000 mL | Freq: Once | INTRAMUSCULAR | Status: AC | PRN
  Administered 2015-01-25: 100 mL via INTRAVENOUS

## 2015-01-28 ENCOUNTER — Other Ambulatory Visit: Payer: Self-pay | Admitting: Hematology and Oncology

## 2015-01-28 ENCOUNTER — Inpatient Hospital Stay (HOSPITAL_COMMUNITY)
Admission: AD | Admit: 2015-01-28 | Discharge: 2015-02-01 | DRG: 847 | Disposition: A | Discharge status: Home / Self Care | Payer: Medicare PPO | Source: Ambulatory Visit | Attending: Hematology and Oncology | Admitting: Hematology and Oncology

## 2015-01-28 ENCOUNTER — Encounter (HOSPITAL_COMMUNITY): Payer: Self-pay | Admitting: *Deleted

## 2015-01-28 DIAGNOSIS — I10 Essential (primary) hypertension: Secondary | ICD-10-CM | POA: Diagnosis present

## 2015-01-28 DIAGNOSIS — Z87891 Personal history of nicotine dependence: Secondary | ICD-10-CM | POA: Diagnosis not present

## 2015-01-28 DIAGNOSIS — F329 Major depressive disorder, single episode, unspecified: Secondary | ICD-10-CM | POA: Diagnosis present

## 2015-01-28 DIAGNOSIS — D696 Thrombocytopenia, unspecified: Secondary | ICD-10-CM

## 2015-01-28 DIAGNOSIS — C8318 Mantle cell lymphoma, lymph nodes of multiple sites: Secondary | ICD-10-CM | POA: Diagnosis present

## 2015-01-28 DIAGNOSIS — E785 Hyperlipidemia, unspecified: Secondary | ICD-10-CM | POA: Diagnosis present

## 2015-01-28 DIAGNOSIS — D630 Anemia in neoplastic disease: Secondary | ICD-10-CM | POA: Diagnosis present

## 2015-01-28 DIAGNOSIS — T451X5A Adverse effect of antineoplastic and immunosuppressive drugs, initial encounter: Secondary | ICD-10-CM | POA: Diagnosis not present

## 2015-01-28 DIAGNOSIS — Z807 Family history of other malignant neoplasms of lymphoid, hematopoietic and related tissues: Secondary | ICD-10-CM | POA: Diagnosis not present

## 2015-01-28 DIAGNOSIS — C8310 Mantle cell lymphoma, unspecified site: Secondary | ICD-10-CM | POA: Diagnosis present

## 2015-01-28 DIAGNOSIS — R161 Splenomegaly, not elsewhere classified: Secondary | ICD-10-CM

## 2015-01-28 DIAGNOSIS — E039 Hypothyroidism, unspecified: Secondary | ICD-10-CM | POA: Diagnosis present

## 2015-01-28 DIAGNOSIS — E441 Mild protein-calorie malnutrition: Secondary | ICD-10-CM | POA: Diagnosis present

## 2015-01-28 DIAGNOSIS — F4321 Adjustment disorder with depressed mood: Secondary | ICD-10-CM

## 2015-01-28 DIAGNOSIS — M109 Gout, unspecified: Secondary | ICD-10-CM | POA: Diagnosis present

## 2015-01-28 DIAGNOSIS — Z800 Family history of malignant neoplasm of digestive organs: Secondary | ICD-10-CM | POA: Diagnosis not present

## 2015-01-28 DIAGNOSIS — R110 Nausea: Secondary | ICD-10-CM | POA: Diagnosis not present

## 2015-01-28 DIAGNOSIS — Z5111 Encounter for antineoplastic chemotherapy: Secondary | ICD-10-CM | POA: Diagnosis present

## 2015-01-28 DIAGNOSIS — Z801 Family history of malignant neoplasm of trachea, bronchus and lung: Secondary | ICD-10-CM | POA: Diagnosis not present

## 2015-01-28 DIAGNOSIS — D6481 Anemia due to antineoplastic chemotherapy: Secondary | ICD-10-CM | POA: Diagnosis present

## 2015-01-28 DIAGNOSIS — F328 Other depressive episodes: Secondary | ICD-10-CM

## 2015-01-28 DIAGNOSIS — R109 Unspecified abdominal pain: Secondary | ICD-10-CM

## 2015-01-28 DIAGNOSIS — Z5112 Encounter for antineoplastic immunotherapy: Secondary | ICD-10-CM

## 2015-01-28 DIAGNOSIS — C8314 Mantle cell lymphoma, lymph nodes of axilla and upper limb: Secondary | ICD-10-CM | POA: Diagnosis not present

## 2015-01-28 DIAGNOSIS — Z6826 Body mass index (BMI) 26.0-26.9, adult: Secondary | ICD-10-CM | POA: Diagnosis not present

## 2015-01-28 MED ORDER — VINCRISTINE SULFATE CHEMO INJECTION 1 MG/ML
Freq: Once | INTRAVENOUS | Status: AC
  Administered 2015-01-28: 11:00:00 via INTRAVENOUS
  Filled 2015-01-28: qty 11

## 2015-01-28 MED ORDER — ACYCLOVIR 400 MG PO TABS
400.0000 mg | ORAL_TABLET | Freq: Every day | ORAL | Status: DC
  Administered 2015-01-29 – 2015-02-01 (×4): 400 mg via ORAL
  Filled 2015-01-28 (×5): qty 1

## 2015-01-28 MED ORDER — ALTEPLASE 2 MG IJ SOLR
2.0000 mg | Freq: Once | INTRAMUSCULAR | Status: AC | PRN
  Filled 2015-01-28: qty 2

## 2015-01-28 MED ORDER — SODIUM CHLORIDE 0.9 % IV SOLN
INTRAVENOUS | Status: DC
  Administered 2015-01-28: 10:00:00 via INTRAVENOUS

## 2015-01-28 MED ORDER — SODIUM CHLORIDE 0.9 % IV SOLN
8.0000 mg | Freq: Three times a day (TID) | INTRAVENOUS | Status: DC | PRN
  Administered 2015-01-31: 8 mg via INTRAVENOUS
  Filled 2015-01-28 (×4): qty 4

## 2015-01-28 MED ORDER — HEPARIN SOD (PORK) LOCK FLUSH 100 UNIT/ML IV SOLN: 500.0000 [IU] | Freq: Once | INTRAVENOUS | Status: AC | PRN

## 2015-01-28 MED ORDER — SODIUM CHLORIDE 0.9 % IV SOLN
INTRAVENOUS | Status: DC
  Administered 2015-01-28 – 2015-02-01 (×9): via INTRAVENOUS

## 2015-01-28 MED ORDER — OXYCODONE HCL 5 MG PO TABS: 5.0000 mg | ORAL_TABLET | ORAL | Status: DC | PRN

## 2015-01-28 MED ORDER — LIDOCAINE-PRILOCAINE 2.5-2.5 % EX CREA
TOPICAL_CREAM | Freq: Once | CUTANEOUS | Status: AC
  Administered 2015-01-28: 10:00:00 via TOPICAL
  Filled 2015-01-28: qty 5

## 2015-01-28 MED ORDER — HEPARIN SOD (PORK) LOCK FLUSH 100 UNIT/ML IV SOLN: 250.0000 [IU] | Freq: Once | INTRAVENOUS | Status: AC | PRN

## 2015-01-28 MED ORDER — SODIUM CHLORIDE 0.9 % IJ SOLN: 3.0000 mL | INTRAMUSCULAR | Status: DC | PRN

## 2015-01-28 MED ORDER — ALLOPURINOL 300 MG PO TABS
300.0000 mg | ORAL_TABLET | Freq: Every day | ORAL | Status: DC
  Administered 2015-01-28 – 2015-02-01 (×5): 300 mg via ORAL
  Filled 2015-01-28 (×5): qty 1

## 2015-01-28 MED ORDER — ONDANSETRON HCL 40 MG/20ML IJ SOLN
Freq: Once | INTRAMUSCULAR | Status: AC
  Administered 2015-01-28: 8 mg via INTRAVENOUS
  Filled 2015-01-28: qty 4

## 2015-01-28 MED ORDER — SODIUM CHLORIDE 0.9 % IV SOLN
8.0000 mg | Freq: Three times a day (TID) | INTRAVENOUS | Status: DC | PRN
  Filled 2015-01-28: qty 4

## 2015-01-28 MED ORDER — PROCHLORPERAZINE EDISYLATE 5 MG/ML IJ SOLN: 5.0000 mg | Freq: Four times a day (QID) | INTRAMUSCULAR | Status: DC | PRN

## 2015-01-28 MED ORDER — COLD PACK MISC ONCOLOGY
1.0000 | Freq: Once | Status: AC | PRN
  Filled 2015-01-28: qty 1

## 2015-01-28 MED ORDER — HOT PACK MISC ONCOLOGY
1.0000 | Freq: Once | Status: AC | PRN
  Filled 2015-01-28: qty 1

## 2015-01-28 MED ORDER — PROCHLORPERAZINE MALEATE 10 MG PO TABS: 5.0000 mg | ORAL_TABLET | Freq: Four times a day (QID) | ORAL | Status: DC | PRN

## 2015-01-28 MED ORDER — LEVOTHYROXINE SODIUM 75 MCG PO TABS
75.0000 ug | ORAL_TABLET | Freq: Every day | ORAL | Status: DC
  Administered 2015-01-29 – 2015-02-01 (×4): 75 ug via ORAL
  Filled 2015-01-28 (×6): qty 1

## 2015-01-28 MED ORDER — PROCHLORPERAZINE MALEATE 10 MG PO TABS
10.0000 mg | ORAL_TABLET | Freq: Four times a day (QID) | ORAL | Status: DC | PRN
  Administered 2015-01-31: 10 mg via ORAL
  Filled 2015-01-28: qty 1

## 2015-01-28 MED ORDER — ONDANSETRON 4 MG PO TBDP: 8.0000 mg | ORAL_TABLET | Freq: Three times a day (TID) | ORAL | Status: DC | PRN

## 2015-01-28 MED ORDER — ACETAMINOPHEN 500 MG PO TABS: 500.0000 mg | ORAL_TABLET | Freq: Four times a day (QID) | ORAL | Status: DC | PRN

## 2015-01-28 MED ORDER — SENNOSIDES-DOCUSATE SODIUM 8.6-50 MG PO TABS
1.0000 | ORAL_TABLET | Freq: Two times a day (BID) | ORAL | Status: DC
  Administered 2015-01-29 – 2015-02-01 (×5): 1 via ORAL
  Filled 2015-01-28 (×10): qty 1

## 2015-01-28 MED ORDER — ENOXAPARIN SODIUM 40 MG/0.4ML ~~LOC~~ SOLN
40.0000 mg | SUBCUTANEOUS | Status: DC
  Administered 2015-01-29 – 2015-02-01 (×4): 40 mg via SUBCUTANEOUS
  Filled 2015-01-28 (×5): qty 0.4

## 2015-01-28 MED ORDER — ONDANSETRON HCL 8 MG PO TABS
8.0000 mg | ORAL_TABLET | Freq: Three times a day (TID) | ORAL | Status: DC | PRN
Start: 2015-01-28 — End: 2015-02-01

## 2015-01-28 MED ORDER — RASBURICASE 1.5 MG IV SOLR
6.0000 mg | Freq: Once | INTRAVENOUS | Status: AC
  Administered 2015-01-28: 6 mg via INTRAVENOUS
  Filled 2015-01-28: qty 4

## 2015-01-28 MED ORDER — ACETAMINOPHEN 325 MG PO TABS: 650.0000 mg | ORAL_TABLET | ORAL | Status: DC | PRN

## 2015-01-28 MED ORDER — SODIUM CHLORIDE 0.9 % IJ SOLN: 10.0000 mL | INTRAMUSCULAR | Status: DC | PRN

## 2015-01-28 MED ORDER — MIRTAZAPINE 30 MG PO TABS
30.0000 mg | ORAL_TABLET | Freq: Every day | ORAL | Status: DC
  Administered 2015-01-29 – 2015-01-31 (×3): 30 mg via ORAL
  Filled 2015-01-28 (×5): qty 1

## 2015-01-28 NOTE — Progress Notes (Signed)
Initial Nutrition Assessment  DOCUMENTATION CODES:  Non-severe (moderate) malnutrition in context of chronic illness  INTERVENTION:  Provide [employer] Instant Breakfast BID -each provides 220 kcal and 13g of protein) Encourage PO intake RD to continue to monitor  NUTRITION DIAGNOSIS:  Malnutrition related to chronic illness as evidenced by energy intake < or equal to 75% for > or equal to 1 month, mild depletion of body fat, moderate depletions of muscle mass.  GOAL:  Patient will meet greater than or equal to 90% of their needs  MONITOR:  PO intake, Supplement acceptance, Labs, Weight trends, Skin, I & O's  REASON FOR ASSESSMENT:  Malnutrition Screening Tool    ASSESSMENT: 74 y.o. male is here because of recent diagnosis of relapsed mantle cell lymphoma. Pt receiving chemotherapy treatments inpatient.  Pt reports he has had poor appetite and disinterest in food for months PTA. Wt has been stable ~200 lb. Pt states he has been eating things like [employer] frozen meals and [employer]. Pt likes [employer] Breakfast supplements. RD to order BID. Pt states he ordered a caesar salad for lunch but he is unsure if he will feel like eating it.  Nutrition-Focused physical exam completed. Findings are mild fat depletion, moderate muscle depletion, and no edema.   Labs reviewed.  Height:  Ht Readings from Last 1 Encounters:  01/28/15 6\' 3"  (1.905 m)    Weight:  Wt Readings from Last 1 Encounters:  01/28/15 206 lb (93.441 kg)    Ideal Body Weight:  89.1 kg  Wt Readings from Last 10 Encounters:  01/28/15 206 lb (93.441 kg)  01/24/15 204 lb 14.4 oz (92.942 kg)  10/25/14 210 lb 3.2 oz (95.346 kg)  09/25/14 206 lb (93.441 kg)  09/25/14 206 lb (93.441 kg)  08/28/14 200 lb 14.4 oz (91.128 kg)  08/17/14 205 lb 11.2 oz (93.305 kg)  08/13/14 199 lb 6.4 oz (90.447 kg)  08/07/14 201 lb 1.6 oz (91.218 kg)  06/07/14 200 lb 6.4 oz (90.901 kg)    BMI:  Body mass  index is 25.75 kg/(m^2).  Estimated Nutritional Needs:  Kcal:  3748-2707  Protein:  130-140g  Fluid:  2.3L/day    Skin:  Reviewed, no issues  Diet Order:  Diet regular Room service appropriate?: Yes; Fluid consistency:: Thin  EDUCATION NEEDS:  No education needs identified at this time  No intake or output data in the 24 hours ending 01/28/15 1130  Last BM:  6/26  Clayton Bibles, MS, RD, LDN Pager: [phone] After Hours Pager: [phone]

## 2015-01-28 NOTE — H&P (Signed)
[place] ADMISSION NOTE  Patient Care Team: Pcp Not In System as PCP - [place], MD as Referring Physician (Hematology and Oncology)  CHIEF COMPLAINTS/PURPOSE OF ADMISSION:  Relapsed/recurrent mantle cell lymphoma, admitted for cycle 1 of [place]  HISTORY OF PRESENTING ILLNESS:  Manuel [name] 74 y.o. male is here because of recent diagnosis of relapsed mantle cell lymphoma. Please see my dictation from last week for further details. Oncology History   Mantle cell lymphoma   Primary site: Lymphoid Neoplasms (Bilateral)   Staging method: AJCC 6th Edition   Clinical: Stage IV signed by Heath Lark, MD on 09/26/2013 10:53 AM   Pathologic: Stage IV signed by Heath Lark, MD on 09/26/2013 10:53 AM   Summary: Stage IV       Mantle cell lymphoma   09/12/2013 Procedure Patient have right axillary lymph node biopsy that confirmed B-cell, non-Hodgkin's lymphoma, suspect mantle cell lymphoma   09/12/2013 Procedure Patient had placement of Infuse-a-Port   09/16/2013 Imaging PET/CT scan showed multifocal hypermetabolic nodal activity involving the neck, chest, abdomen and pelvis as described. In addition, there is moderate splenomegaly with associated hypermetabolic activity consistent with lymphomatous involvement   09/21/2013 - 01/26/2014 Chemotherapy The patient received 6 cycles of bendamustine and rituximab   01/24/2014 Imaging Repeat PET/CT scan showed near complete response to treatment.   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   07/13/2014 Imaging  repeat PET CT scan show complete remission   08/10/2014 Imaging  repeat CT scan of the neck, chest and abdomen show disease relapse   08/16/2014 - 08/22/2014 Hospital Admission He was admitted to the hospital because of respiratory failure, signs of sepsis and severe pancytopenia   08/17/2014 - 01/24/2015 Chemotherapy He was started on Ibrutinib and  high dose prednisone, treatment stopped due to recurrent disease   10/24/2014 Imaging Repeat PET scan show marked reduction in splenomegaly and resolving hypermetabolic activity   1/82/9937 Imaging ECHO showed normal EF   01/25/2015 Imaging CT scan of chest, abdomen and pelvis showed recurrent splenomegaly and lymphadenopathy    Over the weekend, he feels slightly better while on prednisone. He continues to have persistent left upper quadrant pain. Family member noted is slightly depressed over the new dose of recurrence of lymphoma. Denies nausea or vomiting.  MEDICAL HISTORY:  Past Medical History  Diagnosis Date  . Thyroid disease   . Hyperlipemia   . Liver disease   . Malnutrition 08/30/2013  . Hypothyroidism   . Depression     Spouse passed 5 months ago  . Gout 09/28/2013  . Fatigue 08/31/2014  . Essential hypertension 10/25/2014  . 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 Back surgery      1985 and 1986  . Axillary lymph node biopsy Right 09/12/2013    Procedure: RIGHT AXILLARY LYMPH NODE BIOPSY;  Surgeon: Marcello Moores A. Cornett, MD;  Location: [place];  Service: General;  Laterality: Right;  . Portacath placement Right 09/12/2013    Procedure: INSERTION PORT-A-CATH;  Surgeon: Joyice Faster. Cornett, MD;  Location: [place];  Service: General;  Laterality: Right;    SOCIAL HISTORY: History   Social History  . Marital Status: Widowed    Spouse Name: N/A  . Number of Children: N/A  . Years of Education: N/A   Occupational History  . Not on file.  Social History Main Topics  . Smoking status: Former Smoker -- 0.25 packs/day for 52 years    Types: Cigarettes  . Smokeless tobacco: Never Used  . Alcohol Use: No  . Drug Use: No  . Sexual Activity: Not on file   Other Topics Concern  . Not on file   Social History Narrative    FAMILY  HISTORY: Family History  Problem Relation Age of Onset  . Cancer Mother     breast ca  . Cancer Sister     NHL  . Cancer Brother     ?colon can  . Cancer Brother     lung ca    ALLERGIES:  has No Known Allergies.  MEDICATIONS:  Current Facility-Administered Medications  Medication Dose Route Frequency Provider Last Rate Last Dose  . 0.9 %  sodium chloride infusion   Intravenous Continuous Heath Lark, MD 100 mL/hr at 01/28/15 0940    . 0.9 %  sodium chloride infusion   Intravenous Continuous Heath Lark, MD 10 mL/hr at 01/28/15 0940    . acetaminophen (TYLENOL) tablet 500 mg  500 mg Oral Q6H PRN Heath Lark, MD      . acetaminophen (TYLENOL) tablet 650 mg  650 mg Oral Q4H PRN Heath Lark, MD      . acyclovir (ZOVIRAX) tablet 400 mg  400 mg Oral Daily Heath Lark, MD      . allopurinol (ZYLOPRIM) tablet 300 mg  300 mg Oral Daily Shahara Hartsfield, MD      . alteplase (CATHFLO ACTIVASE) injection 2 mg  2 mg Intracatheter Once PRN Heath Lark, MD      . Cold Pack 1 packet  1 packet Topical Once PRN Heath Lark, MD      . DOXOrubicin (ADRIAMYCIN) 22 mg, etoposide (VEPESID) 112 mg, vinCRIStine (ONCOVIN) 0.9 mg in sodium chloride 0.9 % 550 mL chemo infusion   Intravenous Once Heath Lark, MD      . enoxaparin (LOVENOX) injection 40 mg  40 mg Subcutaneous Q24H Desaray Marschner, MD      . heparin lock flush 100 unit/mL  500 Units Intracatheter Once PRN Heath Lark, MD      . heparin lock flush 100 unit/mL  250 Units Intracatheter Once PRN Heath Lark, MD      . Hot Pack 1 packet  1 packet Topical Once PRN Heath Lark, MD      . levothyroxine (SYNTHROID, LEVOTHROID) tablet 75 mcg  75 mcg Oral QAC breakfast Heath Lark, MD      . ondansetron (ZOFRAN) tablet 8 mg  8 mg Oral Q8H PRN Heath Lark, MD       Or  . ondansetron (ZOFRAN-ODT) disintegrating tablet 8 mg  8 mg Oral Q8H PRN Heath Lark, MD       Or  . ondansetron (ZOFRAN) 8 mg in sodium chloride 0.9 % 50 mL IVPB  8 mg Intravenous Q8H PRN Shirley Bolle, MD       Or   . ondansetron (ZOFRAN) 8 mg in sodium chloride 0.9 % 50 mL IVPB  8 mg Intravenous Q8H PRN Luara Faye, MD      . ondansetron (ZOFRAN) 8 mg, dexamethasone (DECADRON) 10 mg in sodium chloride 0.9 % 50 mL IVPB   Intravenous Once Heath Lark, MD      . oxyCODONE (Oxy IR/ROXICODONE) immediate release tablet 5 mg  5 mg Oral Q4H PRN Sharde Gover, MD      . prochlorperazine (COMPAZINE) tablet 5 mg  5 mg Oral  Q6H PRN Heath Lark, MD       Or  . prochlorperazine (COMPAZINE) injection 5 mg  5 mg Intravenous Q6H PRN Heath Lark, MD      . prochlorperazine (COMPAZINE) tablet 10 mg  10 mg Oral Q6H PRN Heath Lark, MD      . rasburicase (ELITEK) 6 mg in sodium chloride 0.9 % 46 mL IVPB  6 mg Intravenous Once Heath Lark, MD   6 mg at 01/28/15 0940  . senna-docusate (Senokot-S) tablet 1 tablet  1 tablet Oral BID Heath Lark, MD      . sodium chloride 0.9 % injection 10 mL  10 mL Intracatheter PRN Heath Lark, MD      . sodium chloride 0.9 % injection 3 mL  3 mL Intravenous PRN Heath Lark, MD        REVIEW OF SYSTEMS:   Constitutional: Denies fevers, chills  Eyes: Denies blurriness of vision, double vision or watery eyes Ears, nose, mouth, throat, and face: Denies mucositis or sore throat Respiratory: Denies cough, dyspnea or wheezes Cardiovascular: Denies palpitation, chest discomfort or lower extremity swelling Gastrointestinal:  Denies nausea, heartburn or change in bowel habits Skin: Denies abnormal skin rashes Lymphatics: Denies new lymphadenopathy or easy bruising Neurological:Denies numbness, tingling or new weaknesses Behavioral/Psych: Mood is stable, no new changes  All other systems were reviewed with the patient and are negative.  PHYSICAL EXAMINATION: ECOG PERFORMANCE STATUS: 2 - Symptomatic, <50% confined to bed  Filed Vitals:   01/28/15 0829  BP: 144/64  Pulse: 81  Temp: 97.5 F (36.4 C)  Resp: 20   Filed Weights   01/28/15 0829  Weight: 206 lb (93.441 kg)    GENERAL:alert, no distress  and comfortable SKIN: skin color, texture, turgor are normal, no rashes or significant lesions EYES: normal, conjunctiva are pink and non-injected, sclera clear OROPHARYNX:no exudate, no erythema and lips, buccal mucosa, and tongue normal  NECK: supple, thyroid normal size, non-tender, without nodularity LYMPH:  no palpable lymphadenopathy in the cervical, axillary or inguinal LUNGS: clear to auscultation and percussion with normal breathing effort HEART: regular rate & rhythm and no murmurs and no lower extremity edema ABDOMEN:abdomen soft, non-tender and normal bowel sounds Musculoskeletal:no cyanosis of digits and no clubbing  PSYCH: alert & oriented x 3 with fluent speech NEURO: no focal motor/sensory deficits  LABORATORY DATA:  I have reviewed the data as listed Lab Results  Component Value Date   WBC 10.0 01/24/2015   HGB 9.8* 01/24/2015   HCT 29.0* 01/24/2015   MCV 92.4 01/24/2015   PLT 53* 01/24/2015    Recent Labs  08/20/14 0738 08/21/14 0455 08/22/14 0406  09/25/14 1432 10/25/14 1236 01/24/15 0854  NA 142 144 140  < > 147* 140 142  K 4.0 3.7 4.3  < > 4.3 4.2 4.1  CL 113* 113* 109  --   --   --   --   CO2 20 23 24   < > 26 21* 24  GLUCOSE 111* 102* 117*  < > 91 92 77  BUN 29* 33* 32*  < > 14.3 16.3 17.6  CREATININE 1.19 1.36* 1.24  < > 1.1 1.1 1.3  CALCIUM 8.8 8.8 8.7  < > 9.4 9.4 8.5  GFRNONAA 59* 50* 56*  --   --   --   --   GFRAA 69* 58* 65*  --   --   --   --   PROT  --  5.5* 5.6*  < > 6.5  6.8 5.8*  ALBUMIN  --  3.1* 3.3*  < > 3.7 3.9 3.6  AST  --  18 24  < > 19 27 20   ALT  --  20 40  < > 19 16 14   ALKPHOS  --  67 70  < > 80 75 96  BILITOT  --  0.9 0.7  < > 0.80 0.72 0.73  < > = values in this interval not displayed.  RADIOGRAPHIC STUDIES: I reviewed the scan with the patient. I have personally reviewed the radiological images as listed and agreed with the findings in the report. Ct Chest W Contrast  01/25/2015   CLINICAL DATA:  Mantle cell lymphoma   EXAM: CT CHEST, ABDOMEN, AND PELVIS WITH CONTRAST  TECHNIQUE: Multidetector CT imaging of the chest, abdomen and pelvis was performed following the standard protocol during bolus administration of intravenous contrast.  CONTRAST:  146mL OMNIPAQUE IOHEXOL 300 MG/ML  SOLN  COMPARISON:  PET-CT 10/24/2014 and prior CT scan 08/10/2014  FINDINGS: CT CHEST FINDINGS  Chest wall: No chest wall mass, supraclavicular or axillary adenopathy. A few small scattered lymph nodes are noted. A right-sided Port-A-Cath is in place. The bony thorax is intact. No destructive bone lesions or spinal canal compromise.  Mediastinum: The heart is normal in size. No pericardial effusion. Stable aortic and coronary artery calcifications. The esophagus is grossly normal. There are small scattered mediastinal and hilar lymph nodes but no mass or adenopathy.  Lungs/ pleura: Small scattered subpleural nodules are likely lymph nodes. Most of these appear slightly larger. No infiltrates, edema or effusions.  CT ABDOMEN AND PELVIS FINDINGS  Hepatobiliary: No focal hepatic lesions or intrahepatic biliary dilatation. Gallbladder is normal. No common bile duct dilatation.  Pancreas: No mass, inflammation or ductal dilatation. Stable mild pancreatic atrophy and slightly prominent main pancreatic duct.  Spleen: Persistent splenomegaly. The spleen measures 18.2 x 18.1 x 10.8 Cm. Splenic volume is 1778 cubic cm. This represents a significant increase in size since the PET-CT in March. Small peripheral splenic infarcts are noted.  Adrenals/Urinary Tract: The adrenal glands and kidneys are unremarkable and stable.  Stomach/Bowel: The stomach, duodenum, small bowel and colon are unremarkable. No inflammatory changes, mass lesions or obstructive findings. Moderate to advanced diverticulosis involving the sigmoid colon but no findings for acute diverticulitis. The terminal ileum is normal. The appendix is normal.  Vascular/Lymphatic: Periportal and celiac axis  adenopathy. Index node on image number 62 measures 20 mm. This measured 18 mm on the CT scan from 08/10/2014 and 12 mm on the previous PET-CT. Hepatic duodenum ligament lymph node on image number 66 measures 17 mm in was 7 mm on the prior PET-CT. Numerous other lymph nodes have enlarged since the prior PET-CT. Small scattered retroperitoneal lymph nodes. The aorta demonstrates stable atherosclerotic disease. The major venous structures are patent. Very prominent splenic vein.  Other: The bladder, prostate gland and seminal vesicles are unremarkable. No pelvic mass or adenopathy. No free pelvic fluid collections. No inguinal mass or adenopathy.  Musculoskeletal: No significant bony findings. Stable advanced degenerative changes involving the spine and bilateral pars defects at L5.  IMPRESSION: Recurrent splenomegaly and upper abdominal lymphadenopathy suggesting recurrent lymphoma. This study looks very similar to the CT scan from 08/10/2014.   Electronically Signed   By: Marijo Sanes M.D.   On: 01/25/2015 12:44   Ct Abdomen Pelvis W Contrast  01/25/2015   CLINICAL DATA:  Mantle cell lymphoma  EXAM: CT CHEST, ABDOMEN, AND PELVIS WITH CONTRAST  TECHNIQUE: Multidetector CT imaging of the chest, abdomen and pelvis was performed following the standard protocol during bolus administration of intravenous contrast.  CONTRAST:  127mL OMNIPAQUE IOHEXOL 300 MG/ML  SOLN  COMPARISON:  PET-CT 10/24/2014 and prior CT scan 08/10/2014  FINDINGS: CT CHEST FINDINGS  Chest wall: No chest wall mass, supraclavicular or axillary adenopathy. A few small scattered lymph nodes are noted. A right-sided Port-A-Cath is in place. The bony thorax is intact. No destructive bone lesions or spinal canal compromise.  Mediastinum: The heart is normal in size. No pericardial effusion. Stable aortic and coronary artery calcifications. The esophagus is grossly normal. There are small scattered mediastinal and hilar lymph nodes but no mass or  adenopathy.  Lungs/ pleura: Small scattered subpleural nodules are likely lymph nodes. Most of these appear slightly larger. No infiltrates, edema or effusions.  CT ABDOMEN AND PELVIS FINDINGS  Hepatobiliary: No focal hepatic lesions or intrahepatic biliary dilatation. Gallbladder is normal. No common bile duct dilatation.  Pancreas: No mass, inflammation or ductal dilatation. Stable mild pancreatic atrophy and slightly prominent main pancreatic duct.  Spleen: Persistent splenomegaly. The spleen measures 18.2 x 18.1 x 10.8 Cm. Splenic volume is 1778 cubic cm. This represents a significant increase in size since the PET-CT in March. Small peripheral splenic infarcts are noted.  Adrenals/Urinary Tract: The adrenal glands and kidneys are unremarkable and stable.  Stomach/Bowel: The stomach, duodenum, small bowel and colon are unremarkable. No inflammatory changes, mass lesions or obstructive findings. Moderate to advanced diverticulosis involving the sigmoid colon but no findings for acute diverticulitis. The terminal ileum is normal. The appendix is normal.  Vascular/Lymphatic: Periportal and celiac axis adenopathy. Index node on image number 62 measures 20 mm. This measured 18 mm on the CT scan from 08/10/2014 and 12 mm on the previous PET-CT. Hepatic duodenum ligament lymph node on image number 66 measures 17 mm in was 7 mm on the prior PET-CT. Numerous other lymph nodes have enlarged since the prior PET-CT. Small scattered retroperitoneal lymph nodes. The aorta demonstrates stable atherosclerotic disease. The major venous structures are patent. Very prominent splenic vein.  Other: The bladder, prostate gland and seminal vesicles are unremarkable. No pelvic mass or adenopathy. No free pelvic fluid collections. No inguinal mass or adenopathy.  Musculoskeletal: No significant bony findings. Stable advanced degenerative changes involving the spine and bilateral pars defects at L5.  IMPRESSION: Recurrent splenomegaly  and upper abdominal lymphadenopathy suggesting recurrent lymphoma. This study looks very similar to the CT scan from 08/10/2014.   Electronically Signed   By: Marijo Sanes M.D.   On: 01/25/2015 12:44    ASSESSMENT & PLAN:   Mantle cell lymphoma Unfortunately, with his symptoms of splenomegaly, profuse night sweats and significant pancytopenia, there is no doubt in my mind that he have relapsed/recurrent mantle cell lymphoma. CT scan of the chest, abdomen and pelvis from last week confirmed relapse. 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 He had minimal benefit with transplant and has a short course of benefit with Ibrutinib. Due to the urgency and aggressive course of his disease, he was started on prednisone 60 mg over the weekend.  He is admitted now on 01/28/2015 for R-EPOCH as salvage treatment. R-CHOP Chemotherapy would be inadequate and R-ICE chemotherapy would be too difficult for him to tolerate.  If we can get good response to treatment, I will consult [facility] again for potential second opinion. Anti-BCL 2  treatment Venclexta is recently approved for CLL. I might try to get him compassionate use of this.  We discussed the role of chemotherapy. The intent is for palliative.  We discussed some of the risks, benefits, side-effects of Rituximab, Cytoxan, Adriamycin, Prednisone, Etoposide and vincristine. Due to high risk disease burden, I plan to start rituximab on day 2 instead of day 1 and cap maximum infusion rate at 100 ml/hour.  Some of the short term side-effects included, though not limited to, risk of fatigue, risk of allergic reactions, mouth sores, weight loss, pancytopenia, life-threatening infections, need for transfusions of blood products, nausea, vomiting, change in bowel habits, blood clots, admission to hospital for various reasons, and risks of death.   Long term  side-effects are also discussed including risks of infertility, permanent damage to nerve function, chronic fatigue, and rare secondary malignancy including bone marrow disorders and leukemia.   The patient is aware that the response rates discussed earlier is not guaranteed. After a long discussion, patient made an informed decision to proceed with the prescribed plan of care.  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 The patient denies recent history of bleeding such as epistaxis, hematuria or hematochezia. He is asymptomatic from the anemia. We will observe for now. He does not require transfusion now.   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 I recommend he hold aspirin.  High risk tumor lysis syndrome Start rasburicase, IVF and allopurinol. Monitor labs carefully and move Rituxan to day 2  DVT prophylaxis Hold if platelet is less than 50,000  Code status Full code  Situational depression Start Remeron. Will discuss with him about palliative care consult over the next few days  Discharge planning Discharge Friday once his chemotherapy is completed   All questions were answered. The patient knows to call the clinic with any problems, questions or concerns.    [facility], [place], MD 01/28/2015 9:47 AM

## 2015-01-29 DIAGNOSIS — R110 Nausea: Secondary | ICD-10-CM

## 2015-01-29 LAB — COMPREHENSIVE METABOLIC PANEL
ALBUMIN: 3.1 g/dL — AB (ref 3.5–5.0)
ALT: 14 U/L — ABNORMAL LOW (ref 17–63)
ANION GAP: 8 (ref 5–15)
AST: 16 U/L (ref 15–41)
Alkaline Phosphatase: 61 U/L (ref 38–126)
BUN: 23 mg/dL — AB (ref 6–20)
CO2: 20 mmol/L — ABNORMAL LOW (ref 22–32)
Calcium: 7.9 mg/dL — ABNORMAL LOW (ref 8.9–10.3)
Chloride: 114 mmol/L — ABNORMAL HIGH (ref 101–111)
Creatinine, Ser: 1.34 mg/dL — ABNORMAL HIGH (ref 0.61–1.24)
GFR calc Af Amer: 59 mL/min — ABNORMAL LOW (ref >60)
GFR calc non Af Amer: 51 mL/min — ABNORMAL LOW (ref >60)
Glucose, Bld: 119 mg/dL — ABNORMAL HIGH (ref 65–99)
POTASSIUM: 3.8 mmol/L (ref 3.5–5.1)
Sodium: 142 mmol/L (ref 135–145)
Total Bilirubin: 0.5 mg/dL (ref 0.3–1.2)
Total Protein: 5.1 g/dL — ABNORMAL LOW (ref 6.5–8.1)

## 2015-01-29 LAB — CBC WITH DIFFERENTIAL/PLATELET
BAND NEUTROPHILS: 0 % (ref 0–10)
BASOS ABS: 0 10*3/uL (ref 0.0–0.1)
BLASTS: 0 %
Basophils Relative: 0 % (ref 0–1)
Eosinophils Absolute: 0 10*3/uL (ref 0.0–0.7)
Eosinophils Relative: 0 % (ref 0–5)
HEMATOCRIT: 25.6 % — AB (ref 39.0–52.0)
Hemoglobin: 8.3 g/dL — ABNORMAL LOW (ref 13.0–17.0)
Lymphocytes Relative: 33 % (ref 12–46)
Lymphs Abs: 2.8 10*3/uL (ref 0.7–4.0)
MCH: 30.6 pg (ref 26.0–34.0)
MCHC: 32.4 g/dL (ref 30.0–36.0)
MCV: 94.5 fL (ref 78.0–100.0)
MONOS PCT: 23 % — AB (ref 3–12)
Metamyelocytes Relative: 0 %
Monocytes Absolute: 2 10*3/uL — ABNORMAL HIGH (ref 0.1–1.0)
Myelocytes: 0 %
Neutro Abs: 3 10*3/uL (ref 1.7–7.7)
Neutrophils Relative %: 35 % — ABNORMAL LOW (ref 43–77)
Other: 9 %
PLATELETS: 63 10*3/uL — AB (ref 150–400)
PROMYELOCYTES ABS: 0 %
RBC: 2.71 MIL/uL — ABNORMAL LOW (ref 4.22–5.81)
RDW: 16.1 % — AB (ref 11.5–15.5)
WBC: 8.5 10*3/uL (ref 4.0–10.5)
nRBC: 1 /100 WBC — ABNORMAL HIGH

## 2015-01-29 LAB — LACTATE DEHYDROGENASE: LDH: 302 U/L — AB (ref 98–192)

## 2015-01-29 MED ORDER — FAMOTIDINE IN NACL 20-0.9 MG/50ML-% IV SOLN
20.0000 mg | Freq: Once | INTRAVENOUS | Status: AC | PRN
  Filled 2015-01-29: qty 50

## 2015-01-29 MED ORDER — EPINEPHRINE HCL 1 MG/ML IJ SOLN
0.5000 mg | Freq: Once | INTRAMUSCULAR | Status: AC | PRN
  Filled 2015-01-29: qty 1

## 2015-01-29 MED ORDER — ACETAMINOPHEN 325 MG PO TABS
650.0000 mg | ORAL_TABLET | Freq: Once | ORAL | Status: AC
  Administered 2015-01-29: 650 mg via ORAL
  Filled 2015-01-29: qty 2

## 2015-01-29 MED ORDER — METHYLPREDNISOLONE SODIUM SUCC 125 MG IJ SOLR
125.0000 mg | Freq: Once | INTRAMUSCULAR | Status: AC | PRN
  Filled 2015-01-29: qty 2

## 2015-01-29 MED ORDER — SODIUM CHLORIDE 0.9 % IJ SOLN: 10.0000 mL | INTRAMUSCULAR | Status: DC | PRN

## 2015-01-29 MED ORDER — EPINEPHRINE HCL 0.1 MG/ML IJ SOSY
0.2500 mg | PREFILLED_SYRINGE | Freq: Once | INTRAMUSCULAR | Status: AC | PRN
  Filled 2015-01-29: qty 10

## 2015-01-29 MED ORDER — ALTEPLASE 2 MG IJ SOLR
2.0000 mg | Freq: Once | INTRAMUSCULAR | Status: AC | PRN
  Filled 2015-01-29: qty 2

## 2015-01-29 MED ORDER — ALBUTEROL SULFATE (2.5 MG/3ML) 0.083% IN NEBU: 2.5000 mg | INHALATION_SOLUTION | Freq: Once | RESPIRATORY_TRACT | Status: AC | PRN

## 2015-01-29 MED ORDER — ONDANSETRON HCL 40 MG/20ML IJ SOLN
Freq: Once | INTRAMUSCULAR | Status: AC
  Administered 2015-01-29: 8 mg via INTRAVENOUS
  Filled 2015-01-29: qty 4

## 2015-01-29 MED ORDER — DIPHENHYDRAMINE HCL 50 MG/ML IJ SOLN: 25.0000 mg | Freq: Once | INTRAMUSCULAR | Status: AC | PRN

## 2015-01-29 MED ORDER — VINCRISTINE SULFATE CHEMO INJECTION 1 MG/ML
Freq: Once | INTRAVENOUS | Status: AC
  Administered 2015-01-29: 11:00:00 via INTRAVENOUS
  Filled 2015-01-29: qty 11

## 2015-01-29 MED ORDER — SODIUM CHLORIDE 0.9 % IV SOLN
375.0000 mg/m2 | Freq: Once | INTRAVENOUS | Status: AC
  Administered 2015-01-29: 800 mg via INTRAVENOUS
  Filled 2015-01-29: qty 80

## 2015-01-29 MED ORDER — SODIUM CHLORIDE 0.9 % IV SOLN: Freq: Once | INTRAVENOUS | Status: DC

## 2015-01-29 MED ORDER — SODIUM CHLORIDE 0.9 % IV SOLN: Freq: Once | INTRAVENOUS | Status: AC | PRN

## 2015-01-29 MED ORDER — HEPARIN SOD (PORK) LOCK FLUSH 100 UNIT/ML IV SOLN: 500.0000 [IU] | Freq: Once | INTRAVENOUS | Status: AC | PRN

## 2015-01-29 MED ORDER — DIPHENHYDRAMINE HCL 50 MG/ML IJ SOLN: 50.0000 mg | Freq: Once | INTRAMUSCULAR | Status: AC | PRN

## 2015-01-29 MED ORDER — DIPHENHYDRAMINE HCL 50 MG PO CAPS
50.0000 mg | ORAL_CAPSULE | Freq: Once | ORAL | Status: AC
  Administered 2015-01-29: 50 mg via ORAL
  Filled 2015-01-29: qty 1

## 2015-01-29 MED ORDER — SODIUM CHLORIDE 0.9 % IJ SOLN: 3.0000 mL | INTRAMUSCULAR | Status: DC | PRN

## 2015-01-29 MED ORDER — HEPARIN SOD (PORK) LOCK FLUSH 100 UNIT/ML IV SOLN: 250.0000 [IU] | Freq: Once | INTRAVENOUS | Status: AC | PRN

## 2015-01-29 NOTE — Progress Notes (Signed)
Manuel [name]   DOB:20-Nov-1940   NF#:[id number]   Oncology History   Mantle cell lymphoma   Primary site: Lymphoid Neoplasms (Bilateral)   Staging method: AJCC 6th Edition   Clinical: Stage IV signed by Heath Lark, MD on 09/26/2013 10:53 AM   Pathologic: Stage IV signed by Heath Lark, MD on 09/26/2013 10:53 AM   Summary: Stage IV       Mantle cell lymphoma   09/12/2013 Procedure Patient have right axillary lymph node biopsy that confirmed B-cell, non-Hodgkin's lymphoma, suspect mantle cell lymphoma   09/12/2013 Procedure Patient had placement of Infuse-a-Port   09/16/2013 Imaging PET/CT scan showed multifocal hypermetabolic nodal activity involving the neck, chest, abdomen and pelvis as described. In addition, there is moderate splenomegaly with associated hypermetabolic activity consistent with lymphomatous involvement   09/21/2013 - 01/26/2014 Chemotherapy The patient received 6 cycles of bendamustine and rituximab   01/24/2014 Imaging Repeat PET/CT scan showed near complete response to treatment.   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   07/13/2014 Imaging  repeat PET CT scan show complete remission   08/10/2014 Imaging  repeat CT scan of the neck, chest and abdomen show disease relapse   08/16/2014 - 08/22/2014 Hospital Admission He was admitted to the hospital because of respiratory failure, signs of sepsis and severe pancytopenia   08/17/2014 - 01/24/2015 Chemotherapy He was started on Ibrutinib and high dose prednisone, treatment stopped due to recurrent disease   10/24/2014 Imaging Repeat PET scan show marked reduction in splenomegaly and resolving hypermetabolic activity   8/46/9629 Imaging ECHO showed normal EF   01/25/2015 Imaging CT scan of chest, abdomen and pelvis showed recurrent splenomegaly and lymphadenopathy   Today is cycle 1, day 2 of R-EPOCH Subjective: He complained of nausea today but no vomiting. He refused  Lovenox yesterday as well as Remeron. He claimed he is not depressed. He has trouble sleeping because of the machine making too much noise. His appetite is stable. Denies any constipation. No fevers or chills. His abdominal discomfort is about the same.  Objective:  Filed Vitals:   01/29/15 0656  BP: 113/78  Pulse: 86  Temp: 97.8 F (36.6 C)  Resp: 16     Intake/Output Summary (Last 24 hours) at 01/29/15 0858 Last data filed at 01/29/15 0813  Gross per 24 hour  Intake 2232.07 ml  Output      0 ml  Net 2232.07 ml    GENERAL:alert, no distress and comfortable SKIN: skin color, texture, turgor are normal, no rashes or significant lesions EYES: normal, Conjunctiva are pink and non-injected, sclera clear OROPHARYNX:no exudate, no erythema and lips, buccal mucosa, and tongue normal  NECK: supple, thyroid normal size, non-tender, without nodularity LYMPH:  no palpable lymphadenopathy in the cervical, axillary or inguinal LUNGS: clear to auscultation and percussion with normal breathing effort HEART: regular rate & rhythm and no murmurs and no lower extremity edema ABDOMEN:abdomen soft, non-tender and normal bowel sounds, Splenomegaly Musculoskeletal:no cyanosis of digits and no clubbing  NEURO: alert & oriented x 3 with fluent speech, no focal motor/sensory deficits   Labs:  Lab Results  Component Value Date   WBC 8.5 01/29/2015   HGB 8.3* 01/29/2015   HCT 25.6* 01/29/2015   MCV 94.5 01/29/2015   PLT 63* 01/29/2015   NEUTROABS 3.0 01/29/2015    Lab Results  Component Value Date   NA 142 01/29/2015   K 3.8 01/29/2015   CL 114* 01/29/2015   CO2 20*  01/29/2015   Assessment & Plan:   Recurrent mantle cell lymphoma He is admitted on 01/28/2015 for R-EPOCH as salvage treatment. He tolerated treatment well up off from some nausea but no vomiting. Today, we will plan on modified rituximab infusion and continue the rest of the chemotherapy the same under 22 hours  infusion.  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 The patient denies recent history of bleeding such as epistaxis, hematuria or hematochezia. He is asymptomatic from the anemia. We will observe for now. He does not require transfusion now unless hemoglobin is less than 8 g.  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 I recommend he hold aspirin. He can have DVT prophylaxis as well as blood count is greater than 50,000  High risk tumor lysis syndrome He received rasburicase on 01/28/2015.  He will continue IVF and allopurinol. Monitor labs carefully   DVT prophylaxis Hold if platelet is less than 50,000 I encouraged him to get Lovenox for DVT prophylaxis.  Code status Full code  Situational depression The patient claimed that he is not depressed If he declines Remeron tonight, I would discontinued at permanently  Chemotherapy-induced nausea without vomiting Continue anti-emetics. He will continue IV fluids.  Discharge planning Discharge Friday once his chemotherapy is completed   [facility], Kaiea Esselman, MD 01/29/2015  8:58 AM

## 2015-01-29 NOTE — Progress Notes (Signed)
BSA, dosages and dilutions verified for Adriamycin, Vepesid, Oncovin and Rituxan with 2nd RN [employer].

## 2015-01-29 NOTE — Progress Notes (Signed)
BSA, Dosages and dilutions verified for Adria, vepesid and oncovin with 2nd RN Nancy Marus.

## 2015-01-30 DIAGNOSIS — Z5111 Encounter for antineoplastic chemotherapy: Principal | ICD-10-CM

## 2015-01-30 DIAGNOSIS — C8314 Mantle cell lymphoma, lymph nodes of axilla and upper limb: Secondary | ICD-10-CM

## 2015-01-30 DIAGNOSIS — R112 Nausea with vomiting, unspecified: Secondary | ICD-10-CM

## 2015-01-30 LAB — CBC WITH DIFFERENTIAL/PLATELET
BASOS PCT: 2 % — AB (ref 0–1)
Basophils Absolute: 0.1 10*3/uL (ref 0.0–0.1)
EOS ABS: 0.1 10*3/uL (ref 0.0–0.7)
Eosinophils Relative: 2 % (ref 0–5)
HCT: 25.4 % — ABNORMAL LOW (ref 39.0–52.0)
Hemoglobin: 8.3 g/dL — ABNORMAL LOW (ref 13.0–17.0)
LYMPHS PCT: 18 % (ref 12–46)
Lymphs Abs: 0.8 10*3/uL (ref 0.7–4.0)
MCH: 30.7 pg (ref 26.0–34.0)
MCHC: 32.7 g/dL (ref 30.0–36.0)
MCV: 94.1 fL (ref 78.0–100.0)
MONO ABS: 2.2 10*3/uL — AB (ref 0.1–1.0)
Monocytes Relative: 51 % — ABNORMAL HIGH (ref 3–12)
NEUTROS ABS: 1.2 10*3/uL — AB (ref 1.7–7.7)
NEUTROS PCT: 27 % — AB (ref 43–77)
Platelets: 65 10*3/uL — ABNORMAL LOW (ref 150–400)
RBC: 2.7 MIL/uL — ABNORMAL LOW (ref 4.22–5.81)
RDW: 16.2 % — AB (ref 11.5–15.5)
WBC: 4.4 10*3/uL (ref 4.0–10.5)

## 2015-01-30 LAB — LACTATE DEHYDROGENASE: LDH: 244 U/L — AB (ref 98–192)

## 2015-01-30 LAB — COMPREHENSIVE METABOLIC PANEL
ALT: 16 U/L — AB (ref 17–63)
AST: 14 U/L — ABNORMAL LOW (ref 15–41)
Albumin: 3 g/dL — ABNORMAL LOW (ref 3.5–5.0)
Alkaline Phosphatase: 54 U/L (ref 38–126)
Anion gap: 7 (ref 5–15)
BUN: 27 mg/dL — ABNORMAL HIGH (ref 6–20)
CO2: 20 mmol/L — ABNORMAL LOW (ref 22–32)
CREATININE: 1.21 mg/dL (ref 0.61–1.24)
Calcium: 7.9 mg/dL — ABNORMAL LOW (ref 8.9–10.3)
Chloride: 116 mmol/L — ABNORMAL HIGH (ref 101–111)
GFR calc non Af Amer: 58 mL/min — ABNORMAL LOW (ref >60)
Glucose, Bld: 100 mg/dL — ABNORMAL HIGH (ref 65–99)
Potassium: 3.8 mmol/L (ref 3.5–5.1)
SODIUM: 143 mmol/L (ref 135–145)
TOTAL PROTEIN: 5 g/dL — AB (ref 6.5–8.1)
Total Bilirubin: 0.6 mg/dL (ref 0.3–1.2)

## 2015-01-30 LAB — URIC ACID: URIC ACID, SERUM: 2.7 mg/dL — AB (ref 4.4–7.6)

## 2015-01-30 MED ORDER — SODIUM CHLORIDE 0.9 % IV SOLN
Freq: Once | INTRAVENOUS | Status: AC
  Administered 2015-01-30: 8 mg via INTRAVENOUS
  Filled 2015-01-30: qty 4

## 2015-01-30 MED ORDER — VINCRISTINE SULFATE CHEMO INJECTION 1 MG/ML
Freq: Once | INTRAVENOUS | Status: AC
  Administered 2015-01-30: 09:00:00 via INTRAVENOUS
  Filled 2015-01-30: qty 11

## 2015-01-30 NOTE — Clinical Documentation Improvement (Signed)
Registered Dietician notes 01/28/2015 " Non-severe (moderate) malnutrition in context of chronic illness.  Malnutrition related to chronic illness as evidenced by energy intake < or equal to 75% for > or equal to 1 month, mild depletion of body fat, moderate depletions of muscle mass." Ht per RD 6 feet 3 inches; Weight 206 pounds, BMI 27.75.  Please document if you agree with the Registered Dietician's assessment of moderate malnutrition in your progress note and carry over to the discharge summary.    Possible Clinical Conditions: -Moderate malnutrition (as per RD's assessment) -Malnutrition, other severity (please specify) -Other condition -Unable to determine at present.    Thank you, Mateo Flow, RN [phone] Clinical Documentation Specialist

## 2015-01-30 NOTE — Progress Notes (Signed)
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 In addition, there is moderate splenomegaly with associated hypermetabolic activity consistent with lymphomatous involvement   09/21/2013 - 01/26/2014 Chemotherapy The patient received 6 cycles of bendamustine and rituximab   01/24/2014 Imaging Repeat PET/CT scan showed near complete response to treatment.   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   07/13/2014 Imaging  repeat PET CT scan show complete remission   08/10/2014 Imaging  repeat CT scan of the neck, chest and abdomen show disease relapse   08/16/2014 - 08/22/2014 Hospital Admission He was admitted to the hospital because of respiratory failure, signs of sepsis and severe pancytopenia   08/17/2014 - 01/24/2015 Chemotherapy He was started on Ibrutinib and high dose prednisone, treatment stopped due to recurrent disease   10/24/2014 Imaging Repeat PET scan show marked reduction in splenomegaly and resolving hypermetabolic activity   9/79/8921 Imaging ECHO showed normal EF   01/25/2015 Imaging CT scan of chest, abdomen and pelvis showed recurrent splenomegaly and lymphadenopathy   Today is cycle 1, day 3 of R-EPOCH Subjective: He complained of nausea today but no vomiting. He took Lovenox  yesterday as well as Remeron. He claimed he is not depressed. He slept well last night. His appetite is stable. Denies any constipation. No fevers or chills. His abdominal discomfort is about the same.   Objective:  Filed Vitals:   01/30/15 0544  BP: 137/61  Pulse: 54  Temp: 97.6 F (36.4 C)  Resp: 18     Intake/Output Summary (Last 24 hours) at 01/30/15 0815 Last data filed at 01/30/15 0541  Gross per 24 hour  Intake 2592.3 ml  Output   1630 ml  Net  962.3 ml    GENERAL:alert, no distress and comfortable SKIN: skin color, texture, turgor are normal, no rashes or significant lesions EYES: normal, Conjunctiva are pink and non-injected, sclera clear OROPHARYNX:no exudate, no erythema and lips, buccal mucosa, and tongue normal  NECK: supple, thyroid normal size, non-tender, without nodularity LYMPH:  no palpable lymphadenopathy in the cervical, axillary or inguinal LUNGS: clear to auscultation and percussion with normal breathing effort HEART: regular rate & rhythm and no murmurs and no lower extremity edema ABDOMEN:abdomen soft, non-tender and normal bowel sounds Musculoskeletal:no cyanosis of digits and no clubbing  NEURO: alert & oriented x 3 with fluent speech, no focal motor/sensory deficits   Labs:  Lab Results  Component Value Date   WBC 4.4 01/30/2015   HGB 8.3* 01/30/2015   HCT 25.4* 01/30/2015   MCV 94.1 01/30/2015   PLT 65* 01/30/2015   NEUTROABS 1.2* 01/30/2015    Lab Results  Component Value Date   NA 143 01/30/2015   K 3.8 01/30/2015   CL 116* 01/30/2015   CO2 20* 01/30/2015   Assessment & Plan:   Recurrent  mantle cell lymphoma He is admitted on 01/28/2015 for R-EPOCH as salvage treatment. He tolerated treatment well up off from some nausea but no vomiting. On day 2, he had modified rituximab infusion and did well I plan to continue the rest of the chemotherapy the same under 22 hours infusion.  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 The patient denies recent history of bleeding such as epistaxis, hematuria or hematochezia. He is asymptomatic from the anemia. We will observe for now. He does not require transfusion now unless hemoglobin is less than 8 g. I will not check CBC tomorrow, will recheck on Friday  Thrombocytopenia with splenomegaly This is due to splenomegaly and relapsed disease. I recommend he hold aspirin. He can have DVT prophylaxis as well as blood count is greater than 50,000  High risk tumor lysis syndrome He received rasburicase on 01/28/2015.  He will continue IVF and allopurinol. Monitor labs carefully, so far no signs of tumor lysis syndrome  DVT prophylaxis Hold if platelet is less than 50,000 I encouraged him to get Lovenox for DVT prophylaxis.  Code status Full code  Situational depression The patient claimed that he is not depressed I encourage him to take Remeron to help with sleep  Chemotherapy-induced nausea without vomiting Continue anti-emetics. He will continue IV fluids.  Discharge planning Discharge Friday once his chemotherapy is completed  [facility], [place], MD 01/30/2015  8:15 AM

## 2015-01-31 ENCOUNTER — Other Ambulatory Visit: Payer: Self-pay | Admitting: Hematology and Oncology

## 2015-01-31 MED ORDER — VINCRISTINE SULFATE CHEMO INJECTION 1 MG/ML
Freq: Once | INTRAVENOUS | Status: AC
  Administered 2015-01-31: 09:00:00 via INTRAVENOUS
  Filled 2015-01-31: qty 11

## 2015-01-31 MED ORDER — SODIUM CHLORIDE 0.9 % IV SOLN
Freq: Once | INTRAVENOUS | Status: AC
  Administered 2015-01-31: 8 mg via INTRAVENOUS
  Filled 2015-01-31: qty 4

## 2015-01-31 NOTE — Plan of Care (Signed)
Problem: Phase I Progression Outcomes Goal: IV site with good blood return Outcome: Completed/Met Date Met:  01/31/15 Right chest port has good blood return

## 2015-01-31 NOTE — Progress Notes (Signed)
Chemo dosages and calculations of adriamycin/etoposide /oncovin done with 2nd RN Laural Benes RN.

## 2015-01-31 NOTE — Progress Notes (Signed)
Manuel [name]   DOB:January 18, 1941   FU#:[id number]    Oncology History   Mantle cell lymphoma   Primary site: Lymphoid Neoplasms (Bilateral)   Staging method: AJCC 6th Edition   Clinical: Stage IV signed by Heath Lark, MD on 09/26/2013 10:53 AM   Pathologic: Stage IV signed by Heath Lark, MD on 09/26/2013 10:53 AM   Summary: Stage IV       Mantle cell lymphoma   09/12/2013 Procedure Patient have right axillary lymph node biopsy that confirmed B-cell, non-Hodgkin's lymphoma, suspect mantle cell lymphoma   09/12/2013 Procedure Patient had placement of Infuse-a-Port   09/16/2013 Imaging PET/CT scan showed multifocal hypermetabolic nodal activity involving the neck, chest, abdomen and pelvis as described. In addition, there is moderate splenomegaly with associated hypermetabolic activity consistent with lymphomatous involvement   09/21/2013 - 01/26/2014 Chemotherapy The patient received 6 cycles of bendamustine and rituximab   01/24/2014 Imaging Repeat PET/CT scan showed near complete response to treatment.   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   07/13/2014 Imaging  repeat PET CT scan show complete remission   08/10/2014 Imaging  repeat CT scan of the neck, chest and abdomen show disease relapse   08/16/2014 - 08/22/2014 Hospital Admission He was admitted to the hospital because of respiratory failure, signs of sepsis and severe pancytopenia   08/17/2014 - 01/24/2015 Chemotherapy He was started on Ibrutinib and high dose prednisone, treatment stopped due to recurrent disease   10/24/2014 Imaging Repeat PET scan show marked reduction in splenomegaly and resolving hypermetabolic activity   09/04/5425 Imaging ECHO showed normal EF   01/25/2015 Imaging CT scan of chest, abdomen and pelvis showed recurrent splenomegaly and lymphadenopathy    Subjective:  Today is cycle 1, day 4 of R-EPOCH Subjective: He complained of nausea today but no vomiting.  He did not sleep well last night due to beeping from the infusion machine. His appetite is stable. Denies any constipation. No fevers or chills. His abdominal discomfort is about the same. He is concerned about going home to [facility] tomorrow. He is also concerned about Neulasta injection next week.  Objective:  Filed Vitals:   01/31/15 0511  BP: 138/68  Pulse: 85  Temp: 97.5 F (36.4 C)  Resp: 16     Intake/Output Summary (Last 24 hours) at 01/31/15 0941 Last data filed at 01/31/15 0623  Gross per 24 hour  Intake   3898 ml  Output   3775 ml  Net    123 ml    GENERAL:alert, no distress and comfortable SKIN: skin color, texture, turgor are normal, no rashes or significant lesions EYES: normal, Conjunctiva are pink and non-injected, sclera clear OROPHARYNX:no exudate, no erythema and lips, buccal mucosa, and tongue normal  NECK: supple, thyroid normal size, non-tender, without nodularity LYMPH:  no palpable lymphadenopathy in the cervical, axillary or inguinal LUNGS: clear to auscultation and percussion with normal breathing effort HEART: regular rate & rhythm and no murmurs and no lower extremity edema ABDOMEN:abdomen soft, non-tender and normal bowel sounds Musculoskeletal:no cyanosis of digits and no clubbing  NEURO: alert & oriented x 3 with fluent speech, no focal motor/sensory deficits   Labs:  Lab Results  Component Value Date   WBC 4.4 01/30/2015   HGB 8.3* 01/30/2015   HCT 25.4* 01/30/2015   MCV 94.1 01/30/2015   PLT 65* 01/30/2015   NEUTROABS 1.2* 01/30/2015    Lab Results  Component Value Date   NA 143 01/30/2015  K 3.8 01/30/2015   CL 116* 01/30/2015   CO2 20* 01/30/2015   Assessment & Plan:   Recurrent mantle cell lymphoma He is admitted on 01/28/2015 for R-EPOCH as salvage treatment. He tolerated treatment well up off from some nausea but no vomiting. On day 2, he had modified rituximab infusion and did well I plan to continue the rest of the  chemotherapy the same under 22 hours infusion. He can discharged home tomorrow after chemotherapy is completed  Anemia in neoplastic disease This is likely anemia of chronic disease and underlying disease. The patient denies recent history of bleeding such as epistaxis, hematuria or hematochezia. He is asymptomatic from the anemia. We will observe for now. He does not require transfusion now unless hemoglobin is less than 8 g. 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 I recommend he hold aspirin. He can have DVT prophylaxis as well as blood count is greater than 50,000  High risk tumor lysis syndrome He received rasburicase on 01/28/2015.  He will continue IVF and allopurinol. Monitor labs carefully, so far no signs of tumor lysis syndrome  DVT prophylaxis Hold if platelet is less than 50,000 I encouraged him to get Lovenox for DVT prophylaxis.  Code status Full code  Situational depression The patient claimed that he is not depressed I encourage him to take Remeron to help with sleep  Chemotherapy-induced nausea without vomiting Continue anti-emetics. He will continue IV fluids. I have spoken with the pharmacist. We will add Aloxi to chemotherapy premedication tomorrow and switch out Zofran  Discharge planning Discharge Friday once his chemotherapy is completed I spoke with the patient extensively and his daughter. His daughter is willing to take him home to her house over the weekend. I tried to reassure both the patient and her daughter that the majority of the side effects that he had from the past is likely from chemotherapy and not from Neulasta. Neulasta is consider preventative injection and is not absolutely necessary part of his chemotherapy treatment. He will not get Neulasta this weekend, rather next week. Overall spent 1 hour including greater than 30 minutes on counseling alone  Lyndsie Wallman,  MD 01/31/2015  9:41 AM

## 2015-01-31 NOTE — Care Management (Signed)
Important Message  Patient Details  Name: Manuel [name] MRN: [id number] Date of Birth: 06-29-1941   Medicare Important Message Given:  Yes-second notification given    Shelda Altes, LCSW 01/31/2015, 2:52 PM

## 2015-01-31 NOTE — Progress Notes (Signed)
BSA, Dosages and Dilutions for Adriamycin, Vepesid and Oncovin verified with 2nd RN Laural Benes.

## 2015-02-01 ENCOUNTER — Telehealth: Payer: Self-pay | Admitting: *Deleted

## 2015-02-01 LAB — COMPREHENSIVE METABOLIC PANEL
ALT: 88 U/L — ABNORMAL HIGH (ref 17–63)
ANION GAP: 7 (ref 5–15)
AST: 46 U/L — ABNORMAL HIGH (ref 15–41)
Albumin: 3 g/dL — ABNORMAL LOW (ref 3.5–5.0)
Alkaline Phosphatase: 56 U/L (ref 38–126)
BUN: 29 mg/dL — ABNORMAL HIGH (ref 6–20)
CALCIUM: 8.2 mg/dL — AB (ref 8.9–10.3)
CHLORIDE: 114 mmol/L — AB (ref 101–111)
CO2: 21 mmol/L — AB (ref 22–32)
Creatinine, Ser: 1.03 mg/dL (ref 0.61–1.24)
GFR calc non Af Amer: >60 mL/min (ref >60)
Glucose, Bld: 107 mg/dL — ABNORMAL HIGH (ref 65–99)
POTASSIUM: 3.8 mmol/L (ref 3.5–5.1)
SODIUM: 142 mmol/L (ref 135–145)
TOTAL PROTEIN: 4.8 g/dL — AB (ref 6.5–8.1)
Total Bilirubin: 0.8 mg/dL (ref 0.3–1.2)

## 2015-02-01 LAB — CBC WITH DIFFERENTIAL/PLATELET
BASOS ABS: 0 10*3/uL (ref 0.0–0.1)
BASOS PCT: 0 % (ref 0–1)
Eosinophils Absolute: 0 10*3/uL (ref 0.0–0.7)
Eosinophils Relative: 0 % (ref 0–5)
HCT: 24.9 % — ABNORMAL LOW (ref 39.0–52.0)
Hemoglobin: 8.4 g/dL — ABNORMAL LOW (ref 13.0–17.0)
Lymphocytes Relative: 23 % (ref 12–46)
Lymphs Abs: 0.6 10*3/uL — ABNORMAL LOW (ref 0.7–4.0)
MCH: 30.5 pg (ref 26.0–34.0)
MCHC: 33.7 g/dL (ref 30.0–36.0)
MCV: 90.5 fL (ref 78.0–100.0)
MONOS PCT: 8 % (ref 3–12)
Monocytes Absolute: 0.2 10*3/uL (ref 0.1–1.0)
Neutro Abs: 1.7 10*3/uL (ref 1.7–7.7)
Neutrophils Relative %: 68 % (ref 43–77)
Platelets: 72 10*3/uL — ABNORMAL LOW (ref 150–400)
RBC: 2.75 MIL/uL — ABNORMAL LOW (ref 4.22–5.81)
RDW: 15.5 % (ref 11.5–15.5)
WBC: 2.5 10*3/uL — ABNORMAL LOW (ref 4.0–10.5)

## 2015-02-01 LAB — URIC ACID: Uric Acid, Serum: 5.7 mg/dL (ref 4.4–7.6)

## 2015-02-01 MED ORDER — LORAZEPAM 0.5 MG PO TABS: 0.5000 mg | ORAL_TABLET | Freq: Four times a day (QID) | ORAL | Status: DC | PRN

## 2015-02-01 MED ORDER — SODIUM CHLORIDE 0.9 % IV SOLN
10.0000 mg | Freq: Once | INTRAVENOUS | Status: AC
  Administered 2015-02-01: 10 mg via INTRAVENOUS
  Filled 2015-02-01: qty 1

## 2015-02-01 MED ORDER — PROCHLORPERAZINE 25 MG RE SUPP: 25.0000 mg | Freq: Two times a day (BID) | RECTAL | Status: DC | PRN

## 2015-02-01 MED ORDER — DEXAMETHASONE 4 MG PO TABS: 8.0000 mg | ORAL_TABLET | Freq: Two times a day (BID) | ORAL | Status: DC

## 2015-02-01 MED ORDER — ONDANSETRON HCL 8 MG PO TABS: 8.0000 mg | ORAL_TABLET | Freq: Two times a day (BID) | ORAL | Status: DC

## 2015-02-01 MED ORDER — SODIUM CHLORIDE 0.9 % IV SOLN
750.0000 mg/m2 | Freq: Once | INTRAVENOUS | Status: AC
  Administered 2015-02-01: 1660 mg via INTRAVENOUS
  Filled 2015-02-01: qty 83

## 2015-02-01 MED ORDER — HEPARIN SOD (PORK) LOCK FLUSH 100 UNIT/ML IV SOLN
500.0000 [IU] | Freq: Once | INTRAVENOUS | Status: AC
  Administered 2015-02-01: 500 [IU] via INTRAVENOUS
  Filled 2015-02-01: qty 5

## 2015-02-01 MED ORDER — PROCHLORPERAZINE MALEATE 10 MG PO TABS: 10.0000 mg | ORAL_TABLET | Freq: Four times a day (QID) | ORAL | Status: DC | PRN

## 2015-02-01 MED ORDER — PALONOSETRON HCL INJECTION 0.25 MG/5ML
0.2500 mg | Freq: Once | INTRAVENOUS | Status: AC
Start: 2015-02-01 — End: 2015-02-01
  Administered 2015-02-01: 0.25 mg via INTRAVENOUS
  Filled 2015-02-01: qty 5

## 2015-02-01 NOTE — Progress Notes (Signed)
Manual calculation of BSA and dosing for cyclophosphamide completed.  Secondary verification by Reyne Dumas, RN

## 2015-02-01 NOTE — Discharge Summary (Signed)
Physician Discharge Summary  Patient ID: Manuel [name] MRN: [id number] DOB/AGE: 73/73/1942 73 y.o.  Admit date: 01/28/2015 Discharge date: 02/01/2015  Primary Care Physician:  Pcp Not In System   Discharge Diagnoses:    Present on Admission:  . Mantle cell lymphoma . Anemia in neoplastic disease . Thrombocytopenia  Discharge Medications:    Medication List    STOP taking these medications        aspirin 81 MG tablet     hydrochlorothiazide 12.5 MG tablet  Commonly known as:  HYDRODIURIL     predniSONE 20 MG tablet  Commonly known as:  DELTASONE      TAKE these medications        acetaminophen 500 MG tablet  Commonly known as:  TYLENOL  Take 500 mg by mouth every 6 (six) hours as needed for moderate pain or fever.     acyclovir 400 MG tablet  Commonly known as:  ZOVIRAX  Take 1 tablet (400 mg total) by mouth daily.     allopurinol 300 MG tablet  Commonly known as:  ZYLOPRIM  Take 1 tablet (300 mg total) by mouth daily.     cholecalciferol 1000 UNITS tablet  Commonly known as:  VITAMIN D  Take 2,000 Units by mouth every morning.     dexamethasone 4 MG tablet  Commonly known as:  DECADRON  Take 2 tablets (8 mg total) by mouth 2 (two) times daily with a meal. Take two times a day starting the day after chemotherapy for 3 days.     levothyroxine 75 MCG tablet  Commonly known as:  SYNTHROID, LEVOTHROID  TAKE 1 TABLET EVERY DAY     loratadine 10 MG tablet  Commonly known as:  CLARITIN  Take 10 mg by mouth every morning.     LORazepam 0.5 MG tablet  Commonly known as:  ATIVAN  Take 1 tablet (0.5 mg total) by mouth every 6 (six) hours as needed (Nausea or vomiting).     Multiple Vitamin tablet  Take 1 tablet by mouth daily.     ondansetron 8 MG tablet  Commonly known as:  ZOFRAN  Take 1 tablet (8 mg total) by mouth 2 (two) times daily. Take two times a day starting the day after chemo for 3 days. Then take two times a day as needed for nausea or  vomiting.     PROBIOTIC PO  Take 1 tablet by mouth daily.     prochlorperazine 10 MG tablet  Commonly known as:  COMPAZINE  Take 1 tablet (10 mg total) by mouth every 6 (six) hours as needed for nausea or vomiting.     prochlorperazine 10 MG tablet  Commonly known as:  COMPAZINE  Take 1 tablet (10 mg total) by mouth every 6 (six) hours as needed (Nausea or vomiting).     prochlorperazine 25 MG suppository  Commonly known as:  COMPAZINE  Place 1 suppository (25 mg total) rectally every 12 (twelve) hours as needed for nausea.         Disposition and Follow-up:   Significant Diagnostic Studies:  Ct Chest W Contrast  01/25/2015   CLINICAL DATA:  Mantle cell lymphoma  EXAM: CT CHEST, ABDOMEN, AND PELVIS WITH CONTRAST  TECHNIQUE: Multidetector CT imaging of the chest, abdomen and pelvis was performed following the standard protocol during bolus administration of intravenous contrast.  CONTRAST:  117mL OMNIPAQUE IOHEXOL 300 MG/ML  SOLN  COMPARISON:  PET-CT 10/24/2014 and prior CT scan 08/10/2014  FINDINGS: CT CHEST  FINDINGS  Chest wall: No chest wall mass, supraclavicular or axillary adenopathy. A few small scattered lymph nodes are noted. A right-sided Port-A-Cath is in place. The bony thorax is intact. No destructive bone lesions or spinal canal compromise.  Mediastinum: The heart is normal in size. No pericardial effusion. Stable aortic and coronary artery calcifications. The esophagus is grossly normal. There are small scattered mediastinal and hilar lymph nodes but no mass or adenopathy.  Lungs/ pleura: Small scattered subpleural nodules are likely lymph nodes. Most of these appear slightly larger. No infiltrates, edema or effusions.  CT ABDOMEN AND PELVIS FINDINGS  Hepatobiliary: No focal hepatic lesions or intrahepatic biliary dilatation. Gallbladder is normal. No common bile duct dilatation.  Pancreas: No mass, inflammation or ductal dilatation. Stable mild pancreatic atrophy and slightly  prominent main pancreatic duct.  Spleen: Persistent splenomegaly. The spleen measures 18.2 x 18.1 x 10.8 Cm. Splenic volume is 1778 cubic cm. This represents a significant increase in size since the PET-CT in March. Small peripheral splenic infarcts are noted.  Adrenals/Urinary Tract: The adrenal glands and kidneys are unremarkable and stable.  Stomach/Bowel: The stomach, duodenum, small bowel and colon are unremarkable. No inflammatory changes, mass lesions or obstructive findings. Moderate to advanced diverticulosis involving the sigmoid colon but no findings for acute diverticulitis. The terminal ileum is normal. The appendix is normal.  Vascular/Lymphatic: Periportal and celiac axis adenopathy. Index node on image number 62 measures 20 mm. This measured 18 mm on the CT scan from 08/10/2014 and 12 mm on the previous PET-CT. Hepatic duodenum ligament lymph node on image number 66 measures 17 mm in was 7 mm on the prior PET-CT. Numerous other lymph nodes have enlarged since the prior PET-CT. Small scattered retroperitoneal lymph nodes. The aorta demonstrates stable atherosclerotic disease. The major venous structures are patent. Very prominent splenic vein.  Other: The bladder, prostate gland and seminal vesicles are unremarkable. No pelvic mass or adenopathy. No free pelvic fluid collections. No inguinal mass or adenopathy.  Musculoskeletal: No significant bony findings. Stable advanced degenerative changes involving the spine and bilateral pars defects at L5.  IMPRESSION: Recurrent splenomegaly and upper abdominal lymphadenopathy suggesting recurrent lymphoma. This study looks very similar to the CT scan from 08/10/2014.   Electronically Signed   By: Marijo Sanes M.D.   On: 01/25/2015 12:44   Ct Abdomen Pelvis W Contrast  01/25/2015   CLINICAL DATA:  Mantle cell lymphoma  EXAM: CT CHEST, ABDOMEN, AND PELVIS WITH CONTRAST  TECHNIQUE: Multidetector CT imaging of the chest, abdomen and pelvis was performed  following the standard protocol during bolus administration of intravenous contrast.  CONTRAST:  12mL OMNIPAQUE IOHEXOL 300 MG/ML  SOLN  COMPARISON:  PET-CT 10/24/2014 and prior CT scan 08/10/2014  FINDINGS: CT CHEST FINDINGS  Chest wall: No chest wall mass, supraclavicular or axillary adenopathy. A few small scattered lymph nodes are noted. A right-sided Port-A-Cath is in place. The bony thorax is intact. No destructive bone lesions or spinal canal compromise.  Mediastinum: The heart is normal in size. No pericardial effusion. Stable aortic and coronary artery calcifications. The esophagus is grossly normal. There are small scattered mediastinal and hilar lymph nodes but no mass or adenopathy.  Lungs/ pleura: Small scattered subpleural nodules are likely lymph nodes. Most of these appear slightly larger. No infiltrates, edema or effusions.  CT ABDOMEN AND PELVIS FINDINGS  Hepatobiliary: No focal hepatic lesions or intrahepatic biliary dilatation. Gallbladder is normal. No common bile duct dilatation.  Pancreas: No mass, inflammation or ductal  dilatation. Stable mild pancreatic atrophy and slightly prominent main pancreatic duct.  Spleen: Persistent splenomegaly. The spleen measures 18.2 x 18.1 x 10.8 Cm. Splenic volume is 1778 cubic cm. This represents a significant increase in size since the PET-CT in March. Small peripheral splenic infarcts are noted.  Adrenals/Urinary Tract: The adrenal glands and kidneys are unremarkable and stable.  Stomach/Bowel: The stomach, duodenum, small bowel and colon are unremarkable. No inflammatory changes, mass lesions or obstructive findings. Moderate to advanced diverticulosis involving the sigmoid colon but no findings for acute diverticulitis. The terminal ileum is normal. The appendix is normal.  Vascular/Lymphatic: Periportal and celiac axis adenopathy. Index node on image number 62 measures 20 mm. This measured 18 mm on the CT scan from 08/10/2014 and 12 mm on the previous  PET-CT. Hepatic duodenum ligament lymph node on image number 66 measures 17 mm in was 7 mm on the prior PET-CT. Numerous other lymph nodes have enlarged since the prior PET-CT. Small scattered retroperitoneal lymph nodes. The aorta demonstrates stable atherosclerotic disease. The major venous structures are patent. Very prominent splenic vein.  Other: The bladder, prostate gland and seminal vesicles are unremarkable. No pelvic mass or adenopathy. No free pelvic fluid collections. No inguinal mass or adenopathy.  Musculoskeletal: No significant bony findings. Stable advanced degenerative changes involving the spine and bilateral pars defects at L5.  IMPRESSION: Recurrent splenomegaly and upper abdominal lymphadenopathy suggesting recurrent lymphoma. This study looks very similar to the CT scan from 08/10/2014.   Electronically Signed   By: Marijo Sanes M.D.   On: 01/25/2015 12:44    Discharge Laboratory Values: Lab Results  Component Value Date   WBC 2.5* 02/01/2015   HGB 8.4* 02/01/2015   HCT 24.9* 02/01/2015   MCV 90.5 02/01/2015   PLT 72* 02/01/2015   Lab Results  Component Value Date   NA 142 02/01/2015   K 3.8 02/01/2015   CL 114* 02/01/2015   CO2 21* 02/01/2015    Brief H and P: For complete details please refer to admission H and P, but in brief, the patient was admitted to the hospital to start cycle 1 of chemotherapy for recurrent Mantle cell lymphoma  Physical Exam at Discharge: BP 135/75 mmHg  Pulse 48  Temp(Src) 97.5 F (36.4 C) (Oral)  Resp 16  Ht 6\' 3"  (1.905 m)  Wt 209 lb (94.802 kg)  BMI 26.12 kg/m2  SpO2 98% GENERAL:alert, no distress and comfortable SKIN: skin color, texture, turgor are normal, no rashes or significant lesions EYES: normal, Conjunctiva are pink and non-injected, sclera clear OROPHARYNX:no exudate, no erythema and lips, buccal mucosa, and tongue normal  NECK: supple, thyroid normal size, non-tender, without nodularity LYMPH: no palpable  lymphadenopathy in the cervical, axillary or inguinal LUNGS: clear to auscultation and percussion with normal breathing effort HEART: regular rate & rhythm and no murmurs and no lower extremity edema ABDOMEN:abdomen soft, non-tender and normal bowel sounds Musculoskeletal:no cyanosis of digits and no clubbing  NEURO: alert & oriented x 3 with fluent speech, no focal motor/sensory deficits  Hospital Course:  Active Problems:   Mantle cell lymphoma   Anemia in neoplastic disease   Thrombocytopenia  Recurrent mantle cell lymphoma He is admitted on 01/28/2015 for R-EPOCH as salvage treatment. He tolerated treatment well up off from some nausea but no vomiting. On day 2, he had modified rituximab infusion and did well Overall, he completed all his treatment without major side effects apart from nausea  Anemia in neoplastic disease This is likely  anemia of chronic disease and underlying disease. The patient denies recent history of bleeding such as epistaxis, hematuria or hematochezia. He is asymptomatic from the anemia. We will observe for now. He does not require transfusion now unless hemoglobin is less than 8 g.  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 I recommend he hold aspirin. His discharge platelet count is stable.  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 So far no signs of tumor lysis syndrome  Code status Full code  Chemotherapy-induced nausea without vomiting This is improved with IV antiemetics and IV fluids.  Diet:  Regular  Activity:  As tolerated  Condition at Discharge:   Stable  Signed: Dr. Heath Lark [phone]  02/01/2015, 1:27 PM

## 2015-02-01 NOTE — Telephone Encounter (Signed)
VM message received from pt's daughter @ 3:26 pm. Her request is for a dexamethasone prescription that was not given to pt at time of discharge today.   TC back to Candace. This prescription has been called in. Apparently it was called in to [facility] in [place], [place] and that Suzie Portela is transferring it to the [facility] in [place].  She did also have a  Question regarding the prednisone. He is to stop the prednisone per discharge instructions and resume dexamthasone as directed.  Candace verbalized understanding. No other issues identified.

## 2015-02-01 NOTE — Progress Notes (Signed)
Discharge instructions reviewed with pt. Port de-accessed and IV removed. No current questions or concerns. Waiting for daughter to arrive for transportation. Pt verbally understands his next appointment time and location.

## 2015-02-01 NOTE — Progress Notes (Signed)
Nutrition Follow-up  DOCUMENTATION CODES:  Non-severe (moderate) malnutrition in context of chronic illness  INTERVENTION: - Continue to provide snacks - Continue Carnation Instant Breakfast BID, each supplement provides 220 kcal and 13 grams of protein - RD will continue to monitor for needs  NUTRITION DIAGNOSIS:  Malnutrition related to chronic illness as evidenced by energy intake < or equal to 75% for > or equal to 1 month, mild depletion of body fat, moderate depletions of muscle mass. -ongoing  GOAL:  Patient will meet greater than or equal to 90% of their needs -likely met  MONITOR:  PO intake, Supplement acceptance, Labs, Weight trends, Skin, I & O's  ASSESSMENT: 74 y.o. male is here because of recent diagnosis of relapsed mantle cell lymphoma. Pt receiving chemotherapy treatments inpatient.  Pt to d/c today after chemotherapy per MD note yesterday AM. Pt has been consuming 75-100% of meal and supplements since last assessment and is meeting needs without issue.  Medications reviewed. Labs reviewed; Cl: 114 mmol/L, Ca: 8.2 mg/dL, BUN elevated.  Height:  Ht Readings from Last 1 Encounters:  01/28/15 6' 3"  (1.905 m)    Weight:  Wt Readings from Last 1 Encounters:  01/29/15 209 lb (94.802 kg)    Ideal Body Weight:  89.1 kg  Wt Readings from Last 10 Encounters:  01/29/15 209 lb (94.802 kg)  01/24/15 204 lb 14.4 oz (92.942 kg)  10/25/14 210 lb 3.2 oz (95.346 kg)  09/25/14 206 lb (93.441 kg)  09/25/14 206 lb (93.441 kg)  08/28/14 200 lb 14.4 oz (91.128 kg)  08/17/14 205 lb 11.2 oz (93.305 kg)  08/13/14 199 lb 6.4 oz (90.447 kg)  08/07/14 201 lb 1.6 oz (91.218 kg)  06/07/14 200 lb 6.4 oz (90.901 kg)    BMI:  Body mass index is 26.12 kg/(m^2).  Estimated Nutritional Needs:  Kcal:  2536-6440  Protein:  130-140g  Fluid:  2.3L/day  Skin:  Reviewed, no issues  Diet Order:  Diet regular Room service appropriate?: Yes; Fluid consistency::  Thin  EDUCATION NEEDS:  No education needs identified at this time   Intake/Output Summary (Last 24 hours) at 02/01/15 1057 Last data filed at 02/01/15 0900  Gross per 24 hour  Intake   4724 ml  Output   2200 ml  Net   2524 ml    Last BM:  PTA    Jarome Matin, RD, LDN Inpatient Clinical Dietitian Pager # [phone] After hours/weekend pager # [phone]

## 2015-02-05 ENCOUNTER — Ambulatory Visit (HOSPITAL_BASED_OUTPATIENT_CLINIC_OR_DEPARTMENT_OTHER): Payer: Medicare PPO

## 2015-02-05 ENCOUNTER — Telehealth: Payer: Self-pay | Admitting: Hematology and Oncology

## 2015-02-05 ENCOUNTER — Ambulatory Visit (HOSPITAL_BASED_OUTPATIENT_CLINIC_OR_DEPARTMENT_OTHER): Payer: Medicare PPO | Admitting: Hematology and Oncology

## 2015-02-05 ENCOUNTER — Ambulatory Visit: Payer: Medicare PPO

## 2015-02-05 ENCOUNTER — Encounter: Payer: Self-pay | Admitting: Hematology and Oncology

## 2015-02-05 VITALS — BP 150/68 | HR 79 | Temp 98.7°F | Resp 19 | Ht 75.0 in | Wt 204.9 lb

## 2015-02-05 DIAGNOSIS — I10 Essential (primary) hypertension: Secondary | ICD-10-CM

## 2015-02-05 DIAGNOSIS — G622 Polyneuropathy due to other toxic agents: Secondary | ICD-10-CM | POA: Diagnosis not present

## 2015-02-05 DIAGNOSIS — D696 Thrombocytopenia, unspecified: Secondary | ICD-10-CM

## 2015-02-05 DIAGNOSIS — D630 Anemia in neoplastic disease: Secondary | ICD-10-CM | POA: Diagnosis not present

## 2015-02-05 DIAGNOSIS — T451X5A Adverse effect of antineoplastic and immunosuppressive drugs, initial encounter: Secondary | ICD-10-CM

## 2015-02-05 DIAGNOSIS — G620 Drug-induced polyneuropathy: Secondary | ICD-10-CM | POA: Insufficient documentation

## 2015-02-05 DIAGNOSIS — M1000 Idiopathic gout, unspecified site: Secondary | ICD-10-CM

## 2015-02-05 DIAGNOSIS — C8310 Mantle cell lymphoma, unspecified site: Secondary | ICD-10-CM

## 2015-02-05 LAB — COMPREHENSIVE METABOLIC PANEL (CC13)
ALK PHOS: 71 U/L (ref 40–150)
ALT: 101 U/L — ABNORMAL HIGH (ref 0–55)
AST: 34 U/L (ref 5–34)
Albumin: 3.4 g/dL — ABNORMAL LOW (ref 3.5–5.0)
Anion Gap: 11 mEq/L (ref 3–11)
BILIRUBIN TOTAL: 0.54 mg/dL (ref 0.20–1.20)
BUN: 28.5 mg/dL — AB (ref 7.0–26.0)
CO2: 20 mEq/L — ABNORMAL LOW (ref 22–29)
CREATININE: 1 mg/dL (ref 0.7–1.3)
Calcium: 8.6 mg/dL (ref 8.4–10.4)
Chloride: 108 mEq/L (ref 98–109)
EGFR: 73 mL/min/{1.73_m2} — AB (ref >90)
GLUCOSE: 97 mg/dL (ref 70–140)
Potassium: 3.7 mEq/L (ref 3.5–5.1)
SODIUM: 139 meq/L (ref 136–145)
TOTAL PROTEIN: 5.2 g/dL — AB (ref 6.4–8.3)

## 2015-02-05 LAB — CBC WITH DIFFERENTIAL/PLATELET
BASO%: 0.1 % (ref 0.0–2.0)
BASOS ABS: 0 10*3/uL (ref 0.0–0.1)
EOS ABS: 0.2 10*3/uL (ref 0.0–0.5)
EOS%: 2.1 % (ref 0.0–7.0)
HEMATOCRIT: 28.3 % — AB (ref 38.4–49.9)
HGB: 9.6 g/dL — ABNORMAL LOW (ref 13.0–17.1)
LYMPH#: 0.5 10*3/uL — AB (ref 0.9–3.3)
LYMPH%: 5.7 % — ABNORMAL LOW (ref 14.0–49.0)
MCH: 30.8 pg (ref 27.2–33.4)
MCHC: 34 g/dL (ref 32.0–36.0)
MCV: 90.7 fL (ref 79.3–98.0)
MONO#: 0 10*3/uL — ABNORMAL LOW (ref 0.1–0.9)
MONO%: 0.2 % (ref 0.0–14.0)
NEUT#: 7.3 10*3/uL — ABNORMAL HIGH (ref 1.5–6.5)
NEUT%: 91.9 % — AB (ref 39.0–75.0)
Platelets: 72 10*3/uL — ABNORMAL LOW (ref 140–400)
RBC: 3.12 10*6/uL — ABNORMAL LOW (ref 4.20–5.82)
RDW: 15.9 % — AB (ref 11.0–14.6)
WBC: 7.9 10*3/uL (ref 4.0–10.3)

## 2015-02-05 LAB — HOLD TUBE, BLOOD BANK

## 2015-02-05 LAB — LACTATE DEHYDROGENASE (CC13): LDH: 196 U/L (ref 125–245)

## 2015-02-05 NOTE — Assessment & Plan Note (Signed)
This is related to side effects of chemotherapy. I plan to reduce the dose of vincristine in the future.

## 2015-02-05 NOTE — Assessment & Plan Note (Signed)
This is due to splenomegaly and relapsed disease. I recommend he hold aspirin. The patient denies any recent signs or symptoms of bleeding such as spontaneous epistaxis, hematuria or hematochezia.

## 2015-02-05 NOTE — Telephone Encounter (Signed)
Pt confirmed labs/ov per 07/05 POF, gave pt AVS and Calendar.... KJ °

## 2015-02-05 NOTE — Assessment & Plan Note (Signed)
He has started to become hypertensive after I discontinue hydrochlorothiazide. He is symptomatic and I plan to observe for now.

## 2015-02-05 NOTE — Assessment & Plan Note (Signed)
I have a long discussion with the patient and his daughter. I reviewed his case at the hematology tumor board today. Over consensus would be to proceed with similar chemotherapy for several cycles. The patient would benefit from further maintenance treatment in the future after he has completed current regimen. I discussed with him and his daughter about the use of Revlimid, Velcade and dexamethasone in the future.

## 2015-02-05 NOTE — Progress Notes (Unsigned)
No granix to be given per Dr Alvy Bimler

## 2015-02-05 NOTE — Assessment & Plan Note (Signed)
This is likely anemia of chronic disease and underlying disease. The patient denies recent history of bleeding such as epistaxis, hematuria or hematochezia. He is asymptomatic from the anemia. We will observe for now.  He does not require transfusion now.

## 2015-02-05 NOTE — Progress Notes (Signed)
[name] OFFICE PROGRESS NOTE  Patient Care Team: Pcp Not In System as PCP - [place], MD as Referring Physician (Hematology and Oncology)  SUMMARY OF ONCOLOGIC HISTORY: Oncology History   Mantle cell lymphoma   Primary site: Lymphoid Neoplasms (Bilateral)   Staging method: AJCC 6th Edition   Clinical: Stage IV signed by Heath Lark, MD on 09/26/2013 10:53 AM   Pathologic: Stage IV signed by Heath Lark, MD on 09/26/2013 10:53 AM   Summary: Stage IV       Mantle cell lymphoma   09/12/2013 Procedure Patient have right axillary lymph node biopsy that confirmed B-cell, non-Hodgkin's lymphoma, suspect mantle cell lymphoma   09/12/2013 Procedure Patient had placement of Infuse-a-Port   09/16/2013 Imaging PET/CT scan showed multifocal hypermetabolic nodal activity involving the neck, chest, abdomen and pelvis as described. In addition, there is moderate splenomegaly with associated hypermetabolic activity consistent with lymphomatous involvement   09/21/2013 - 01/26/2014 Chemotherapy The patient received 6 cycles of bendamustine and rituximab   01/24/2014 Imaging Repeat PET/CT scan showed near complete response to treatment.   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   07/13/2014 Imaging  repeat PET CT scan show complete remission   08/10/2014 Imaging  repeat CT scan of the neck, chest and abdomen show disease relapse   08/16/2014 - 08/22/2014 Hospital Admission He was admitted to the hospital because of respiratory failure, signs of sepsis and severe pancytopenia   08/17/2014 - 01/24/2015 Chemotherapy He was started on Ibrutinib and high dose prednisone, treatment stopped due to recurrent disease   10/24/2014 Imaging Repeat PET scan show marked reduction in splenomegaly and resolving hypermetabolic activity   9/37/1696 Imaging ECHO showed normal EF   01/25/2015 Imaging CT scan of chest, abdomen and pelvis  showed recurrent splenomegaly and lymphadenopathy   01/28/2015 [facility] Admission He was admitted to the hospital for cycle one of R-EPOCH    INTERVAL HISTORY: Please see below for problem oriented charting. He returns for further follow-up. He had pickups over the weekend, resolved. He complained of mild peripheral neuropathy. Overall, he feels well over the weekend without further nausea or vomiting.  REVIEW OF SYSTEMS:   Constitutional: Denies fevers, chills or abnormal weight loss Eyes: Denies blurriness of vision Ears, nose, mouth, throat, and face: Denies mucositis or sore throat Respiratory: Denies cough, dyspnea or wheezes Cardiovascular: Denies palpitation, chest discomfort or lower extremity swelling Gastrointestinal:  Denies nausea, heartburn or change in bowel habits Skin: Denies abnormal skin rashes Lymphatics: Denies new lymphadenopathy or easy bruising Neurological:Denies numbness, tingling or new weaknesses Behavioral/Psych: Mood is stable, no new changes  All other systems were reviewed with the patient and are negative.  I have reviewed the past medical history, past surgical history, social history and family history with the patient and they are unchanged from previous note.  ALLERGIES:  has No Known Allergies.  MEDICATIONS:  Current Outpatient Prescriptions  Medication Sig Dispense Refill  . acyclovir (ZOVIRAX) 400 MG tablet Take 1 tablet (400 mg total) by mouth daily. 90 tablet 3  . cholecalciferol (VITAMIN D) 1000 UNITS tablet Take 2,000 Units by mouth every morning.    Marland Kitchen levothyroxine (SYNTHROID, LEVOTHROID) 75 MCG tablet TAKE 1 TABLET EVERY DAY (Patient taking differently: TAKE 75 MCG BY MOUTH DAILY) 90 tablet 6  . loratadine (CLARITIN) 10 MG tablet Take 10 mg by mouth every morning.    . Multiple Vitamin tablet Take 1 tablet by  mouth daily.     . ondansetron (ZOFRAN) 8 MG tablet Take 1 tablet (8 mg total) by mouth 2 (two) times daily. Take two times a  day starting the day after chemo for 3 days. Then take two times a day as needed for nausea or vomiting. 30 tablet 1  . Probiotic Product (PROBIOTIC PO) Take 1 tablet by mouth daily.    Marland Kitchen acetaminophen (TYLENOL) 500 MG tablet Take 500 mg by mouth every 6 (six) hours as needed for moderate pain or fever.     Marland Kitchen allopurinol (ZYLOPRIM) 300 MG tablet Take 1 tablet (300 mg total) by mouth daily. (Patient not taking: Reported on 02/05/2015) 30 tablet 0  . dexamethasone (DECADRON) 4 MG tablet Take 2 tablets (8 mg total) by mouth 2 (two) times daily with a meal. Take two times a day starting the day after chemotherapy for 3 days. (Patient not taking: Reported on 02/05/2015) 30 tablet 1  . LORazepam (ATIVAN) 0.5 MG tablet Take 1 tablet (0.5 mg total) by mouth every 6 (six) hours as needed (Nausea or vomiting). (Patient not taking: Reported on 02/05/2015) 30 tablet 0  . prochlorperazine (COMPAZINE) 10 MG tablet Take 1 tablet (10 mg total) by mouth every 6 (six) hours as needed (Nausea or vomiting). (Patient not taking: Reported on 02/05/2015) 30 tablet 1  . prochlorperazine (COMPAZINE) 25 MG suppository Place 1 suppository (25 mg total) rectally every 12 (twelve) hours as needed for nausea. (Patient not taking: Reported on 02/05/2015) 12 suppository 3   No current facility-administered medications for this visit.    PHYSICAL EXAMINATION: ECOG PERFORMANCE STATUS: 1 - Symptomatic but completely ambulatory  Filed Vitals:   02/05/15 1043  BP: 150/68  Pulse: 79  Temp: 98.7 F (37.1 C)  Resp: 19   Filed Weights   02/05/15 1043  Weight: 204 lb 14.4 oz (92.942 kg)    GENERAL:alert, no distress and comfortable SKIN: skin color, texture, turgor are normal, no rashes or significant lesions EYES: normal, Conjunctiva are pink and non-injected, sclera clear OROPHARYNX:no exudate, no erythema and lips, buccal mucosa, and tongue normal  NECK: supple, thyroid normal size, non-tender, without nodularity LYMPH:  no  palpable lymphadenopathy in the cervical, axillary or inguinal LUNGS: clear to auscultation and percussion with normal breathing effort HEART: regular rate & rhythm and no murmurs and no lower extremity edema ABDOMEN:abdomen soft, non-tender and normal bowel sounds Musculoskeletal:no cyanosis of digits and no clubbing  NEURO: alert & oriented x 3 with fluent speech, no focal motor/sensory deficits  LABORATORY DATA:  I have reviewed the data as listed    Component Value Date/Time   NA 139 02/05/2015 1157   NA 142 02/01/2015 0444   K 3.7 02/05/2015 1157   K 3.8 02/01/2015 0444   CL 114* 02/01/2015 0444   CO2 20* 02/05/2015 1157   CO2 21* 02/01/2015 0444   GLUCOSE 97 02/05/2015 1157   GLUCOSE 107* 02/01/2015 0444   BUN 28.5* 02/05/2015 1157   BUN 29* 02/01/2015 0444   CREATININE 1.0 02/05/2015 1157   CREATININE 1.03 02/01/2015 0444   CALCIUM 8.6 02/05/2015 1157   CALCIUM 8.2* 02/01/2015 0444   PROT 5.2* 02/05/2015 1157   PROT 4.8* 02/01/2015 0444   ALBUMIN 3.4* 02/05/2015 1157   ALBUMIN 3.0* 02/01/2015 0444   AST 34 02/05/2015 1157   AST 46* 02/01/2015 0444   ALT 101* 02/05/2015 1157   ALT 88* 02/01/2015 0444   ALKPHOS 71 02/05/2015 1157   ALKPHOS 56 02/01/2015 0444  BILITOT 0.54 02/05/2015 1157   BILITOT 0.8 02/01/2015 0444   GFRNONAA >60 02/01/2015 0444   GFRAA >60 02/01/2015 0444    No results found for: SPEP, UPEP  Lab Results  Component Value Date   WBC 7.9 02/05/2015   NEUTROABS 7.3* 02/05/2015   HGB 9.6* 02/05/2015   HCT 28.3* 02/05/2015   MCV 90.7 02/05/2015   PLT 72* 02/05/2015      Chemistry      Component Value Date/Time   NA 139 02/05/2015 1157   NA 142 02/01/2015 0444   K 3.7 02/05/2015 1157   K 3.8 02/01/2015 0444   CL 114* 02/01/2015 0444   CO2 20* 02/05/2015 1157   CO2 21* 02/01/2015 0444   BUN 28.5* 02/05/2015 1157   BUN 29* 02/01/2015 0444   CREATININE 1.0 02/05/2015 1157   CREATININE 1.03 02/01/2015 0444      Component Value  Date/Time   CALCIUM 8.6 02/05/2015 1157   CALCIUM 8.2* 02/01/2015 0444   ALKPHOS 71 02/05/2015 1157   ALKPHOS 56 02/01/2015 0444   AST 34 02/05/2015 1157   AST 46* 02/01/2015 0444   ALT 101* 02/05/2015 1157   ALT 88* 02/01/2015 0444   BILITOT 0.54 02/05/2015 1157   BILITOT 0.8 02/01/2015 0444       RADIOGRAPHIC STUDIES: I reviewed the imaging study with the patient and his daughter I have personally reviewed the radiological images as listed and agreed with the findings in the report.   ASSESSMENT & PLAN:   I review his case at the hematology tumor board. Mantle cell lymphoma I have a long discussion with the patient and his daughter. I reviewed his case at the hematology tumor board today. Over consensus would be to proceed with similar chemotherapy for several cycles. The patient would benefit from further maintenance treatment in the future after he has completed current regimen. I discussed with him and his daughter about the use of Revlimid, Velcade and dexamethasone in the future.  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 The patient denies recent history of bleeding such as epistaxis, hematuria or hematochezia. He is asymptomatic from the anemia. We will observe for now.  He does not require transfusion now.      Essential hypertension  He has started to become hypertensive after I discontinue hydrochlorothiazide. He is symptomatic and I plan to observe for now.   Thrombocytopenia This is due to splenomegaly and relapsed disease. I recommend he hold aspirin. The patient denies any recent signs or symptoms of bleeding such as spontaneous epistaxis, hematuria or hematochezia.    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 I plan to reduce the dose of vincristine in the future.     Orders Placed This Encounter  Procedures  . Hold Tube, Blood Bank    Standing  Status: Standing     Number of Occurrences: 22     Standing Expiration Date: 02/05/2016   All questions were answered. The patient knows to call the clinic with any problems, questions or concerns. No barriers to learning was detected. I spent 25 minutes counseling the patient face to face. The total time spent in the appointment was 30 minutes and more than 50% was on counseling and review of test results     [facility], [place], MD 02/05/2015 12:34 PM

## 2015-02-07 ENCOUNTER — Telehealth: Payer: Self-pay | Admitting: *Deleted

## 2015-02-07 NOTE — Telephone Encounter (Signed)
Please ask him to take pictures of the bump. Could be due to the steroids. Nothing needs to be done He needs to take anti-emetics round the clock

## 2015-02-07 NOTE — Telephone Encounter (Signed)
Called patient family member back and verbalized understanding.

## 2015-02-07 NOTE — Telephone Encounter (Signed)
Family member called stating that on 02/06/15, patient began to feel very fatigued with no complaints of diarrhea/vomiting. Patient began to notice "pimple" like bumps on head, forehead, and pain around his jaw-line. Today, 01/08/15 patient is feeling less fatigued but is nauseated and the "pimple" like bumps are still there. Family member states patient has recently received chemotherapy in the hospital and stopped taking Revlimid on 01/24/15. Message sent to MD Gorsuch/RN Cameo.

## 2015-02-10 ENCOUNTER — Encounter (HOSPITAL_COMMUNITY): Payer: Self-pay | Admitting: Emergency Medicine

## 2015-02-10 ENCOUNTER — Inpatient Hospital Stay (HOSPITAL_COMMUNITY)
Admission: EM | Admit: 2015-02-10 | Discharge: 2015-02-14 | DRG: 809 | Disposition: A | Discharge status: Home / Self Care | Payer: Medicare PPO | Attending: Internal Medicine | Admitting: Internal Medicine

## 2015-02-10 ENCOUNTER — Emergency Department (HOSPITAL_COMMUNITY): Payer: Medicare PPO

## 2015-02-10 DIAGNOSIS — R5081 Fever presenting with conditions classified elsewhere: Secondary | ICD-10-CM | POA: Diagnosis present

## 2015-02-10 DIAGNOSIS — D709 Neutropenia, unspecified: Principal | ICD-10-CM

## 2015-02-10 DIAGNOSIS — D630 Anemia in neoplastic disease: Secondary | ICD-10-CM | POA: Diagnosis present

## 2015-02-10 DIAGNOSIS — E039 Hypothyroidism, unspecified: Secondary | ICD-10-CM | POA: Diagnosis present

## 2015-02-10 DIAGNOSIS — D6481 Anemia due to antineoplastic chemotherapy: Secondary | ICD-10-CM | POA: Diagnosis not present

## 2015-02-10 DIAGNOSIS — F329 Major depressive disorder, single episode, unspecified: Secondary | ICD-10-CM | POA: Diagnosis present

## 2015-02-10 DIAGNOSIS — M549 Dorsalgia, unspecified: Secondary | ICD-10-CM | POA: Diagnosis present

## 2015-02-10 DIAGNOSIS — E440 Moderate protein-calorie malnutrition: Secondary | ICD-10-CM | POA: Insufficient documentation

## 2015-02-10 DIAGNOSIS — G629 Polyneuropathy, unspecified: Secondary | ICD-10-CM | POA: Diagnosis present

## 2015-02-10 DIAGNOSIS — I10 Essential (primary) hypertension: Secondary | ICD-10-CM | POA: Diagnosis present

## 2015-02-10 DIAGNOSIS — M109 Gout, unspecified: Secondary | ICD-10-CM | POA: Diagnosis present

## 2015-02-10 DIAGNOSIS — D6959 Other secondary thrombocytopenia: Secondary | ICD-10-CM | POA: Diagnosis present

## 2015-02-10 DIAGNOSIS — R161 Splenomegaly, not elsewhere classified: Secondary | ICD-10-CM | POA: Diagnosis present

## 2015-02-10 DIAGNOSIS — E785 Hyperlipidemia, unspecified: Secondary | ICD-10-CM | POA: Diagnosis present

## 2015-02-10 DIAGNOSIS — T451X5A Adverse effect of antineoplastic and immunosuppressive drugs, initial encounter: Secondary | ICD-10-CM | POA: Diagnosis present

## 2015-02-10 DIAGNOSIS — Z87891 Personal history of nicotine dependence: Secondary | ICD-10-CM

## 2015-02-10 DIAGNOSIS — C8310 Mantle cell lymphoma, unspecified site: Secondary | ICD-10-CM

## 2015-02-10 DIAGNOSIS — D696 Thrombocytopenia, unspecified: Secondary | ICD-10-CM | POA: Diagnosis not present

## 2015-02-10 DIAGNOSIS — R197 Diarrhea, unspecified: Secondary | ICD-10-CM | POA: Diagnosis not present

## 2015-02-10 DIAGNOSIS — R509 Fever, unspecified: Secondary | ICD-10-CM | POA: Diagnosis not present

## 2015-02-10 DIAGNOSIS — Z6825 Body mass index (BMI) 25.0-25.9, adult: Secondary | ICD-10-CM | POA: Diagnosis not present

## 2015-02-10 LAB — URINALYSIS, ROUTINE W REFLEX MICROSCOPIC
BILIRUBIN URINE: NEGATIVE
GLUCOSE, UA: NEGATIVE mg/dL
HGB URINE DIPSTICK: NEGATIVE
Ketones, ur: NEGATIVE mg/dL
Leukocytes, UA: NEGATIVE
Nitrite: NEGATIVE
PROTEIN: NEGATIVE mg/dL
Specific Gravity, Urine: 1.015 (ref 1.005–1.030)
UROBILINOGEN UA: 0.2 mg/dL (ref 0.0–1.0)
pH: 7.5 (ref 5.0–8.0)

## 2015-02-10 LAB — I-STAT CG4 LACTIC ACID, ED: Lactic Acid, Venous: 2.18 mmol/L (ref 0.5–2.0)

## 2015-02-10 LAB — CBC WITH DIFFERENTIAL/PLATELET
BASOS PCT: 2 % — AB (ref 0–1)
Basophils Absolute: 0 10*3/uL (ref 0.0–0.1)
EOS PCT: 26 % — AB (ref 0–5)
Eosinophils Absolute: 0.1 10*3/uL (ref 0.0–0.7)
HCT: 25.5 % — ABNORMAL LOW (ref 39.0–52.0)
HEMOGLOBIN: 8.6 g/dL — AB (ref 13.0–17.0)
LYMPHS ABS: 0.2 10*3/uL — AB (ref 0.7–4.0)
Lymphocytes Relative: 53 % — ABNORMAL HIGH (ref 12–46)
MCH: 30.6 pg (ref 26.0–34.0)
MCHC: 33.7 g/dL (ref 30.0–36.0)
MCV: 90.7 fL (ref 78.0–100.0)
MONO ABS: 0.1 10*3/uL (ref 0.1–1.0)
Monocytes Relative: 14 % — ABNORMAL HIGH (ref 3–12)
NEUTROS ABS: 0 10*3/uL — AB (ref 1.7–7.7)
Neutrophils Relative %: 5 % — ABNORMAL LOW (ref 43–77)
Platelets: 22 10*3/uL — CL (ref 150–400)
RBC: 2.81 MIL/uL — ABNORMAL LOW (ref 4.22–5.81)
RDW: 14.6 % (ref 11.5–15.5)
WBC: 0.4 10*3/uL — CL (ref 4.0–10.5)

## 2015-02-10 LAB — COMPREHENSIVE METABOLIC PANEL
ALK PHOS: 76 U/L (ref 38–126)
ALT: 43 U/L (ref 17–63)
AST: 16 U/L (ref 15–41)
Albumin: 3 g/dL — ABNORMAL LOW (ref 3.5–5.0)
Anion gap: 8 (ref 5–15)
BILIRUBIN TOTAL: 0.7 mg/dL (ref 0.3–1.2)
BUN: 19 mg/dL (ref 6–20)
CHLORIDE: 104 mmol/L (ref 101–111)
CO2: 23 mmol/L (ref 22–32)
Calcium: 7.9 mg/dL — ABNORMAL LOW (ref 8.9–10.3)
Creatinine, Ser: 1.23 mg/dL (ref 0.61–1.24)
GFR calc Af Amer: >60 mL/min (ref >60)
GFR, EST NON AFRICAN AMERICAN: 56 mL/min — AB (ref >60)
GLUCOSE: 133 mg/dL — AB (ref 65–99)
POTASSIUM: 3.9 mmol/L (ref 3.5–5.1)
SODIUM: 135 mmol/L (ref 135–145)
Total Protein: 5.7 g/dL — ABNORMAL LOW (ref 6.5–8.1)

## 2015-02-10 LAB — PROTIME-INR
INR: 1.1 (ref 0.00–1.49)
PROTHROMBIN TIME: 14.4 s (ref 11.6–15.2)

## 2015-02-10 LAB — LIPASE, BLOOD: LIPASE: 12 U/L — AB (ref 22–51)

## 2015-02-10 MED ORDER — IMIPENEM-CILASTATIN 500 MG IV SOLR
500.0000 mg | Freq: Three times a day (TID) | INTRAVENOUS | Status: DC
  Filled 2015-02-10 (×2): qty 500

## 2015-02-10 MED ORDER — ALBUTEROL SULFATE (2.5 MG/3ML) 0.083% IN NEBU: 2.5000 mg | INHALATION_SOLUTION | RESPIRATORY_TRACT | Status: DC | PRN

## 2015-02-10 MED ORDER — LORAZEPAM 0.5 MG PO TABS: 0.5000 mg | ORAL_TABLET | Freq: Four times a day (QID) | ORAL | Status: DC | PRN

## 2015-02-10 MED ORDER — DEXAMETHASONE 4 MG PO TABS: 8.0000 mg | ORAL_TABLET | Freq: Two times a day (BID) | ORAL | Status: DC

## 2015-02-10 MED ORDER — SODIUM CHLORIDE 0.9 % IV SOLN
INTRAVENOUS | Status: DC
  Administered 2015-02-11 – 2015-02-13 (×5): via INTRAVENOUS

## 2015-02-10 MED ORDER — MORPHINE SULFATE 2 MG/ML IJ SOLN: 1.0000 mg | INTRAMUSCULAR | Status: DC | PRN

## 2015-02-10 MED ORDER — SODIUM CHLORIDE 0.9 % IJ SOLN
3.0000 mL | Freq: Two times a day (BID) | INTRAMUSCULAR | Status: DC
  Administered 2015-02-10 – 2015-02-13 (×2): 3 mL via INTRAVENOUS

## 2015-02-10 MED ORDER — VANCOMYCIN HCL IN DEXTROSE 1-5 GM/200ML-% IV SOLN
1000.0000 mg | Freq: Two times a day (BID) | INTRAVENOUS | Status: DC
  Filled 2015-02-10: qty 200

## 2015-02-10 MED ORDER — DEXTROSE 5 % IV SOLN
2.0000 g | INTRAVENOUS | Status: AC
  Administered 2015-02-10: 2 g via INTRAVENOUS
  Filled 2015-02-10: qty 2

## 2015-02-10 MED ORDER — IBUPROFEN 800 MG PO TABS
800.0000 mg | ORAL_TABLET | Freq: Once | ORAL | Status: AC
  Administered 2015-02-10: 800 mg via ORAL
  Filled 2015-02-10: qty 1

## 2015-02-10 MED ORDER — SODIUM CHLORIDE 0.9 % IV BOLUS (SEPSIS)
1000.0000 mL | INTRAVENOUS | Status: AC
  Administered 2015-02-10: 1000 mL via INTRAVENOUS

## 2015-02-10 MED ORDER — ACETAMINOPHEN 325 MG PO TABS: 650.0000 mg | ORAL_TABLET | Freq: Four times a day (QID) | ORAL | Status: DC | PRN

## 2015-02-10 MED ORDER — ZOLPIDEM TARTRATE 5 MG PO TABS: 5.0000 mg | ORAL_TABLET | Freq: Every evening | ORAL | Status: DC | PRN

## 2015-02-10 MED ORDER — DEXTROSE 5 % IV SOLN
2.0000 g | Freq: Three times a day (TID) | INTRAVENOUS | Status: DC
  Administered 2015-02-11 – 2015-02-14 (×10): 2 g via INTRAVENOUS
  Filled 2015-02-10 (×12): qty 2

## 2015-02-10 MED ORDER — ONDANSETRON HCL 8 MG PO TABS: 8.0000 mg | ORAL_TABLET | Freq: Two times a day (BID) | ORAL | Status: DC

## 2015-02-10 MED ORDER — VANCOMYCIN HCL 10 G IV SOLR
1500.0000 mg | Freq: Once | INTRAVENOUS | Status: AC
  Administered 2015-02-10: 1500 mg via INTRAVENOUS
  Filled 2015-02-10: qty 1500

## 2015-02-10 MED ORDER — ACETAMINOPHEN 650 MG RE SUPP: 650.0000 mg | Freq: Four times a day (QID) | RECTAL | Status: DC | PRN

## 2015-02-10 MED ORDER — LEVOTHYROXINE SODIUM 25 MCG PO TABS
75.0000 ug | ORAL_TABLET | Freq: Every day | ORAL | Status: DC
  Administered 2015-02-11 – 2015-02-14 (×4): 75 ug via ORAL
  Filled 2015-02-10 (×4): qty 3

## 2015-02-10 MED ORDER — ONDANSETRON HCL 4 MG/2ML IJ SOLN: 4.0000 mg | Freq: Four times a day (QID) | INTRAMUSCULAR | Status: DC | PRN

## 2015-02-10 MED ORDER — ALBUTEROL SULFATE (2.5 MG/3ML) 0.083% IN NEBU
2.5000 mg | INHALATION_SOLUTION | Freq: Four times a day (QID) | RESPIRATORY_TRACT | Status: DC
  Administered 2015-02-11: 2.5 mg via RESPIRATORY_TRACT
  Filled 2015-02-10: qty 3

## 2015-02-10 MED ORDER — DEXTROSE 5 % IV SOLN
2.0000 g | Freq: Three times a day (TID) | INTRAVENOUS | Status: DC
  Filled 2015-02-10: qty 2

## 2015-02-10 MED ORDER — HYDROCODONE-ACETAMINOPHEN 5-325 MG PO TABS
1.0000 | ORAL_TABLET | ORAL | Status: DC | PRN
  Administered 2015-02-13: 1 via ORAL
  Filled 2015-02-10: qty 1

## 2015-02-10 MED ORDER — IPRATROPIUM BROMIDE 0.02 % IN SOLN
0.5000 mg | Freq: Four times a day (QID) | RESPIRATORY_TRACT | Status: DC
  Administered 2015-02-11: 0.5 mg via RESPIRATORY_TRACT
  Filled 2015-02-10: qty 2.5

## 2015-02-10 MED ORDER — ONDANSETRON HCL 4 MG PO TABS: 4.0000 mg | ORAL_TABLET | Freq: Four times a day (QID) | ORAL | Status: DC | PRN

## 2015-02-10 NOTE — Progress Notes (Addendum)
ANTIBIOTIC CONSULT NOTE - INITIAL  Pharmacy Consult for Vancomycin, Cefepime Indication: Febrile neutropenia  No Known Allergies  Patient Measurements:   Adjusted Body Weight:   Vital Signs: Temp: 101 F (38.3 C) (07/10 2102) Temp Source: Oral (07/10 2102) BP: 135/72 mmHg (07/10 2200) Pulse Rate: 101 (07/10 2115) Intake/Output from previous day:   Intake/Output from this shift: Total I/O In: -  Out: 450 [Urine:450]  Labs:  Recent Labs  02/10/15 1843  WBC 0.4*  HGB 8.6*  PLT 22*  CREATININE 1.23   Estimated Creatinine Clearance: 63.9 mL/min (by C-G formula based on Cr of 1.23). No results for input(s): VANCOTROUGH, VANCOPEAK, VANCORANDOM, GENTTROUGH, GENTPEAK, GENTRANDOM, TOBRATROUGH, TOBRAPEAK, TOBRARND, AMIKACINPEAK, AMIKACINTROU, AMIKACIN in the last 72 hours.   Microbiology: Recent Results (from the past 720 hour(s))  TECHNOLOGIST REVIEW     Status: None   Collection Time: 01/24/15  8:55 AM  Result Value Ref Range Status   Technologist Review variant lymph, many oval, few acanthocytes  Final    Medical History: Past Medical History  Diagnosis Date  . Thyroid disease   . Hyperlipemia   . Liver disease   . Malnutrition 08/30/2013  . Hypothyroidism   . Depression     Spouse passed 5 months ago  . Gout 09/28/2013  . Fatigue 08/31/2014  . Essential hypertension 10/25/2014  . 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    Medications:  Anti-infectives    Start     Dose/Rate Route Frequency Ordered Stop   02/11/15 1000  vancomycin (VANCOCIN) IVPB 1000 mg/200 mL premix     1,000 mg 200 mL/hr over 60 Minutes Intravenous Every 12 hours 02/10/15 2311     02/11/15 0600  ceFEPIme (MAXIPIME) 2 g in dextrose 5 % 50 mL IVPB  Status:  Discontinued     2 g 100 mL/hr over 30 Minutes Intravenous 3 times per day 02/10/15 2038 02/10/15 2310   02/10/15 2315  imipenem-cilastatin (PRIMAXIN) 500 mg in sodium chloride 0.9 % 100 mL IVPB     500  mg 200 mL/hr over 30 Minutes Intravenous 3 times per day 02/10/15 2311     02/10/15 2130  vancomycin (VANCOCIN) 1,500 mg in sodium chloride 0.9 % 500 mL IVPB     1,500 mg 250 mL/hr over 120 Minutes Intravenous  Once 02/10/15 2033     02/10/15 2045  ceFEPIme (MAXIPIME) 2 g in dextrose 5 % 50 mL IVPB     2 g 100 mL/hr over 30 Minutes Intravenous STAT 02/10/15 2037 02/10/15 2128     Assessment: MD wishes for cefepime, vancomycin for febrile neutropenia.  Primaxin per pharmacy ordered, but spoke with MD and he preferred to have cefepime.  First dose of antibiotics already given.  Pharmacy was already dosing antibiotics for patient.  Goal of Therapy:  Vancomycin trough level 15-20 mcg/ml  Cefepime dosed based on patient weight and renal function   Plan:  Measure antibiotic drug levels at steady state Follow up culture results Vancomycin 1gm iv q12hr  Cefepime 2gm iv q8hr  Tyler Deis, Shea Stakes [place] 02/10/2015,11:11 PM

## 2015-02-10 NOTE — ED Notes (Signed)
Pt with Hx of lymphoma, last chemo treatment last week of June, c/o diarrhe onset Friday night, fever, abdominal pain, and nausea onset Wednesday. Fever is 101.8 45 minutes after taking two tylenol pills, dosage unknown.

## 2015-02-10 NOTE — ED Notes (Signed)
Pt given an urinal and made aware of need for urine specimen. Pt states he can not void at this time.

## 2015-02-10 NOTE — Progress Notes (Addendum)
ANTIBIOTIC CONSULT NOTE - INITIAL  Pharmacy Consult for Cefepime Indication: Febrile Neutropenia  No Known Allergies  Patient Measurements:   Height: 6'3" Weight: 92.9 kg (as of 02/05/15)  Vital Signs: Temp: 101.8 F (38.8 C) (07/10 1824) Temp Source: Oral (07/10 1824) BP: 137/66 mmHg (07/10 2030) Pulse Rate: 94 (07/10 2030) Intake/Output from previous day:   Intake/Output from this shift:    Labs:  Recent Labs  02/10/15 1843  WBC 0.4*  HGB 8.6*  PLT 22*  CREATININE 1.23   Estimated Creatinine Clearance: 63.9 mL/min (by C-G formula based on Cr of 1.23). No results for input(s): VANCOTROUGH, VANCOPEAK, VANCORANDOM, GENTTROUGH, GENTPEAK, GENTRANDOM, TOBRATROUGH, TOBRAPEAK, TOBRARND, AMIKACINPEAK, AMIKACINTROU, AMIKACIN in the last 72 hours.   Microbiology: Recent Results (from the past 720 hour(s))  TECHNOLOGIST REVIEW     Status: None   Collection Time: 01/24/15  8:55 AM  Result Value Ref Range Status   Technologist Review variant lymph, many oval, few acanthocytes  Final    Medical History: Past Medical History  Diagnosis Date  . Thyroid disease   . Hyperlipemia   . Liver disease   . Malnutrition 08/30/2013  . Hypothyroidism   . Depression     Spouse passed 5 months ago  . Gout 09/28/2013  . Fatigue 08/31/2014  . Essential hypertension 10/25/2014  . 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 Patient found to have neutropenia with WBC of 0.4, ANC of 0. Pharmacy consulted to dose Cefepime for febrile neutropenia.  7/10 >> Cefepime >> 7/10 >> Vancomycin x 1    7/10 blood x 2: sent 7/10 urine: ordered   Temp: 101.66F Renal: SCr 1.23, CrCl ~ 64 ml/min CG  Goal of Therapy:  Appropriate antibiotic dosing for renal function and indication Eradication of infection  Plan:   Cefepime 2g IV  q8h  Monitor renal function, cultures, clinical course.   Lindell Spar, PharmD, BCPS Pager: [phone] 02/10/2015 8:56 PM

## 2015-02-10 NOTE — ED Provider Notes (Signed)
CSN: [id number]     Arrival date & time 02/10/15  1814 History   First MD Initiated Contact with Patient 02/10/15 1839     Chief Complaint  Patient presents with  . Fever  . Emesis     (Consider location/radiation/quality/duration/timing/severity/associated sxs/prior Treatment) Patient is a 74 y.o. male presenting with fever and vomiting. The history is provided by the patient.  Fever Temp source:  Oral Severity:  Moderate Onset quality:  Sudden Timing:  Constant Progression:  Unchanged Chronicity:  New Relieved by:  Nothing Worsened by:  Nothing tried Ineffective treatments:  None tried Associated symptoms: diarrhea   Associated symptoms: no chest pain, no cough, no dysuria, no headaches, no nausea, no rhinorrhea and no vomiting   Diarrhea:    Quality:  Watery   Severity:  Mild   Duration:  1 week   Timing:  Intermittent   Progression:  Unchanged Risk factors: hx of cancer   Emesis Associated symptoms: diarrhea   Associated symptoms: no abdominal pain and no headaches     Past Medical History  Diagnosis Date  . Thyroid disease   . Hyperlipemia   . Liver disease   . Malnutrition 08/30/2013  . Hypothyroidism   . Depression     Spouse passed 5 months ago  . Gout 09/28/2013  . Fatigue 08/31/2014  . Essential hypertension 10/25/2014  . 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 Back surgery      1985 and 1986  . Axillary lymph node biopsy Right 09/12/2013    Procedure: RIGHT AXILLARY LYMPH NODE BIOPSY;  Surgeon: Marcello Moores A. Cornett, MD;  Location: [facility];  Service: General;  Laterality: Right;  . Portacath placement Right 09/12/2013    Procedure: INSERTION PORT-A-CATH;  Surgeon: Joyice Faster. Cornett, MD;  Location: [facility];  Service: General;  Laterality: Right;   Family History  Problem Relation Age of Onset  . Cancer Mother     breast ca  . Cancer  Sister     NHL  . Cancer Brother     ?colon can  . Cancer Brother     lung ca   History  Substance Use Topics  . Smoking status: Former Smoker -- 0.25 packs/day for 52 years    Types: Cigarettes  . Smokeless tobacco: Never Used  . Alcohol Use: No    Review of Systems  Constitutional: Positive for fever.  HENT: Negative for drooling and rhinorrhea.   Eyes: Negative for pain.  Respiratory: Negative for cough and shortness of breath.   Cardiovascular: Negative for chest pain and leg swelling.  Gastrointestinal: Positive for diarrhea. Negative for nausea, vomiting and abdominal pain.  Genitourinary: Negative for dysuria and hematuria.  Musculoskeletal: Negative for gait problem and neck pain.  Skin: Negative for color change.  Neurological: Negative for numbness and headaches.  Hematological: Negative for adenopathy.  Psychiatric/Behavioral: Negative for behavioral problems.  All other systems reviewed and are negative.     Allergies  Review of patient's allergies indicates no known allergies.  Home Medications   Prior to Admission medications   Medication Sig Start Date End Date Taking? Authorizing Provider  acetaminophen (TYLENOL) 500 MG tablet Take 500 mg by mouth every 6 (six) hours as needed for moderate pain or fever.     Historical Provider, MD  acyclovir (ZOVIRAX) 400 MG tablet Take 1 tablet (400 mg total) by mouth daily. 09/28/14  Heath Lark, MD  allopurinol (ZYLOPRIM) 300 MG tablet Take 1 tablet (300 mg total) by mouth daily. Patient not taking: Reported on 02/05/2015 01/24/15   Heath Lark, MD  cholecalciferol (VITAMIN D) 1000 UNITS tablet Take 2,000 Units by mouth every morning.    Historical Provider, MD  dexamethasone (DECADRON) 4 MG tablet Take 2 tablets (8 mg total) by mouth 2 (two) times daily with a meal. Take two times a day starting the day after chemotherapy for 3 days. Patient not taking: Reported on 02/05/2015 02/01/15   Heath Lark, MD  levothyroxine  (SYNTHROID, LEVOTHROID) 75 MCG tablet TAKE 1 TABLET EVERY DAY Patient taking differently: TAKE 75 MCG BY MOUTH DAILY 09/28/14   Heath Lark, MD  loratadine (CLARITIN) 10 MG tablet Take 10 mg by mouth every morning.    Historical Provider, MD  LORazepam (ATIVAN) 0.5 MG tablet Take 1 tablet (0.5 mg total) by mouth every 6 (six) hours as needed (Nausea or vomiting). Patient not taking: Reported on 02/05/2015 02/01/15   Heath Lark, MD  Multiple Vitamin tablet Take 1 tablet by mouth daily.  04/27/14   Historical Provider, MD  ondansetron (ZOFRAN) 8 MG tablet Take 1 tablet (8 mg total) by mouth 2 (two) times daily. Take two times a day starting the day after chemo for 3 days. Then take two times a day as needed for nausea or vomiting. 02/01/15   Heath Lark, MD  Probiotic Product (PROBIOTIC PO) Take 1 tablet by mouth daily.    Historical Provider, MD  prochlorperazine (COMPAZINE) 10 MG tablet Take 1 tablet (10 mg total) by mouth every 6 (six) hours as needed (Nausea or vomiting). Patient not taking: Reported on 02/05/2015 02/01/15   Heath Lark, MD  prochlorperazine (COMPAZINE) 25 MG suppository Place 1 suppository (25 mg total) rectally every 12 (twelve) hours as needed for nausea. Patient not taking: Reported on 02/05/2015 02/01/15   Ni Gorsuch, MD   BP 120/67 mmHg  Pulse 99  Temp(Src) 101.8 F (38.8 C) (Oral)  Resp 18  SpO2 99% Physical Exam  Constitutional: He is oriented to person, place, and time. He appears well-developed and well-nourished.  HENT:  Head: Normocephalic and atraumatic.  Right Ear: External ear normal.  Left Ear: External ear normal.  Nose: Nose normal.  Mouth/Throat: Oropharynx is clear and moist. No oropharyngeal exudate.  Eyes: Conjunctivae and EOM are normal. Pupils are equal, round, and reactive to light.  Neck: Normal range of motion. Neck supple.  Cardiovascular: Normal rate, regular rhythm, normal heart sounds and intact distal pulses.  Exam reveals no gallop and no friction rub.    No murmur heard. Pulmonary/Chest: Effort normal and breath sounds normal. No respiratory distress. He has no wheezes.  Abdominal: Soft. Bowel sounds are normal. He exhibits no distension. There is no tenderness. There is no rebound and no guarding.  Musculoskeletal: Normal range of motion. He exhibits no edema or tenderness.  Neurological: He is alert and oriented to person, place, and time.  Skin: Skin is warm and dry.  Psychiatric: He has a normal mood and affect. His behavior is normal.  Nursing note and vitals reviewed.   ED Course  Procedures (including critical care time) Labs Review Labs Reviewed  CBC WITH DIFFERENTIAL/PLATELET - Abnormal; Notable for the following:    WBC 0.4 (*)    RBC 2.81 (*)    Hemoglobin 8.6 (*)    HCT 25.5 (*)    Platelets 22 (*)    Neutrophils Relative % 5 (*)  Lymphocytes Relative 53 (*)    Monocytes Relative 14 (*)    Eosinophils Relative 26 (*)    Basophils Relative 2 (*)    Neutro Abs 0.0 (*)    Lymphs Abs 0.2 (*)    All other components within normal limits  URINALYSIS, ROUTINE W REFLEX MICROSCOPIC (NOT AT [facility]) - Abnormal; Notable for the following:    APPearance CLOUDY (*)    All other components within normal limits  COMPREHENSIVE METABOLIC PANEL - Abnormal; Notable for the following:    Glucose, Bld 133 (*)    Calcium 7.9 (*)    Total Protein 5.7 (*)    Albumin 3.0 (*)    GFR calc non Af Amer 56 (*)    All other components within normal limits  LIPASE, BLOOD - Abnormal; Notable for the following:    Lipase 12 (*)    All other components within normal limits  I-STAT CG4 LACTIC ACID, ED - Abnormal; Notable for the following:    Lactic Acid, Venous 2.18 (*)    All other components within normal limits  CULTURE, BLOOD (ROUTINE X 2)  CULTURE, BLOOD (ROUTINE X 2)  URINE CULTURE  PROTIME-INR  CBC  BASIC METABOLIC PANEL  I-STAT CG4 LACTIC ACID, ED    Imaging Review Dg Chest 2 View  02/10/2015   CLINICAL DATA:  74 year old  male with lymphoma and fever.  EXAM: CHEST  2 VIEW  COMPARISON:  CT dated 01/25/2015 and chest radiograph dated 08/19/2014  FINDINGS: Right pectoral Port-A-Cath with tip over central SVC. Two views of the chest demonstrate mild emphysematous changes of the lungs. No focal consolidation, pleural effusion, or pneumothorax. The cardiomediastinal silhouette is within normal limits. The osteopenia degenerative changes of the spine.  IMPRESSION: No focal consolidation.   Electronically Signed   By: Anner Crete M.D.   On: 02/10/2015 19:37     EKG Interpretation   Date/Time:  Sunday February 10 2015 18:31:35 EDT Ventricular Rate:  98 PR Interval:  148 QRS Duration: 95 QT Interval:  363 QTC Calculation: 463 R Axis:   -36 Text Interpretation:  Sinus rhythm Left axis deviation Abnormal R-wave  progression, early transition Baseline wander in lead(s) V4 Confirmed by  Ronak Duquette  MD, Lavinia Mcneely ([id number]) on 02/10/2015 6:46:51 PM      MDM   Final diagnoses:  Neutropenic fever    7:10 PM 74 y.o. 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 He states that he has had intermittent watery diarrhea for the last week. He was found to have a fever today. He states that his daughter insisted he come to the hospital. He denies any nausea, vomiting, chest pain, shortness of breath, abdominal pain, Brother associated symptoms. He is alert and oriented 3 year. He is febrile but vital signs are otherwise unremarkable and he has a mildly elevated lactic acid. We'll give a bolus of IV fluid and get screening lab work and imaging.  Found to be neutropenic. Will cover w/ Vanc/cefepime. Discussed w/ pharmacy. Hospitalist to admit.    Pamella Pert, MD 02/10/15 2321

## 2015-02-10 NOTE — H&P (Signed)
[facility]                                                                                    Patient Demographics  Manuel [name], is a 74 y.o. male  CSN: [id number]  MRN: [id number]  DOB - [birth year]  Admit Date - 02/10/2015  Outpatient Primary MD for the patient is Pcp Not In System   With History of -  Past Medical History  Diagnosis Date  . Thyroid disease   . Hyperlipemia   . Liver disease   . Malnutrition 08/30/2013  . Hypothyroidism   . Depression     Spouse passed 5 months ago  . Gout 09/28/2013  . Fatigue 08/31/2014  . Essential hypertension 10/25/2014  . 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 Back surgery      1985 and 1986  . Axillary lymph node biopsy Right 09/12/2013    Procedure: RIGHT AXILLARY LYMPH NODE BIOPSY;  Surgeon: Marcello Moores A. Cornett, MD;  Location: [place];  Service: General;  Laterality: Right;  . Portacath placement Right 09/12/2013    Procedure: INSERTION PORT-A-CATH;  Surgeon: Joyice Faster. Cornett, MD;  Location: [place];  Service: General;  Laterality: Right;    in for   Chief Complaint  Patient presents with  . Fever  . Emesis     HPI  Manuel [name]  is a 74 y.o. 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 The patient had good breakfast today and his power of attorney, his daughter noted a fever of 102.2 and was advised to come to the emergency room. Patient denies any shortness of breath cough or urinary symptoms. He is sitting in bed with shaking chills. Denies any headaches or loss of consciousness . In the emergency room he was noted to be febrile with a white blood cell count of 0.4.     Review of  Systems    In addition to the HPI above, No Headache, No changes with Vision or hearing, No problems swallowing food or Liquids, No Chest pain, Cough or Shortness of Breath, No Nausea or Vommitting,  No Blood in stool or Urine, No dysuria, No new skin rashes or bruises, No new joints pains-aches,  No new weakness, tingling, numbness in any extremity, No recent weight gain or loss, No polyuria, polydypsia or polyphagia, No significant Mental Stressors.  A full 10 point Review of Systems was done, except as stated above, all other Review of Systems were negative.   Social History History  Substance Use Topics  . Smoking status: Former Smoker -- 0.25 packs/day for 52 years    Types: Cigarettes  . Smokeless tobacco: Never Used  . Alcohol Use: No     Family History Family History  Problem Relation Age of Onset  . Cancer Mother     breast ca  . Cancer Sister  NHL  . Cancer Brother     ?colon can  . Cancer Brother     lung ca     Prior to Admission medications   Medication Sig Start Date End Date Taking? Authorizing Provider  acetaminophen (TYLENOL) 500 MG tablet Take 500 mg by mouth every 6 (six) hours as needed for moderate pain or fever.    Yes Historical Provider, MD  cholecalciferol (VITAMIN D) 1000 UNITS tablet Take 2,000 Units by mouth every morning.   Yes Historical Provider, MD  levothyroxine (SYNTHROID, LEVOTHROID) 75 MCG tablet TAKE 1 TABLET EVERY DAY Patient taking differently: TAKE 75 MCG BY MOUTH DAILY 09/28/14  Yes Heath Lark, MD  loratadine (CLARITIN) 10 MG tablet Take 10 mg by mouth every morning.   Yes Historical Provider, MD  Multiple Vitamin tablet Take 1 tablet by mouth daily.  04/27/14  Yes Historical Provider, MD  Probiotic Product (PROBIOTIC PO) Take 1 tablet by mouth daily.   Yes Historical Provider, MD  allopurinol (ZYLOPRIM) 300 MG tablet Take 1 tablet (300 mg total) by mouth daily. Patient not taking: Reported on 02/05/2015 01/24/15   Heath Lark, MD  dexamethasone (DECADRON) 4 MG tablet Take 2 tablets (8 mg total) by mouth 2 (two) times daily with a meal. Take two times a day starting the day after chemotherapy for 3 days. Patient not taking: Reported on 02/05/2015 02/01/15   Heath Lark, MD  LORazepam (ATIVAN) 0.5 MG tablet Take 1 tablet (0.5 mg total) by mouth every 6 (six) hours as needed (Nausea or vomiting). Patient not taking: Reported on 02/05/2015 02/01/15   Heath Lark, MD  ondansetron (ZOFRAN) 8 MG tablet Take 1 tablet (8 mg total) by mouth 2 (two) times daily. Take two times a day starting the day after chemo for 3 days. Then take two times a day as needed for nausea or vomiting. Patient not taking: Reported on 02/10/2015 02/01/15   Heath Lark, MD  prochlorperazine (COMPAZINE) 10 MG tablet Take 1 tablet (10 mg total) by mouth every 6 (six) hours as needed (Nausea or vomiting). Patient not taking: Reported on 02/05/2015 02/01/15   Heath Lark, MD  prochlorperazine (COMPAZINE) 25 MG suppository Place 1 suppository (25 mg total) rectally every 12 (twelve) hours as needed for nausea. Patient not taking: Reported on 02/05/2015 02/01/15   Heath Lark, MD    No Known Allergies  Physical Exam  Vitals  Blood pressure 129/51, pulse 101, temperature 101 F (38.3 C), temperature source Oral, resp. rate 22, SpO2 99 %.   1. General elderly male, extremely pleasant, shaking chills  2.  Not Suicidal or Homicidal, Awake Alert, Oriented X 3.  3. No F.N deficits, grossly, ALL C.Nerves Intact,  4. Ears and Eyes appear Normal, Conjunctivae clear, PERRLA. Moist Oral Mucosa.  5. Supple Neck, No JVD, No cervical lymphadenopathy appriciated, No Carotid Bruits.  6. Symmetrical Chest wall movement, Good air movement bilaterally, CTAB.  7. RRR, tachycardic, No Gallops, Rubs or Murmurs, No Parasternal Heave.  8. Positive Bowel Sounds, Abdomen Soft, Non tender, No organomegaly appriciated,No rebound -guarding or rigidity.  9.  No Cyanosis, Normal Skin  Turgor, No Skin Rash or Bruise.  10. Good muscle tone,  joints appear normal , no effusions, Normal ROM.  11. No Palpable Lymph Nodes in Neck or Axillae    Data Review  CBC  Recent Labs Lab 02/05/15 1157 02/10/15 1843  WBC 7.9 0.4*  HGB 9.6* 8.6*  HCT 28.3* 25.5*  PLT 72* 22*  MCV 90.7  90.7  MCH 30.8 30.6  MCHC 34.0 33.7  RDW 15.9* 14.6  LYMPHSABS 0.5* 0.2*  MONOABS 0.0* 0.1  EOSABS 0.2 0.1  BASOSABS 0.0 0.0   ------------------------------------------------------------------------------------------------------------------  Chemistries   Recent Labs Lab 02/05/15 1157 02/10/15 1843  NA 139 135  K 3.7 3.9  CL  --  104  CO2 20* 23  GLUCOSE 97 133*  BUN 28.5* 19  CREATININE 1.0 1.23  CALCIUM 8.6 7.9*  AST 34 16  ALT 101* 43  ALKPHOS 71 76  BILITOT 0.54 0.7   ------------------------------------------------------------------------------------------------------------------ estimated creatinine clearance is 63.9 mL/min (by C-G formula based on Cr of 1.23). ------------------------------------------------------------------------------------------------------------------ No results for input(s): TSH, T4TOTAL, T3FREE, THYROIDAB in the last 72 hours.  Invalid input(s): FREET3   Coagulation profile  Recent Labs Lab 02/10/15 1922  INR 1.10   ------------------------------------------------------------------------------------------------------------------- No results for input(s): DDIMER in the last 72 hours. -------------------------------------------------------------------------------------------------------------------  Cardiac Enzymes No results for input(s): CKMB, TROPONINI, MYOGLOBIN in the last 168 hours.  Invalid input(s): CK ------------------------------------------------------------------------------------------------------------------ Invalid input(s):  POCBNP   ---------------------------------------------------------------------------------------------------------------  Urinalysis    Component Value Date/Time   COLORURINE YELLOW 02/10/2015 2130   APPEARANCEUR CLOUDY* 02/10/2015 2130   LABSPEC 1.015 02/10/2015 2130   PHURINE 7.5 02/10/2015 2130   GLUCOSEU NEGATIVE 02/10/2015 2130   HGBUR NEGATIVE 02/10/2015 2130   BILIRUBINUR NEGATIVE 02/10/2015 2130   KETONESUR NEGATIVE 02/10/2015 2130   PROTEINUR NEGATIVE 02/10/2015 2130   UROBILINOGEN 0.2 02/10/2015 2130   NITRITE NEGATIVE 02/10/2015 2130   LEUKOCYTESUR NEGATIVE 02/10/2015 2130    ----------------------------------------------------------------------------------------------------------------   Imaging results:   Dg Chest 2 View  02/10/2015   CLINICAL DATA:  74 year old male with lymphoma and fever.  EXAM: CHEST  2 VIEW  COMPARISON:  CT dated 01/25/2015 and chest radiograph dated 08/19/2014  FINDINGS: Right pectoral Port-A-Cath with tip over central SVC. Two views of the chest demonstrate mild emphysematous changes of the lungs. No focal consolidation, pleural effusion, or pneumothorax. The cardiomediastinal silhouette is within normal limits. The osteopenia degenerative changes of the spine.  IMPRESSION: No focal consolidation.   Electronically Signed   By: Anner Crete M.D.   On: 02/10/2015 19:37   Ct Chest W Contrast  01/25/2015   CLINICAL DATA:  Mantle cell lymphoma  EXAM: CT CHEST, ABDOMEN, AND PELVIS WITH CONTRAST  TECHNIQUE: Multidetector CT imaging of the chest, abdomen and pelvis was performed following the standard protocol during bolus administration of intravenous contrast.  CONTRAST:  165mL OMNIPAQUE IOHEXOL 300 MG/ML  SOLN  COMPARISON:  PET-CT 10/24/2014 and prior CT scan 08/10/2014  FINDINGS: CT CHEST FINDINGS  Chest wall: No chest wall mass, supraclavicular or axillary adenopathy. A few small scattered lymph nodes are noted. A right-sided Port-A-Cath is in  place. The bony thorax is intact. No destructive bone lesions or spinal canal compromise.  Mediastinum: The heart is normal in size. No pericardial effusion. Stable aortic and coronary artery calcifications. The esophagus is grossly normal. There are small scattered mediastinal and hilar lymph nodes but no mass or adenopathy.  Lungs/ pleura: Small scattered subpleural nodules are likely lymph nodes. Most of these appear slightly larger. No infiltrates, edema or effusions.  CT ABDOMEN AND PELVIS FINDINGS  Hepatobiliary: No focal hepatic lesions or intrahepatic biliary dilatation. Gallbladder is normal. No common bile duct dilatation.  Pancreas: No mass, inflammation or ductal dilatation. Stable mild pancreatic atrophy and slightly prominent main pancreatic duct.  Spleen: Persistent splenomegaly. The spleen measures 18.2 x 18.1 x 10.8 Cm. Splenic volume is 1778 cubic  cm. This represents a significant increase in size since the PET-CT in March. Small peripheral splenic infarcts are noted.  Adrenals/Urinary Tract: The adrenal glands and kidneys are unremarkable and stable.  Stomach/Bowel: The stomach, duodenum, small bowel and colon are unremarkable. No inflammatory changes, mass lesions or obstructive findings. Moderate to advanced diverticulosis involving the sigmoid colon but no findings for acute diverticulitis. The terminal ileum is normal. The appendix is normal.  Vascular/Lymphatic: Periportal and celiac axis adenopathy. Index node on image number 62 measures 20 mm. This measured 18 mm on the CT scan from 08/10/2014 and 12 mm on the previous PET-CT. Hepatic duodenum ligament lymph node on image number 66 measures 17 mm in was 7 mm on the prior PET-CT. Numerous other lymph nodes have enlarged since the prior PET-CT. Small scattered retroperitoneal lymph nodes. The aorta demonstrates stable atherosclerotic disease. The major venous structures are patent. Very prominent splenic vein.  Other: The bladder, prostate  gland and seminal vesicles are unremarkable. No pelvic mass or adenopathy. No free pelvic fluid collections. No inguinal mass or adenopathy.  Musculoskeletal: No significant bony findings. Stable advanced degenerative changes involving the spine and bilateral pars defects at L5.  IMPRESSION: Recurrent splenomegaly and upper abdominal lymphadenopathy suggesting recurrent lymphoma. This study looks very similar to the CT scan from 08/10/2014.   Electronically Signed   By: Marijo Sanes M.D.   On: 01/25/2015 12:44   Ct Abdomen Pelvis W Contrast  01/25/2015   CLINICAL DATA:  Mantle cell lymphoma  EXAM: CT CHEST, ABDOMEN, AND PELVIS WITH CONTRAST  TECHNIQUE: Multidetector CT imaging of the chest, abdomen and pelvis was performed following the standard protocol during bolus administration of intravenous contrast.  CONTRAST:  153mL OMNIPAQUE IOHEXOL 300 MG/ML  SOLN  COMPARISON:  PET-CT 10/24/2014 and prior CT scan 08/10/2014  FINDINGS: CT CHEST FINDINGS  Chest wall: No chest wall mass, supraclavicular or axillary adenopathy. A few small scattered lymph nodes are noted. A right-sided Port-A-Cath is in place. The bony thorax is intact. No destructive bone lesions or spinal canal compromise.  Mediastinum: The heart is normal in size. No pericardial effusion. Stable aortic and coronary artery calcifications. The esophagus is grossly normal. There are small scattered mediastinal and hilar lymph nodes but no mass or adenopathy.  Lungs/ pleura: Small scattered subpleural nodules are likely lymph nodes. Most of these appear slightly larger. No infiltrates, edema or effusions.  CT ABDOMEN AND PELVIS FINDINGS  Hepatobiliary: No focal hepatic lesions or intrahepatic biliary dilatation. Gallbladder is normal. No common bile duct dilatation.  Pancreas: No mass, inflammation or ductal dilatation. Stable mild pancreatic atrophy and slightly prominent main pancreatic duct.  Spleen: Persistent splenomegaly. The spleen measures 18.2 x  18.1 x 10.8 Cm. Splenic volume is 1778 cubic cm. This represents a significant increase in size since the PET-CT in March. Small peripheral splenic infarcts are noted.  Adrenals/Urinary Tract: The adrenal glands and kidneys are unremarkable and stable.  Stomach/Bowel: The stomach, duodenum, small bowel and colon are unremarkable. No inflammatory changes, mass lesions or obstructive findings. Moderate to advanced diverticulosis involving the sigmoid colon but no findings for acute diverticulitis. The terminal ileum is normal. The appendix is normal.  Vascular/Lymphatic: Periportal and celiac axis adenopathy. Index node on image number 62 measures 20 mm. This measured 18 mm on the CT scan from 08/10/2014 and 12 mm on the previous PET-CT. Hepatic duodenum ligament lymph node on image number 66 measures 17 mm in was 7 mm on the prior PET-CT. Numerous other  lymph nodes have enlarged since the prior PET-CT. Small scattered retroperitoneal lymph nodes. The aorta demonstrates stable atherosclerotic disease. The major venous structures are patent. Very prominent splenic vein.  Other: The bladder, prostate gland and seminal vesicles are unremarkable. No pelvic mass or adenopathy. No free pelvic fluid collections. No inguinal mass or adenopathy.  Musculoskeletal: No significant bony findings. Stable advanced degenerative changes involving the spine and bilateral pars defects at L5.  IMPRESSION: Recurrent splenomegaly and upper abdominal lymphadenopathy suggesting recurrent lymphoma. This study looks very similar to the CT scan from 08/10/2014.   Electronically Signed   By: Marijo Sanes M.D.   On: 01/25/2015 12:44    My personal review of EKG: Rhythm NSR, Rate  98 /min, left axis deviation    Assessment & Plan  1. Febrile neutropenia 2. Mantle cell lymphoma status post failed stem cell treatment, on chemotherapy 3. Thrombocytopenia: Chemotherapy-induced  Plan  IV vancomycin and Maxipime IV fluids Consult  oncology in a.m.   DVT Prophylaxis SCDs  AM Labs Ordered, also please review Full Orders  Family Communication: Admission, patients condition and plan of care including tests being ordered have been discussed with the patient and daughter who indicate understanding and agree with the plan and Code Status.  Code Status full  Disposition Plan: Home  Time spent in minutes : 36 minutes  Condition GUARDED   @SIGNATURE @

## 2015-02-10 NOTE — ED Notes (Signed)
CG4 lactic result 1.25

## 2015-02-10 NOTE — ED Notes (Signed)
Patient's daughter gave the patient Tylenol approx 40 inutes prior to arrival tot he Ed. Patient's daughter states patientis lethargic and confused at times.

## 2015-02-11 ENCOUNTER — Encounter (HOSPITAL_COMMUNITY): Payer: Self-pay | Admitting: Neurology

## 2015-02-11 DIAGNOSIS — E46 Unspecified protein-calorie malnutrition: Secondary | ICD-10-CM

## 2015-02-11 DIAGNOSIS — R161 Splenomegaly, not elsewhere classified: Secondary | ICD-10-CM

## 2015-02-11 DIAGNOSIS — D630 Anemia in neoplastic disease: Secondary | ICD-10-CM

## 2015-02-11 DIAGNOSIS — G620 Drug-induced polyneuropathy: Secondary | ICD-10-CM

## 2015-02-11 DIAGNOSIS — R197 Diarrhea, unspecified: Secondary | ICD-10-CM

## 2015-02-11 DIAGNOSIS — D6959 Other secondary thrombocytopenia: Secondary | ICD-10-CM

## 2015-02-11 DIAGNOSIS — E440 Moderate protein-calorie malnutrition: Secondary | ICD-10-CM | POA: Insufficient documentation

## 2015-02-11 DIAGNOSIS — C8310 Mantle cell lymphoma, unspecified site: Secondary | ICD-10-CM

## 2015-02-11 DIAGNOSIS — I10 Essential (primary) hypertension: Secondary | ICD-10-CM

## 2015-02-11 DIAGNOSIS — D709 Neutropenia, unspecified: Principal | ICD-10-CM

## 2015-02-11 LAB — CBC
HEMATOCRIT: 24.6 % — AB (ref 39.0–52.0)
HEMOGLOBIN: 8.3 g/dL — AB (ref 13.0–17.0)
MCH: 30.9 pg (ref 26.0–34.0)
MCHC: 33.7 g/dL (ref 30.0–36.0)
MCV: 91.4 fL (ref 78.0–100.0)
Platelets: 19 10*3/uL — CL (ref 150–400)
RBC: 2.69 MIL/uL — AB (ref 4.22–5.81)
RDW: 14.9 % (ref 11.5–15.5)
WBC: 0.4 10*3/uL — CL (ref 4.0–10.5)

## 2015-02-11 LAB — BASIC METABOLIC PANEL
Anion gap: 6 (ref 5–15)
BUN: 18 mg/dL (ref 6–20)
CHLORIDE: 106 mmol/L (ref 101–111)
CO2: 24 mmol/L (ref 22–32)
CREATININE: 1.35 mg/dL — AB (ref 0.61–1.24)
Calcium: 7.7 mg/dL — ABNORMAL LOW (ref 8.9–10.3)
GFR calc Af Amer: 59 mL/min — ABNORMAL LOW (ref >60)
GFR calc non Af Amer: 50 mL/min — ABNORMAL LOW (ref >60)
Glucose, Bld: 108 mg/dL — ABNORMAL HIGH (ref 65–99)
Potassium: 4.3 mmol/L (ref 3.5–5.1)
SODIUM: 136 mmol/L (ref 135–145)

## 2015-02-11 LAB — CG4 I-STAT (LACTIC ACID): LACTIC ACID, VENOUS: 1.25 mmol/L (ref 0.5–2.0)

## 2015-02-11 MED ORDER — TBO-FILGRASTIM 480 MCG/0.8ML ~~LOC~~ SOSY
480.0000 ug | PREFILLED_SYRINGE | Freq: Every day | SUBCUTANEOUS | Status: DC
  Administered 2015-02-11 – 2015-02-13 (×3): 480 ug via SUBCUTANEOUS
  Filled 2015-02-11 (×7): qty 0.8

## 2015-02-11 MED ORDER — LIDOCAINE-PRILOCAINE 2.5-2.5 % EX CREA
TOPICAL_CREAM | Freq: Once | CUTANEOUS | Status: DC
  Filled 2015-02-11: qty 5

## 2015-02-11 MED ORDER — VANCOMYCIN HCL IN DEXTROSE 750-5 MG/150ML-% IV SOLN
750.0000 mg | Freq: Two times a day (BID) | INTRAVENOUS | Status: DC
Start: 2015-02-11 — End: 2015-02-14
  Administered 2015-02-11 – 2015-02-14 (×7): 750 mg via INTRAVENOUS
  Filled 2015-02-11 (×7): qty 150

## 2015-02-11 NOTE — Progress Notes (Signed)
Initial Nutrition Assessment  DOCUMENTATION CODES:  Non-severe (moderate) malnutrition in context of chronic illness  INTERVENTION: - Will order [employer] Breakfast once/day - RD will continue to monitor for needs  NUTRITION DIAGNOSIS:  Malnutrition related to chronic illness as evidenced by moderate depletions of muscle mass, moderate depletion of body fat.  GOAL:  Patient will meet greater than or equal to 90% of their needs  MONITOR:  PO intake, Weight trends, Labs  REASON FOR ASSESSMENT:  Malnutrition Screening Tool  ASSESSMENT: 74 y.o. 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  Pt seen for MST. BMI indicates overweight status. Per pt and RN student in the room, pt has been drinking liquids throughout the day. He indicates that MD recently allowed for diet advancement and he plans to order a ham and cheese sandwich. Pt reports good appetite on average PTA and was drinking Gatorade and Crystal Light to stay hydrated.   He indicates that he lost 2 lbs in the past few days which is confirmed by weight hx review. Weight has been stable from 199-210 lbs for the past 7 months. He was not drinking Ensure or Boost at home but he was drinking [employer] each morning and is interested in receiving it here.  Likely meeting needs PTA. Physical assessment indicate mild to moderate muscle and fat wasting. Medications reviewed. Labs reviewed; creatinine elevated, Ca: 7.7 mg/dL, GFR: 50.  Height:  Ht Readings from Last 1 Encounters:  02/10/15 6\' 3"  (1.905 m)    Weight:  Wt Readings from Last 1 Encounters:  02/10/15 202 lb 3.2 oz (91.717 kg)    Ideal Body Weight:  89.1 kg (kg)  Wt Readings from Last 10 Encounters:  02/10/15 202 lb 3.2 oz (91.717 kg)   02/05/15 204 lb 14.4 oz (92.942 kg)  01/29/15 209 lb (94.802 kg)  01/24/15 204 lb 14.4 oz (92.942 kg)  10/25/14 210 lb 3.2 oz (95.346 kg)  09/25/14 206 lb (93.441 kg)  09/25/14 206 lb (93.441 kg)  08/28/14 200 lb 14.4 oz (91.128 kg)  08/17/14 205 lb 11.2 oz (93.305 kg)  08/13/14 199 lb 6.4 oz (90.447 kg)    BMI:  Body mass index is 25.27 kg/(m^2).  Estimated Nutritional Needs:  Kcal:  4332-9518  Protein:  120-130 grams  Fluid:  2.2-2.5L/day  Skin:  Reviewed, no issues  Diet Order:  Diet Heart Room service appropriate?: Yes; Fluid consistency:: Thin  EDUCATION NEEDS:  No education needs identified at this time   Intake/Output Summary (Last 24 hours) at 02/11/15 1358 Last data filed at 02/10/15 2130  Gross per 24 hour  Intake      0 ml  Output    450 ml  Net   -450 ml    Last BM:  PTA     Jarome Matin, RD, LDN Inpatient Clinical Dietitian Pager # [phone] After hours/weekend pager # [phone]

## 2015-02-11 NOTE — Progress Notes (Addendum)
ANTIBIOTIC CONSULT NOTE - Follow Up  Pharmacy Consult for Cefepime, Vancomycin Indication: Febrile Neutropenia  No Known Allergies  Patient Measurements: Height: 6\' 3"  (190.5 cm) Weight: 202 lb 3.2 oz (91.717 kg) IBW/kg (Calculated) : 84.5  Vital Signs: Temp: 99.1 F (37.3 C) (07/11 0508) Temp Source: Oral (07/11 0508) BP: 148/55 mmHg (07/11 0508) Pulse Rate: 100 (07/11 0508) Intake/Output from previous day: 07/10 0701 - 07/11 0700 In: -  Out: 450 [Urine:450] Intake/Output from this shift:    Labs:  Recent Labs  02/10/15 1843 02/11/15 0454  WBC 0.4* 0.4*  HGB 8.6* 8.3*  PLT 22* 19*  CREATININE 1.23 1.35*   Estimated Creatinine Clearance: 58.2 mL/min (by C-G formula based on Cr of 1.35). No results for input(s): VANCOTROUGH, VANCOPEAK, VANCORANDOM, GENTTROUGH, GENTPEAK, GENTRANDOM, TOBRATROUGH, TOBRAPEAK, TOBRARND, AMIKACINPEAK, AMIKACINTROU, AMIKACIN in the last 72 hours.   Microbiology: Recent Results (from the past 720 hour(s))  TECHNOLOGIST REVIEW     Status: None   Collection Time: 01/24/15  8:55 AM  Result Value Ref Range Status   Technologist Review variant lymph, many oval, few acanthocytes  Final  Culture, blood (routine x 2)     Status: None (Preliminary result)   Collection Time: 02/10/15  6:43 PM  Result Value Ref Range Status   Specimen Description BLOOD RIGHT ARM  Final   Special Requests BOTTLES DRAWN AEROBIC AND ANAEROBIC 5CC EA  Final   Culture PENDING  Incomplete   Report Status PENDING  Incomplete    Medical History: Past Medical History  Diagnosis Date  . Thyroid disease   . Hyperlipemia   . Liver disease   . Malnutrition 08/30/2013  . Hypothyroidism   . Depression     Spouse passed 5 months ago  . Gout 09/28/2013  . Fatigue 08/31/2014  . Essential hypertension 10/25/2014  . 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 Patient found to have neutropenia with WBC of 0.4, ANC of 0. Pharmacy consulted to dose Cefepime and Vancomycin for febrile neutropenia.  7/10 >> Cefepime >> 7/10 >> Vancomycin >>    7/10 blood x 2: sent 7/10 urine: ordered   Day #2 Vancomycin 1.5g LD then 1g IV q12h and Cefepime 2g IV q8h.  SCr increased today to 1.35, CrCl~63 ml/min (CG), ~49 ml/min (normalized).  Goal of Therapy:  Appropriate antibiotic dosing for renal function and indication Eradication of infection  Plan:   Reduce Vancomycin to 750 mg IV q12h due to increased SCr.  Continue Cefepime 2g IV q8h.  Patient received Neulasta 6/27 (2 weeks ago). Start Granix inpatient until [facility] improves?  F/u daily.  Hershal Coria, PharmD, BCPS Pager: [phone] 02/11/2015 8:02 AM

## 2015-02-11 NOTE — Consult Note (Signed)
Manuel [name]  [phone]   Patient Care Team: Pcp Not In System as PCP - [place], [place] as Referring Physician (Hematology and Oncology)  HOSPITAL CONSULT  NOTE I have seen the patient, examined him and edited the notes as follows  HPI: Manuel [name] is a 74 year old man with a history of recurrent mantle cell lymphoma, s/p D1C1 chemotherapy with R-CHOP on 01/28/15, admitted with fevers up to 102 with chills. He denied night sweats. He denied any respiratory or cardiac complaints. He denied any nausea or vomiting, but did complain of watery diarrhea. He also complained of diarrhea. Denies abdominal pain. Appetite is decreased. Denies any dysuria. Denies abnormal skin rashes, or worsening neuropathy. Denies any bleeding issues such as epistaxis, hematemesis, hematuria or hematochezia. Ambulating without difficulty. He denies any sick contacts. CBC was remarkable for WBC of 0.4, with ANC of 0 suggestive of febrile neutropenia. His Hb was 8.6 and platelets 22k. Cultures were drawn (pending to date) and he was placed on IV antibiotics with Maxipime and vancomycin. His clinical status is slowly improving. His diarrhea is less frequent. His fever is resolving. We have been notified of the patient's admission.  The patient denies any recent signs or symptoms of bleeding such as spontaneous epistaxis, hematuria or hematochezia.   Oncology History   Mantle cell lymphoma   Primary site: Lymphoid Neoplasms (Bilateral)   Staging method: AJCC 6th Edition   Clinical: Stage IV signed by Heath Lark, MD on 09/26/2013 10:53 AM   Pathologic: Stage IV signed by Heath Lark, MD on 09/26/2013 10:53 AM   Summary: Stage IV       Mantle cell lymphoma   09/12/2013 Procedure Patient have right axillary lymph node biopsy that confirmed B-cell, non-Hodgkin's lymphoma, suspect mantle cell lymphoma   09/12/2013 Procedure Patient had placement of Infuse-a-Port   09/16/2013  Imaging PET/CT scan showed multifocal hypermetabolic nodal activity involving the neck, chest, abdomen and pelvis as described. In addition, there is moderate splenomegaly with associated hypermetabolic activity consistent with lymphomatous involvement   09/21/2013 - 01/26/2014 Chemotherapy The patient received 6 cycles of bendamustine and rituximab   01/24/2014 Imaging Repeat PET/CT scan showed near complete response to treatment.   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   07/13/2014 Imaging  repeat PET CT scan show complete remission   08/10/2014 Imaging  repeat CT scan of the neck, chest and abdomen show disease relapse   08/16/2014 - 08/22/2014 Hospital Admission He was admitted to the hospital because of respiratory failure, signs of sepsis and severe pancytopenia   08/17/2014 - 01/24/2015 Chemotherapy He was started on Ibrutinib and high dose prednisone, treatment stopped on 6/23 due to recurrent disease   10/24/2014 Imaging Repeat PET scan show marked reduction in splenomegaly and resolving hypermetabolic activity   7/89/3810 Imaging ECHO showed normal EF   01/25/2015 Imaging CT scan of chest, abdomen and pelvis showed recurrent splenomegaly and lymphadenopathy   01/28/2015 - 02/01/15 Hospital Admission He was admitted to the hospital for cycle one of [place]   7/10/16[facility] Admission He was admitted to the hospital for management of diarrhea and febrile neutropenia     MEDICATIONS: Scheduled Meds: . ceFEPime (MAXIPIME) IV  2 g Intravenous 3 times per day  . levothyroxine  75 mcg Oral QAC breakfast  . sodium chloride  3 mL Intravenous Q12H  . vancomycin  750 mg Intravenous Q12H   Continuous Infusions: . sodium  chloride 75 mL/hr at 02/11/15 0747   PRN Meds:.acetaminophen **OR** acetaminophen, albuterol, HYDROcodone-acetaminophen, LORazepam, morphine injection, ondansetron **OR** ondansetron (ZOFRAN) IV, zolpidem   ALLERGIES:   No Known Allergies   PHYSICAL EXAMINATION:  Filed Vitals:   02/11/15 1025  BP: 134/62  Pulse: 96  Temp: 99.2 F (37.3 C)  Resp: 19   Filed Weights   02/10/15 2315  Weight: 202 lb 3.2 oz (91.717 kg)    GENERAL:alert, no distress and uncomfortable due to diarrhea SKIN: skin color, texture, turgor are normal, no rashes or significant lesions EYES: normal, conjunctiva are pink and non-injected, sclera clear OROPHARYNX:no exudate, no erythema and lips, buccal mucosa, and tongue normal  NECK: supple, thyroid normal size, non-tender, without nodularity LYMPH:  no palpable lymphadenopathy in the cervical, axilary or inguinal areas. LUNGS: clear to auscultation and percussion with normal breathing effort HEART: regular rate & rhythm and no murmurs and no lower extremity edema ABDOMEN: soft, non-tender and active bowel sounds. Palpable splenomegaly. Musculoskeletal:no cyanosis of digits and no clubbing  PSYCH: alert & oriented x 3 with fluent speech NEURO: no focal motor/sensory deficits   LABORATORY/RADIOLOGY DATA:   Recent Labs Lab 02/05/15 1157 02/10/15 1843 02/11/15 0454  WBC 7.9 0.4* 0.4*  HGB 9.6* 8.6* 8.3*  HCT 28.3* 25.5* 24.6*  PLT 72* 22* 19*  MCV 90.7 90.7 91.4  MCH 30.8 30.6 30.9  MCHC 34.0 33.7 33.7  RDW 15.9* 14.6 14.9  LYMPHSABS 0.5* 0.2*  --   MONOABS 0.0* 0.1  --   EOSABS 0.2 0.1  --   BASOSABS 0.0 0.0  --     CMP    Recent Labs Lab 02/05/15 1157 02/10/15 1843 02/11/15 0454  NA 139 135 136  K 3.7 3.9 4.3  CL  --  104 106  CO2 20* 23 24  GLUCOSE 97 133* 108*  BUN 28.5* 19 18  CREATININE 1.0 1.23 1.35*  CALCIUM 8.6 7.9* 7.7*  AST 34 16  --   ALT 101* 43  --   ALKPHOS 71 76  --   BILITOT 0.54 0.7  --         Component Value Date/Time   BILITOT 0.7 02/10/2015 1843   BILITOT 0.54 02/05/2015 1157        Component Value Date/Time   ESRSEDRATE 42* 08/30/2013 1110     Recent Labs Lab 02/10/15 1922  INR 1.10       Urinalysis    Component Value Date/Time   COLORURINE YELLOW 02/10/2015 2130   APPEARANCEUR CLOUDY* 02/10/2015 2130   LABSPEC 1.015 02/10/2015 2130   PHURINE 7.5 02/10/2015 2130   GLUCOSEU NEGATIVE 02/10/2015 2130   HGBUR NEGATIVE 02/10/2015 2130   BILIRUBINUR NEGATIVE 02/10/2015 2130   KETONESUR NEGATIVE 02/10/2015 2130   PROTEINUR NEGATIVE 02/10/2015 2130   UROBILINOGEN 0.2 02/10/2015 2130   NITRITE NEGATIVE 02/10/2015 2130   LEUKOCYTESUR NEGATIVE 02/10/2015 2130      Liver Function Tests:  Recent Labs Lab 02/05/15 1157 02/10/15 1843  AST 34 16  ALT 101* 43  ALKPHOS 71 76  BILITOT 0.54 0.7  PROT 5.2* 5.7*  ALBUMIN 3.4* 3.0*    Recent Labs Lab 02/10/15 1922  LIPASE 12*    Radiology Studies: I have reviewed the imaging study  Dg Chest 2 View  02/10/2015   CLINICAL DATA:  74 year old male with lymphoma and fever.  EXAM: CHEST  2 VIEW  COMPARISON:  CT dated 01/25/2015 and chest radiograph dated 08/19/2014  FINDINGS: Right pectoral Port-A-Cath with tip over  central SVC. Two views of the chest demonstrate mild emphysematous changes of the lungs. No focal consolidation, pleural effusion, or pneumothorax. The cardiomediastinal silhouette is within normal limits. The osteopenia degenerative changes of the spine.  IMPRESSION: No focal consolidation.   Electronically Signed   By: Anner Crete M.D.   On: 02/10/2015 19:37   ASSESSMENT AND PLAN:  Mantle cell lymphoma After discussion with tumor board, the consensus was to proceed with similar chemotherapy for several cycles, followed by maintenance. He is s/p C1D1 chemo with R-EPOCH on 6/27. Rituxan was dose modified due to nausea. He is due for more chemo on 7/20 Continue supportive care for now  Febrile Neutropenia Likely due to recent chemotherapy Cultures are pending Continue antipyretics, IV antibiotics, IVF. Clinically he is improving Check differential daily Patient refused Neulasta in the past due to  musculoskeletal pain. I discussed with the patient the risk, benefit, side effects of G-CSF with Granix which is not the same as Neulasta. He agreed to proceed. I will start him on Granix 480 g daily until [place] is greater than 1500  Anemia in neoplastic disease This is likely anemia of chronic disease and underlying disease, recent chemotherapy.  The patient denies recent history of bleeding such as epistaxis, hematuria or hematochezia.  He is asymptomatic from the anemia. We will observe He does not require transfusion now unless hemoglobin is less than 8 g  Risk of tumor lysis Continue on allopurinol daily.  Essential hypertension He has started to become hypertensive after Hydrochlorothiazide was discontinued. He is not symptomatic Plan to observe for now.  Thrombocytopenia This is due to splenomegaly and relapsed disease, recent chemo as well as acute illness The patient denies any recent signs or symptoms of bleeding such as spontaneous epistaxis, hematuria or hematochezia. Aspirin is on hold His current platelet count is 19k, likely expected to increase once symptoms improve Transfuse if platelet count is less than 10,000, or if bleeding occurs.  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 Plan to reduce the dose of vincristine in the future.  Malnutrition Appreciate Nutrition follow up  DVT prophylaxis On mechanical devices  Full Code  Other medical issues as per admitting team  Discharge planning He is not safe to be discharged unless [place] is greater than 1500 without any further fevers   Manuel [name],Manuel E, PA-C 02/11/2015, 1:57 PM [place], Manuel Steidle, MD 02/11/2015

## 2015-02-11 NOTE — Progress Notes (Signed)
[facility] PROGRESS NOTE  Manuel [name] ENI:[id number] DOB: 1941/05/30 DOA: 02/10/2015 PCP: Pcp Not In System  Assessment/Plan:  Active Problems:   Neutropenia, febrile -Await cultures -Continue broad-spectrum antibiotics - Oncology also on board and assisting with management    Malnutrition of moderate degree -Will liberalize diet  Mental cell lymphoma - With improvement in condition patient can follow-up with oncology for further evaluation recommendations  Hypothyroidism -Continue Synthroid  Code Status: Full Family Communication: No family at bedside Disposition Plan: Pending continued improvement in condition   Consultants:  Oncology  Procedures:  None  Antibiotics:  Cefepime and vancomycin  HPI/Subjective: The patient has no new complaints. No acute issues reported overnight  Objective: Filed Vitals:   02/11/15 1427  BP: 137/64  Pulse: 101  Temp: 99.5 F (37.5 C)  Resp: 20    Intake/Output Summary (Last 24 hours) at 02/11/15 1612 Last data filed at 02/11/15 1500  Gross per 24 hour  Intake 541.25 ml  Output    450 ml  Net  91.25 ml   Filed Weights   02/10/15 2315  Weight: 91.717 kg (202 lb 3.2 oz)    Exam:   General:  Patient in no acute distress, alert and awake  Cardiovascular: Regular rate and rhythm, no murmurs or rubs  Respiratory: No increased work of breathing, no wheezes, no rales  Abdomen: Soft, nondistended, nontender  Musculoskeletal: No cyanosis or clubbing   Data Reviewed: Basic Metabolic Panel:  Recent Labs Lab 02/05/15 1157 02/10/15 1843 02/11/15 0454  NA 139 135 136  K 3.7 3.9 4.3  CL  --  104 106  CO2 20* 23 24  GLUCOSE 97 133* 108*  BUN 28.5* 19 18  CREATININE 1.0 1.23 1.35*  CALCIUM 8.6 7.9* 7.7*   Liver Function Tests:  Recent Labs Lab 02/05/15 1157 02/10/15 1843  AST 34 16  ALT 101* 43  ALKPHOS 71 76  BILITOT 0.54 0.7  PROT 5.2* 5.7*  ALBUMIN 3.4* 3.0*    Recent Labs Lab  02/10/15 1922  LIPASE 12*   No results for input(s): AMMONIA in the last 168 hours. CBC:  Recent Labs Lab 02/05/15 1157 02/10/15 1843 02/11/15 0454  WBC 7.9 0.4* 0.4*  NEUTROABS 7.3* 0.0*  --   HGB 9.6* 8.6* 8.3*  HCT 28.3* 25.5* 24.6*  MCV 90.7 90.7 91.4  PLT 72* 22* 19*   Cardiac Enzymes: No results for input(s): CKTOTAL, CKMB, CKMBINDEX, TROPONINI in the last 168 hours. BNP (last 3 results) No results for input(s): BNP in the last 8760 hours.  ProBNP (last 3 results) No results for input(s): PROBNP in the last 8760 hours.  CBG: No results for input(s): GLUCAP in the last 168 hours.  Recent Results (from the past 240 hour(s))  Culture, blood (routine x 2)     Status: None (Preliminary result)   Collection Time: 02/10/15  6:43 PM  Result Value Ref Range Status   Specimen Description BLOOD RIGHT ARM  Final   Special Requests BOTTLES DRAWN AEROBIC AND ANAEROBIC 5CC EA  Final   Culture   Final    NO GROWTH < 24 HOURS Performed at [facility]    Report Status PENDING  Incomplete  Culture, blood (routine x 2)     Status: None (Preliminary result)   Collection Time: 02/10/15  6:58 PM  Result Value Ref Range Status   Specimen Description BLOOD RIGHT ARM  Final   Special Requests BOTTLES DRAWN AEROBIC AND ANAEROBIC 5CC  Final   Culture  Final    NO GROWTH < 24 HOURS Performed at [facility]    Report Status PENDING  Incomplete     Studies: Dg Chest 2 View  02/10/2015   CLINICAL DATA:  74 year old male with lymphoma and fever.  EXAM: CHEST  2 VIEW  COMPARISON:  CT dated 01/25/2015 and chest radiograph dated 08/19/2014  FINDINGS: Right pectoral Port-A-Cath with tip over central SVC. Two views of the chest demonstrate mild emphysematous changes of the lungs. No focal consolidation, pleural effusion, or pneumothorax. The cardiomediastinal silhouette is within normal limits. The osteopenia degenerative changes of the spine.  IMPRESSION: No focal  consolidation.   Electronically Signed   By: Anner Crete M.D.   On: 02/10/2015 19:37    Scheduled Meds: . ceFEPime (MAXIPIME) IV  2 g Intravenous 3 times per day  . levothyroxine  75 mcg Oral QAC breakfast  . lidocaine-prilocaine   Topical Once  . sodium chloride  3 mL Intravenous Q12H  . Tbo-filgastrim (GRANIX) SQ  480 mcg Subcutaneous q1800  . vancomycin  750 mg Intravenous Q12H   Continuous Infusions: . sodium chloride 75 mL/hr at 02/11/15 0747    Time spent: > 35 minutes   [name]  [facility] Pager [phone] If 7PM-7AM, please contact night-coverage at [email or web address], password [facility] 02/11/2015, 4:12 PM  LOS: 1 day

## 2015-02-12 ENCOUNTER — Telehealth: Payer: Self-pay | Admitting: *Deleted

## 2015-02-12 ENCOUNTER — Telehealth: Payer: Self-pay | Admitting: Hematology and Oncology

## 2015-02-12 LAB — CBC WITH DIFFERENTIAL/PLATELET
Basophils Absolute: 0 10*3/uL (ref 0.0–0.1)
Basophils Relative: 1 % (ref 0–1)
Eosinophils Absolute: 0.1 10*3/uL (ref 0.0–0.7)
Eosinophils Relative: 15 % — ABNORMAL HIGH (ref 0–5)
HCT: 22.9 % — ABNORMAL LOW (ref 39.0–52.0)
Hemoglobin: 7.7 g/dL — ABNORMAL LOW (ref 13.0–17.0)
Lymphocytes Relative: 35 % (ref 12–46)
Lymphs Abs: 0.2 10*3/uL — ABNORMAL LOW (ref 0.7–4.0)
MCH: 30.1 pg (ref 26.0–34.0)
MCHC: 33.6 g/dL (ref 30.0–36.0)
MCV: 89.5 fL (ref 78.0–100.0)
Monocytes Absolute: 0.1 10*3/uL (ref 0.1–1.0)
Monocytes Relative: 20 % — ABNORMAL HIGH (ref 3–12)
Neutro Abs: 0.3 10*3/uL — ABNORMAL LOW (ref 1.7–7.7)
Neutrophils Relative %: 29 % — ABNORMAL LOW (ref 43–77)
Platelets: 36 10*3/uL — ABNORMAL LOW (ref 150–400)
RBC: 2.56 MIL/uL — ABNORMAL LOW (ref 4.22–5.81)
RDW: 14.7 % (ref 11.5–15.5)
WBC: 0.7 10*3/uL — CL (ref 4.0–10.5)

## 2015-02-12 LAB — URINE CULTURE

## 2015-02-12 NOTE — Progress Notes (Signed)
[facility] PROGRESS NOTE  Manuel [name] CHY:[id number] DOB: 11-06-1940 DOA: 02/10/2015 PCP: Pcp Not In System   Brief narrative: Patient is a 74 year old with history of recurrent mantle cell lymphoma, s/p D1C1 chemotherapy with R-CHOP on 01/28/15, admitted with fevers up to 102 with chills. Currently being treated for febrile neutropenia. No source of infection identified at this juncture  Assessment/Plan:  Active Problems:   Neutropenia, febrile -Await cultures -Continue current antibiotics regimen - Oncology also on board and assisting with management    Malnutrition of moderate degree -Will liberalize diet  Mental cell lymphoma - Oncology on board  Hypothyroidism -Continue Synthroid  Code Status: Full Family Communication: No family at bedside Disposition Plan: Pending continued improvement in condition   Consultants:  Oncology  Procedures:  None  Antibiotics:  Cefepime and vancomycin  HPI/Subjective: The patient has no new complaints. No acute issues reported overnight  Objective: Filed Vitals:   02/12/15 1523  BP: 111/51  Pulse: 85  Temp: 98.3 F (36.8 C)  Resp: 20    Intake/Output Summary (Last 24 hours) at 02/12/15 1904 Last data filed at 02/12/15 1815  Gross per 24 hour  Intake   3770 ml  Output   1400 ml  Net   2370 ml   Filed Weights   02/10/15 2315  Weight: 91.717 kg (202 lb 3.2 oz)    Exam:   General:  Patient in no acute distress, alert and awake  Cardiovascular: Regular rate and rhythm, no murmurs or rubs  Respiratory: No increased work of breathing, no wheezes, no rales  Abdomen: Soft, nondistended, nontender  Musculoskeletal: No cyanosis or clubbing   Data Reviewed: Basic Metabolic Panel:  Recent Labs Lab 02/10/15 1843 02/11/15 0454  NA 135 136  K 3.9 4.3  CL 104 106  CO2 23 24  GLUCOSE 133* 108*  BUN 19 18  CREATININE 1.23 1.35*  CALCIUM 7.9* 7.7*   Liver Function Tests:  Recent Labs Lab  02/10/15 1843  AST 16  ALT 43  ALKPHOS 76  BILITOT 0.7  PROT 5.7*  ALBUMIN 3.0*    Recent Labs Lab 02/10/15 1922  LIPASE 12*   No results for input(s): AMMONIA in the last 168 hours. CBC:  Recent Labs Lab 02/10/15 1843 02/11/15 0454 02/12/15 0449  WBC 0.4* 0.4* 0.7*  NEUTROABS 0.0*  --  0.3*  HGB 8.6* 8.3* 7.7*  HCT 25.5* 24.6* 22.9*  MCV 90.7 91.4 89.5  PLT 22* 19* 36*   Cardiac Enzymes: No results for input(s): CKTOTAL, CKMB, CKMBINDEX, TROPONINI in the last 168 hours. BNP (last 3 results) No results for input(s): BNP in the last 8760 hours.  ProBNP (last 3 results) No results for input(s): PROBNP in the last 8760 hours.  CBG: No results for input(s): GLUCAP in the last 168 hours.  Recent Results (from the past 240 hour(s))  Culture, blood (routine x 2)     Status: None (Preliminary result)   Collection Time: 02/10/15  6:43 PM  Result Value Ref Range Status   Specimen Description BLOOD RIGHT ARM  Final   Special Requests BOTTLES DRAWN AEROBIC AND ANAEROBIC 5CC EA  Final   Culture   Final    NO GROWTH 2 DAYS Performed at [facility]    Report Status PENDING  Incomplete  Culture, blood (routine x 2)     Status: None (Preliminary result)   Collection Time: 02/10/15  6:58 PM  Result Value Ref Range Status   Specimen Description BLOOD RIGHT ARM  Final   Special Requests BOTTLES DRAWN AEROBIC AND ANAEROBIC 5CC  Final   Culture   Final    NO GROWTH 2 DAYS Performed at [facility]    Report Status PENDING  Incomplete  Urine culture     Status: None   Collection Time: 02/10/15  9:30 PM  Result Value Ref Range Status   Specimen Description URINE, CLEAN CATCH  Final   Special Requests NONE  Final   Culture   Final    MULTIPLE SPECIES PRESENT, SUGGEST RECOLLECTION IF CLINICALLY INDICATED Performed at [facility]    Report Status 02/12/2015 FINAL  Final     Studies: Dg Chest 2 View  02/10/2015   CLINICAL DATA:  74 year old male  with lymphoma and fever.  EXAM: CHEST  2 VIEW  COMPARISON:  CT dated 01/25/2015 and chest radiograph dated 08/19/2014  FINDINGS: Right pectoral Port-A-Cath with tip over central SVC. Two views of the chest demonstrate mild emphysematous changes of the lungs. No focal consolidation, pleural effusion, or pneumothorax. The cardiomediastinal silhouette is within normal limits. The osteopenia degenerative changes of the spine.  IMPRESSION: No focal consolidation.   Electronically Signed   By: Anner Crete M.D.   On: 02/10/2015 19:37    Scheduled Meds: . ceFEPime (MAXIPIME) IV  2 g Intravenous 3 times per day  . levothyroxine  75 mcg Oral QAC breakfast  . lidocaine-prilocaine   Topical Once  . sodium chloride  3 mL Intravenous Q12H  . Tbo-filgastrim (GRANIX) SQ  480 mcg Subcutaneous q1800  . vancomycin  750 mg Intravenous Q12H   Continuous Infusions: . sodium chloride 75 mL/hr at 02/12/15 1156    Time spent: > 35 minutes   [name]  [facility] Pager [phone] If 7PM-7AM, please contact night-coverage at [email or web address], password [facility] 02/12/2015, 7:04 PM  LOS: 2 days

## 2015-02-12 NOTE — Telephone Encounter (Signed)
s.w. pt and advised on July appt 7.15 cx

## 2015-02-12 NOTE — Care Management Important Message (Signed)
Important Message  Patient Details  Name: Abhiraj Dozal MRN: [id number] Date of Birth: Aug 03, 1941   Medicare Important Message Given:  Yes-second notification given    Camillo Flaming 02/12/2015, 11:59 AMImportant Message  Patient Details  Name: Kevonte Vanecek MRN: [id number] Date of Birth: 05/09/1941   Medicare Important Message Given:  Yes-second notification given    Camillo Flaming 02/12/2015, 11:59 AM

## 2015-02-12 NOTE — Progress Notes (Signed)
Manuel [name]   DOB:[birth year]   TI#:[id number]   ASN#:[id number]  Patient Care Team: Pcp Not In System as PCP - Keya Paha, MD as Referring Physician (Hematology and Oncology)  I have seen the patient, examined him and edited the notes as follows  Subjective: He is now afebrile. He denies any chills or night sweats. Denies vision changes, or mucositis. Denies any respiratory complaints. Denies any chest pain or palpitations. Denies lower extremity swelling. Denies nausea, heartburn. His diarrhea is still present, but less frequent. Appetite is decreased. Denies any dysuria. Denies abnormal skin rashes, or neuropathy. Denies any bleeding issues such as epistaxis, hematemesis, hematuria or hematochezia. Ambulating without difficulty. He denies signs and symptoms of anemia such as dizziness, chest pain or shortness of breath  Scheduled Meds: . ceFEPime (MAXIPIME) IV  2 g Intravenous 3 times per day  . levothyroxine  75 mcg Oral QAC breakfast  . lidocaine-prilocaine   Topical Once  . sodium chloride  3 mL Intravenous Q12H  . Tbo-filgastrim (GRANIX) SQ  480 mcg Subcutaneous q1800  . vancomycin  750 mg Intravenous Q12H   Continuous Infusions: . sodium chloride 75 mL/hr at 02/11/15 2033   PRN Meds:.acetaminophen **OR** acetaminophen, albuterol, HYDROcodone-acetaminophen, LORazepam, morphine injection, ondansetron **OR** ondansetron (ZOFRAN) IV, zolpidem  Objective:  Filed Vitals:   02/12/15 0507  BP: 111/60  Pulse: 95  Temp: 98.7 F (37.1 C)  Resp: 19      Intake/Output Summary (Last 24 hours) at 02/12/15 0657 Last data filed at 02/12/15 0507  Gross per 24 hour  Intake   2080 ml  Output    700 ml  Net   1380 ml     GENERAL:alert, no distress and comfortable  SKIN: skin color is pale, texture, turgor are normal, no rashes or significant lesions EYES: normal, conjunctiva are pale and non-injected, sclera clear OROPHARYNX:no exudate, no erythema and lips, buccal  mucosa, and tongue normal  NECK: supple, thyroid normal size, non-tender, without nodularity LYMPH: no palpable lymphadenopathy in the cervical, axillary or inguinal areas. LUNGS: clear to auscultation and percussion with normal breathing effort HEART: regular rate & rhythm and no murmurs and no lower extremity edema ABDOMEN: soft, non-tender and active bowel sounds. Palpable splenomegaly. Musculoskeletal:no cyanosis of digits and no clubbing  PSYCH: alert & oriented x 3 with fluent speech NEURO: no focal motor/sensory deficits     Labs:   Recent Labs Lab 02/05/15 1157 02/10/15 1843 02/11/15 0454 02/12/15 0449  WBC 7.9 0.4* 0.4* 0.7*  HGB 9.6* 8.6* 8.3* 7.7*  HCT 28.3* 25.5* 24.6* 22.9*  PLT 72* 22* 19* 36*  MCV 90.7 90.7 91.4 89.5  MCH 30.8 30.6 30.9 30.1  MCHC 34.0 33.7 33.7 33.6  RDW 15.9* 14.6 14.9 14.7  LYMPHSABS 0.5* 0.2*  --  0.2*  MONOABS 0.0* 0.1  --  0.1  EOSABS 0.2 0.1  --  0.1  BASOSABS 0.0 0.0  --  0.0     Chemistries:    Recent Labs Lab 02/05/15 1157 02/10/15 1843 02/11/15 0454  NA 139 135 136  K 3.7 3.9 4.3  CL  --  104 106  CO2 20* 23 24  GLUCOSE 97 133* 108*  BUN 28.5* 19 18  CREATININE 1.0 1.23 1.35*  CALCIUM 8.6 7.9* 7.7*  AST 34 16  --   ALT 101* 43  --   ALKPHOS 71 76  --   BILITOT 0.54 0.7  --     GFR Estimated Creatinine Clearance: 58.2 mL/min (  by C-G formula based on Cr of 1.35).  Liver Function Tests:  Recent Labs Lab 02/05/15 1157 02/10/15 1843  AST 34 16  ALT 101* 43  ALKPHOS 71 76  BILITOT 0.54 0.7  PROT 5.2* 5.7*  ALBUMIN 3.4* 3.0*    Recent Labs Lab 02/10/15 1922  LIPASE 12*    Urine Studies     Component Value Date/Time   COLORURINE YELLOW 02/10/2015 2130   APPEARANCEUR CLOUDY* 02/10/2015 2130   LABSPEC 1.015 02/10/2015 2130   PHURINE 7.5 02/10/2015 2130   GLUCOSEU NEGATIVE 02/10/2015 2130   HGBUR NEGATIVE 02/10/2015 2130   BILIRUBINUR NEGATIVE 02/10/2015 2130   KETONESUR NEGATIVE 02/10/2015  2130   PROTEINUR NEGATIVE 02/10/2015 2130   UROBILINOGEN 0.2 02/10/2015 2130   NITRITE NEGATIVE 02/10/2015 2130   LEUKOCYTESUR NEGATIVE 02/10/2015 2130    Coagulation profile  Recent Labs Lab 02/10/15 1922  INR 1.10   Microbiology Cultures negative to date   Imaging Studies:  None today  Assessment/Plan: 74 y.o.  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 He is s/p C1D1 chemo with R-EPOCH on 6/27. Rituxan was dose modified due to nausea. He is due for more chemo on 7/20; likely to be delayed by at least 1 week as he is still feeling weak Continue supportive care for now  Febrile Neutropenia Likely due to recent chemotherapy Cultures are negative to date Continue antipyretics, IV antibiotics, IVF. Clinically he is improving Fever has resolved. Patient refused Neulasta in the past due to musculoskeletal pain. Discussed with the patient the risk, benefit, side effects of G-CSF with Granix which is not the same as Neulasta. He agreed to proceed He was started on Granix 480 g on 7/11 with some response, today's ANC is 0.3 (for a WBC 0.7).  Continue Granix daily until [facility] is greater than 1500 Check differential daily  Anemia in neoplastic disease This is likely anemia of chronic disease and underlying disease, recent chemotherapy.  The patient denies recent history of bleeding such as epistaxis, hematuria or hematochezia.  Hb today is 7.7, but the patient is asymptomatic; will hold transfusion for now  Risk of tumor lysis Continue on allopurinol daily.  Essential hypertension He has started to become hypertensive after Hydrochlorothiazide was discontinued. He is not symptomatic Plan to observe for now.  Thrombocytopenia This is due to splenomegaly and relapsed disease, recent chemo as well as acute illness This is expected to increase as symptoms  improve The patient denies any recent signs or symptoms of bleeding such as spontaneous epistaxis, hematuria or hematochezia. Aspirin is on hold His current platelet has improved from 19k to 36k today  Transfuse if platelet count is less than 10,000, or if bleeding occurs.  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 Plan to reduce the dose of vincristine in the future.  Malnutrition Appreciate Nutrition follow up  DVT prophylaxis On mechanical devices  Full Code  Discharge planning He is not safe to be discharged unless [place] is greater than 1500 without any further fevers  Other medical issues as per admitting team   [facility], PA-C 02/12/2015  6:57 AM Jaaziel Peatross, MD 02/12/2015

## 2015-02-12 NOTE — Telephone Encounter (Signed)
I spoke with the patient's daughter and give her an update about her father's progress in the hospital. Overall, he is improving. I anticipate discharge home hopefully by the end of the week.

## 2015-02-12 NOTE — Telephone Encounter (Signed)
Daughter, Freida Busman, left VM asking if Dr. Alvy Bimler can share any information with her such as, 1. Are my dad's counts improving?  2. When do you expect him to come home?  And 3.  He is still not acting right.  He is not as confused as before but still "talking odd."  What is this due to and will it get better?   Freida Busman is working today but will come to see her dad around lunchtime.

## 2015-02-13 DIAGNOSIS — R5081 Fever presenting with conditions classified elsewhere: Secondary | ICD-10-CM

## 2015-02-13 DIAGNOSIS — M545 Low back pain: Secondary | ICD-10-CM

## 2015-02-13 DIAGNOSIS — D709 Neutropenia, unspecified: Secondary | ICD-10-CM | POA: Insufficient documentation

## 2015-02-13 DIAGNOSIS — D6481 Anemia due to antineoplastic chemotherapy: Secondary | ICD-10-CM

## 2015-02-13 DIAGNOSIS — E440 Moderate protein-calorie malnutrition: Secondary | ICD-10-CM

## 2015-02-13 DIAGNOSIS — D638 Anemia in other chronic diseases classified elsewhere: Secondary | ICD-10-CM

## 2015-02-13 DIAGNOSIS — R509 Fever, unspecified: Secondary | ICD-10-CM | POA: Insufficient documentation

## 2015-02-13 LAB — CBC WITH DIFFERENTIAL/PLATELET
Basophils Absolute: 0 10*3/uL (ref 0.0–0.1)
Basophils Relative: 1 % (ref 0–1)
EOS ABS: 0.1 10*3/uL (ref 0.0–0.7)
Eosinophils Relative: 7 % — ABNORMAL HIGH (ref 0–5)
HEMATOCRIT: 23 % — AB (ref 39.0–52.0)
Hemoglobin: 7.6 g/dL — ABNORMAL LOW (ref 13.0–17.0)
LYMPHS ABS: 0.3 10*3/uL — AB (ref 0.7–4.0)
Lymphocytes Relative: 15 % (ref 12–46)
MCH: 30 pg (ref 26.0–34.0)
MCHC: 33 g/dL (ref 30.0–36.0)
MCV: 90.9 fL (ref 78.0–100.0)
MONOS PCT: 16 % — AB (ref 3–12)
Monocytes Absolute: 0.3 10*3/uL (ref 0.1–1.0)
NEUTROS ABS: 1.1 10*3/uL — AB (ref 1.7–7.7)
NEUTROS PCT: 61 % (ref 43–77)
NRBC: 1 /100{WBCs} — AB
Platelets: 51 10*3/uL — ABNORMAL LOW (ref 150–400)
RBC: 2.53 MIL/uL — AB (ref 4.22–5.81)
RDW: 14.9 % (ref 11.5–15.5)
WBC: 1.8 10*3/uL — ABNORMAL LOW (ref 4.0–10.5)

## 2015-02-13 LAB — CREATININE, SERUM
Creatinine, Ser: 1.18 mg/dL (ref 0.61–1.24)
GFR calc Af Amer: >60 mL/min (ref >60)
GFR calc non Af Amer: 59 mL/min — ABNORMAL LOW (ref >60)

## 2015-02-13 MED ORDER — SODIUM CHLORIDE 0.9 % IJ SOLN
10.0000 mL | INTRAMUSCULAR | Status: DC | PRN
  Administered 2015-02-13: 20 mL
  Filled 2015-02-13: qty 40

## 2015-02-13 MED ORDER — SODIUM CHLORIDE 0.9 % IV SOLN
Freq: Once | INTRAVENOUS | Status: AC
  Administered 2015-02-13: 13:00:00 via INTRAVENOUS

## 2015-02-13 NOTE — Progress Notes (Signed)
[facility]                                                                              Patient Demographics  Manuel [name], is a 74 y.o. male, DOB - November 28, 1940, AUQ:[id number]  Admit date - 02/10/2015   Admitting Physician Merton Border, MD  Outpatient Primary MD for the patient is Pcp Not In System  LOS - 3   Chief Complaint  Patient presents with  . Fever  . Emesis      HPI on 02/10/2015 by Dr. Merton Border  Manuel [name] is a 74 y.o. 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 The patient had good breakfast today and his power of attorney, his daughter noted a fever of 102.2 and was advised to come to the emergency room. Patient denies any shortness of breath cough or urinary symptoms. He is sitting in bed with shaking chills. Denies any headaches or loss of consciousness . In the emergency room he was noted to be febrile with a white blood cell count of 0.4.  Assessment & Plan   Neutropenic Fever -Continue vancomycin and cefepime -Remains afebrile for the past 24 hours -Oncology consulted and appreciated, recommended ANC be over 1.5 before discharge -WBC 1.8, ANC today 1.1 -Continue Granix  Malnutrition of moderate degree -Continue heart healthy diet  Mantle cell lymphoma -Currently receiving R CHOP -Followed by Dr. Alvy Bimler, oncology -Continue allopurinol for risk of tumor lysis  Hypothyroidism -Continue Synthroid  Anemia in neoplastic disease -I be secondary to recent chemotherapy -Hemoglobin currently 7.6 -Will continue to monitor CBC -Oncology ordered blood transfusion  Thrombocytopenia -Likely due to chemotherapy versus acute illness -Platelets currently 51 -Aspirin held -No signs of bleeding  Neuropathy -Likely due to chemotherapy -Oncology plans to  reduce vincristine  Back pain -Patient states this started after receiving chemotherapy and Granix -Feels his back pain is better this morning -Continue heating pad and pain control -We'll consult PT  Code Status: Full  Family Communication: None at bedside  Disposition Plan: Admitted  Time Spent in minutes   30 minutes  Procedures  None  Consults   Oncology  DVT Prophylaxis  SCDs  Lab Results  Component Value Date   PLT 51* 02/13/2015    Medications  Scheduled Meds: . ceFEPime (MAXIPIME) IV  2 g Intravenous 3 times per day  . levothyroxine  75 mcg Oral QAC breakfast  . lidocaine-prilocaine   Topical Once  . sodium chloride  3 mL Intravenous Q12H  . Tbo-filgastrim (GRANIX) SQ  480 mcg Subcutaneous q1800  . vancomycin  750 mg Intravenous Q12H   Continuous Infusions: . sodium chloride 75 mL/hr at 02/13/15 1316   PRN Meds:.acetaminophen **OR** acetaminophen, albuterol, HYDROcodone-acetaminophen, LORazepam, morphine injection, ondansetron **OR** ondansetron (ZOFRAN) IV, sodium chloride, zolpidem  Antibiotics    Anti-infectives    Start     Dose/Rate Route Frequency Ordered Stop   02/11/15 1000  vancomycin (VANCOCIN) IVPB 1000 mg/200 mL premix  Status:  Discontinued     1,000 mg 200  mL/hr over 60 Minutes Intravenous Every 12 hours 02/10/15 2311 02/11/15 0758   02/11/15 1000  vancomycin (VANCOCIN) IVPB 750 mg/150 ml premix     750 mg 150 mL/hr over 60 Minutes Intravenous Every 12 hours 02/11/15 0758     02/11/15 0600  ceFEPIme (MAXIPIME) 2 g in dextrose 5 % 50 mL IVPB  Status:  Discontinued     2 g 100 mL/hr over 30 Minutes Intravenous 3 times per day 02/10/15 2038 02/10/15 2310   02/11/15 0600  ceFEPIme (MAXIPIME) 2 g in dextrose 5 % 50 mL IVPB     2 g 100 mL/hr over 30 Minutes Intravenous 3 times per day 02/10/15 2320     02/10/15 2315  imipenem-cilastatin (PRIMAXIN) 500 mg in sodium chloride 0.9 % 100 mL IVPB  Status:  Discontinued     500 mg 200 mL/hr over  30 Minutes Intravenous 3 times per day 02/10/15 2311 02/10/15 2320   02/10/15 2130  vancomycin (VANCOCIN) 1,500 mg in sodium chloride 0.9 % 500 mL IVPB     1,500 mg 250 mL/hr over 120 Minutes Intravenous  Once 02/10/15 2033 02/10/15 2328   02/10/15 2045  ceFEPIme (MAXIPIME) 2 g in dextrose 5 % 50 mL IVPB     2 g 100 mL/hr over 30 Minutes Intravenous STAT 02/10/15 2037 02/10/15 2128      Subjective:   Manuel [name] seen and examined today.  Patient states he is feeling better. Denies any chest pain, shortness of breath, abdominal pain, nausea, vomiting, headache or dizziness.  Complains of back pain feels it is due to granix  Objective:   Filed Vitals:   02/12/15 2015 02/13/15 0528 02/13/15 1305 02/13/15 1325  BP: 127/54 117/57 117/54 126/63  Pulse: 86 63 76 73  Temp: 98.1 F (36.7 C) 98.7 F (37.1 C) 97.7 F (36.5 C) 97.7 F (36.5 C)  TempSrc: Oral Oral Oral Axillary  Resp: 18 16 16 22   Height:      Weight:      SpO2: 100% 100% 100% 100%    Wt Readings from Last 3 Encounters:  02/10/15 91.717 kg (202 lb 3.2 oz)  02/05/15 92.942 kg (204 lb 14.4 oz)  01/29/15 94.802 kg (209 lb)     Intake/Output Summary (Last 24 hours) at 02/13/15 1408 Last data filed at 02/13/15 1316  Gross per 24 hour  Intake 4154.25 ml  Output    600 ml  Net 3554.25 ml    Exam  General: Well developed, well nourished, NAD  HEENT: NCAT, mucous membranes moist.   Cardiovascular: S1 S2 auscultated, no murmurs, RRR  Respiratory: Clear to auscultation bilaterally with equal chest rise  Abdomen: Soft, nontender, nondistended, + bowel sounds  Extremities: warm dry without cyanosis clubbing or edema  Neuro: AAOx3, nonfocal  Data Review   Micro Results Recent Results (from the past 240 hour(s))  Culture, blood (routine x 2)     Status: None (Preliminary result)   Collection Time: 02/10/15  6:43 PM  Result Value Ref Range Status   Specimen Description BLOOD RIGHT ARM  Final   Special  Requests BOTTLES DRAWN AEROBIC AND ANAEROBIC 5CC EA  Final   Culture   Final    NO GROWTH 3 DAYS Performed at [facility]    Report Status PENDING  Incomplete  Culture, blood (routine x 2)     Status: None (Preliminary result)   Collection Time: 02/10/15  6:58 PM  Result Value Ref Range Status   Specimen Description  BLOOD RIGHT ARM  Final   Special Requests BOTTLES DRAWN AEROBIC AND ANAEROBIC 5CC  Final   Culture   Final    NO GROWTH 3 DAYS Performed at [facility]    Report Status PENDING  Incomplete  Urine culture     Status: None   Collection Time: 02/10/15  9:30 PM  Result Value Ref Range Status   Specimen Description URINE, CLEAN CATCH  Final   Special Requests NONE  Final   Culture   Final    MULTIPLE SPECIES PRESENT, SUGGEST RECOLLECTION IF CLINICALLY INDICATED Performed at [facility]    Report Status 02/12/2015 FINAL  Final    Radiology Reports Dg Chest 2 View  02/10/2015   CLINICAL DATA:  74 year old male with lymphoma and fever.  EXAM: CHEST  2 VIEW  COMPARISON:  CT dated 01/25/2015 and chest radiograph dated 08/19/2014  FINDINGS: Right pectoral Port-A-Cath with tip over central SVC. Two views of the chest demonstrate mild emphysematous changes of the lungs. No focal consolidation, pleural effusion, or pneumothorax. The cardiomediastinal silhouette is within normal limits. The osteopenia degenerative changes of the spine.  IMPRESSION: No focal consolidation.   Electronically Signed   By: Anner Crete M.D.   On: 02/10/2015 19:37   Ct Chest W Contrast  01/25/2015   CLINICAL DATA:  Mantle cell lymphoma  EXAM: CT CHEST, ABDOMEN, AND PELVIS WITH CONTRAST  TECHNIQUE: Multidetector CT imaging of the chest, abdomen and pelvis was performed following the standard protocol during bolus administration of intravenous contrast.  CONTRAST:  134mL OMNIPAQUE IOHEXOL 300 MG/ML  SOLN  COMPARISON:  PET-CT 10/24/2014 and prior CT scan 08/10/2014  FINDINGS: CT CHEST  FINDINGS  Chest wall: No chest wall mass, supraclavicular or axillary adenopathy. A few small scattered lymph nodes are noted. A right-sided Port-A-Cath is in place. The bony thorax is intact. No destructive bone lesions or spinal canal compromise.  Mediastinum: The heart is normal in size. No pericardial effusion. Stable aortic and coronary artery calcifications. The esophagus is grossly normal. There are small scattered mediastinal and hilar lymph nodes but no mass or adenopathy.  Lungs/ pleura: Small scattered subpleural nodules are likely lymph nodes. Most of these appear slightly larger. No infiltrates, edema or effusions.  CT ABDOMEN AND PELVIS FINDINGS  Hepatobiliary: No focal hepatic lesions or intrahepatic biliary dilatation. Gallbladder is normal. No common bile duct dilatation.  Pancreas: No mass, inflammation or ductal dilatation. Stable mild pancreatic atrophy and slightly prominent main pancreatic duct.  Spleen: Persistent splenomegaly. The spleen measures 18.2 x 18.1 x 10.8 Cm. Splenic volume is 1778 cubic cm. This represents a significant increase in size since the PET-CT in March. Small peripheral splenic infarcts are noted.  Adrenals/Urinary Tract: The adrenal glands and kidneys are unremarkable and stable.  Stomach/Bowel: The stomach, duodenum, small bowel and colon are unremarkable. No inflammatory changes, mass lesions or obstructive findings. Moderate to advanced diverticulosis involving the sigmoid colon but no findings for acute diverticulitis. The terminal ileum is normal. The appendix is normal.  Vascular/Lymphatic: Periportal and celiac axis adenopathy. Index node on image number 62 measures 20 mm. This measured 18 mm on the CT scan from 08/10/2014 and 12 mm on the previous PET-CT. Hepatic duodenum ligament lymph node on image number 66 measures 17 mm in was 7 mm on the prior PET-CT. Numerous other lymph nodes have enlarged since the prior PET-CT. Small scattered retroperitoneal lymph  nodes. The aorta demonstrates stable atherosclerotic disease. The major venous structures are patent.  Very prominent splenic vein.  Other: The bladder, prostate gland and seminal vesicles are unremarkable. No pelvic mass or adenopathy. No free pelvic fluid collections. No inguinal mass or adenopathy.  Musculoskeletal: No significant bony findings. Stable advanced degenerative changes involving the spine and bilateral pars defects at L5.  IMPRESSION: Recurrent splenomegaly and upper abdominal lymphadenopathy suggesting recurrent lymphoma. This study looks very similar to the CT scan from 08/10/2014.   Electronically Signed   By: Marijo Sanes M.D.   On: 01/25/2015 12:44   Ct Abdomen Pelvis W Contrast  01/25/2015   CLINICAL DATA:  Mantle cell lymphoma  EXAM: CT CHEST, ABDOMEN, AND PELVIS WITH CONTRAST  TECHNIQUE: Multidetector CT imaging of the chest, abdomen and pelvis was performed following the standard protocol during bolus administration of intravenous contrast.  CONTRAST:  115mL OMNIPAQUE IOHEXOL 300 MG/ML  SOLN  COMPARISON:  PET-CT 10/24/2014 and prior CT scan 08/10/2014  FINDINGS: CT CHEST FINDINGS  Chest wall: No chest wall mass, supraclavicular or axillary adenopathy. A few small scattered lymph nodes are noted. A right-sided Port-A-Cath is in place. The bony thorax is intact. No destructive bone lesions or spinal canal compromise.  Mediastinum: The heart is normal in size. No pericardial effusion. Stable aortic and coronary artery calcifications. The esophagus is grossly normal. There are small scattered mediastinal and hilar lymph nodes but no mass or adenopathy.  Lungs/ pleura: Small scattered subpleural nodules are likely lymph nodes. Most of these appear slightly larger. No infiltrates, edema or effusions.  CT ABDOMEN AND PELVIS FINDINGS  Hepatobiliary: No focal hepatic lesions or intrahepatic biliary dilatation. Gallbladder is normal. No common bile duct dilatation.  Pancreas: No mass, inflammation  or ductal dilatation. Stable mild pancreatic atrophy and slightly prominent main pancreatic duct.  Spleen: Persistent splenomegaly. The spleen measures 18.2 x 18.1 x 10.8 Cm. Splenic volume is 1778 cubic cm. This represents a significant increase in size since the PET-CT in March. Small peripheral splenic infarcts are noted.  Adrenals/Urinary Tract: The adrenal glands and kidneys are unremarkable and stable.  Stomach/Bowel: The stomach, duodenum, small bowel and colon are unremarkable. No inflammatory changes, mass lesions or obstructive findings. Moderate to advanced diverticulosis involving the sigmoid colon but no findings for acute diverticulitis. The terminal ileum is normal. The appendix is normal.  Vascular/Lymphatic: Periportal and celiac axis adenopathy. Index node on image number 62 measures 20 mm. This measured 18 mm on the CT scan from 08/10/2014 and 12 mm on the previous PET-CT. Hepatic duodenum ligament lymph node on image number 66 measures 17 mm in was 7 mm on the prior PET-CT. Numerous other lymph nodes have enlarged since the prior PET-CT. Small scattered retroperitoneal lymph nodes. The aorta demonstrates stable atherosclerotic disease. The major venous structures are patent. Very prominent splenic vein.  Other: The bladder, prostate gland and seminal vesicles are unremarkable. No pelvic mass or adenopathy. No free pelvic fluid collections. No inguinal mass or adenopathy.  Musculoskeletal: No significant bony findings. Stable advanced degenerative changes involving the spine and bilateral pars defects at L5.  IMPRESSION: Recurrent splenomegaly and upper abdominal lymphadenopathy suggesting recurrent lymphoma. This study looks very similar to the CT scan from 08/10/2014.   Electronically Signed   By: Marijo Sanes M.D.   On: 01/25/2015 12:44    CBC  Recent Labs Lab 02/10/15 1843 02/11/15 0454 02/12/15 0449 02/13/15 0520  WBC 0.4* 0.4* 0.7* 1.8*  HGB 8.6* 8.3* 7.7* 7.6*  HCT 25.5* 24.6*  22.9* 23.0*  PLT 22* 19* 36* 51*  MCV 90.7 91.4 89.5 90.9  MCH 30.6 30.9 30.1 30.0  MCHC 33.7 33.7 33.6 33.0  RDW 14.6 14.9 14.7 14.9  LYMPHSABS 0.2*  --  0.2* 0.3*  MONOABS 0.1  --  0.1 0.3  EOSABS 0.1  --  0.1 0.1  BASOSABS 0.0  --  0.0 0.0    Chemistries   Recent Labs Lab 02/10/15 1843 02/11/15 0454 02/13/15 0520  NA 135 136  --   K 3.9 4.3  --   CL 104 106  --   CO2 23 24  --   GLUCOSE 133* 108*  --   BUN 19 18  --   CREATININE 1.23 1.35* 1.18  CALCIUM 7.9* 7.7*  --   AST 16  --   --   ALT 43  --   --   ALKPHOS 76  --   --   BILITOT 0.7  --   --    ------------------------------------------------------------------------------------------------------------------ estimated creatinine clearance is 66.6 mL/min (by C-G formula based on Cr of 1.18). ------------------------------------------------------------------------------------------------------------------ No results for input(s): HGBA1C in the last 72 hours. ------------------------------------------------------------------------------------------------------------------ No results for input(s): CHOL, HDL, LDLCALC, TRIG, CHOLHDL, LDLDIRECT in the last 72 hours. ------------------------------------------------------------------------------------------------------------------ No results for input(s): TSH, T4TOTAL, T3FREE, THYROIDAB in the last 72 hours.  Invalid input(s): FREET3 ------------------------------------------------------------------------------------------------------------------ No results for input(s): VITAMINB12, FOLATE, FERRITIN, TIBC, IRON, RETICCTPCT in the last 72 hours.  Coagulation profile  Recent Labs Lab 02/10/15 1922  INR 1.10    No results for input(s): DDIMER in the last 72 hours.  Cardiac Enzymes No results for input(s): CKMB, TROPONINI, MYOGLOBIN in the last 168 hours.  Invalid input(s):  CK ------------------------------------------------------------------------------------------------------------------ Invalid input(s): POCBNP    Shaunae Sieloff D.O. on 02/13/2015 at 2:08 PM  Between 7am to 7pm - Pager - [phone]  After 7pm go to [email or web address] - password [email or web address]  And look for the night coverage person covering for me after hours  [facility] Office  [phone]

## 2015-02-13 NOTE — Plan of Care (Signed)
Problem: Phase II Progression Outcomes Goal: IV changed to normal saline lock Outcome: Not Progressing Fluids still going.

## 2015-02-13 NOTE — Progress Notes (Signed)
Manuel [name]   DOB:[birth year]   YQ#:[id number]   XBM#:[id number]  Patient Care Team: Pcp Not In System as PCP - [place], MD as Referring Physician (Hematology and Oncology)  I have seen the patient, examined him and edited the notes as follows  Subjective: Patient seen and examined. He is afebrile. He denies any chills but reported night sweats overnight. Denies vision changes, or mucositis. He denies signs and symptoms of anemia such as dizziness, chest pain or shortness of breath. Denies lower extremity swelling. Denies nausea, heartburn. Diarrhea resolved. Last bowel movement on 7/11.  Appetite is decreased. Denies any dysuria. Denies abnormal skin rashes, or neuropathy. Denies any bleeding issues such as epistaxis, hematemesis, hematuria or hematochezia. Has not been ambulating. Main complaints is back pain and intermittent headaches after administration of Granix; At this time, back pain is improving at this time and headache has resolved.  Scheduled Meds: . ceFEPime (MAXIPIME) IV  2 g Intravenous 3 times per day  . levothyroxine  75 mcg Oral QAC breakfast  . lidocaine-prilocaine   Topical Once  . sodium chloride  3 mL Intravenous Q12H  . Tbo-filgastrim (GRANIX) SQ  480 mcg Subcutaneous q1800  . vancomycin  750 mg Intravenous Q12H   Continuous Infusions: . sodium chloride 75 mL/hr at 02/12/15 2300   PRN Meds:.acetaminophen **OR** acetaminophen, albuterol, HYDROcodone-acetaminophen, LORazepam, morphine injection, ondansetron **OR** ondansetron (ZOFRAN) IV, zolpidem  Objective:  Filed Vitals:   02/13/15 0528  BP: 117/57  Pulse: 63  Temp: 98.7 F (37.1 C)  Resp: 16      Intake/Output Summary (Last 24 hours) at 02/13/15 0727 Last data filed at 02/13/15 0548  Gross per 24 hour  Intake 3751.25 ml  Output    900 ml  Net 2851.25 ml     GENERAL:alert, no distress and uncomfortable due to night sweats, mild back pain  SKIN: skin color is pale, texture, turgor  are normal, no rashes or significant lesions EYES: normal, conjunctiva are pale and non-injected, sclera clear OROPHARYNX:no exudate, no erythema and lips, buccal mucosa, and tongue normal  NECK: supple, thyroid normal size, non-tender, without nodularity LYMPH: no palpable lymphadenopathy in the cervical, axillary or inguinal areas. LUNGS: clear to auscultation and percussion with normal breathing effort HEART: regular rate & rhythm and no murmurs and no lower extremity edema ABDOMEN: soft, non-tender and active bowel sounds. Palpable splenomegaly. Musculoskeletal:no cyanosis of digits and no clubbing. No spinal tenderness PSYCH: alert & oriented x 3 with fluent speech NEURO: no focal motor/sensory deficits     Labs:   Recent Labs Lab 02/10/15 1843 02/11/15 0454 02/12/15 0449 02/13/15 0520  WBC 0.4* 0.4* 0.7* 1.8*  HGB 8.6* 8.3* 7.7* 7.6*  HCT 25.5* 24.6* 22.9* 23.0*  PLT 22* 19* 36* 51*  MCV 90.7 91.4 89.5 90.9  MCH 30.6 30.9 30.1 30.0  MCHC 33.7 33.7 33.6 33.0  RDW 14.6 14.9 14.7 14.9  LYMPHSABS 0.2*  --  0.2* 0.3*  MONOABS 0.1  --  0.1 0.3  EOSABS 0.1  --  0.1 0.1  BASOSABS 0.0  --  0.0 0.0     Chemistries:    Recent Labs Lab 02/10/15 1843 02/11/15 0454 02/13/15 0520  NA 135 136  --   K 3.9 4.3  --   CL 104 106  --   CO2 23 24  --   GLUCOSE 133* 108*  --   BUN 19 18  --   CREATININE 1.23 1.35* 1.18  CALCIUM 7.9* 7.7*  --  AST 16  --   --   ALT 43  --   --   ALKPHOS 76  --   --   BILITOT 0.7  --   --     GFR Estimated Creatinine Clearance: 66.6 mL/min (by C-G formula based on Cr of 1.18).  Liver Function Tests:  Recent Labs Lab 02/10/15 1843  AST 16  ALT 43  ALKPHOS 76  BILITOT 0.7  PROT 5.7*  ALBUMIN 3.0*    Recent Labs Lab 02/10/15 1922  LIPASE 12*    on profile  Recent Labs Lab 02/10/15 1922  INR 1.10   Microbiology Cultures negative to date   Imaging Studies:  None today  Assessment/Plan: 74 y.o.  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 He is s/p C1D1 chemo with R-EPOCH on 6/27. Rituxan was dose modified due to nausea. Continue supportive care for now  Febrile Neutropenia Likely due to recent chemotherapy Cultures are negative to date Continue antipyretics, IV antibiotics, IVF. Clinically he is improving Fever has resolved. Patient refused Neulasta in the past due to musculoskeletal pain. Discussed with the patient the risk, benefit, side effects of G-CSF with Granix which is not the same as Neulasta. He agreed to proceed He was started on Granix 480 g on 7/11 with good response, today's ANC is 1.1 (for a WBC 1.8).  Continue Granix daily until [place] is greater than 1500, likely 1 more dose Check differential daily With his bone pain, he is fairly reluctant to proceed with Granix After much discussion he is in agreement to get 1 more dose  Anemia in neoplastic disease This is likely anemia of chronic disease and underlying disease, recent chemotherapy.  The patient denies recent history of bleeding such as epistaxis, hematuria or hematochezia.  Hb today is 7.6, and he is symptomatic with fatigue.  We discussed some of the risks, benefits, and alternatives of blood transfusions. The patient is symptomatic from anemia and the hemoglobin level is critically low.  Some of the side-effects to be expected including risks of transfusion reactions, chills, infection, syndrome of volume overload and risk of hospitalization from various reasons and the patient is willing to proceed and went ahead to sign consent today. I will transfuse 1 unit of blood.   Risk of tumor lysis Continue on allopurinol daily.  Essential hypertension He has started to become hypertensive after Hydrochlorothiazide was discontinued. He has intermittent headaches, but they resolve rapidly. Plan to observe for  now.  Thrombocytopenia This is due to splenomegaly and relapsed disease, recent chemo as well as acute illness The patient denies any recent signs or symptoms of bleeding such as spontaneous epistaxis, hematuria or hematochezia. Aspirin is on hold His current platelet has improved from 36 k to 51k today  No transfusion is indicated at this time  Neuropathy due to chemotherapeutic drug This is related to side effects of chemotherapy. Plan to reduce the dose of vincristine in the future.  Back pain Patient relates symptoms to Granix, but lack of mobility/ prolonged bed rest could add to the symptoms Currently his back pain is resolving He may need one more dose of Granix to reach therapeutic ANC, he agrees to proceed In the interim, provide supportive measures and consider K pad for comfort. Increase mobility  Malnutrition Appreciate Nutrition follow up  DVT prophylaxis On mechanical devices  Full Code  Discharge planning He is not safe to  be discharged unless [place] is greater than 1500 without any further fevers Hopefully he can go home tomorrow. Other medical issues as per admitting team   Rondel Jumbo, PA-C 02/13/2015  7:27 AM Ajanae Virag, MD 02/13/2015

## 2015-02-14 ENCOUNTER — Other Ambulatory Visit: Payer: Self-pay | Admitting: Hematology and Oncology

## 2015-02-14 DIAGNOSIS — E039 Hypothyroidism, unspecified: Secondary | ICD-10-CM

## 2015-02-14 DIAGNOSIS — D696 Thrombocytopenia, unspecified: Secondary | ICD-10-CM

## 2015-02-14 LAB — CBC WITH DIFFERENTIAL/PLATELET
BASOS PCT: 0 % (ref 0–1)
Basophils Absolute: 0 10*3/uL (ref 0.0–0.1)
EOS PCT: 4 % (ref 0–5)
Eosinophils Absolute: 0.2 10*3/uL (ref 0.0–0.7)
HEMATOCRIT: 23.7 % — AB (ref 39.0–52.0)
HEMOGLOBIN: 7.9 g/dL — AB (ref 13.0–17.0)
LYMPHS ABS: 0.3 10*3/uL — AB (ref 0.7–4.0)
Lymphocytes Relative: 8 % — ABNORMAL LOW (ref 12–46)
MCH: 29.6 pg (ref 26.0–34.0)
MCHC: 33.3 g/dL (ref 30.0–36.0)
MCV: 88.8 fL (ref 78.0–100.0)
MONOS PCT: 14 % — AB (ref 3–12)
Monocytes Absolute: 0.6 10*3/uL (ref 0.1–1.0)
NEUTROS ABS: 2.9 10*3/uL (ref 1.7–7.7)
NEUTROS PCT: 74 % (ref 43–77)
PLATELETS: 64 10*3/uL — AB (ref 150–400)
RBC: 2.67 MIL/uL — AB (ref 4.22–5.81)
RDW: 15.3 % (ref 11.5–15.5)
WBC: 4 10*3/uL (ref 4.0–10.5)

## 2015-02-14 LAB — TYPE AND SCREEN
ABO/RH(D): O POS
ANTIBODY SCREEN: NEGATIVE
Unit division: 0

## 2015-02-14 LAB — PREPARE RBC (CROSSMATCH)

## 2015-02-14 MED ORDER — HEPARIN SOD (PORK) LOCK FLUSH 100 UNIT/ML IV SOLN
500.0000 [IU] | INTRAVENOUS | Status: AC | PRN
  Administered 2015-02-14: 500 [IU]

## 2015-02-14 MED ORDER — LEVOFLOXACIN 750 MG PO TABS: 750.0000 mg | ORAL_TABLET | Freq: Every day | ORAL | Status: DC

## 2015-02-14 NOTE — Discharge Summary (Signed)
Physician Discharge Summary  Manuel [name] SWF:[id number] DOB: 1940-11-30 DOA: 02/10/2015  PCP: Pcp Not In System  Admit date: 02/10/2015 Discharge date: 02/14/2015  Time spent: 45 minutes  Recommendations for Outpatient Follow-up:  Patient will be discharged to home.  Patient will need to follow up with primary care provider or Dr. Alvy Bimler within one week of discharge and have repeat CBC.  Patient should continue medications as prescribed.  Patient should follow a heart healthy diet.   Discharge Diagnoses:  Neutropenic fever Malnutrition of moderate atrophy Mantle cell lymphoma Hypothyroidism Anemia and neoplastic disease Thrombocytopenia Neuropathy Back pain  Discharge Condition: Stable  Diet recommendation: Heart healthy  Filed Weights   02/10/15 2315  Weight: 91.717 kg (202 lb 3.2 oz)    History of present illness:  on 02/10/2015 by Dr. Merton Border  Manuel [name] is a 74 y.o. 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 The patient had good breakfast today and his power of attorney, his daughter noted a fever of 102.2 and was advised to come to the emergency room. Patient denies any shortness of breath cough or urinary symptoms. He is sitting in bed with shaking chills. Denies any headaches or loss of consciousness . In the emergency room he was noted to be febrile with a white blood cell count of 0.4.  Hospital Course:  Neutropenic Fever -Initially placed on vancomycin and cefepime -Remains afebrile for the past 48 hours -Oncology consulted and appreciated, recommended ANC be over 1.5 before discharge -Was placed on Granix -WBC 4.0, ANC today 2.9 -repeat CBC in 1 week. -Spoke with Dr. Alvy Bimler, will continue anti-probiotics for additional 3 days. Will place patient on  Levaquin.  Malnutrition of moderate degree -Continue heart healthy diet  Mantle cell lymphoma -Currently receiving R CHOP -Followed by Dr. Alvy Bimler, oncology -Continue allopurinol for risk of tumor lysis  Hypothyroidism -Continue Synthroid  Anemia in neoplastic disease -I be secondary to recent chemotherapy -Hemoglobin currently 7.9 -Oncology ordered blood transfusion -Patient will follow up with Dr. Alvy Bimler for repeat CBC in 1 week.  Thrombocytopenia -Likely due to chemotherapy versus acute illness -Platelets currently 64 -Aspirin held -No signs of bleeding  Neuropathy -Likely due to chemotherapy -Oncology plans to reduce vincristine  Back pain -Improving, likely secondary to Granix -Feels his back pain is better this morning -Continue heating pad and pain control  Procedures  None  Consults  Oncology  Discharge Exam: Filed Vitals:   02/14/15 0525  BP: 118/59  Pulse: 72  Temp: 98.2 F (36.8 C)  Resp: 18    Exam  General: Well developed, well nourished, NAD  HEENT: NCAT, mucous membranes moist.   Cardiovascular: S1 S2 auscultated, no murmurs, RRR  Respiratory: Clear to auscultation bilaterally with equal chest rise  Abdomen: Soft, nontender, nondistended, + bowel sounds  Extremities: warm dry without cyanosis clubbing or edema  Neuro: AAOx3, nonfocal  Psych: Normal affect and demeanor with intact judgement and insight  Discharge Instructions      Discharge Instructions    Discharge instructions    Complete by:  As directed   Patient will be discharged to home.  Patient will need to follow up with primary care provider or Dr. Alvy Bimler within one week of discharge and have repeat CBC.  Patient should continue medications as prescribed.  Patient should follow a heart healthy  diet.            Medication List    TAKE these medications        acetaminophen 500 MG tablet  Commonly known as:  TYLENOL  Take 500 mg by mouth every 6 (six)  hours as needed for moderate pain or fever.     allopurinol 300 MG tablet  Commonly known as:  ZYLOPRIM  Take 1 tablet (300 mg total) by mouth daily.     cholecalciferol 1000 UNITS tablet  Commonly known as:  VITAMIN D  Take 2,000 Units by mouth every morning.     dexamethasone 4 MG tablet  Commonly known as:  DECADRON  Take 2 tablets (8 mg total) by mouth 2 (two) times daily with a meal. Take two times a day starting the day after chemotherapy for 3 days.     levofloxacin 750 MG tablet  Commonly known as:  LEVAQUIN  Take 1 tablet (750 mg total) by mouth daily.     levothyroxine 75 MCG tablet  Commonly known as:  SYNTHROID, LEVOTHROID  TAKE 1 TABLET EVERY DAY     loratadine 10 MG tablet  Commonly known as:  CLARITIN  Take 10 mg by mouth every morning.     LORazepam 0.5 MG tablet  Commonly known as:  ATIVAN  Take 1 tablet (0.5 mg total) by mouth every 6 (six) hours as needed (Nausea or vomiting).     Multiple Vitamin tablet  Take 1 tablet by mouth daily.     ondansetron 8 MG tablet  Commonly known as:  ZOFRAN  Take 1 tablet (8 mg total) by mouth 2 (two) times daily. Take two times a day starting the day after chemo for 3 days. Then take two times a day as needed for nausea or vomiting.     PROBIOTIC PO  Take 1 tablet by mouth daily.     prochlorperazine 10 MG tablet  Commonly known as:  COMPAZINE  Take 1 tablet (10 mg total) by mouth every 6 (six) hours as needed (Nausea or vomiting).     prochlorperazine 25 MG suppository  Commonly known as:  COMPAZINE  Place 1 suppository (25 mg total) rectally every 12 (twelve) hours as needed for nausea.       No Known Allergies Follow-up Information    Follow up with [facility], NI, MD. Schedule an appointment as soon as possible for a visit in 1 week.   Specialty:  Hematology and Oncology   Why:  Hospital follow up, Repeat CBC   Contact information:   [place] [id number] [phone]        The  results of significant diagnostics from this hospitalization (including imaging, microbiology, ancillary and laboratory) are listed below for reference.    Significant Diagnostic Studies: Dg Chest 2 View  02/10/2015   CLINICAL DATA:  74 year old male with lymphoma and fever.  EXAM: CHEST  2 VIEW  COMPARISON:  CT dated 01/25/2015 and chest radiograph dated 08/19/2014  FINDINGS: Right pectoral Port-A-Cath with tip over central SVC. Two views of the chest demonstrate mild emphysematous changes of the lungs. No focal consolidation, pleural effusion, or pneumothorax. The cardiomediastinal silhouette is within normal limits. The osteopenia degenerative changes of the spine.  IMPRESSION: No focal consolidation.   Electronically Signed   By: Anner Crete M.D.   On: 02/10/2015 19:37   Ct Chest W Contrast  01/25/2015   CLINICAL DATA:  Mantle cell lymphoma  EXAM: CT CHEST, ABDOMEN,  AND PELVIS WITH CONTRAST  TECHNIQUE: Multidetector CT imaging of the chest, abdomen and pelvis was performed following the standard protocol during bolus administration of intravenous contrast.  CONTRAST:  14mL OMNIPAQUE IOHEXOL 300 MG/ML  SOLN  COMPARISON:  PET-CT 10/24/2014 and prior CT scan 08/10/2014  FINDINGS: CT CHEST FINDINGS  Chest wall: No chest wall mass, supraclavicular or axillary adenopathy. A few small scattered lymph nodes are noted. A right-sided Port-A-Cath is in place. The bony thorax is intact. No destructive bone lesions or spinal canal compromise.  Mediastinum: The heart is normal in size. No pericardial effusion. Stable aortic and coronary artery calcifications. The esophagus is grossly normal. There are small scattered mediastinal and hilar lymph nodes but no mass or adenopathy.  Lungs/ pleura: Small scattered subpleural nodules are likely lymph nodes. Most of these appear slightly larger. No infiltrates, edema or effusions.  CT ABDOMEN AND PELVIS FINDINGS  Hepatobiliary: No focal hepatic lesions or intrahepatic  biliary dilatation. Gallbladder is normal. No common bile duct dilatation.  Pancreas: No mass, inflammation or ductal dilatation. Stable mild pancreatic atrophy and slightly prominent main pancreatic duct.  Spleen: Persistent splenomegaly. The spleen measures 18.2 x 18.1 x 10.8 Cm. Splenic volume is 1778 cubic cm. This represents a significant increase in size since the PET-CT in March. Small peripheral splenic infarcts are noted.  Adrenals/Urinary Tract: The adrenal glands and kidneys are unremarkable and stable.  Stomach/Bowel: The stomach, duodenum, small bowel and colon are unremarkable. No inflammatory changes, mass lesions or obstructive findings. Moderate to advanced diverticulosis involving the sigmoid colon but no findings for acute diverticulitis. The terminal ileum is normal. The appendix is normal.  Vascular/Lymphatic: Periportal and celiac axis adenopathy. Index node on image number 62 measures 20 mm. This measured 18 mm on the CT scan from 08/10/2014 and 12 mm on the previous PET-CT. Hepatic duodenum ligament lymph node on image number 66 measures 17 mm in was 7 mm on the prior PET-CT. Numerous other lymph nodes have enlarged since the prior PET-CT. Small scattered retroperitoneal lymph nodes. The aorta demonstrates stable atherosclerotic disease. The major venous structures are patent. Very prominent splenic vein.  Other: The bladder, prostate gland and seminal vesicles are unremarkable. No pelvic mass or adenopathy. No free pelvic fluid collections. No inguinal mass or adenopathy.  Musculoskeletal: No significant bony findings. Stable advanced degenerative changes involving the spine and bilateral pars defects at L5.  IMPRESSION: Recurrent splenomegaly and upper abdominal lymphadenopathy suggesting recurrent lymphoma. This study looks very similar to the CT scan from 08/10/2014.   Electronically Signed   By: Marijo Sanes M.D.   On: 01/25/2015 12:44   Ct Abdomen Pelvis W Contrast  01/25/2015    CLINICAL DATA:  Mantle cell lymphoma  EXAM: CT CHEST, ABDOMEN, AND PELVIS WITH CONTRAST  TECHNIQUE: Multidetector CT imaging of the chest, abdomen and pelvis was performed following the standard protocol during bolus administration of intravenous contrast.  CONTRAST:  18mL OMNIPAQUE IOHEXOL 300 MG/ML  SOLN  COMPARISON:  PET-CT 10/24/2014 and prior CT scan 08/10/2014  FINDINGS: CT CHEST FINDINGS  Chest wall: No chest wall mass, supraclavicular or axillary adenopathy. A few small scattered lymph nodes are noted. A right-sided Port-A-Cath is in place. The bony thorax is intact. No destructive bone lesions or spinal canal compromise.  Mediastinum: The heart is normal in size. No pericardial effusion. Stable aortic and coronary artery calcifications. The esophagus is grossly normal. There are small scattered mediastinal and hilar lymph nodes but no mass or adenopathy.  Lungs/  pleura: Small scattered subpleural nodules are likely lymph nodes. Most of these appear slightly larger. No infiltrates, edema or effusions.  CT ABDOMEN AND PELVIS FINDINGS  Hepatobiliary: No focal hepatic lesions or intrahepatic biliary dilatation. Gallbladder is normal. No common bile duct dilatation.  Pancreas: No mass, inflammation or ductal dilatation. Stable mild pancreatic atrophy and slightly prominent main pancreatic duct.  Spleen: Persistent splenomegaly. The spleen measures 18.2 x 18.1 x 10.8 Cm. Splenic volume is 1778 cubic cm. This represents a significant increase in size since the PET-CT in March. Small peripheral splenic infarcts are noted.  Adrenals/Urinary Tract: The adrenal glands and kidneys are unremarkable and stable.  Stomach/Bowel: The stomach, duodenum, small bowel and colon are unremarkable. No inflammatory changes, mass lesions or obstructive findings. Moderate to advanced diverticulosis involving the sigmoid colon but no findings for acute diverticulitis. The terminal ileum is normal. The appendix is normal.   Vascular/Lymphatic: Periportal and celiac axis adenopathy. Index node on image number 62 measures 20 mm. This measured 18 mm on the CT scan from 08/10/2014 and 12 mm on the previous PET-CT. Hepatic duodenum ligament lymph node on image number 66 measures 17 mm in was 7 mm on the prior PET-CT. Numerous other lymph nodes have enlarged since the prior PET-CT. Small scattered retroperitoneal lymph nodes. The aorta demonstrates stable atherosclerotic disease. The major venous structures are patent. Very prominent splenic vein.  Other: The bladder, prostate gland and seminal vesicles are unremarkable. No pelvic mass or adenopathy. No free pelvic fluid collections. No inguinal mass or adenopathy.  Musculoskeletal: No significant bony findings. Stable advanced degenerative changes involving the spine and bilateral pars defects at L5.  IMPRESSION: Recurrent splenomegaly and upper abdominal lymphadenopathy suggesting recurrent lymphoma. This study looks very similar to the CT scan from 08/10/2014.   Electronically Signed   By: Marijo Sanes M.D.   On: 01/25/2015 12:44    Microbiology: Recent Results (from the past 240 hour(s))  Culture, blood (routine x 2)     Status: None (Preliminary result)   Collection Time: 02/10/15  6:43 PM  Result Value Ref Range Status   Specimen Description BLOOD RIGHT ARM  Final   Special Requests BOTTLES DRAWN AEROBIC AND ANAEROBIC 5CC EA  Final   Culture   Final    NO GROWTH 3 DAYS Performed at [facility]    Report Status PENDING  Incomplete  Culture, blood (routine x 2)     Status: None (Preliminary result)   Collection Time: 02/10/15  6:58 PM  Result Value Ref Range Status   Specimen Description BLOOD RIGHT ARM  Final   Special Requests BOTTLES DRAWN AEROBIC AND ANAEROBIC 5CC  Final   Culture   Final    NO GROWTH 3 DAYS Performed at [facility]    Report Status PENDING  Incomplete  Urine culture     Status: None   Collection Time: 02/10/15  9:30 PM    Result Value Ref Range Status   Specimen Description URINE, CLEAN CATCH  Final   Special Requests NONE  Final   Culture   Final    MULTIPLE SPECIES PRESENT, SUGGEST RECOLLECTION IF CLINICALLY INDICATED Performed at [facility]    Report Status 02/12/2015 FINAL  Final     Labs: Basic Metabolic Panel:  Recent Labs Lab 02/10/15 1843 02/11/15 0454 02/13/15 0520  NA 135 136  --   K 3.9 4.3  --   CL 104 106  --   CO2 23 24  --  GLUCOSE 133* 108*  --   BUN 19 18  --   CREATININE 1.23 1.35* 1.18  CALCIUM 7.9* 7.7*  --    Liver Function Tests:  Recent Labs Lab 02/10/15 1843  AST 16  ALT 43  ALKPHOS 76  BILITOT 0.7  PROT 5.7*  ALBUMIN 3.0*    Recent Labs Lab 02/10/15 1922  LIPASE 12*   No results for input(s): AMMONIA in the last 168 hours. CBC:  Recent Labs Lab 02/10/15 1843 02/11/15 0454 02/12/15 0449 02/13/15 0520 02/14/15 0440  WBC 0.4* 0.4* 0.7* 1.8* 4.0  NEUTROABS 0.0*  --  0.3* 1.1* 2.9  HGB 8.6* 8.3* 7.7* 7.6* 7.9*  HCT 25.5* 24.6* 22.9* 23.0* 23.7*  MCV 90.7 91.4 89.5 90.9 88.8  PLT 22* 19* 36* 51* 64*   Cardiac Enzymes: No results for input(s): CKTOTAL, CKMB, CKMBINDEX, TROPONINI in the last 168 hours. BNP: BNP (last 3 results) No results for input(s): BNP in the last 8760 hours.  ProBNP (last 3 results) No results for input(s): PROBNP in the last 8760 hours.  CBG: No results for input(s): GLUCAP in the last 168 hours.     SignedCristal Ford  [facility] 02/14/2015, 9:34 AM

## 2015-02-14 NOTE — Progress Notes (Signed)
Manuel [name]   DOB:Sep 03, 1940   BO#:[id number]   IDP#:[id number]  Patient Care Team: Pcp Not In System as PCP - Normanna, MD as Referring Physician (Hematology and Oncology)  I have seen the patient, examined him and edited the notes as follows  Subjective: Patient seen and examined. He is afebrile. He is doing well this morning. Denies chills or night sweats. Denies vision changes, or mucositis. He denies signs and symptoms of anemia such as dizziness, chest pain or shortness of breath. Denies lower extremity swelling. Denies nausea, heartburn. Diarrhea resolved. Last bowel movement on 7/11.  Appetite is improving. Denies any dysuria. Denies abnormal skin rashes, or neuropathy. Denies any bleeding issues such as epistaxis, hematemesis, hematuria or hematochezia. Has not been ambulating. Back pain is improving, well controlled with meds.   Scheduled Meds: . ceFEPime (MAXIPIME) IV  2 g Intravenous 3 times per day  . levothyroxine  75 mcg Oral QAC breakfast  . lidocaine-prilocaine   Topical Once  . sodium chloride  3 mL Intravenous Q12H  . Tbo-filgastrim (GRANIX) SQ  480 mcg Subcutaneous q1800  . vancomycin  750 mg Intravenous Q12H   Continuous Infusions: . sodium chloride 75 mL/hr at 02/13/15 1316   PRN Meds:.acetaminophen **OR** acetaminophen, albuterol, HYDROcodone-acetaminophen, LORazepam, morphine injection, ondansetron **OR** ondansetron (ZOFRAN) IV, sodium chloride, zolpidem  Objective:  Filed Vitals:   02/14/15 0525  BP: 118/59  Pulse: 72  Temp: 98.2 F (36.8 C)  Resp: 18      Intake/Output Summary (Last 24 hours) at 02/14/15 0707 Last data filed at 02/13/15 2300  Gross per 24 hour  Intake   2837 ml  Output      0 ml  Net   2837 ml     GENERAL:alert, no distress and comfortable SKIN: skin color is pale, texture, turgor are normal, no rashes or significant lesions EYES: normal, conjunctiva are pale and non-injected, sclera clear OROPHARYNX:no  exudate, no erythema and lips, buccal mucosa, and tongue normal  NECK: supple, thyroid normal size, non-tender, without nodularity LYMPH: no palpable lymphadenopathy in the cervical, axillary or inguinal areas. LUNGS: clear to auscultation and percussion with normal breathing effort HEART: regular rate & rhythm and no murmurs and no lower extremity edema ABDOMEN: soft, non-tender and active bowel sounds. Palpable splenomegaly. Musculoskeletal:no cyanosis of digits and no clubbing. No spinal tenderness PSYCH: alert & oriented x 3 with fluent speech NEURO: no focal motor/sensory deficits     Labs:   Recent Labs Lab 02/10/15 1843 02/11/15 0454 02/12/15 0449 02/13/15 0520 02/14/15 0440  WBC 0.4* 0.4* 0.7* 1.8* 4.0  HGB 8.6* 8.3* 7.7* 7.6* 7.9*  HCT 25.5* 24.6* 22.9* 23.0* 23.7*  PLT 22* 19* 36* 51* 64*  MCV 90.7 91.4 89.5 90.9 88.8  MCH 30.6 30.9 30.1 30.0 29.6  MCHC 33.7 33.7 33.6 33.0 33.3  RDW 14.6 14.9 14.7 14.9 15.3  LYMPHSABS 0.2*  --  0.2* 0.3* 0.3*  MONOABS 0.1  --  0.1 0.3 0.6  EOSABS 0.1  --  0.1 0.1 0.2  BASOSABS 0.0  --  0.0 0.0 0.0     Chemistries:    Recent Labs Lab 02/10/15 1843 02/11/15 0454 02/13/15 0520  NA 135 136  --   K 3.9 4.3  --   CL 104 106  --   CO2 23 24  --   GLUCOSE 133* 108*  --   BUN 19 18  --   CREATININE 1.23 1.35* 1.18  CALCIUM 7.9* 7.7*  --  AST 16  --   --   ALT 43  --   --   ALKPHOS 76  --   --   BILITOT 0.7  --   --     GFR Estimated Creatinine Clearance: 66.6 mL/min (by C-G formula based on Cr of 1.18).  Liver Function Tests:  Recent Labs Lab 02/10/15 1843  AST 16  ALT 43  ALKPHOS 76  BILITOT 0.7  PROT 5.7*  ALBUMIN 3.0*    Recent Labs Lab 02/10/15 1922  LIPASE 12*    on profile  Recent Labs Lab 02/10/15 1922  INR 1.10   Microbiology Cultures negative to date   Imaging Studies:  None today  Assessment/Plan: 74 y.o.  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 He is s/p C1D1 chemo with R-EPOCH on 6/27. Rituxan was dose modified due to nausea. Continue supportive care for now  Febrile Neutropenia Likely due to recent chemotherapy Cultures are negative to date Continue antipyretics, IV antibiotics, IVF. Clinically he is improving Fever has resolved. Patient refused Neulasta in the past due to musculoskeletal pain. Discussed with the patient the risk, benefit, side effects of G-CSF with Granix which is not the same as Neulasta. He agreed to proceed He was started on Granix 480 g on 7/11 with good response, today's ANC is 2.4 (for a WBC 4.0).  Will stop GCSF today I recommend 3 more days of oral anti-biotics after discharge.  Anemia in neoplastic disease This is likely anemia of chronic disease and underlying disease, recent chemotherapy.  The patient denies recent history of bleeding such as epistaxis, hematuria or hematochezia.  He received 1 unit of blood on 7/13 for a Hb 7.6, as the patient was symptomatic. His Hb today is 7.9 Will monitor as outpatient  Risk of tumor lysis Continue on allopurinol daily.  Essential hypertension He has started to become hypertensive after Hydrochlorothiazide was discontinued. He has intermittent headaches, but they resolve rapidly. Plan to observe for now.  Thrombocytopenia This is due to splenomegaly and relapsed disease, recent chemo as well as acute illness The patient denies any recent signs or symptoms of bleeding such as spontaneous epistaxis, hematuria or hematochezia. Aspirin is on hold His current platelet has improved from 51k  To 64k today  No transfusion is indicated at this time  Neuropathy due to chemotherapeutic drug This is related to side effects of chemotherapy. Plan to reduce the dose of vincristine in the future.  Back pain Patient relates symptoms to Granix, but lack of mobility/  prolonged bed rest could add to the symptoms Currently his back pain is resolving  Malnutrition Appreciate Nutrition follow up  DVT prophylaxis On mechanical devices  Full Code  Discharge planning His ANC is greater than 1500 without any further fevers Hopefully he can go home today if ok with admitting team. Other medical issues as per admitting team   [facility], PA-C 02/14/2015  7:07 AM [place], Maanya Hippert, MD 02/14/2015

## 2015-02-14 NOTE — Discharge Instructions (Signed)
Neutropenic Fever °Neutropenic fever is a type of fever that can develop in someone who has a very low number of a certain kind of white blood cells called neutrophils (neutropenia). These blood cells are important for fighting infections caused by bacteria and fungi. When you have neutropenia, you could be in danger of a severe infection. You may need to start taking antibiotic medicines. °CAUSES °Neutrophils are made in the spongy tissue inside your bones (bone marrow). Anything that damages your bone marrow or damages neutrophils after they leave your bone marrow can cause neutropenia. Once you have a dangerously low level of neutrophils, you are at risk for infection and neutropenic fever. °Causes of neutropenia may include: °· Cancer treatments. °· Bone marrow cancer. °· Cancer of the white blood cells (leukemia or myeloma). °· Severe infection. °· Bone marrow failure (aplastic anemia). °· Many types of medicines. °· Diseases of the body's defense system (autoimmune diseases). °· Inherited genes that cause neutropenia. °· Vitamin B deficiency. °· Spleen enlargement in rheumatoid arthritis (Felty syndrome). °SIGNS AND SYMPTOMS °Fever is the main symptom of neutropenic fever. Other signs and symptoms may include: °· Chills. °· Fatigue. °· Painful mouth ulcers. °· Cough. °· Shortness of breath. °· Swollen glands (lymph nodes). °· Sore throat. °· Sinus and ear infections. °· Gum disease. °· Skin infection. °· Burning and frequent urination. °· Rectal infections. °· Vaginal discharge or itching. °DIAGNOSIS  °Your health care provider may diagnose neutropenic fever if your neutrophil count is less than 500 neutrophils per microliter of blood and you have a fever of at least 100.4°F (38.0°C).  °· Blood tests and other tests that measure neutrophils will be done. These may include: °¨ A complete blood count (CBC) and a differential white blood count (WBC). °¨ Peripheral smear. This test involves checking a blood sample  under a microscope. °· Other types of tests may also be done, including: °¨ Chest X-rays. °¨ Cultures of blood and body fluids to look for a source of infection. °Your health care provider will also determine if your neutropenic fever is high risk or low risk. °· You may have high-risk neutropenic fever if: °¨ Your neutrophil count is less than 100 neutrophils per microliter of blood. °¨ You have also been diagnosed with pneumonia or another serious medical problem. °¨ Your condition requires you to be treated in the hospital. °· You may have low-risk neutropenic fever if: °¨ Your neutrophil count is more than 100 neutrophils per microliter of blood. °¨ Your chest X-ray is normal. °¨ You do not have an active illness or any other problems that require you to be in the hospital. °TREATMENT  °You may start treatment as soon as you get diagnosed with neutropenic fever, even if your health care provider is still looking for the source of infection. °· Treatment for high-risk neutropenic fever is antibiotic medicine given through an IV access tube. This is done in the hospital. You may be given a single antibiotic or a combination of antibiotics. °· Low-risk neutropenic fever may be treated at home. You may have to take one or two different oral antibiotics. In some cases, you may need to be treated with IV antibiotics that are given by a home health care provider who visits your home. °· If your health care provider finds a specific cause of infection, you may be switched to the antibiotics that work best against those particular bacteria. °· If a fungal infection is found, your medicine will be changed to an antifungal   medicine. °· If the fever goes away in 3-5 days, you may have to take medicine for about 7 days. If the fever is not responding, you may have to take medicine longer. °· You may have to stop taking any medicine that could be causing neutropenic fever. °· If you have neutropenic fever from cancer  treatment drugs (chemotherapy), you may need to take a type of medicine called white blood cell growth factors. This medicine can help prevent fever. °HOME CARE INSTRUCTIONS °· Only take medicines as directed by your health care provider. °¨ If you are being treated with oral antibiotics at home, you may need to return to your health care provider every day to have your CBC checked. You may have to do this until your fever responds. °¨ Take your antibiotics as directed. Make sure you finish them even if you start to feel better. °· Preventing infection is important when you have neutropenia. Here are some ways to prevent infections: °¨ Avoid sick friends and family members. °¨ Wash your hands often. °¨ Do not eat uncooked or undercooked meats. °¨ Wash all fruits and vegetables. °¨ Do not eat or drink unpasteurized dairy products. °¨ Get regular dental care, and maintain good dental hygiene. °¨ Get a flu shot. Ask your health care provider whether you need any other vaccines. °¨ Wear gloves when gardening. °· Follow up with your health care provider as directed. °SEEK MEDICAL CARE IF: °· You have chills. °· You have a fever. °· You have signs or symptoms of infection. °SEEK IMMEDIATE MEDICAL CARE IF: °· You have trouble breathing. °· You have chest pain. °Document Released: 07/25/2013 Document Reviewed: 07/25/2013 °ExitCare® Patient Information ©2015 [employer]. This information is not intended to replace advice given to you by your health care provider. Make sure you discuss any questions you have with your health care provider. ° °

## 2015-02-14 NOTE — Progress Notes (Signed)
PT Cancellation/Screen Note  Patient Details Name: Manuel [name] MRN: [id number] DOB: [birth year]   Cancelled Treatment:    Reason Eval/Treat Not Completed: PT screened, no needs identified, will sign off. Pt denies need for PT services. Observed pt up and mobilizing in room/hallway without difficulty. Will sign off. Thanks    Weston Anna, MPT Pager: [phone]

## 2015-02-15 ENCOUNTER — Ambulatory Visit: Payer: Medicare PPO | Admitting: Hematology and Oncology

## 2015-02-15 ENCOUNTER — Other Ambulatory Visit: Payer: Medicare PPO

## 2015-02-15 LAB — CULTURE, BLOOD (ROUTINE X 2)
CULTURE: NO GROWTH
Culture: NO GROWTH

## 2015-02-20 ENCOUNTER — Other Ambulatory Visit: Payer: Self-pay | Admitting: *Deleted

## 2015-02-20 DIAGNOSIS — C8310 Mantle cell lymphoma, unspecified site: Secondary | ICD-10-CM

## 2015-02-21 ENCOUNTER — Encounter: Payer: Self-pay | Admitting: Hematology and Oncology

## 2015-02-21 ENCOUNTER — Other Ambulatory Visit (HOSPITAL_BASED_OUTPATIENT_CLINIC_OR_DEPARTMENT_OTHER): Payer: Medicare PPO

## 2015-02-21 ENCOUNTER — Ambulatory Visit (HOSPITAL_BASED_OUTPATIENT_CLINIC_OR_DEPARTMENT_OTHER): Payer: Medicare PPO | Admitting: Hematology and Oncology

## 2015-02-21 ENCOUNTER — Telehealth: Payer: Self-pay | Admitting: Hematology and Oncology

## 2015-02-21 VITALS — BP 112/86 | HR 89 | Temp 97.9°F | Resp 18 | Ht 75.0 in | Wt 206.1 lb

## 2015-02-21 DIAGNOSIS — M1000 Idiopathic gout, unspecified site: Secondary | ICD-10-CM

## 2015-02-21 DIAGNOSIS — C8310 Mantle cell lymphoma, unspecified site: Secondary | ICD-10-CM

## 2015-02-21 DIAGNOSIS — D6481 Anemia due to antineoplastic chemotherapy: Secondary | ICD-10-CM | POA: Diagnosis not present

## 2015-02-21 DIAGNOSIS — D630 Anemia in neoplastic disease: Secondary | ICD-10-CM

## 2015-02-21 LAB — COMPREHENSIVE METABOLIC PANEL (CC13)
ALT: 18 U/L (ref 0–55)
AST: 17 U/L (ref 5–34)
Albumin: 3.3 g/dL — ABNORMAL LOW (ref 3.5–5.0)
Alkaline Phosphatase: 103 U/L (ref 40–150)
Anion Gap: 8 mEq/L (ref 3–11)
BUN: 12.1 mg/dL (ref 7.0–26.0)
CO2: 28 mEq/L (ref 22–29)
Calcium: 9.2 mg/dL (ref 8.4–10.4)
Chloride: 108 mEq/L (ref 98–109)
Creatinine: 1.1 mg/dL (ref 0.7–1.3)
EGFR: 65 mL/min/{1.73_m2} — AB (ref >90)
Glucose: 103 mg/dl (ref 70–140)
POTASSIUM: 4.3 meq/L (ref 3.5–5.1)
SODIUM: 143 meq/L (ref 136–145)
Total Bilirubin: 0.4 mg/dL (ref 0.20–1.20)
Total Protein: 6.1 g/dL — ABNORMAL LOW (ref 6.4–8.3)

## 2015-02-21 LAB — CBC WITH DIFFERENTIAL/PLATELET
BASO%: 0.9 % (ref 0.0–2.0)
Basophils Absolute: 0.1 10e3/uL (ref 0.0–0.1)
EOS%: 1.3 % (ref 0.0–7.0)
Eosinophils Absolute: 0.1 10e3/uL (ref 0.0–0.5)
HCT: 30.1 % — ABNORMAL LOW (ref 38.4–49.9)
HGB: 10.2 g/dL — ABNORMAL LOW (ref 13.0–17.1)
LYMPH%: 8.7 % — ABNORMAL LOW (ref 14.0–49.0)
MCH: 31.2 pg (ref 27.2–33.4)
MCHC: 34 g/dL (ref 32.0–36.0)
MCV: 91.9 fL (ref 79.3–98.0)
MONO#: 0.7 10e3/uL (ref 0.1–0.9)
MONO%: 11.7 % (ref 0.0–14.0)
NEUT#: 4.9 10e3/uL (ref 1.5–6.5)
NEUT%: 77.4 % — ABNORMAL HIGH (ref 39.0–75.0)
Platelets: 218 10e3/uL (ref 140–400)
RBC: 3.27 10e6/uL — ABNORMAL LOW (ref 4.20–5.82)
RDW: 16.3 % — ABNORMAL HIGH (ref 11.0–14.6)
WBC: 6.4 10e3/uL (ref 4.0–10.3)
lymph#: 0.6 10e3/uL — ABNORMAL LOW (ref 0.9–3.3)

## 2015-02-21 LAB — LACTATE DEHYDROGENASE (CC13): LDH: 222 U/L (ref 125–245)

## 2015-02-21 LAB — HOLD TUBE, BLOOD BANK

## 2015-02-21 NOTE — Assessment & Plan Note (Signed)
This is likely due to recent treatment. The patient denies recent history of bleeding such as epistaxis, hematuria or hematochezia. He is asymptomatic from the anemia. I will observe for now.  He does not require transfusion now. I will reduce the dose of chemotherapy as above. After treatment, he will come to my office on a weekly basis for blood draw and transfusion as needed

## 2015-02-21 NOTE — Telephone Encounter (Signed)
per pof to sch pt appt-gave pt copy of avs-sent MW email to sch trmt--pt aware off appts

## 2015-02-21 NOTE — Assessment & Plan Note (Signed)
He tolerated cycle one very poorly. Clinically, he had remarkable response to treatment. After discussion, I plan on admitting him on 02/25/2015 for cycle 2 of treatment with dose adjustment of vincristine, Cytoxan, etoposide and adriamycin by 25%. I plan to repeat PET CT scan next month to assess response to treatment. If he has achieved complete remission, my plan would be to switch him to Rituxan every 60 days along with Revlimid.

## 2015-02-21 NOTE — Progress Notes (Signed)
[facility] OFFICE PROGRESS NOTE  Patient Care Team: Pcp Not In System as PCP - [place], MD as Referring Physician (Hematology and Oncology)  SUMMARY OF ONCOLOGIC HISTORY: Oncology History   Mantle cell lymphoma   Primary site: Lymphoid Neoplasms (Bilateral)   Staging method: AJCC 6th Edition   Clinical: Stage IV signed by Heath Lark, MD on 09/26/2013 10:53 AM   Pathologic: Stage IV signed by Heath Lark, MD on 09/26/2013 10:53 AM   Summary: Stage IV       Mantle cell lymphoma   09/12/2013 Procedure Patient have right axillary lymph node biopsy that confirmed B-cell, non-Hodgkin's lymphoma, suspect mantle cell lymphoma   09/12/2013 Procedure Patient had placement of Infuse-a-Port   09/16/2013 Imaging PET/CT scan showed multifocal hypermetabolic nodal activity involving the neck, chest, abdomen and pelvis as described. In addition, there is moderate splenomegaly with associated hypermetabolic activity consistent with lymphomatous involvement   09/21/2013 - 01/26/2014 Chemotherapy The patient received 6 cycles of bendamustine and rituximab   01/24/2014 Imaging Repeat PET/CT scan showed near complete response to treatment.   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   07/13/2014 Imaging  repeat PET CT scan show complete remission   08/10/2014 Imaging  repeat CT scan of the neck, chest and abdomen show disease relapse   08/16/2014 - 08/22/2014 Hospital Admission He was admitted to the hospital because of respiratory failure, signs of sepsis and severe pancytopenia   08/17/2014 - 01/24/2015 Chemotherapy He was started on Ibrutinib and high dose prednisone, treatment stopped due to recurrent disease   10/24/2014 Imaging Repeat PET scan show marked reduction in splenomegaly and resolving hypermetabolic activity   4/82/7078 Imaging ECHO showed normal EF   01/25/2015 Imaging CT scan of chest, abdomen and pelvis  showed recurrent splenomegaly and lymphadenopathy   01/28/2015 [facility] Admission He was admitted to the hospital for cycle one of [place]   02/10/2015 - 02/14/2015 Hospital Admission He was admitted to the hospital for neutropenic fever and received anti-biotics and blood transfusion    INTERVAL HISTORY: Please see below for problem oriented charting. He returns for further follow-up. He feels better. Denies any chest pain or shortness breath. He complained of fatigue. Denies left upper quadrant pain.  REVIEW OF SYSTEMS:   Constitutional: Denies fevers, chills or abnormal weight loss Eyes: Denies blurriness of vision Ears, nose, mouth, throat, and face: Denies mucositis or sore throat Respiratory: Denies cough, dyspnea or wheezes Cardiovascular: Denies palpitation, chest discomfort or lower extremity swelling Gastrointestinal:  Denies nausea, heartburn or change in bowel habits Skin: Denies abnormal skin rashes Lymphatics: Denies new lymphadenopathy or easy bruising Neurological:Denies numbness, tingling or new weaknesses Behavioral/Psych: Mood is stable, no new changes  All other systems were reviewed with the patient and are negative.  I have reviewed the past medical history, past surgical history, social history and family history with the patient and they are unchanged from previous note.  ALLERGIES:  has No Known Allergies.  MEDICATIONS:  Current Outpatient Prescriptions  Medication Sig Dispense Refill  . acyclovir (ZOVIRAX) 400 MG tablet Take 400 mg by mouth daily.    Marland Kitchen acetaminophen (TYLENOL) 500 MG tablet Take 500 mg by mouth every 6 (six) hours as needed for moderate pain or fever.     . cholecalciferol (VITAMIN D) 1000 UNITS tablet Take 2,000 Units by mouth every morning.    Marland Kitchen levothyroxine (SYNTHROID, LEVOTHROID) 75 MCG tablet TAKE 1 TABLET EVERY  DAY (Patient taking differently: TAKE 75 MCG BY MOUTH DAILY) 90 tablet 6  . loratadine (CLARITIN) 10 MG tablet Take 10 mg  by mouth every morning.    Marland Kitchen LORazepam (ATIVAN) 0.5 MG tablet Take 1 tablet (0.5 mg total) by mouth every 6 (six) hours as needed (Nausea or vomiting). (Patient not taking: Reported on 02/05/2015) 30 tablet 0  . Multiple Vitamin tablet Take 1 tablet by mouth daily.     . ondansetron (ZOFRAN) 8 MG tablet Take 1 tablet (8 mg total) by mouth 2 (two) times daily. Take two times a day starting the day after chemo for 3 days. Then take two times a day as needed for nausea or vomiting. (Patient not taking: Reported on 02/10/2015) 30 tablet 1  . Probiotic Product (PROBIOTIC PO) Take 1 tablet by mouth daily.    . prochlorperazine (COMPAZINE) 10 MG tablet Take 1 tablet (10 mg total) by mouth every 6 (six) hours as needed (Nausea or vomiting). (Patient not taking: Reported on 02/05/2015) 30 tablet 1  . prochlorperazine (COMPAZINE) 25 MG suppository Place 1 suppository (25 mg total) rectally every 12 (twelve) hours as needed for nausea. (Patient not taking: Reported on 02/05/2015) 12 suppository 3   No current facility-administered medications for this visit.    PHYSICAL EXAMINATION: ECOG PERFORMANCE STATUS: 1 - Symptomatic but completely ambulatory  Filed Vitals:   02/21/15 1519  BP: 112/86  Pulse: 89  Temp: 97.9 F (36.6 C)  Resp: 18   Filed Weights   02/21/15 1519  Weight: 206 lb 1.6 oz (93.486 kg)    GENERAL:alert, no distress and comfortable SKIN: skin color, texture, turgor are normal, no rashes or significant lesions EYES: normal, Conjunctiva are pink and non-injected, sclera clear OROPHARYNX:no exudate, no erythema and lips, buccal mucosa, and tongue normal  NECK: supple, thyroid normal size, non-tender, without nodularity LYMPH:  no palpable lymphadenopathy in the cervical, axillary or inguinal LUNGS: clear to auscultation and percussion with normal breathing effort HEART: regular rate & rhythm and no murmurs and no lower extremity edema ABDOMEN:abdomen soft, non-tender and normal bowel  sounds Musculoskeletal:no cyanosis of digits and no clubbing  NEURO: alert & oriented x 3 with fluent speech, no focal motor/sensory deficits  LABORATORY DATA:  I have reviewed the data as listed    Component Value Date/Time   NA 143 02/21/2015 1452   NA 136 02/11/2015 0454   K 4.3 02/21/2015 1452   K 4.3 02/11/2015 0454   CL 106 02/11/2015 0454   CO2 28 02/21/2015 1452   CO2 24 02/11/2015 0454   GLUCOSE 103 02/21/2015 1452   GLUCOSE 108* 02/11/2015 0454   BUN 12.1 02/21/2015 1452   BUN 18 02/11/2015 0454   CREATININE 1.1 02/21/2015 1452   CREATININE 1.18 02/13/2015 0520   CALCIUM 9.2 02/21/2015 1452   CALCIUM 7.7* 02/11/2015 0454   PROT 6.1* 02/21/2015 1452   PROT 5.7* 02/10/2015 1843   ALBUMIN 3.3* 02/21/2015 1452   ALBUMIN 3.0* 02/10/2015 1843   AST 17 02/21/2015 1452   AST 16 02/10/2015 1843   ALT 18 02/21/2015 1452   ALT 43 02/10/2015 1843   ALKPHOS 103 02/21/2015 1452   ALKPHOS 76 02/10/2015 1843   BILITOT 0.40 02/21/2015 1452   BILITOT 0.7 02/10/2015 1843   GFRNONAA 59* 02/13/2015 0520   GFRAA >60 02/13/2015 0520    No results found for: SPEP, UPEP  Lab Results  Component Value Date   WBC 6.4 02/21/2015   NEUTROABS 4.9 02/21/2015  HGB 10.2* 02/21/2015   HCT 30.1* 02/21/2015   MCV 91.9 02/21/2015   PLT 218 02/21/2015      Chemistry      Component Value Date/Time   NA 143 02/21/2015 1452   NA 136 02/11/2015 0454   K 4.3 02/21/2015 1452   K 4.3 02/11/2015 0454   CL 106 02/11/2015 0454   CO2 28 02/21/2015 1452   CO2 24 02/11/2015 0454   BUN 12.1 02/21/2015 1452   BUN 18 02/11/2015 0454   CREATININE 1.1 02/21/2015 1452   CREATININE 1.18 02/13/2015 0520      Component Value Date/Time   CALCIUM 9.2 02/21/2015 1452   CALCIUM 7.7* 02/11/2015 0454   ALKPHOS 103 02/21/2015 1452   ALKPHOS 76 02/10/2015 1843   AST 17 02/21/2015 1452   AST 16 02/10/2015 1843   ALT 18 02/21/2015 1452   ALT 43 02/10/2015 1843   BILITOT 0.40 02/21/2015 1452   BILITOT  0.7 02/10/2015 1843     ASSESSMENT & PLAN:  Mantle cell lymphoma He tolerated cycle one very poorly. Clinically, he had remarkable response to treatment. After discussion, I plan on admitting him on 02/25/2015 for cycle 2 of treatment with dose adjustment of vincristine, Cytoxan, etoposide and adriamycin by 25%. I plan to repeat PET CT scan next month to assess response to treatment. If he has achieved complete remission, my plan would be to switch him to Rituxan every 60 days along with Revlimid.  Anemia due to antineoplastic chemotherapy This is likely due to recent treatment. The patient denies recent history of bleeding such as epistaxis, hematuria or hematochezia. He is asymptomatic from the anemia. I will observe for now.  He does not require transfusion now. I will reduce the dose of chemotherapy as above. After treatment, he will come to my office on a weekly basis for blood draw and transfusion as needed   Orders Placed This Encounter  Procedures  . NM PET Image Restag (PS) Skull Base To Thigh    Standing Status: Future     Number of Occurrences:      Standing Expiration Date: 04/22/2016    Order Specific Question:  Reason for Exam (SYMPTOM  OR DIAGNOSIS REQUIRED)    Answer:  staging lymphoma assess response to Rx    Order Specific Question:  Preferred imaging location?    Answer:  [facility]  . Comprehensive metabolic panel    Standing Status: Standing     Number of Occurrences: 22     Standing Expiration Date: 02/21/2016  . CBC with Differential/Platelet    Standing Status: Standing     Number of Occurrences: 22     Standing Expiration Date: 02/21/2016   All questions were answered. The patient knows to call the clinic with any problems, questions or concerns. No barriers to learning was detected. I spent 40 minutes counseling the patient face to face. The total time spent in the appointment was 55 minutes and more than 50% was on counseling and review of test  results     [facility], [place], MD 02/21/2015 4:35 PM

## 2015-02-22 ENCOUNTER — Telehealth: Payer: Self-pay | Admitting: *Deleted

## 2015-02-22 NOTE — Telephone Encounter (Signed)
Per staff message and POF I have scheduled appts. Advised scheduler of appts. JMW  

## 2015-02-22 NOTE — Telephone Encounter (Signed)
Planned admission inpatient to [facility] on Monday 7/25 thru 7/29 for cycle #2 of R-Epoch.   Have notified Ebony in managed care, Shaun in Patient Placement and [facility] [place] and inpatient pharmacy are also aware of planned admission.

## 2015-02-25 ENCOUNTER — Other Ambulatory Visit: Payer: Self-pay | Admitting: Hematology and Oncology

## 2015-02-25 ENCOUNTER — Encounter (HOSPITAL_COMMUNITY): Payer: Self-pay

## 2015-02-25 ENCOUNTER — Inpatient Hospital Stay (HOSPITAL_COMMUNITY)
Admission: AD | Admit: 2015-02-25 | Discharge: 2015-03-01 | DRG: 847 | Disposition: A | Discharge status: Home / Self Care | Payer: Medicare PPO | Source: Ambulatory Visit | Attending: Hematology and Oncology | Admitting: Hematology and Oncology

## 2015-02-25 ENCOUNTER — Telehealth: Payer: Self-pay | Admitting: Hematology and Oncology

## 2015-02-25 DIAGNOSIS — G622 Polyneuropathy due to other toxic agents: Secondary | ICD-10-CM

## 2015-02-25 DIAGNOSIS — C8310 Mantle cell lymphoma, unspecified site: Secondary | ICD-10-CM | POA: Diagnosis present

## 2015-02-25 DIAGNOSIS — D630 Anemia in neoplastic disease: Secondary | ICD-10-CM | POA: Diagnosis not present

## 2015-02-25 DIAGNOSIS — Z5112 Encounter for antineoplastic immunotherapy: Secondary | ICD-10-CM | POA: Diagnosis not present

## 2015-02-25 DIAGNOSIS — E039 Hypothyroidism, unspecified: Secondary | ICD-10-CM | POA: Diagnosis present

## 2015-02-25 DIAGNOSIS — Z87891 Personal history of nicotine dependence: Secondary | ICD-10-CM

## 2015-02-25 DIAGNOSIS — I10 Essential (primary) hypertension: Secondary | ICD-10-CM | POA: Diagnosis present

## 2015-02-25 DIAGNOSIS — F329 Major depressive disorder, single episode, unspecified: Secondary | ICD-10-CM | POA: Diagnosis present

## 2015-02-25 DIAGNOSIS — G620 Drug-induced polyneuropathy: Secondary | ICD-10-CM | POA: Diagnosis not present

## 2015-02-25 DIAGNOSIS — Z5111 Encounter for antineoplastic chemotherapy: Principal | ICD-10-CM

## 2015-02-25 DIAGNOSIS — K5900 Constipation, unspecified: Secondary | ICD-10-CM | POA: Diagnosis present

## 2015-02-25 DIAGNOSIS — M109 Gout, unspecified: Secondary | ICD-10-CM | POA: Diagnosis present

## 2015-02-25 DIAGNOSIS — R5383 Other fatigue: Secondary | ICD-10-CM | POA: Diagnosis present

## 2015-02-25 DIAGNOSIS — K5909 Other constipation: Secondary | ICD-10-CM

## 2015-02-25 DIAGNOSIS — D6481 Anemia due to antineoplastic chemotherapy: Secondary | ICD-10-CM | POA: Diagnosis present

## 2015-02-25 DIAGNOSIS — E785 Hyperlipidemia, unspecified: Secondary | ICD-10-CM | POA: Diagnosis present

## 2015-02-25 DIAGNOSIS — K769 Liver disease, unspecified: Secondary | ICD-10-CM | POA: Diagnosis present

## 2015-02-25 DIAGNOSIS — L659 Nonscarring hair loss, unspecified: Secondary | ICD-10-CM | POA: Diagnosis present

## 2015-02-25 DIAGNOSIS — T451X5A Adverse effect of antineoplastic and immunosuppressive drugs, initial encounter: Secondary | ICD-10-CM | POA: Diagnosis present

## 2015-02-25 DIAGNOSIS — G629 Polyneuropathy, unspecified: Secondary | ICD-10-CM | POA: Diagnosis present

## 2015-02-25 DIAGNOSIS — R110 Nausea: Secondary | ICD-10-CM | POA: Diagnosis not present

## 2015-02-25 MED ORDER — SODIUM CHLORIDE 0.9 % IV SOLN
8.0000 mg | Freq: Three times a day (TID) | INTRAVENOUS | Status: DC | PRN
  Administered 2015-02-28: 8 mg via INTRAVENOUS
  Filled 2015-02-25 (×3): qty 4

## 2015-02-25 MED ORDER — HEPARIN SOD (PORK) LOCK FLUSH 100 UNIT/ML IV SOLN: 500.0000 [IU] | Freq: Once | INTRAVENOUS | Status: AC | PRN

## 2015-02-25 MED ORDER — HEPARIN SOD (PORK) LOCK FLUSH 100 UNIT/ML IV SOLN: 250.0000 [IU] | Freq: Once | INTRAVENOUS | Status: AC | PRN

## 2015-02-25 MED ORDER — SENNOSIDES-DOCUSATE SODIUM 8.6-50 MG PO TABS
1.0000 | ORAL_TABLET | Freq: Two times a day (BID) | ORAL | Status: DC
  Administered 2015-02-25 – 2015-03-01 (×8): 1 via ORAL
  Filled 2015-02-25 (×11): qty 1

## 2015-02-25 MED ORDER — COLD PACK MISC ONCOLOGY
1.0000 | Freq: Once | Status: AC | PRN
  Filled 2015-02-25: qty 1

## 2015-02-25 MED ORDER — ALTEPLASE 2 MG IJ SOLR
2.0000 mg | Freq: Once | INTRAMUSCULAR | Status: AC | PRN
  Filled 2015-02-25: qty 2

## 2015-02-25 MED ORDER — ENOXAPARIN SODIUM 40 MG/0.4ML ~~LOC~~ SOLN
40.0000 mg | SUBCUTANEOUS | Status: DC
  Administered 2015-02-25 – 2015-02-28 (×4): 40 mg via SUBCUTANEOUS
  Filled 2015-02-25 (×5): qty 0.4

## 2015-02-25 MED ORDER — SODIUM CHLORIDE 0.9 % IV SOLN
8.0000 mg | Freq: Three times a day (TID) | INTRAVENOUS | Status: DC | PRN
  Filled 2015-02-25: qty 4

## 2015-02-25 MED ORDER — ACETAMINOPHEN 325 MG PO TABS: 650.0000 mg | ORAL_TABLET | ORAL | Status: DC | PRN

## 2015-02-25 MED ORDER — SODIUM CHLORIDE 0.9 % IV SOLN
INTRAVENOUS | Status: DC
  Administered 2015-02-25 – 2015-02-28 (×2): via INTRAVENOUS

## 2015-02-25 MED ORDER — ONDANSETRON 4 MG PO TBDP: 8.0000 mg | ORAL_TABLET | Freq: Three times a day (TID) | ORAL | Status: DC | PRN

## 2015-02-25 MED ORDER — ACYCLOVIR 400 MG PO TABS
400.0000 mg | ORAL_TABLET | Freq: Every day | ORAL | Status: DC
  Administered 2015-02-25 – 2015-03-01 (×5): 400 mg via ORAL
  Filled 2015-02-25 (×5): qty 1

## 2015-02-25 MED ORDER — SODIUM CHLORIDE 0.9 % IJ SOLN: 3.0000 mL | INTRAMUSCULAR | Status: DC | PRN

## 2015-02-25 MED ORDER — HOT PACK MISC ONCOLOGY
1.0000 | Freq: Once | Status: AC | PRN
  Filled 2015-02-25: qty 1

## 2015-02-25 MED ORDER — OXYCODONE HCL 5 MG PO TABS: 5.0000 mg | ORAL_TABLET | ORAL | Status: DC | PRN

## 2015-02-25 MED ORDER — VINCRISTINE SULFATE CHEMO INJECTION 1 MG/ML
Freq: Once | INTRAVENOUS | Status: AC
  Administered 2015-02-25: 12:00:00 via INTRAVENOUS
  Filled 2015-02-25: qty 8

## 2015-02-25 MED ORDER — SODIUM CHLORIDE 0.9 % IJ SOLN: 10.0000 mL | INTRAMUSCULAR | Status: DC | PRN

## 2015-02-25 MED ORDER — ONDANSETRON HCL 40 MG/20ML IJ SOLN
Freq: Once | INTRAMUSCULAR | Status: AC
  Administered 2015-02-25: 8 mg via INTRAVENOUS
  Filled 2015-02-25: qty 4

## 2015-02-25 MED ORDER — LEVOTHYROXINE SODIUM 75 MCG PO TABS
75.0000 ug | ORAL_TABLET | Freq: Every day | ORAL | Status: DC
  Administered 2015-02-26 – 2015-03-01 (×4): 75 ug via ORAL
  Filled 2015-02-25 (×5): qty 1

## 2015-02-25 MED ORDER — LIDOCAINE-PRILOCAINE 2.5-2.5 % EX CREA: TOPICAL_CREAM | Freq: Once | CUTANEOUS | Status: DC

## 2015-02-25 MED ORDER — SODIUM CHLORIDE 0.9 % IV SOLN: INTRAVENOUS | Status: DC

## 2015-02-25 MED ORDER — ONDANSETRON HCL 8 MG PO TABS: 8.0000 mg | ORAL_TABLET | Freq: Three times a day (TID) | ORAL | Status: DC | PRN

## 2015-02-25 MED ORDER — ZOLPIDEM TARTRATE 5 MG PO TABS: 5.0000 mg | ORAL_TABLET | Freq: Every evening | ORAL | Status: DC | PRN

## 2015-02-25 NOTE — H&P (Signed)
[place]  [phone]    ADMISSION NOTE  Admitting MD: Heath Lark, MD  Attending MD: Heath Lark, MD I have seen the patient, examined him and edited the notes as follows   HPI: Manuel [name] is an 74 y.o. male with a history of mantle Cell Lymphoma, admitted on 7/25 for cycle 2 chemotherapy with R-EPOCH, with dose reduce Adriamycin. Denies fevers, chills, night sweats, vision changes, or mucositis. Denies any respiratory complaints. Denies any chest pain or palpitations. Denies lower extremity swelling. Denies nausea, heartburn or change in bowel habits. Denies abdominal pain. Last bowel movement on 7/24. Appetite is normal. Denies any dysuria. Denies abnormal skin rashes. He reports mild neuropathy in the tips of fingers and toes, but he is able to ambulate without difficulties. He also reports alopecia after first cycle. Denies any bleeding issues such as epistaxis, hematemesis, hematuria or hematochezia. He reports some fatigue.  Oncology History   Mantle cell lymphoma   Primary site: Lymphoid Neoplasms (Bilateral)   Staging method: AJCC 6th Edition   Clinical: Stage IV signed by Heath Lark, MD on 09/26/2013 10:53 AM   Pathologic: Stage IV signed by Heath Lark, MD on 09/26/2013 10:53 AM   Summary: Stage IV       Mantle cell lymphoma   09/12/2013 Procedure Patient have right axillary lymph node biopsy that confirmed B-cell, non-Hodgkin's lymphoma, suspect mantle cell lymphoma   09/12/2013 Procedure Patient had placement of Infuse-a-Port   09/16/2013 Imaging PET/CT scan showed multifocal hypermetabolic nodal activity involving the neck, chest, abdomen and pelvis as described. In addition, there is moderate splenomegaly with associated hypermetabolic activity consistent with lymphomatous involvement   09/21/2013 - 01/26/2014 Chemotherapy The patient received 6 cycles of bendamustine and rituximab   01/24/2014 Imaging Repeat PET/CT scan showed near complete  response to treatment.   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   07/13/2014 Imaging  repeat PET CT scan show complete remission   08/10/2014 Imaging  repeat CT scan of the neck, chest and abdomen show disease relapse   08/16/2014 - 08/22/2014 Hospital Admission He was admitted to the hospital because of respiratory failure, signs of sepsis and severe pancytopenia   08/17/2014 - 01/24/2015 Chemotherapy He was started on Ibrutinib and high dose prednisone, treatment stopped due to recurrent disease   10/24/2014 Imaging Repeat PET scan show marked reduction in splenomegaly and resolving hypermetabolic activity   9/62/9528 Imaging ECHO showed normal EF   01/25/2015 Imaging CT scan of chest, abdomen and pelvis showed recurrent splenomegaly and lymphadenopathy   01/28/2015 [facility] Admission He was admitted to the hospital for cycle one of [place]   02/10/2015 - 02/14/2015 Hospital Admission He was admitted to the hospital for neutropenic fever and received anti-biotics and blood transfusion   02/25/15 Chemotherapy/Hospital Admission He is admitted to the hospital for cycle 2 chemotherapy with R-EPOCH     PMH:  Past Medical History  Diagnosis Date  . Thyroid disease   . Hyperlipemia   . Liver disease   . Malnutrition 08/30/2013  . Hypothyroidism   . Depression     Spouse passed 5 months ago  . Gout 09/28/2013  . Fatigue 08/31/2014  . Essential hypertension 10/25/2014  . 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    Surgeries:  Past Surgical History  Procedure Laterality Date  . Back surgery      1985 and 1986  . Axillary lymph node biopsy  Right 09/12/2013    Procedure: RIGHT AXILLARY LYMPH NODE BIOPSY;  Surgeon: Marcello Moores A. Cornett, MD;  Location: [place];  Service: General;  Laterality: Right;  . Portacath placement Right 09/12/2013    Procedure: INSERTION PORT-A-CATH;  Surgeon: Joyice Faster. Cornett, MD;  Location: [place];  Service: General;  Laterality: Right;    Allergies: No Known Allergies  Medications:   Prior to Admission:  Prescriptions prior to admission  Medication Sig Dispense Refill Last Dose  . acetaminophen (TYLENOL) 500 MG tablet Take 500 mg by mouth every 6 (six) hours as needed for moderate pain or fever.    02/10/2015 at Unknown time  . acyclovir (ZOVIRAX) 400 MG tablet Take 400 mg by mouth daily.     . cholecalciferol (VITAMIN D) 1000 UNITS tablet Take 2,000 Units by mouth every morning.   02/10/2015 at Unknown time  . levothyroxine (SYNTHROID, LEVOTHROID) 75 MCG tablet TAKE 1 TABLET EVERY DAY (Patient taking differently: TAKE 75 MCG BY MOUTH DAILY) 90 tablet 6 Past Week at Unknown time  . loratadine (CLARITIN) 10 MG tablet Take 10 mg by mouth every morning.   02/10/2015 at Unknown time  . LORazepam (ATIVAN) 0.5 MG tablet Take 1 tablet (0.5 mg total) by mouth every 6 (six) hours as needed (Nausea or vomiting). (Patient not taking: Reported on 02/05/2015) 30 tablet 0 Not Taking at Unknown time  . Multiple Vitamin tablet Take 1 tablet by mouth daily.    02/10/2015 at Unknown time  . ondansetron (ZOFRAN) 8 MG tablet Take 1 tablet (8 mg total) by mouth 2 (two) times daily. Take two times a day starting the day after chemo for 3 days. Then take two times a day as needed for nausea or vomiting. (Patient not taking: Reported on 02/10/2015) 30 tablet 1 Not Taking at Unknown time  . Probiotic Product (PROBIOTIC PO) Take 1 tablet by mouth daily.   02/09/2015 at Unknown time  . prochlorperazine (COMPAZINE) 10 MG tablet Take 1 tablet (10 mg total) by mouth every 6 (six) hours as needed (Nausea or vomiting). (Patient not taking: Reported on 02/05/2015) 30 tablet 1 Not Taking at Unknown time  . prochlorperazine (COMPAZINE) 25 MG suppository Place 1 suppository (25 mg total) rectally every 12 (twelve) hours as needed for nausea. (Patient not taking: Reported on  02/05/2015) 12 suppository 3 Not Taking at Unknown time    Scheduled Meds: . enoxaparin (LOVENOX) injection  40 mg Subcutaneous Q24H  . lidocaine-prilocaine   Topical Once  . senna-docusate  1 tablet Oral BID   Continuous Infusions: . sodium chloride     PRN Meds:.acetaminophen, ondansetron **OR** ondansetron **OR** ondansetron (ZOFRAN) IV **OR** ondansetron (ZOFRAN) IV, oxyCODONE  Review of Systems:  Constitutional: Denies fevers, chills or abnormal night sweats. He reports some fatigue. Eyes: Denies blurriness of vision, double vision or watery eyes Ears, nose, mouth, throat, and face: Denies mucositis or sore throat Respiratory: Denies cough, dyspnea or wheezes Cardiovascular: Denies palpitation, chest discomfort or lower extremity swelling Gastrointestinal:  Denies nausea, heartburn or change in bowel habits. Denies abdominal pain Skin: Denies abnormal skin rashes Lymphatics: Denies new lymphadenopathy or easy bruising Neurological:Denies numbness, tingling or new weaknesses Behavioral/Psych: Mood is stable, no new changes  All other systems were reviewed with the patient and are negative  Family History:  Family History  Problem Relation Age of Onset  . Cancer Mother     breast ca  . Cancer Sister     NHL  .  Cancer Brother     ?colon can  . Cancer Brother     lung ca    Social History:  reports that he has quit smoking. His smoking use included Cigarettes. He has a 13 pack-year smoking history. He has never used smokeless tobacco. He reports that he does not drink alcohol or use illicit drugs.  Physical Exam:   Filed Vitals:   02/25/15 1321  BP: 124/54  Pulse: 84  Temp: 97.7 F (36.5 C)  Resp: 20    GENERAL:alert, no distress and comfortable SKIN: skin color, texture, turgor are normal, no rashes or significant lesions EYES: normal, conjunctiva are pink and non-injected, sclera clear OROPHARYNX:no exudate, no erythema and lips, buccal mucosa, and tongue  normal  NECK: supple, thyroid normal size, non-tender, without nodularity LYMPH:  no palpable lymphadenopathy in the cervical, axillary or inguinal LUNGS: clear to auscultation and percussion with normal breathing effort HEART: regular rate & rhythm and no murmurs and no lower extremity edema. Right port normal ABDOMEN: soft, non-tender and normal bowel sounds.  Musculoskeletal:no cyanosis of digits and no clubbing  PSYCH: alert & oriented x 3 with fluent speech NEURO: no focal motor/sensory deficits    LABS: CBC   Recent Labs Lab 02/21/15 1452  WBC 6.4  HGB 10.2*  HCT 30.1*  PLT 218  MCV 91.9  MCH 31.2  MCHC 34.0  RDW 16.3*  LYMPHSABS 0.6*  MONOABS 0.7  EOSABS 0.1  BASOSABS 0.1      CMP    Recent Labs Lab 02/21/15 1452  NA 143  K 4.3  CO2 28  GLUCOSE 103  BUN 12.1  CREATININE 1.1  CALCIUM 9.2  AST 17  ALT 18  ALKPHOS 103  BILITOT 0.40        Component Value Date/Time   BILITOT 0.40 02/21/2015 1452   BILITOT 0.7 02/10/2015 1843      Imaging Studies: Dg Chest 2 View  02/10/2015   CLINICAL DATA:  74 year old male with lymphoma and fever.  EXAM: CHEST  2 VIEW  COMPARISON:  CT dated 01/25/2015 and chest radiograph dated 08/19/2014  FINDINGS: Right pectoral Port-A-Cath with tip over central SVC. Two views of the chest demonstrate mild emphysematous changes of the lungs. No focal consolidation, pleural effusion, or pneumothorax. The cardiomediastinal silhouette is within normal limits. The osteopenia degenerative changes of the spine.  IMPRESSION: No focal consolidation.   Electronically Signed   By: Anner Crete M.D.   On: 02/10/2015 19:37     Assessment and Plan: 74 y.o. male with  Mantle cell lymphoma He tolerated cycle one very poorly. Clinically, he had remarkable response to treatment. After discussion, I plan on admitting him on 02/25/2015 for cycle 2 of treatment with dose adjustment of vincristine, Cytoxan, etoposide and adriamycin by  25%. Plan to repeat PET CT scan next month to assess response to treatment. If he has achieved complete remission, the plan would be to switch him to Rituxan every 60 days along with Revlimid.  Anemia due to antineoplastic chemotherapy This is likely due to recent treatment.  The patient denies recent history of bleeding such as epistaxis, hematuria or hematochezia. He is asymptomatic from the anemia.  Will observe. He does not require transfusion now. Will reduce the dose of chemotherapy as above. After treatment, he will return to the [facility] on a weekly basis for blood draw and transfusion as needed  Tumor Lysis Prophylaxis Patient is at risk of tumor lysis for which she is to continue  to be on allopurinol Last LDH on 7/21 was 222  with Uric Acid on 7/1at 5.7  Recheck tomorrow Potassium and Calcium are unremarkable. Will monitor.  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 Plan to reduce the dose of vincristine with this cycle  DVT prophylaxis On Lovenox  Full Code  Discharge planning DC on 7/29 once treatment is completed  [facility] E 02/25/2015 9:53 AM  Manuel Klasen, MD 02/25/2015

## 2015-02-25 NOTE — Progress Notes (Signed)
BSA, Dosages and dilutions verified with 2nd RN Reyne Dumas for Adria/Vepesid/Oncovin.

## 2015-02-26 DIAGNOSIS — D6481 Anemia due to antineoplastic chemotherapy: Secondary | ICD-10-CM

## 2015-02-26 DIAGNOSIS — G629 Polyneuropathy, unspecified: Secondary | ICD-10-CM

## 2015-02-26 LAB — BASIC METABOLIC PANEL
ANION GAP: 9 (ref 5–15)
BUN: 19 mg/dL (ref 6–20)
CALCIUM: 9 mg/dL (ref 8.9–10.3)
CHLORIDE: 111 mmol/L (ref 101–111)
CO2: 23 mmol/L (ref 22–32)
Creatinine, Ser: 1.15 mg/dL (ref 0.61–1.24)
GFR calc Af Amer: >60 mL/min (ref >60)
GFR calc non Af Amer: >60 mL/min (ref >60)
GLUCOSE: 147 mg/dL — AB (ref 65–99)
POTASSIUM: 4.3 mmol/L (ref 3.5–5.1)
SODIUM: 143 mmol/L (ref 135–145)

## 2015-02-26 LAB — URIC ACID: Uric Acid, Serum: 5.8 mg/dL (ref 4.4–7.6)

## 2015-02-26 MED ORDER — SODIUM CHLORIDE 0.9 % IV SOLN
Freq: Once | INTRAVENOUS | Status: AC
  Administered 2015-02-26: 10:00:00 via INTRAVENOUS

## 2015-02-26 MED ORDER — VINCRISTINE SULFATE CHEMO INJECTION 1 MG/ML
Freq: Once | INTRAVENOUS | Status: AC
  Administered 2015-02-26: 13:00:00 via INTRAVENOUS
  Filled 2015-02-26: qty 8

## 2015-02-26 MED ORDER — SODIUM CHLORIDE 0.9 % IV SOLN
375.0000 mg/m2 | Freq: Once | INTRAVENOUS | Status: AC
  Administered 2015-02-26: 800 mg via INTRAVENOUS
  Filled 2015-02-26: qty 80

## 2015-02-26 MED ORDER — ALTEPLASE 2 MG IJ SOLR: 2.0000 mg | Freq: Once | INTRAMUSCULAR | Status: AC | PRN

## 2015-02-26 MED ORDER — HEPARIN SOD (PORK) LOCK FLUSH 100 UNIT/ML IV SOLN: 500.0000 [IU] | Freq: Once | INTRAVENOUS | Status: AC | PRN

## 2015-02-26 MED ORDER — EPINEPHRINE HCL 0.1 MG/ML IJ SOSY: 0.2500 mg | PREFILLED_SYRINGE | Freq: Once | INTRAMUSCULAR | Status: AC | PRN

## 2015-02-26 MED ORDER — SODIUM CHLORIDE 0.9 % IV SOLN
Freq: Once | INTRAVENOUS | Status: AC
  Administered 2015-02-26: 8 mg via INTRAVENOUS
  Filled 2015-02-26: qty 4

## 2015-02-26 MED ORDER — SODIUM CHLORIDE 0.9 % IV SOLN: Freq: Once | INTRAVENOUS | Status: AC | PRN

## 2015-02-26 MED ORDER — DIPHENHYDRAMINE HCL 50 MG/ML IJ SOLN: 50.0000 mg | Freq: Once | INTRAMUSCULAR | Status: AC | PRN

## 2015-02-26 MED ORDER — ACETAMINOPHEN 325 MG PO TABS
650.0000 mg | ORAL_TABLET | Freq: Once | ORAL | Status: AC
  Administered 2015-02-26: 650 mg via ORAL
  Filled 2015-02-26: qty 2

## 2015-02-26 MED ORDER — FAMOTIDINE IN NACL 20-0.9 MG/50ML-% IV SOLN: 20.0000 mg | Freq: Once | INTRAVENOUS | Status: AC | PRN

## 2015-02-26 MED ORDER — ALBUTEROL SULFATE (2.5 MG/3ML) 0.083% IN NEBU: 2.5000 mg | INHALATION_SOLUTION | Freq: Once | RESPIRATORY_TRACT | Status: AC | PRN

## 2015-02-26 MED ORDER — SODIUM CHLORIDE 0.9 % IJ SOLN: 3.0000 mL | INTRAMUSCULAR | Status: DC | PRN

## 2015-02-26 MED ORDER — HEPARIN SOD (PORK) LOCK FLUSH 100 UNIT/ML IV SOLN: 250.0000 [IU] | Freq: Once | INTRAVENOUS | Status: AC | PRN

## 2015-02-26 MED ORDER — POLYETHYLENE GLYCOL 3350 17 G PO PACK
17.0000 g | PACK | Freq: Every day | ORAL | Status: DC
  Administered 2015-02-26 – 2015-02-28 (×3): 17 g via ORAL
  Filled 2015-02-26 (×4): qty 1

## 2015-02-26 MED ORDER — DIPHENHYDRAMINE HCL 50 MG/ML IJ SOLN: 25.0000 mg | Freq: Once | INTRAMUSCULAR | Status: AC | PRN

## 2015-02-26 MED ORDER — EPINEPHRINE HCL 1 MG/ML IJ SOLN: 0.5000 mg | Freq: Once | INTRAMUSCULAR | Status: AC | PRN

## 2015-02-26 MED ORDER — DIPHENHYDRAMINE HCL 50 MG PO CAPS
50.0000 mg | ORAL_CAPSULE | Freq: Once | ORAL | Status: AC
  Administered 2015-02-26: 50 mg via ORAL
  Filled 2015-02-26: qty 1

## 2015-02-26 MED ORDER — METHYLPREDNISOLONE SODIUM SUCC 125 MG IJ SOLR: 125.0000 mg | Freq: Once | INTRAMUSCULAR | Status: AC | PRN

## 2015-02-26 MED ORDER — SODIUM CHLORIDE 0.9 % IJ SOLN
10.0000 mL | INTRAMUSCULAR | Status: DC | PRN
Start: 2015-02-26 — End: 2015-03-01

## 2015-02-26 NOTE — Progress Notes (Signed)
Patient's BSA, dosage and dilution for Rituxan verified with 2nd RN Reyne Dumas.

## 2015-02-26 NOTE — Progress Notes (Signed)
Patient receiving rituxan infusion, so far tolerating well, no complaints, vital signs stable.

## 2015-02-26 NOTE — Progress Notes (Signed)
Doxorubicin,etoposide, oncovin, dosages and calculations verified with 2nd RN , Jonelle Sidle.

## 2015-02-26 NOTE — Progress Notes (Signed)
Manuel [name]   DOB:[birth year]   OA#:[id number]   SWF#:[id number]  Patient Care Team: Pcp Not In System as PCP - [place], MD as Referring Physician (Hematology and Oncology)  I have seen the patient, examined him and edited the notes as follows  Subjective: Patient seen and examined. Tolerated day 1 chemotherapy without any side effects. Denies fevers, chills, night sweats, vision changes, or mucositis. Denies any respiratory complaints. Denies any chest pain or palpitations. Denies lower extremity swelling. Denies nausea, heartburn or change in bowel habits. Last bowel movement on 7/24. Appetite is normal. Denies any dysuria. 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 Denies any bleeding issues such as epistaxis, hematemesis, hematuria or hematochezia. Ambulating without difficulty.   Scheduled Meds: . acyclovir  400 mg Oral Daily  . DOXOrubicin/vinCRIStine/etoposide CHEMO IV infusion for Inpatient CI   Intravenous Once  . enoxaparin (LOVENOX) injection  40 mg Subcutaneous Q24H  . levothyroxine  75 mcg Oral QAC breakfast  . lidocaine-prilocaine   Topical Once  . senna-docusate  1 tablet Oral BID   Continuous Infusions: . sodium chloride 50 mL/hr at 02/25/15 1015  . sodium chloride     PRN Meds:.acetaminophen, ondansetron **OR** ondansetron **OR** ondansetron (ZOFRAN) IV **OR** ondansetron (ZOFRAN) IV, oxyCODONE, sodium chloride, sodium chloride, zolpidem  Objective:  Filed Vitals:   02/26/15 0548  BP: 120/58  Pulse: 80  Temp: 97.6 F (36.4 C)  Resp: 18      Intake/Output Summary (Last 24 hours) at 02/26/15 0656 Last data filed at 02/26/15 0548  Gross per 24 hour  Intake    960 ml  Output      0 ml  Net    960 ml    ECOG PERFORMANCE STATUS: 1  GENERAL:alert, no distress and comfortable SKIN: skin color, texture, turgor are normal, no rashes or significant lesions EYES: normal, conjunctiva are  pink and non-injected, sclera clear OROPHARYNX:no exudate, no erythema and lips, buccal mucosa, and tongue normal  NECK: supple, thyroid normal size, non-tender, without nodularity LYMPH:  no palpable lymphadenopathy in the cervical, axillary or inguinal LUNGS: clear to auscultation and percussion with normal breathing effort HEART: regular rate & rhythm and no murmurs and no lower extremity edema. Right port normal. ABDOMEN: soft, non-tender and normal bowel sounds Musculoskeletal:no cyanosis of digits and no clubbing  PSYCH: alert & oriented x 3 with fluent speech NEURO: no focal motor/sensory deficits    Labs:   Recent Labs Lab 02/21/15 1452  WBC 6.4  HGB 10.2*  HCT 30.1*  PLT 218  MCV 91.9  MCH 31.2  MCHC 34.0  RDW 16.3*  LYMPHSABS 0.6*  MONOABS 0.7  EOSABS 0.1  BASOSABS 0.1     Chemistries:    Recent Labs Lab 02/21/15 1452 02/26/15 0445  NA 143 143  K 4.3 4.3  CL  --  111  CO2 28 23  GLUCOSE 103 147*  BUN 12.1 19  CREATININE 1.1 1.15  CALCIUM 9.2 9.0  AST 17  --   ALT 18  --   ALKPHOS 103  --   BILITOT 0.40  --     GFR Estimated Creatinine Clearance: 68.4 mL/min (by C-G formula based on Cr of 1.15).  Liver Function Tests:  Recent Labs Lab 02/21/15 1452  AST 17  ALT 18  ALKPHOS 103  BILITOT 0.40  PROT 6.1*  ALBUMIN 3.3*   Assessment/Plan: 74 y.o.  Mantle cell lymphoma He tolerated  cycle one very poorly. Clinically, he had remarkable response to treatment. He was admitted on 02/25/2015 for cycle 2 of treatment with dose adjustment of vincristine, Cytoxan, etoposide and adriamycin by 25%. Plan to repeat PET CT scan next month to assess response to treatment. If he has achieved complete remission, the plan would be to switch him to Rituxan every 60 days along with Revlimid.  Anemia due to antineoplastic chemotherapy This is likely due to recent treatment.  The patient denies recent history of bleeding such as epistaxis, hematuria or  hematochezia. He is asymptomatic from the anemia.  Will observe. He does not require transfusion now. Dose of chemotherapy has been reduced as above.  After treatment, he will return to the [facility] on a weekly basis for blood draw and transfusion as needed  Tumor Lysis Prophylaxis Patient is at risk of tumor lysis for which she is to continue to be on allopurinol Last LDH on 7/21 was 222, new values pending; Uric Acid on 7/26 is 5.8 Potassium and Calcium are unremarkable. Will monitor.  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 Dose of vincristine has been reduced with this cycle  DVT prophylaxis On Lovenox  Chronic constipation, resolved I will continue regular Laxatives as the patient had to strain recently for severe constipation.  Full Code  Discharge planning DC on 7/29 once treatment is completed   Rondel Jumbo, PA-C 02/26/2015  6:56 AM Emmalia Heyboer, MD 02/26/2015

## 2015-02-27 DIAGNOSIS — G620 Drug-induced polyneuropathy: Secondary | ICD-10-CM

## 2015-02-27 MED ORDER — SODIUM CHLORIDE 0.9 % IV SOLN
Freq: Once | INTRAVENOUS | Status: AC
  Administered 2015-02-27: 8 mg via INTRAVENOUS
  Filled 2015-02-27: qty 4

## 2015-02-27 MED ORDER — VINCRISTINE SULFATE CHEMO INJECTION 1 MG/ML
Freq: Once | INTRAVENOUS | Status: AC
  Administered 2015-02-27: 13:00:00 via INTRAVENOUS
  Filled 2015-02-27: qty 8

## 2015-02-27 NOTE — Care Management Important Message (Signed)
Important Message  Patient Details  Name: Manuel [name] MRN: [id number] Date of Birth: 1940/09/24   Medicare Important Message Given:  Yes-second notification given    Camillo Flaming 02/27/2015, 12:34 [place] Message  Patient Details  Name: Manuel [name] MRN: [id number] Date of Birth: 02-11-1941   Medicare Important Message Given:  Yes-second notification given    Camillo Flaming 02/27/2015, 12:33 PM

## 2015-02-27 NOTE — Progress Notes (Signed)
Manuel [name]   DOB:11-Oct-1940   ZD#:[id number]   IRJ#:[id number]  Patient Care Team: Pcp Not In System as PCP - [place], MD as Referring Physician (Hematology and Oncology)  I have seen the patient, examined him and edited the notes as follows  Subjective: Patient seen and examined. Tolerated day 2 chemotherapy without any side effects. He did not sleep well due to the chemo machine's noise. Denies fevers, chills, night sweats, vision changes, or mucositis. Denies any respiratory complaints. Denies any chest pain or palpitations. Denies lower extremity swelling. Denies nausea, heartburn or change in bowel habits. Last bowel movement on 7/27. Appetite is normal. Denies any dysuria. 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 Denies any bleeding issues such as epistaxis, hematemesis, hematuria or hematochezia. Ambulating without difficulty.   Scheduled Meds: . acyclovir  400 mg Oral Daily  . DOXOrubicin/vinCRIStine/etoposide CHEMO IV infusion for Inpatient CI   Intravenous Once  . enoxaparin (LOVENOX) injection  40 mg Subcutaneous Q24H  . levothyroxine  75 mcg Oral QAC breakfast  . lidocaine-prilocaine   Topical Once  . polyethylene glycol  17 g Oral Daily  . senna-docusate  1 tablet Oral BID   Continuous Infusions: . sodium chloride 50 mL/hr at 02/25/15 1015  . sodium chloride     PRN Meds:.acetaminophen, ondansetron **OR** ondansetron **OR** ondansetron (ZOFRAN) IV **OR** ondansetron (ZOFRAN) IV, oxyCODONE, sodium chloride, sodium chloride, sodium chloride, sodium chloride, zolpidem  Objective:  Filed Vitals:   02/27/15 0504  BP: 123/58  Pulse: 92  Temp: 97.5 F (36.4 C)  Resp: 20      Intake/Output Summary (Last 24 hours) at 02/27/15 3016 Last data filed at 02/27/15 0504  Gross per 24 hour  Intake    600 ml  Output      0 ml  Net    600 ml    ECOG PERFORMANCE STATUS: 1  GENERAL:alert, no distress  and comfortable SKIN: skin color, texture, turgor are normal, no rashes or significant lesions EYES: normal, conjunctiva are pink and non-injected, sclera clear OROPHARYNX:no exudate, no erythema and lips, buccal mucosa, and tongue normal  NECK: supple, thyroid normal size, non-tender, without nodularity LYMPH:  no palpable lymphadenopathy in the cervical, axillary or inguinal LUNGS: clear to auscultation and percussion with normal breathing effort HEART: regular rate & rhythm and no murmurs and no lower extremity edema. Right port normal. ABDOMEN: soft, non-tender and normal bowel sounds Musculoskeletal:no cyanosis of digits and no clubbing  PSYCH: alert & oriented x 3 with fluent speech NEURO: no focal motor/sensory deficits    Labs:   Recent Labs Lab 02/21/15 1452  WBC 6.4  HGB 10.2*  HCT 30.1*  PLT 218  MCV 91.9  MCH 31.2  MCHC 34.0  RDW 16.3*  LYMPHSABS 0.6*  MONOABS 0.7  EOSABS 0.1  BASOSABS 0.1     Chemistries:    Recent Labs Lab 02/21/15 1452 02/26/15 0445  NA 143 143  K 4.3 4.3  CL  --  111  CO2 28 23  GLUCOSE 103 147*  BUN 12.1 19  CREATININE 1.1 1.15  CALCIUM 9.2 9.0  AST 17  --   ALT 18  --   ALKPHOS 103  --   BILITOT 0.40  --     GFR Estimated Creatinine Clearance: 68.4 mL/min (by C-G formula based on Cr of 1.15).  Liver Function Tests:  Recent Labs Lab 02/21/15 1452  AST 17  ALT  18  ALKPHOS 103  BILITOT 0.40  PROT 6.1*  ALBUMIN 3.3*   Assessment/Plan: 74 y.o.  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 Clinically, he had remarkable response to treatment. He was admitted on 02/25/2015 for cycle 2 of treatment with dose adjustment of vincristine, Cytoxan, etoposide and adriamycin by 25%. Plan to repeat PET CT scan next month to assess response to treatment. If he has achieved complete remission, the plan would be to switch him to Rituxan every 60 days along with Revlimid.  Anemia due to antineoplastic  chemotherapy This is likely due to recent treatment.  The patient denies recent history of bleeding such as epistaxis, hematuria or hematochezia. He is asymptomatic from the anemia.  Will observe. He does not require transfusion now. Dose of chemotherapy has been reduced as above.  After treatment, he will return to the [facility] on a weekly basis for blood draw and transfusion as needed  Tumor Lysis Prophylaxis Patient is at risk of tumor lysis for which she is to continue to be on allopurinol Last LDH on 7/21 was 222, no new LDH was drawn on admission. Uric Acid on 7/26 is 5.8 Potassium and Calcium are unremarkable. Will monitor.  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 Dose of vincristine has been reduced with this cycle  DVT prophylaxis On Lovenox  Chronic constipation, resolved Will continue regular Laxatives as the patient had to strain recently for severe constipation.  Full Code  Discharge planning DC on 7/29 once treatment is completed  Rondel Jumbo, PA-C 02/27/2015  7:05 AM Maejor Erven, MD 02/27/2015

## 2015-02-28 ENCOUNTER — Telehealth: Payer: Self-pay

## 2015-02-28 DIAGNOSIS — R110 Nausea: Secondary | ICD-10-CM

## 2015-02-28 MED ORDER — VINCRISTINE SULFATE CHEMO INJECTION 1 MG/ML
Freq: Once | INTRAVENOUS | Status: AC
  Administered 2015-02-28: 13:00:00 via INTRAVENOUS
  Filled 2015-02-28: qty 8

## 2015-02-28 MED ORDER — SODIUM CHLORIDE 0.9 % IV SOLN
Freq: Once | INTRAVENOUS | Status: AC
  Administered 2015-02-28: 8 mg via INTRAVENOUS
  Filled 2015-02-28 (×2): qty 4

## 2015-02-28 NOTE — Progress Notes (Signed)
Manuel [name]   DOB:1941/05/21   TK#:[id number]   DJM#:[id number]  Patient Care Team: Pcp Not In System as PCP - [place], MD as Referring Physician (Hematology and Oncology)  I have seen the patient, examined him and edited the notes as follows  Subjective: Patient seen and examined. Tolerated day 3 chemotherapy without any side effects apart from nausea today. He has slept well. Denies fevers, chills, night sweats, vision changes, or mucositis. Denies any respiratory complaints. Denies any chest pain or palpitations. Denies lower extremity swelling. Denies heartburn or change in bowel habits. Last bowel movement on 7/28. Appetite is normal. Denies any dysuria. 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 Denies any bleeding issues such as epistaxis, hematemesis, hematuria or hematochezia. Ambulating without difficulty.   Scheduled Meds: . acyclovir  400 mg Oral Daily  . DOXOrubicin/vinCRIStine/etoposide CHEMO IV infusion for Inpatient CI   Intravenous Once  . enoxaparin (LOVENOX) injection  40 mg Subcutaneous Q24H  . levothyroxine  75 mcg Oral QAC breakfast  . lidocaine-prilocaine   Topical Once  . polyethylene glycol  17 g Oral Daily  . senna-docusate  1 tablet Oral BID   Continuous Infusions: . sodium chloride 50 mL/hr at 02/25/15 1015  . sodium chloride     PRN Meds:.acetaminophen, ondansetron **OR** ondansetron **OR** ondansetron (ZOFRAN) IV **OR** ondansetron (ZOFRAN) IV, oxyCODONE, sodium chloride, sodium chloride, sodium chloride, sodium chloride, zolpidem  Objective:  Filed Vitals:   02/28/15 0510  BP: 160/77  Pulse: 59  Temp: 97.9 F (36.6 C)  Resp: 18      Intake/Output Summary (Last 24 hours) at 02/28/15 2229 Last data filed at 02/28/15 0511  Gross per 24 hour  Intake   1385 ml  Output   1150 ml  Net    235 ml    ECOG PERFORMANCE STATUS: 1  GENERAL:alert, no distress and  comfortable SKIN: skin color, texture, turgor are normal, no rashes or significant lesions EYES: normal, conjunctiva are pink and non-injected, sclera clear OROPHARYNX:no exudate, no erythema and lips, buccal mucosa, and tongue normal  NECK: supple, thyroid normal size, non-tender, without nodularity LYMPH:  no palpable lymphadenopathy in the cervical, axillary or inguinal LUNGS: clear to auscultation and percussion with normal breathing effort HEART: regular rate & rhythm and no murmurs and no lower extremity edema. Right port normal. ABDOMEN: soft, non-tender and normal bowel sounds Musculoskeletal:no cyanosis of digits and no clubbing  PSYCH: alert & oriented x 3 with fluent speech NEURO: no focal motor/sensory deficits    Labs:   Recent Labs Lab 02/21/15 1452  WBC 6.4  HGB 10.2*  HCT 30.1*  PLT 218  MCV 91.9  MCH 31.2  MCHC 34.0  RDW 16.3*  LYMPHSABS 0.6*  MONOABS 0.7  EOSABS 0.1  BASOSABS 0.1     Chemistries:    Recent Labs Lab 02/21/15 1452 02/26/15 0445  NA 143 143  K 4.3 4.3  CL  --  111  CO2 28 23  GLUCOSE 103 147*  BUN 12.1 19  CREATININE 1.1 1.15  CALCIUM 9.2 9.0  AST 17  --   ALT 18  --   ALKPHOS 103  --   BILITOT 0.40  --     GFR Estimated Creatinine Clearance: 68.4 mL/min (by C-G formula based on Cr of 1.15).  Liver Function Tests:  Recent Labs Lab 02/21/15 1452  AST 17  ALT 18  ALKPHOS 103  BILITOT 0.40  PROT 6.1*  ALBUMIN 3.3*   Assessment/Plan: 74 y.o.  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 Clinically, he had remarkable response to treatment. He was admitted on 02/25/2015 for cycle 2 of treatment with dose adjustment of vincristine, Cytoxan, etoposide and adriamycin by 25%. Plan to repeat PET CT scan next month to assess response to treatment. If he has achieved complete remission, the plan would be to switch him to Rituxan every 60 days along with Revlimid.  Anemia due to antineoplastic  chemotherapy This is likely due to recent treatment.  The patient denies recent history of bleeding such as epistaxis, hematuria or hematochezia. He is asymptomatic from the anemia.  Will observe. He does not require transfusion now. Dose of chemotherapy has been reduced as above.  After treatment, he will return to the [facility] on a weekly basis for blood draw and transfusion as needed  Tumor Lysis Prophylaxis Patient is at risk of tumor lysis for which she is to continue to be on allopurinol Last LDH on 7/21 was 222, no new LDH was drawn on admission. Uric Acid on 7/26 is 5.8 Potassium and Calcium are unremarkable. Will monitor.  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 Dose of vincristine has been reduced with this cycle  Nausea Antiemetics prn  DVT prophylaxis On Lovenox  Chronic constipation, resolved Will continue regular Laxatives as the patient had to strain recently for severe constipation.  Full Code  Discharge planning DC on 7/29 once treatment is completed  Rondel Jumbo, PA-C 02/28/2015  6:49 AM Leilanny Fluitt, MD 02/28/2015

## 2015-02-28 NOTE — Progress Notes (Signed)
Dose 3 of chemo checked with second RN, Aldean Baker. Pre-med given, good blood return from [place], chemo infusion at 22 mL/Hr

## 2015-02-28 NOTE — Telephone Encounter (Signed)
Manuel [name] missed a call from Dr Alvy Bimler. She will have her cell phone with her for the rest of the day and will try to answer it if Dr Alvy Bimler will call her again. Her cell [phone]

## 2015-03-01 LAB — CBC WITH DIFFERENTIAL/PLATELET
Basophils Absolute: 0 10*3/uL (ref 0.0–0.1)
Basophils Relative: 0 % (ref 0–1)
Eosinophils Absolute: 0 10*3/uL (ref 0.0–0.7)
Eosinophils Relative: 0 % (ref 0–5)
HEMATOCRIT: 26.2 % — AB (ref 39.0–52.0)
HEMOGLOBIN: 9.2 g/dL — AB (ref 13.0–17.0)
Lymphocytes Relative: 11 % — ABNORMAL LOW (ref 12–46)
Lymphs Abs: 0.5 10*3/uL — ABNORMAL LOW (ref 0.7–4.0)
MCH: 31.8 pg (ref 26.0–34.0)
MCHC: 35.1 g/dL (ref 30.0–36.0)
MCV: 90.7 fL (ref 78.0–100.0)
Monocytes Absolute: 0.4 10*3/uL (ref 0.1–1.0)
Monocytes Relative: 10 % (ref 3–12)
Neutro Abs: 3.2 10*3/uL (ref 1.7–7.7)
Neutrophils Relative %: 79 % — ABNORMAL HIGH (ref 43–77)
Platelets: 173 10*3/uL (ref 150–400)
RBC: 2.89 MIL/uL — ABNORMAL LOW (ref 4.22–5.81)
RDW: 16.4 % — AB (ref 11.5–15.5)
WBC: 4.1 10*3/uL (ref 4.0–10.5)

## 2015-03-01 LAB — COMPREHENSIVE METABOLIC PANEL
ALBUMIN: 3.3 g/dL — AB (ref 3.5–5.0)
ALT: 14 U/L — ABNORMAL LOW (ref 17–63)
ANION GAP: 6 (ref 5–15)
AST: 14 U/L — ABNORMAL LOW (ref 15–41)
Alkaline Phosphatase: 69 U/L (ref 38–126)
BUN: 18 mg/dL (ref 6–20)
CALCIUM: 8.6 mg/dL — AB (ref 8.9–10.3)
CO2: 24 mmol/L (ref 22–32)
Chloride: 111 mmol/L (ref 101–111)
Creatinine, Ser: 0.99 mg/dL (ref 0.61–1.24)
GFR calc Af Amer: >60 mL/min (ref >60)
Glucose, Bld: 112 mg/dL — ABNORMAL HIGH (ref 65–99)
Potassium: 3.7 mmol/L (ref 3.5–5.1)
Sodium: 141 mmol/L (ref 135–145)
TOTAL PROTEIN: 5.5 g/dL — AB (ref 6.5–8.1)
Total Bilirubin: 0.7 mg/dL (ref 0.3–1.2)

## 2015-03-01 MED ORDER — PALONOSETRON HCL INJECTION 0.25 MG/5ML
0.2500 mg | Freq: Once | INTRAVENOUS | Status: AC
  Administered 2015-03-01: 0.25 mg via INTRAVENOUS
  Filled 2015-03-01: qty 5

## 2015-03-01 MED ORDER — SODIUM CHLORIDE 0.9 % IV SOLN
10.0000 mg | Freq: Once | INTRAVENOUS | Status: AC
Start: 2015-03-01 — End: 2015-03-01
  Administered 2015-03-01: 10 mg via INTRAVENOUS
  Filled 2015-03-01: qty 1

## 2015-03-01 MED ORDER — HEPARIN SOD (PORK) LOCK FLUSH 100 UNIT/ML IV SOLN
500.0000 [IU] | INTRAVENOUS | Status: DC | PRN
  Filled 2015-03-01: qty 5

## 2015-03-01 MED ORDER — SODIUM CHLORIDE 0.9 % IV SOLN
562.5000 mg/m2 | Freq: Once | INTRAVENOUS | Status: AC
  Administered 2015-03-01: 1240 mg via INTRAVENOUS
  Filled 2015-03-01: qty 62

## 2015-03-01 NOTE — Discharge Summary (Signed)
Patient ID: Manuel [name] MRN: [id number] DOB/AGE: [birth year] 74 y.o.  Admit date: 02/25/2015 Discharge date: 03/01/2015  Patient Care Team: Pcp Not In System as PCP - [place], MD as Referring Physician (Hematology and Oncology) I have seen the patient, examined him and edited the notes as follows  Brief History of Present Illness: For complete details please refer to admission H and P, but in brief, Manuel [name] is an 74 y.o. male with a history of mantle Cell Lymphoma, admitted on 7/25 for cycle 2 chemotherapy with R-EPOCH, with dose reduced Adriamycin.  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 Clinically, he had remarkable response to treatment. He was admitted on 02/25/2015 for cycle 2 of treatment with dose adjustment of vincristine, Cytoxan, etoposide and adriamycin by 25%. He tolerated chemo well without significant side effects. Plan to repeat PET CT scan next month to assess response to treatment. If he has achieved complete remission, the plan would be to switch him to Rituxan every 60 days along with Revlimid.  Anemia due to antineoplastic chemotherapy This is likely due to recent treatment.  The patient denies recent history of bleeding such as epistaxis, hematuria or hematochezia. He is asymptomatic from the anemia.  Will observe. He does not require transfusion now. Dose of chemotherapy has been reduced as above.  After treatment, he will return to the [facility] on a weekly basis for blood draw and transfusion as needed  Tumor Lysis Prophylaxis Patient is at risk of tumor lysis for which she is to continue to be on allopurinol Last LDH on 7/21 was 222, no new LDH was drawn on admission. Uric Acid on 7/26 is 5.8 Potassium and Calcium are unremarkable. Will monitor.  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 Dose of vincristine  has been reduced with this cycle  Nausea, controlled This was controlled with antiemetics prn  DVT prophylaxis He was placed on Lovenox during his hospitalization  Chronic constipation, resolved He was paced on regular Laxatives as the patient had to strain recently for severe constipation. He had one episode of loose stools on 7/28 without recurrence.  Laxatives were placed on hold prior to discharge, can resume as outpatient  Full Code  Discharge planning DC on 7/29 once treatment is completed  Procedures: S/P Chemotherapy with R-EPOCH , with dose reduced Adriamycin D1C2 on 7/25-7/29  Consultations: None  Past Medical History  Diagnosis Date  . Thyroid disease   . Hyperlipemia   . Liver disease   . Malnutrition 08/30/2013  . Hypothyroidism   . Depression     Spouse passed 5 months ago  . Gout 09/28/2013  . Fatigue 08/31/2014  . Essential hypertension 10/25/2014  . 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     acyclovir 400 MG tablet  Commonly known as:  ZOVIRAX  Take 400 mg by mouth daily.     cholecalciferol 1000 UNITS tablet  Commonly known as:  VITAMIN D  Take 2,000 Units by mouth every morning.     levothyroxine 75 MCG tablet  Commonly known as:  SYNTHROID, LEVOTHROID  TAKE 1 TABLET EVERY  DAY     loratadine 10 MG tablet  Commonly known as:  CLARITIN  Take 10 mg by mouth every morning.     LORazepam 0.5 MG tablet  Commonly known as:  ATIVAN  Take 1 tablet (0.5 mg total) by mouth every 6 (six) hours as needed (Nausea or vomiting).     ondansetron 8 MG tablet  Commonly known as:  ZOFRAN  Take 1 tablet (8 mg total) by mouth 2 (two) times daily. Take two times a day starting the day after chemo for 3 days. Then  take two times a day as needed for nausea or vomiting.     prochlorperazine 10 MG tablet  Commonly known as:  COMPAZINE  Take 1 tablet (10 mg total) by mouth every 6 (six) hours as needed (Nausea or vomiting).     prochlorperazine 25 MG suppository  Commonly known as:  COMPAZINE  Place 1 suppository (25 mg total) rectally every 12 (twelve) hours as needed for nausea.      ASK your doctor about these medications        Multiple Vitamin tablet  Take 1 tablet by mouth daily.        Discharge Condition: Stable  Diet recommendation:  Regular.  Disposition and Follow-up:    ECOG PERFORMANCE STATUS: 1  Physical Exam at Discharge:  Subjective:  Tolerated chemotherapy without any side effects apart from nausea on 7/28, now controlled with antiemetics. He did not sleep well due to the machine noise and some fatigue.. Denies fevers, chills, night sweats, vision changes, or mucositis. Denies any respiratory complaints. Denies any chest pain or palpitations. Denies lower extremity swelling. Denies heartburn. He had one episode of loose stools last evening without recurrence. Last bowel movement on 7/29, more formed. Appetite is decreased due to taste changes due to chemo. Denies any dysuria. 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 Denies any bleeding issues such as epistaxis, hematemesis, hematuria or hematochezia. Ambulating without difficulty.  Objective:  BP 184/72 mmHg  Pulse 52  Temp(Src) 98.1 F (36.7 C) (Oral)  Resp 16  Ht 6\' 3"  (1.905 m)  Wt 206 lb (93.441 kg)  BMI 25.75 kg/m2  SpO2 100%  GENERAL:alert, no distress and comfortable, conversant SKIN: skin color, texture, turgor are normal, no rashes or significant lesions EYES: normal, conjunctiva are pink and non-injected, sclera clear OROPHARYNX:no exudate, no erythema and lips, buccal mucosa, and tongue normal  NECK: supple, thyroid normal size, non-tender,  without nodularity LYMPH: no palpable lymphadenopathy in the cervical, axillary or inguinal LUNGS: clear to auscultation and percussion with normal breathing effort HEART: regular rate & rhythm and no murmurs and no lower extremity edema. Right port normal. ABDOMEN: soft, non-tender and normal bowel sounds Musculoskeletal:no cyanosis of digits and no clubbing  PSYCH: alert & oriented x 3 with fluent speech NEURO: no focal motor/sensory deficits   Significant Diagnostic Studies:  Dg Chest 2 View  02/10/2015   CLINICAL DATA:  74 year old male with lymphoma and fever.  EXAM: CHEST  2 VIEW  COMPARISON:  CT dated 01/25/2015 and chest radiograph dated 08/19/2014  FINDINGS: Right pectoral Port-A-Cath with tip over central SVC. Two views of the chest demonstrate mild emphysematous changes of the lungs. No focal consolidation, pleural effusion, or pneumothorax. The cardiomediastinal silhouette is within normal limits. The osteopenia degenerative changes of the spine.  IMPRESSION: No focal consolidation.   Electronically Signed   By: Laren Everts.D.  On: 02/10/2015 19:37    Discharge Laboratory Values:  CBC  Recent Labs Lab 03/01/15 0405  WBC 4.1  HGB 9.2*  HCT 26.2*  PLT 173  MCV 90.7  MCH 31.8  MCHC 35.1  RDW 16.4*  LYMPHSABS 0.5*  MONOABS 0.4  EOSABS 0.0  BASOSABS 0.0    Chemistries   Recent Labs Lab 02/26/15 0445 03/01/15 0405  NA 143 141  K 4.3 3.7  CL 111 111  CO2 23 24  GLUCOSE 147* 112*  BUN 19 18  CREATININE 1.15 0.99  CALCIUM 9.0 8.6*    Signed: Sharene Butters, PA-C 03/01/2015, 11:05 AM  Shemaiah Round, MD 03/01/2015

## 2015-03-01 NOTE — Progress Notes (Signed)
Cycle # 2 R-CHOP, day #3. Chemo verified and checked with second RN, doxorubicin, etoposide and vincristine. Good blood return from [place]. Patient alert and ready to proceed.

## 2015-03-01 NOTE — Progress Notes (Signed)
Cyclophosphamide administered on final day of R-CHOP. Medication dosage checked against treatment plan and verified with second RN.

## 2015-03-04 ENCOUNTER — Telehealth: Payer: Self-pay | Admitting: Hematology and Oncology

## 2015-03-04 ENCOUNTER — Ambulatory Visit (HOSPITAL_BASED_OUTPATIENT_CLINIC_OR_DEPARTMENT_OTHER): Payer: Medicare PPO | Admitting: Hematology and Oncology

## 2015-03-04 ENCOUNTER — Encounter: Payer: Self-pay | Admitting: Hematology and Oncology

## 2015-03-04 ENCOUNTER — Other Ambulatory Visit (HOSPITAL_BASED_OUTPATIENT_CLINIC_OR_DEPARTMENT_OTHER): Payer: Medicare PPO

## 2015-03-04 ENCOUNTER — Ambulatory Visit (HOSPITAL_BASED_OUTPATIENT_CLINIC_OR_DEPARTMENT_OTHER): Payer: Medicare PPO

## 2015-03-04 VITALS — BP 134/55 | HR 90 | Temp 97.7°F | Resp 20 | Ht 75.0 in | Wt 205.5 lb

## 2015-03-04 DIAGNOSIS — D696 Thrombocytopenia, unspecified: Secondary | ICD-10-CM

## 2015-03-04 DIAGNOSIS — D6481 Anemia due to antineoplastic chemotherapy: Secondary | ICD-10-CM

## 2015-03-04 DIAGNOSIS — Z5189 Encounter for other specified aftercare: Secondary | ICD-10-CM | POA: Diagnosis not present

## 2015-03-04 DIAGNOSIS — C8310 Mantle cell lymphoma, unspecified site: Secondary | ICD-10-CM

## 2015-03-04 DIAGNOSIS — M1000 Idiopathic gout, unspecified site: Secondary | ICD-10-CM

## 2015-03-04 DIAGNOSIS — M25512 Pain in left shoulder: Secondary | ICD-10-CM | POA: Diagnosis not present

## 2015-03-04 DIAGNOSIS — D6959 Other secondary thrombocytopenia: Secondary | ICD-10-CM | POA: Diagnosis not present

## 2015-03-04 DIAGNOSIS — D630 Anemia in neoplastic disease: Secondary | ICD-10-CM

## 2015-03-04 DIAGNOSIS — T451X5A Adverse effect of antineoplastic and immunosuppressive drugs, initial encounter: Secondary | ICD-10-CM

## 2015-03-04 LAB — CBC WITH DIFFERENTIAL/PLATELET
BASO%: 0.5 % (ref 0.0–2.0)
Basophils Absolute: 0 10*3/uL (ref 0.0–0.1)
EOS%: 6.8 % (ref 0.0–7.0)
Eosinophils Absolute: 0.4 10*3/uL (ref 0.0–0.5)
HEMATOCRIT: 29.9 % — AB (ref 38.4–49.9)
HEMOGLOBIN: 10.4 g/dL — AB (ref 13.0–17.1)
LYMPH%: 6 % — ABNORMAL LOW (ref 14.0–49.0)
MCH: 31 pg (ref 27.2–33.4)
MCHC: 34.7 g/dL (ref 32.0–36.0)
MCV: 89.5 fL (ref 79.3–98.0)
MONO#: 0 10*3/uL — ABNORMAL LOW (ref 0.1–0.9)
MONO%: 0.9 % (ref 0.0–14.0)
NEUT#: 4.4 10*3/uL (ref 1.5–6.5)
NEUT%: 85.8 % — AB (ref 39.0–75.0)
Platelets: 135 10*3/uL — ABNORMAL LOW (ref 140–400)
RBC: 3.34 10*6/uL — AB (ref 4.20–5.82)
RDW: 17.1 % — ABNORMAL HIGH (ref 11.0–14.6)
WBC: 5.2 10*3/uL (ref 4.0–10.3)
lymph#: 0.3 10*3/uL — ABNORMAL LOW (ref 0.9–3.3)

## 2015-03-04 LAB — COMPREHENSIVE METABOLIC PANEL (CC13)
ALT: 11 U/L (ref 0–55)
AST: 9 U/L (ref 5–34)
Albumin: 3.2 g/dL — ABNORMAL LOW (ref 3.5–5.0)
Alkaline Phosphatase: 72 U/L (ref 40–150)
Anion Gap: 8 mEq/L (ref 3–11)
BILIRUBIN TOTAL: 0.45 mg/dL (ref 0.20–1.20)
BUN: 18.1 mg/dL (ref 7.0–26.0)
CHLORIDE: 109 meq/L (ref 98–109)
CO2: 24 meq/L (ref 22–29)
CREATININE: 1.1 mg/dL (ref 0.7–1.3)
Calcium: 8.5 mg/dL (ref 8.4–10.4)
EGFR: 68 mL/min/{1.73_m2} — ABNORMAL LOW (ref >90)
Glucose: 134 mg/dl (ref 70–140)
Potassium: 3.4 mEq/L — ABNORMAL LOW (ref 3.5–5.1)
Sodium: 142 mEq/L (ref 136–145)
Total Protein: 5.4 g/dL — ABNORMAL LOW (ref 6.4–8.3)

## 2015-03-04 LAB — HOLD TUBE, BLOOD BANK

## 2015-03-04 LAB — LACTATE DEHYDROGENASE (CC13): LDH: 151 U/L (ref 125–245)

## 2015-03-04 MED ORDER — OXYCODONE HCL 5 MG PO TABS: 5.0000 mg | ORAL_TABLET | ORAL | Status: DC | PRN

## 2015-03-04 MED ORDER — TBO-FILGRASTIM 300 MCG/0.5ML ~~LOC~~ SOSY
300.0000 ug | PREFILLED_SYRINGE | Freq: Every day | SUBCUTANEOUS | Status: DC
  Administered 2015-03-04: 300 ug via SUBCUTANEOUS
  Filled 2015-03-04: qty 0.5

## 2015-03-04 MED ORDER — LENALIDOMIDE 25 MG PO CAPS: 25.0000 mg | ORAL_CAPSULE | Freq: Every day | ORAL | Status: DC

## 2015-03-04 NOTE — Assessment & Plan Note (Signed)
This is likely due to recent treatment. The patient denies recent history of bleeding such as epistaxis, hematuria or hematochezia. He is asymptomatic from the anemia. I will observe for now.    

## 2015-03-04 NOTE — Progress Notes (Signed)
[place] [facility] OFFICE PROGRESS NOTE  Patient Care Team: Pcp Not In System as PCP - [place], MD as Referring Physician (Hematology and Oncology)  SUMMARY OF ONCOLOGIC HISTORY: Oncology History   Mantle cell lymphoma   Primary site: Lymphoid Neoplasms (Bilateral)   Staging method: AJCC 6th Edition   Clinical: Stage IV signed by Heath Lark, MD on 09/26/2013 10:53 AM   Pathologic: Stage IV signed by Heath Lark, MD on 09/26/2013 10:53 AM   Summary: Stage IV       Mantle cell lymphoma   09/12/2013 Procedure Patient have right axillary lymph node biopsy that confirmed B-cell, non-Hodgkin's lymphoma, suspect mantle cell lymphoma   09/12/2013 Procedure Patient had placement of Infuse-a-Port   09/16/2013 Imaging PET/CT scan showed multifocal hypermetabolic nodal activity involving the neck, chest, abdomen and pelvis as described. In addition, there is moderate splenomegaly with associated hypermetabolic activity consistent with lymphomatous involvement   09/21/2013 - 01/26/2014 Chemotherapy The patient received 6 cycles of bendamustine and rituximab   01/24/2014 Imaging Repeat PET/CT scan showed near complete response to treatment.   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   07/13/2014 Imaging  repeat PET CT scan show complete remission   08/10/2014 Imaging  repeat CT scan of the neck, chest and abdomen show disease relapse   08/16/2014 - 08/22/2014 Hospital Admission He was admitted to the hospital because of respiratory failure, signs of sepsis and severe pancytopenia   08/17/2014 - 01/24/2015 Chemotherapy He was started on Ibrutinib and high dose prednisone, treatment stopped due to recurrent disease   10/24/2014 Imaging Repeat PET scan show marked reduction in splenomegaly and resolving hypermetabolic activity   03/31/5620 Imaging ECHO showed normal EF   01/25/2015 Imaging CT scan of chest, abdomen and pelvis  showed recurrent splenomegaly and lymphadenopathy   01/28/2015 - 02/01/2015 Hospital Admission He was admitted to the hospital for cycle one of [place]   02/10/2015 - 02/14/2015 Hospital Admission He was admitted to the hospital for neutropenic fever and received anti-biotics and blood transfusion   02/25/2015 - 03/01/2015 Hospital Admission He was admitted to the hospital to receive cycle 2 R-EPOCH    INTERVAL HISTORY: Please see below for problem oriented charting. He returns for further follow-up. Over the weekend, he had very mild nausea and no vomiting. He complained of fatigue and has been sleeping a lot. He complained of intermittent left shoulder pain but it is improving. Denies any change in bowel habits.   REVIEW OF SYSTEMS:   Constitutional: Denies fevers, chills or abnormal weight loss Eyes: Denies blurriness of vision Ears, nose, mouth, throat, and face: Denies mucositis or sore throat Respiratory: Denies cough, dyspnea or wheezes Cardiovascular: Denies palpitation, chest discomfort or lower extremity swelling Skin: Denies abnormal skin rashes Lymphatics: Denies new lymphadenopathy or easy bruising Neurological:Denies numbness, tingling or new weaknesses Behavioral/Psych: Mood is stable, no new changes  All other systems were reviewed with the patient and are negative.  I have reviewed the past medical history, past surgical history, social history and family history with the patient and they are unchanged from previous note.  ALLERGIES:  has No Known Allergies.  MEDICATIONS:  Current Outpatient Prescriptions  Medication Sig Dispense Refill  . acetaminophen (TYLENOL) 500 MG tablet Take 500 mg by mouth every 6 (six) hours as needed for moderate pain or fever.     Marland Kitchen acyclovir (ZOVIRAX) 400 MG tablet Take 400 mg by mouth daily.    Marland Kitchen  cholecalciferol (VITAMIN D) 1000 UNITS tablet Take 2,000 Units by mouth every morning.    Marland Kitchen levothyroxine (SYNTHROID, LEVOTHROID) 75 MCG tablet  TAKE 1 TABLET EVERY DAY (Patient taking differently: TAKE 75 MCG BY MOUTH DAILY) 90 tablet 6  . loratadine (CLARITIN) 10 MG tablet Take 10 mg by mouth every morning.    Marland Kitchen LORazepam (ATIVAN) 0.5 MG tablet Take 1 tablet (0.5 mg total) by mouth every 6 (six) hours as needed (Nausea or vomiting). 30 tablet 0  . Multiple Vitamin tablet Take 1 tablet by mouth daily.     . ondansetron (ZOFRAN) 8 MG tablet Take 1 tablet (8 mg total) by mouth 2 (two) times daily. Take two times a day starting the day after chemo for 3 days. Then take two times a day as needed for nausea or vomiting. 30 tablet 1  . prochlorperazine (COMPAZINE) 10 MG tablet Take 1 tablet (10 mg total) by mouth every 6 (six) hours as needed (Nausea or vomiting). 30 tablet 1  . prochlorperazine (COMPAZINE) 25 MG suppository Place 1 suppository (25 mg total) rectally every 12 (twelve) hours as needed for nausea. 12 suppository 3  . lenalidomide (REVLIMID) 25 MG capsule Take 1 capsule (25 mg total) by mouth daily. 21 capsule 0  . oxyCODONE (ROXICODONE) 5 MG immediate release tablet Take 1 tablet (5 mg total) by mouth every 4 (four) hours as needed for severe pain. 30 tablet 0   No current facility-administered medications for this visit.   Facility-Administered Medications Ordered in Other Visits  Medication Dose Route Frequency Provider Last Rate Last Dose  . Tbo-Filgrastim (GRANIX) injection 300 mcg  300 mcg Subcutaneous q1800 Heath Lark, MD   300 mcg at 03/04/15 0915    PHYSICAL EXAMINATION: ECOG PERFORMANCE STATUS: 2 - Symptomatic, <50% confined to bed  Filed Vitals:   03/04/15 0844  BP: 134/55  Pulse: 90  Temp: 97.7 F (36.5 C)  Resp: 20   Filed Weights   03/04/15 0844  Weight: 205 lb 8 oz (93.214 kg)    GENERAL:alert, no distress and comfortable SKIN: skin color, texture, turgor are normal, no rashes or significant lesions EYES: normal, Conjunctiva are pink and non-injected, sclera clear OROPHARYNX:no exudate, no erythema  and lips, buccal mucosa, and tongue normal  NECK: supple, thyroid normal size, non-tender, without nodularity LYMPH:  no palpable lymphadenopathy in the cervical, axillary or inguinal LUNGS: clear to auscultation and percussion with normal breathing effort HEART: regular rate & rhythm and no murmurs and no lower extremity edema ABDOMEN:abdomen soft, non-tender and normal bowel sounds. Palpable splenomegaly  Musculoskeletal:no cyanosis of digits and no clubbing  NEURO: alert & oriented x 3 with fluent speech, no focal motor/sensory deficits  LABORATORY DATA:  I have reviewed the data as listed    Component Value Date/Time   NA 142 03/04/2015 0825   NA 141 03/01/2015 0405   K 3.4* 03/04/2015 0825   K 3.7 03/01/2015 0405   CL 111 03/01/2015 0405   CO2 24 03/04/2015 0825   CO2 24 03/01/2015 0405   GLUCOSE 134 03/04/2015 0825   GLUCOSE 112* 03/01/2015 0405   BUN 18.1 03/04/2015 0825   BUN 18 03/01/2015 0405   CREATININE 1.1 03/04/2015 0825   CREATININE 0.99 03/01/2015 0405   CALCIUM 8.5 03/04/2015 0825   CALCIUM 8.6* 03/01/2015 0405   PROT 5.4* 03/04/2015 0825   PROT 5.5* 03/01/2015 0405   ALBUMIN 3.2* 03/04/2015 0825   ALBUMIN 3.3* 03/01/2015 0405   AST 9 03/04/2015 0825  AST 14* 03/01/2015 0405   ALT 11 03/04/2015 0825   ALT 14* 03/01/2015 0405   ALKPHOS 72 03/04/2015 0825   ALKPHOS 69 03/01/2015 0405   BILITOT 0.45 03/04/2015 0825   BILITOT 0.7 03/01/2015 0405   GFRNONAA >60 03/01/2015 0405   GFRAA >60 03/01/2015 0405    No results found for: SPEP, UPEP  Lab Results  Component Value Date   WBC 5.2 03/04/2015   NEUTROABS 4.4 03/04/2015   HGB 10.4* 03/04/2015   HCT 29.9* 03/04/2015   MCV 89.5 03/04/2015   PLT 135* 03/04/2015      Chemistry      Component Value Date/Time   NA 142 03/04/2015 0825   NA 141 03/01/2015 0405   K 3.4* 03/04/2015 0825   K 3.7 03/01/2015 0405   CL 111 03/01/2015 0405   CO2 24 03/04/2015 0825   CO2 24 03/01/2015 0405   BUN 18.1  03/04/2015 0825   BUN 18 03/01/2015 0405   CREATININE 1.1 03/04/2015 0825   CREATININE 0.99 03/01/2015 0405      Component Value Date/Time   CALCIUM 8.5 03/04/2015 0825   CALCIUM 8.6* 03/01/2015 0405   ALKPHOS 72 03/04/2015 0825   ALKPHOS 69 03/01/2015 0405   AST 9 03/04/2015 0825   AST 14* 03/01/2015 0405   ALT 11 03/04/2015 0825   ALT 14* 03/01/2015 0405   BILITOT 0.45 03/04/2015 0825   BILITOT 0.7 03/01/2015 0405       ASSESSMENT & PLAN:  Mantle cell lymphoma He tolerated last cycle treatment well upon from minimum side effects. I will proceed with G-CSF support this week to prevent risk of admission to the hospital with infection. He will be seen on a weekly basis for supportive care. He is scheduled to have PET CT scan at the end of the month. If PET scan showed near remission status, I will switch his treatment to Revlimid along with rituximab  Anemia due to antineoplastic chemotherapy This is likely due to recent treatment. The patient denies recent history of bleeding such as epistaxis, hematuria or hematochezia. He is asymptomatic from the anemia. I will observe for now.  Left shoulder pain Cause is unknown. I gave him a prescription of oxycodone to take as needed.  Thrombocytopenia This is mild, related to recent treatment. He is not symptomatic. Recommend observation only.   No orders of the defined types were placed in this encounter.   All questions were answered. The patient knows to call the clinic with any problems, questions or concerns. No barriers to learning was detected. I spent 25 minutes counseling the patient face to face. The total time spent in the appointment was 30 minutes and more than 50% was on counseling and review of test results     [facility], [place], MD 03/04/2015 9:24 AM

## 2015-03-04 NOTE — Assessment & Plan Note (Signed)
This is mild, related to recent treatment. He is not symptomatic. Recommend observation only.

## 2015-03-04 NOTE — Assessment & Plan Note (Signed)
Cause is unknown. I gave him a prescription of oxycodone to take as needed.

## 2015-03-04 NOTE — Telephone Encounter (Signed)
Pt confirmed flush/inj per 08/01 POF, gave pt avs and calendar... Manuel [name]

## 2015-03-04 NOTE — Assessment & Plan Note (Signed)
He tolerated last cycle treatment well upon from minimum side effects. I will proceed with G-CSF support this week to prevent risk of admission to the hospital with infection. He will be seen on a weekly basis for supportive care. He is scheduled to have PET CT scan at the end of the month. If PET scan showed near remission status, I will switch his treatment to Revlimid along with rituximab

## 2015-03-05 ENCOUNTER — Other Ambulatory Visit: Payer: Self-pay | Admitting: *Deleted

## 2015-03-05 ENCOUNTER — Ambulatory Visit (HOSPITAL_BASED_OUTPATIENT_CLINIC_OR_DEPARTMENT_OTHER): Payer: Medicare PPO

## 2015-03-05 VITALS — BP 123/56 | HR 86 | Temp 98.2°F

## 2015-03-05 DIAGNOSIS — C8310 Mantle cell lymphoma, unspecified site: Secondary | ICD-10-CM | POA: Diagnosis not present

## 2015-03-05 DIAGNOSIS — Z5189 Encounter for other specified aftercare: Secondary | ICD-10-CM | POA: Diagnosis not present

## 2015-03-05 MED ORDER — TBO-FILGRASTIM 300 MCG/0.5ML ~~LOC~~ SOSY
300.0000 ug | PREFILLED_SYRINGE | Freq: Every day | SUBCUTANEOUS | Status: DC
  Administered 2015-03-05: 300 ug via SUBCUTANEOUS
  Filled 2015-03-05: qty 0.5

## 2015-03-06 ENCOUNTER — Other Ambulatory Visit: Payer: Self-pay | Admitting: *Deleted

## 2015-03-06 ENCOUNTER — Ambulatory Visit (HOSPITAL_BASED_OUTPATIENT_CLINIC_OR_DEPARTMENT_OTHER): Payer: Medicare PPO

## 2015-03-06 VITALS — BP 132/58 | HR 79 | Temp 98.4°F

## 2015-03-06 DIAGNOSIS — C8310 Mantle cell lymphoma, unspecified site: Secondary | ICD-10-CM | POA: Diagnosis not present

## 2015-03-06 DIAGNOSIS — Z5189 Encounter for other specified aftercare: Secondary | ICD-10-CM

## 2015-03-06 MED ORDER — TBO-FILGRASTIM 300 MCG/0.5ML ~~LOC~~ SOSY
300.0000 ug | PREFILLED_SYRINGE | Freq: Every day | SUBCUTANEOUS | Status: DC
  Administered 2015-03-06: 300 ug via SUBCUTANEOUS
  Filled 2015-03-06: qty 0.5

## 2015-03-06 NOTE — Telephone Encounter (Signed)
Pt signed consents for Revlimid and enrolled in [place] program.  Manuel [name] #[id number].   Rx given to Raquel in managed care dept for prior auth.

## 2015-03-07 ENCOUNTER — Telehealth: Payer: Self-pay | Admitting: *Deleted

## 2015-03-07 ENCOUNTER — Ambulatory Visit (HOSPITAL_BASED_OUTPATIENT_CLINIC_OR_DEPARTMENT_OTHER): Payer: Medicare PPO

## 2015-03-07 ENCOUNTER — Encounter: Payer: Self-pay | Admitting: Hematology and Oncology

## 2015-03-07 VITALS — BP 108/60 | HR 105 | Temp 98.3°F

## 2015-03-07 DIAGNOSIS — C8310 Mantle cell lymphoma, unspecified site: Secondary | ICD-10-CM

## 2015-03-07 DIAGNOSIS — Z5189 Encounter for other specified aftercare: Secondary | ICD-10-CM | POA: Diagnosis not present

## 2015-03-07 MED ORDER — TBO-FILGRASTIM 300 MCG/0.5ML ~~LOC~~ SOSY
300.0000 ug | PREFILLED_SYRINGE | Freq: Once | SUBCUTANEOUS | Status: AC
  Administered 2015-03-07: 300 ug via SUBCUTANEOUS
  Filled 2015-03-07: qty 0.5

## 2015-03-07 NOTE — Progress Notes (Signed)
I faxed biologics req for revlimid [phone]

## 2015-03-07 NOTE — Telephone Encounter (Signed)
Karie Schwalbe at [employer] called to notify nurse this patient cannot be serviced through [place] for Revlimid.  Revlimid must be sent to [facility].  Will notify [facility].

## 2015-03-08 ENCOUNTER — Ambulatory Visit (HOSPITAL_BASED_OUTPATIENT_CLINIC_OR_DEPARTMENT_OTHER): Payer: Medicare PPO

## 2015-03-08 ENCOUNTER — Ambulatory Visit (HOSPITAL_BASED_OUTPATIENT_CLINIC_OR_DEPARTMENT_OTHER): Payer: Medicare PPO | Admitting: Hematology and Oncology

## 2015-03-08 ENCOUNTER — Other Ambulatory Visit: Payer: Self-pay | Admitting: *Deleted

## 2015-03-08 ENCOUNTER — Telehealth: Payer: Self-pay | Admitting: Hematology and Oncology

## 2015-03-08 ENCOUNTER — Telehealth: Payer: Self-pay | Admitting: *Deleted

## 2015-03-08 ENCOUNTER — Other Ambulatory Visit: Payer: Self-pay | Admitting: Hematology and Oncology

## 2015-03-08 ENCOUNTER — Encounter: Payer: Self-pay | Admitting: Hematology and Oncology

## 2015-03-08 ENCOUNTER — Ambulatory Visit: Payer: Medicare PPO

## 2015-03-08 VITALS — BP 116/58 | HR 105 | Temp 98.7°F | Resp 17 | Ht 75.0 in | Wt 199.8 lb

## 2015-03-08 VITALS — BP 116/58 | HR 105 | Temp 98.7°F

## 2015-03-08 DIAGNOSIS — C8310 Mantle cell lymphoma, unspecified site: Secondary | ICD-10-CM

## 2015-03-08 DIAGNOSIS — D630 Anemia in neoplastic disease: Secondary | ICD-10-CM

## 2015-03-08 DIAGNOSIS — D6959 Other secondary thrombocytopenia: Secondary | ICD-10-CM

## 2015-03-08 DIAGNOSIS — T451X5A Adverse effect of antineoplastic and immunosuppressive drugs, initial encounter: Secondary | ICD-10-CM

## 2015-03-08 DIAGNOSIS — D701 Agranulocytosis secondary to cancer chemotherapy: Secondary | ICD-10-CM | POA: Diagnosis not present

## 2015-03-08 DIAGNOSIS — R5383 Other fatigue: Secondary | ICD-10-CM

## 2015-03-08 DIAGNOSIS — Z5189 Encounter for other specified aftercare: Secondary | ICD-10-CM

## 2015-03-08 DIAGNOSIS — R112 Nausea with vomiting, unspecified: Secondary | ICD-10-CM | POA: Insufficient documentation

## 2015-03-08 DIAGNOSIS — D6481 Anemia due to antineoplastic chemotherapy: Secondary | ICD-10-CM

## 2015-03-08 DIAGNOSIS — M1000 Idiopathic gout, unspecified site: Secondary | ICD-10-CM

## 2015-03-08 DIAGNOSIS — D696 Thrombocytopenia, unspecified: Secondary | ICD-10-CM

## 2015-03-08 DIAGNOSIS — R197 Diarrhea, unspecified: Secondary | ICD-10-CM

## 2015-03-08 LAB — HOLD TUBE, BLOOD BANK

## 2015-03-08 LAB — COMPREHENSIVE METABOLIC PANEL (CC13)
ALBUMIN: 3.4 g/dL — AB (ref 3.5–5.0)
ALT: 12 U/L (ref 0–55)
ANION GAP: 6 meq/L (ref 3–11)
AST: 7 U/L (ref 5–34)
Alkaline Phosphatase: 74 U/L (ref 40–150)
BUN: 11.3 mg/dL (ref 7.0–26.0)
CALCIUM: 8.6 mg/dL (ref 8.4–10.4)
CHLORIDE: 108 meq/L (ref 98–109)
CO2: 24 mEq/L (ref 22–29)
Creatinine: 1.2 mg/dL (ref 0.7–1.3)
EGFR: 58 mL/min/{1.73_m2} — ABNORMAL LOW (ref >90)
Glucose: 105 mg/dl (ref 70–140)
Potassium: 3.7 mEq/L (ref 3.5–5.1)
SODIUM: 138 meq/L (ref 136–145)
Total Bilirubin: 0.67 mg/dL (ref 0.20–1.20)
Total Protein: 5.9 g/dL — ABNORMAL LOW (ref 6.4–8.3)

## 2015-03-08 LAB — CBC WITH DIFFERENTIAL/PLATELET
BASO%: 1.7 % (ref 0.0–2.0)
Basophils Absolute: 0 10*3/uL (ref 0.0–0.1)
EOS%: 22.7 % — ABNORMAL HIGH (ref 0.0–7.0)
Eosinophils Absolute: 0.3 10*3/uL (ref 0.0–0.5)
HCT: 28 % — ABNORMAL LOW (ref 38.4–49.9)
HGB: 9.7 g/dL — ABNORMAL LOW (ref 13.0–17.1)
LYMPH%: 26.1 % (ref 14.0–49.0)
MCH: 30.7 pg (ref 27.2–33.4)
MCHC: 34.6 g/dL (ref 32.0–36.0)
MCV: 88.6 fL (ref 79.3–98.0)
MONO#: 0.2 10*3/uL (ref 0.1–0.9)
MONO%: 16.8 % — ABNORMAL HIGH (ref 0.0–14.0)
NEUT#: 0.4 10*3/uL — CL (ref 1.5–6.5)
NEUT%: 32.7 % — ABNORMAL LOW (ref 39.0–75.0)
NRBC: 0 % (ref 0–0)
PLATELETS: 59 10*3/uL — AB (ref 140–400)
RBC: 3.16 10*6/uL — ABNORMAL LOW (ref 4.20–5.82)
RDW: 15.8 % — ABNORMAL HIGH (ref 11.0–14.6)
WBC: 1.2 10*3/uL — ABNORMAL LOW (ref 4.0–10.3)
lymph#: 0.3 10*3/uL — ABNORMAL LOW (ref 0.9–3.3)

## 2015-03-08 LAB — LACTATE DEHYDROGENASE (CC13): LDH: 139 U/L (ref 125–245)

## 2015-03-08 MED ORDER — SODIUM CHLORIDE 0.9 % IV SOLN
Freq: Once | INTRAVENOUS | Status: AC
  Administered 2015-03-08: 10:00:00 via INTRAVENOUS

## 2015-03-08 MED ORDER — HEPARIN SOD (PORK) LOCK FLUSH 100 UNIT/ML IV SOLN
500.0000 [IU] | Freq: Once | INTRAVENOUS | Status: AC | PRN
  Administered 2015-03-08: 500 [IU]
  Filled 2015-03-08: qty 5

## 2015-03-08 MED ORDER — SODIUM CHLORIDE 0.9 % IV SOLN
Freq: Once | INTRAVENOUS | Status: AC
  Administered 2015-03-08: 11:00:00 via INTRAVENOUS
  Filled 2015-03-08: qty 4

## 2015-03-08 MED ORDER — TBO-FILGRASTIM 300 MCG/0.5ML ~~LOC~~ SOSY
300.0000 ug | PREFILLED_SYRINGE | Freq: Every day | SUBCUTANEOUS | Status: DC
  Administered 2015-03-08: 300 ug via SUBCUTANEOUS
  Filled 2015-03-08: qty 0.5

## 2015-03-08 MED ORDER — SODIUM CHLORIDE 0.9 % IJ SOLN
10.0000 mL | INTRAMUSCULAR | Status: DC | PRN
  Administered 2015-03-08: 10 mL
  Filled 2015-03-08: qty 10

## 2015-03-08 MED ORDER — ONDANSETRON HCL 8 MG PO TABS: 8.0000 mg | ORAL_TABLET | Freq: Three times a day (TID) | ORAL | Status: AC

## 2015-03-08 NOTE — Assessment & Plan Note (Signed)
He has profound chemotherapy-induced nausea and vomiting. I review his medication list with his daughter. I recommend round-the-clock Zofran over the next few days. I recommend the patient to stay for IV fluids and IV anti-medics today and tomorrow.

## 2015-03-08 NOTE — Progress Notes (Signed)
Daughter requested order for Home Health Nurse for pt due to his decline in condition.  Order placed and left VM for homecare coordinator, Lurlean Leyden.

## 2015-03-08 NOTE — Assessment & Plan Note (Signed)
This is likely due to recent treatment. The patient denies recent history of bleeding such as epistaxis, hematuria or hematochezia. He is asymptomatic from the anemia. I will observe for now.    

## 2015-03-08 NOTE — Progress Notes (Signed)
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 In addition, there is moderate splenomegaly with associated hypermetabolic activity consistent with lymphomatous involvement   09/21/2013 - 01/26/2014 Chemotherapy The patient received 6 cycles of bendamustine and rituximab   01/24/2014 Imaging Repeat PET/CT scan showed near complete response to treatment.   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   07/13/2014 Imaging  repeat PET CT scan show complete remission   08/10/2014 Imaging  repeat CT scan of the neck, chest and abdomen show disease relapse   08/16/2014 - 08/22/2014 Hospital Admission He was admitted to the hospital because of respiratory failure, signs of sepsis and severe pancytopenia   08/17/2014 - 01/24/2015 Chemotherapy He was started on Ibrutinib and high dose prednisone, treatment stopped due to recurrent disease   10/24/2014 Imaging Repeat PET scan show marked reduction in splenomegaly and resolving hypermetabolic activity   2/35/3614 Imaging ECHO showed normal EF   01/25/2015 Imaging CT scan of chest, abdomen and pelvis  showed recurrent splenomegaly and lymphadenopathy   01/28/2015 - 02/01/2015 Hospital Admission He was admitted to the hospital for cycle one of [place]   02/10/2015 - 02/14/2015 Hospital Admission He was admitted to the hospital for neutropenic fever and received anti-biotics and blood transfusion   02/25/2015 - 03/01/2015 Hospital Admission He was admitted to the hospital to receive cycle 2 R-EPOCH    INTERVAL HISTORY: Please see below for problem oriented charting. He is seen urgently today because he has not been feeling well. The patient is not eating, sleeping all the time, complain of profound fatigue and new onset of diarrhea. He also has severe, recurrent nausea and vomiting over the last few days. Yesterday, He had 4-5 bouts of diarrhea. He complained of profound weakness.  REVIEW OF SYSTEMS:   Constitutional: Denies fevers, chills or abnormal weight loss Eyes: Denies blurriness of vision Ears, nose, mouth, throat, and face: Denies mucositis or sore throat Respiratory: Denies cough, dyspnea or wheezes Cardiovascular: Denies palpitation, chest discomfort or lower extremity swelling Skin: Denies abnormal skin rashes Lymphatics: Denies new lymphadenopathy or easy bruising Behavioral/Psych: Mood is stable, no new changes  All other systems were reviewed with the patient and are negative.  I have reviewed the past medical history, past surgical history, social history and family history with the patient and they are unchanged from previous note.  ALLERGIES:  has No Known Allergies.  MEDICATIONS:  Current Outpatient Prescriptions  Medication Sig Dispense Refill  . acetaminophen (TYLENOL) 500 MG tablet Take 500 mg by mouth every 6 (six) hours as needed for moderate pain or fever.     Marland Kitchen acyclovir (ZOVIRAX) 400 MG tablet Take 400 mg by  mouth daily.    . cholecalciferol (VITAMIN D) 1000 UNITS tablet Take 2,000 Units by mouth every morning.    Marland Kitchen levothyroxine (SYNTHROID, LEVOTHROID) 75 MCG  tablet TAKE 1 TABLET EVERY DAY (Patient taking differently: TAKE 75 MCG BY MOUTH DAILY) 90 tablet 6  . loratadine (CLARITIN) 10 MG tablet Take 10 mg by mouth every morning.    Marland Kitchen LORazepam (ATIVAN) 0.5 MG tablet Take 1 tablet (0.5 mg total) by mouth every 6 (six) hours as needed (Nausea or vomiting). 30 tablet 0  . Multiple Vitamin tablet Take 1 tablet by mouth daily.     . ondansetron (ZOFRAN) 8 MG tablet Take 1 tablet (8 mg total) by mouth 2 (two) times daily. Take two times a day starting the day after chemo for 3 days. Then take two times a day as needed for nausea or vomiting. 30 tablet 1  . oxyCODONE (ROXICODONE) 5 MG immediate release tablet Take 1 tablet (5 mg total) by mouth every 4 (four) hours as needed for severe pain. 30 tablet 0  . lenalidomide (REVLIMID) 25 MG capsule Take 1 capsule (25 mg total) by mouth daily. (Patient not taking: Reported on 03/08/2015) 21 capsule 0  . ondansetron (ZOFRAN) 8 MG tablet Take 1 tablet (8 mg total) by mouth 3 (three) times daily. 60 tablet 3  . prochlorperazine (COMPAZINE) 10 MG tablet Take 1 tablet (10 mg total) by mouth every 6 (six) hours as needed (Nausea or vomiting). (Patient not taking: Reported on 03/08/2015) 30 tablet 1  . prochlorperazine (COMPAZINE) 25 MG suppository Place 1 suppository (25 mg total) rectally every 12 (twelve) hours as needed for nausea. (Patient not taking: Reported on 03/08/2015) 12 suppository 3   No current facility-administered medications for this visit.   Facility-Administered Medications Ordered in Other Visits  Medication Dose Route Frequency Provider Last Rate Last Dose  . heparin lock flush 100 unit/mL  500 Units Intracatheter Once PRN Heath Lark, MD      . sodium chloride 0.9 % injection 10 mL  10 mL Intracatheter PRN Heath Lark, MD        PHYSICAL EXAMINATION: ECOG PERFORMANCE STATUS: 3 - Symptomatic, >50% confined to bed  Filed Vitals:   03/08/15 0915  BP: 116/58  Pulse: 105  Temp: 98.7 F (37.1 C)  Resp:  17   Filed Weights   03/08/15 0915  Weight: 199 lb 12.8 oz (90.629 kg)    GENERAL:alert, no distress and comfortable. He looks pale and weak SKIN: skin color, texture, turgor are normal, no rashes or significant lesions EYES: normal, Conjunctiva are pink and non-injected, sclera clear OROPHARYNX:no exudate, no erythema and lips, buccal mucosa, and tongue normal . Dry mucous membrane is noted Musculoskeletal:no cyanosis of digits and no clubbing  NEURO: alert & oriented x 3 with fluent speech, no focal motor/sensory deficits  LABORATORY DATA:  I have reviewed the data as listed    Component Value Date/Time   NA 138 03/08/2015 0902   NA 141 03/01/2015 0405   K 3.7 03/08/2015 0902   K 3.7 03/01/2015 0405   CL 111 03/01/2015 0405   CO2 24 03/08/2015 0902   CO2 24 03/01/2015 0405   GLUCOSE 105 03/08/2015 0902   GLUCOSE 112* 03/01/2015 0405   BUN 11.3 03/08/2015 0902   BUN 18 03/01/2015 0405   CREATININE 1.2 03/08/2015 0902   CREATININE 0.99 03/01/2015 0405   CALCIUM 8.6 03/08/2015 0902   CALCIUM 8.6* 03/01/2015 0405   PROT 5.9* 03/08/2015 0902  PROT 5.5* 03/01/2015 0405   ALBUMIN 3.4* 03/08/2015 0902   ALBUMIN 3.3* 03/01/2015 0405   AST 7 03/08/2015 0902   AST 14* 03/01/2015 0405   ALT 12 03/08/2015 0902   ALT 14* 03/01/2015 0405   ALKPHOS 74 03/08/2015 0902   ALKPHOS 69 03/01/2015 0405   BILITOT 0.67 03/08/2015 0902   BILITOT 0.7 03/01/2015 0405   GFRNONAA >60 03/01/2015 0405   GFRAA >60 03/01/2015 0405    No results found for: SPEP, UPEP  Lab Results  Component Value Date   WBC 1.2* 03/08/2015   NEUTROABS 0.4* 03/08/2015   HGB 9.7* 03/08/2015   HCT 28.0* 03/08/2015   MCV 88.6 03/08/2015   PLT 59* 03/08/2015      Chemistry      Component Value Date/Time   NA 138 03/08/2015 0902   NA 141 03/01/2015 0405   K 3.7 03/08/2015 0902   K 3.7 03/01/2015 0405   CL 111 03/01/2015 0405   CO2 24 03/08/2015 0902   CO2 24 03/01/2015 0405   BUN 11.3 03/08/2015 0902    BUN 18 03/01/2015 0405   CREATININE 1.2 03/08/2015 0902   CREATININE 0.99 03/01/2015 0405      Component Value Date/Time   CALCIUM 8.6 03/08/2015 0902   CALCIUM 8.6* 03/01/2015 0405   ALKPHOS 74 03/08/2015 0902   ALKPHOS 69 03/01/2015 0405   AST 7 03/08/2015 0902   AST 14* 03/01/2015 0405   ALT 12 03/08/2015 0902   ALT 14* 03/01/2015 0405   BILITOT 0.67 03/08/2015 0902   BILITOT 0.7 03/01/2015 0405     ASSESSMENT & PLAN:  Mantle cell lymphoma He tolerated cent treatment very poorly. Clinically, he had remarkable response to treatment. I plan to repeat PET CT scan to assess response to treatment. If he has achieved complete remission, my plan would be to switch him to Rituxan every 60 days along with Revlimid.    Anemia due to antineoplastic chemotherapy This is likely due to recent treatment. The patient denies recent history of bleeding such as epistaxis, hematuria or hematochezia. He is asymptomatic from the anemia. I will observe for now.    Thrombocytopenia This is mild, related to recent treatment. He is not symptomatic. Recommend observation only.    Leukopenia due to antineoplastic chemotherapy He is not symptomatic with no signs of infection. We will continue G-CSF support until [facility] is greater than 1500.  Chemotherapy induced nausea and vomiting He has profound chemotherapy-induced nausea and vomiting. I review his medication list with his daughter. I recommend round-the-clock Zofran over the next few days. I recommend the patient to stay for IV fluids and IV anti-medics today and tomorrow.   No orders of the defined types were placed in this encounter.   All questions were answered. The patient knows to call the clinic with any problems, questions or concerns. No barriers to learning was detected. I spent 25 minutes counseling the patient face to face. The total time spent in the appointment was 30 minutes and more than 50% was on counseling and review  of test results     [facility], [place], MD 03/08/2015 11:25 AM

## 2015-03-08 NOTE — Patient Instructions (Signed)
Dehydration, Adult Dehydration is when you lose more fluids from the body than you take in. Vital organs like the kidneys, brain, and heart cannot function without a proper amount of fluids and salt. Any loss of fluids from the body can cause dehydration.  CAUSES   Vomiting.  Diarrhea.  Excessive sweating.  Excessive urine output.  Fever. SYMPTOMS  Mild dehydration  Thirst.  Dry lips.  Slightly dry mouth. Moderate dehydration  Very dry mouth.  Sunken eyes.  Skin does not bounce back quickly when lightly pinched and released.  Dark urine and decreased urine production.  Decreased tear production.  Headache. Severe dehydration  Very dry mouth.  Extreme thirst.  Rapid, weak pulse (more than 100 beats per minute at rest).  Cold hands and feet.  Not able to sweat in spite of heat and temperature.  Rapid breathing.  Blue lips.  Confusion and lethargy.  Difficulty being awakened.  Minimal urine production.  No tears. DIAGNOSIS  Your caregiver will diagnose dehydration based on your symptoms and your exam. Blood and urine tests will help confirm the diagnosis. The diagnostic evaluation should also identify the cause of dehydration. TREATMENT  Treatment of mild or moderate dehydration can often be done at home by increasing the amount of fluids that you drink. It is best to drink small amounts of fluid more often. Drinking too much at one time can make vomiting worse. Refer to the home care instructions below. Severe dehydration needs to be treated at the hospital where you will probably be given intravenous (IV) fluids that contain water and electrolytes. HOME CARE INSTRUCTIONS   Ask your caregiver about specific rehydration instructions.  Drink enough fluids to keep your urine clear or pale yellow.  Drink small amounts frequently if you have nausea and vomiting.  Eat as you normally do.  Avoid:  Foods or drinks high in sugar.  Carbonated  drinks.  Juice.  Extremely hot or cold fluids.  Drinks with caffeine.  Fatty, greasy foods.  Alcohol.  Tobacco.  Overeating.  Gelatin desserts.  Wash your hands well to avoid spreading bacteria and viruses.  Only take over-the-counter or prescription medicines for pain, discomfort, or fever as directed by your caregiver.  Ask your caregiver if you should continue all prescribed and over-the-counter medicines.  Keep all follow-up appointments with your caregiver. SEEK MEDICAL CARE IF:  You have abdominal pain and it increases or stays in one area (localizes).  You have a rash, stiff neck, or severe headache.  You are irritable, sleepy, or difficult to awaken.  You are weak, dizzy, or extremely thirsty. SEEK IMMEDIATE MEDICAL CARE IF:   You are unable to keep fluids down or you get worse despite treatment.  You have frequent episodes of vomiting or diarrhea.  You have blood or green matter (bile) in your vomit.  You have blood in your stool or your stool looks black and tarry.  You have not urinated in 6 to 8 hours, or you have only urinated a small amount of very dark urine.  You have a fever.  You faint. MAKE SURE YOU:   Understand these instructions.  Will watch your condition.  Will get help right away if you are not doing well or get worse. Document Released: 07/20/2005 Document Revised: 10/12/2011 Document Reviewed: 03/09/2011 ExitCare Patient Information 2015 [employer]. This information is not intended to replace advice given to you by your health care provider. Make sure you discuss any questions you have with your health care   provider.  

## 2015-03-08 NOTE — Telephone Encounter (Signed)
Per staff message and POF I have scheduled appts. Advised scheduler of appts. And no available on 8/8 for treatment  JMW

## 2015-03-08 NOTE — Progress Notes (Signed)
See prev notes.  revlimid has to go thru [facility]

## 2015-03-08 NOTE — Assessment & Plan Note (Signed)
He tolerated cent treatment very poorly. Clinically, he had remarkable response to treatment. I plan to repeat PET CT scan to assess response to treatment. If he has achieved complete remission, my plan would be to switch him to Rituxan every 60 days along with Revlimid.

## 2015-03-08 NOTE — Telephone Encounter (Signed)
per pof to sch pt IVF today, Sat Monday to friday-sent MW email to sch-advised spouse we will call with appt if not ready b4 leaving trmt room-pt aware of appts

## 2015-03-08 NOTE — Assessment & Plan Note (Signed)
This is mild, related to recent treatment. He is not symptomatic. Recommend observation only.

## 2015-03-08 NOTE — Progress Notes (Signed)
Lukis here for injection.  Daughter is with him and states that she has seen a decline in him.  Has been laying in the bed most of the time for the last 2-3 days.  He had a bout of diarrhea last night which was help with Imodium.  Hasn't returned this morning.  He also is having chills but no temperature.  She feels like it would be a good idea to draw his blood today instead of waiting til Monday.  Note left on Dr Calton Dach desk for when she comes in this morning.  Patient waiting in lobby until she come in.

## 2015-03-08 NOTE — Assessment & Plan Note (Signed)
He is not symptomatic with no signs of infection. We will continue G-CSF support until [facility] is greater than 1500.

## 2015-03-08 NOTE — Telephone Encounter (Signed)
per reply from Sharyn Lull W-Pt meneeds to be sch @ [facility] on Monday 8/8-sent staff message to [place] to sch and call pt with time date/location

## 2015-03-09 ENCOUNTER — Ambulatory Visit (HOSPITAL_BASED_OUTPATIENT_CLINIC_OR_DEPARTMENT_OTHER): Payer: Medicare PPO

## 2015-03-09 VITALS — BP 114/59 | HR 93 | Temp 98.3°F | Resp 20

## 2015-03-09 DIAGNOSIS — C8310 Mantle cell lymphoma, unspecified site: Secondary | ICD-10-CM | POA: Diagnosis not present

## 2015-03-09 MED ORDER — SODIUM CHLORIDE 0.9 % IJ SOLN
10.0000 mL | INTRAMUSCULAR | Status: DC | PRN
  Administered 2015-03-09: 10 mL
  Filled 2015-03-09: qty 10

## 2015-03-09 MED ORDER — SODIUM CHLORIDE 0.9 % IV SOLN
Freq: Once | INTRAVENOUS | Status: AC
  Administered 2015-03-09: 10:00:00 via INTRAVENOUS

## 2015-03-09 MED ORDER — HEPARIN SOD (PORK) LOCK FLUSH 100 UNIT/ML IV SOLN
500.0000 [IU] | Freq: Once | INTRAVENOUS | Status: AC | PRN
  Administered 2015-03-09: 500 [IU]
  Filled 2015-03-09: qty 5

## 2015-03-11 ENCOUNTER — Inpatient Hospital Stay (HOSPITAL_COMMUNITY): Admission: RE | Admit: 2015-03-11 | Payer: Medicare PPO | Source: Ambulatory Visit

## 2015-03-11 ENCOUNTER — Encounter: Payer: Self-pay | Admitting: Hematology and Oncology

## 2015-03-11 ENCOUNTER — Ambulatory Visit (HOSPITAL_BASED_OUTPATIENT_CLINIC_OR_DEPARTMENT_OTHER): Payer: Medicare PPO | Admitting: Hematology and Oncology

## 2015-03-11 ENCOUNTER — Other Ambulatory Visit (HOSPITAL_BASED_OUTPATIENT_CLINIC_OR_DEPARTMENT_OTHER): Payer: Medicare PPO

## 2015-03-11 VITALS — BP 123/53 | HR 88 | Temp 98.5°F | Resp 18 | Ht 75.0 in | Wt 201.6 lb

## 2015-03-11 DIAGNOSIS — D630 Anemia in neoplastic disease: Secondary | ICD-10-CM

## 2015-03-11 DIAGNOSIS — D696 Thrombocytopenia, unspecified: Secondary | ICD-10-CM

## 2015-03-11 DIAGNOSIS — K521 Toxic gastroenteritis and colitis: Secondary | ICD-10-CM | POA: Insufficient documentation

## 2015-03-11 DIAGNOSIS — D701 Agranulocytosis secondary to cancer chemotherapy: Secondary | ICD-10-CM

## 2015-03-11 DIAGNOSIS — C8310 Mantle cell lymphoma, unspecified site: Secondary | ICD-10-CM

## 2015-03-11 DIAGNOSIS — R112 Nausea with vomiting, unspecified: Secondary | ICD-10-CM

## 2015-03-11 DIAGNOSIS — T451X5A Adverse effect of antineoplastic and immunosuppressive drugs, initial encounter: Secondary | ICD-10-CM

## 2015-03-11 DIAGNOSIS — D6481 Anemia due to antineoplastic chemotherapy: Secondary | ICD-10-CM | POA: Diagnosis not present

## 2015-03-11 LAB — COMPREHENSIVE METABOLIC PANEL (CC13)
ALBUMIN: 3.1 g/dL — AB (ref 3.5–5.0)
ALT: 21 U/L (ref 0–55)
AST: 13 U/L (ref 5–34)
Alkaline Phosphatase: 78 U/L (ref 40–150)
Anion Gap: 10 mEq/L (ref 3–11)
BUN: 8.2 mg/dL (ref 7.0–26.0)
CHLORIDE: 109 meq/L (ref 98–109)
CO2: 25 mEq/L (ref 22–29)
Calcium: 8.9 mg/dL (ref 8.4–10.4)
Creatinine: 1.4 mg/dL — ABNORMAL HIGH (ref 0.7–1.3)
EGFR: 49 mL/min/{1.73_m2} — ABNORMAL LOW (ref >90)
GLUCOSE: 119 mg/dL (ref 70–140)
Potassium: 4 mEq/L (ref 3.5–5.1)
Sodium: 143 mEq/L (ref 136–145)
Total Bilirubin: 0.37 mg/dL (ref 0.20–1.20)
Total Protein: 5.9 g/dL — ABNORMAL LOW (ref 6.4–8.3)

## 2015-03-11 LAB — CBC WITH DIFFERENTIAL/PLATELET
BASO%: 1.8 % (ref 0.0–2.0)
Basophils Absolute: 0.1 10*3/uL (ref 0.0–0.1)
EOS%: 15.1 % — AB (ref 0.0–7.0)
Eosinophils Absolute: 0.4 10*3/uL (ref 0.0–0.5)
HCT: 27.5 % — ABNORMAL LOW (ref 38.4–49.9)
HEMOGLOBIN: 9.2 g/dL — AB (ref 13.0–17.1)
LYMPH%: 10.7 % — ABNORMAL LOW (ref 14.0–49.0)
MCH: 30 pg (ref 27.2–33.4)
MCHC: 33.5 g/dL (ref 32.0–36.0)
MCV: 89.8 fL (ref 79.3–98.0)
MONO#: 0.5 10*3/uL (ref 0.1–0.9)
MONO%: 16.7 % — AB (ref 0.0–14.0)
NEUT%: 55.7 % (ref 39.0–75.0)
NEUTROS ABS: 1.6 10*3/uL (ref 1.5–6.5)
PLATELETS: 96 10*3/uL — AB (ref 140–400)
RBC: 3.07 10*6/uL — AB (ref 4.20–5.82)
RDW: 17.2 % — AB (ref 11.0–14.6)
WBC: 2.9 10*3/uL — ABNORMAL LOW (ref 4.0–10.3)
lymph#: 0.3 10*3/uL — ABNORMAL LOW (ref 0.9–3.3)

## 2015-03-11 LAB — HOLD TUBE, BLOOD BANK

## 2015-03-11 NOTE — Progress Notes (Signed)
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 In addition, there is moderate splenomegaly with associated hypermetabolic activity consistent with lymphomatous involvement   09/21/2013 - 01/26/2014 Chemotherapy The patient received 6 cycles of bendamustine and rituximab   01/24/2014 Imaging Repeat PET/CT scan showed near complete response to treatment.   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   07/13/2014 Imaging  repeat PET CT scan show complete remission   08/10/2014 Imaging  repeat CT scan of the neck, chest and abdomen show disease relapse   08/16/2014 - 08/22/2014 Hospital Admission He was admitted to the hospital because of respiratory failure, signs of sepsis and severe pancytopenia   08/17/2014 - 01/24/2015 Chemotherapy He was started on Ibrutinib and high dose prednisone, treatment stopped due to recurrent disease   10/24/2014 Imaging Repeat PET scan show marked reduction in splenomegaly and resolving hypermetabolic activity   11/09/8117 Imaging ECHO showed normal EF   01/25/2015 Imaging CT scan of chest, abdomen and pelvis  showed recurrent splenomegaly and lymphadenopathy   01/28/2015 - 02/01/2015 Hospital Admission He was admitted to the hospital for cycle one of [place]   02/10/2015 - 02/14/2015 Hospital Admission He was admitted to the hospital for neutropenic fever and received anti-biotics and blood transfusion   02/25/2015 - 03/01/2015 Hospital Admission He was admitted to the hospital to receive cycle 2 R-EPOCH    INTERVAL HISTORY: Please see below for problem oriented charting. He returns for further follow-up. Over the weekend, he denies vomiting. Nausea is under control with around-the-clock Zofran. He continues to have diarrhea but less compared to last week. He had one episode of fever, resolved with Tylenol. Denies further fevers after that. He has poor appetite but is able to eat 3 times a day.  REVIEW OF SYSTEMS:   Constitutional: Denies abnormal weight loss Eyes: Denies blurriness of vision Ears, nose, mouth, throat, and face: Denies mucositis or sore throat Respiratory: Denies cough, dyspnea or wheezes Cardiovascular: Denies palpitation, chest discomfort or lower extremity swelling Skin: Denies abnormal skin rashes Lymphatics: Denies new lymphadenopathy or easy bruising Neurological:Denies numbness, tingling or new weaknesses Behavioral/Psych: Mood is stable, no new changes  All other systems were reviewed with the patient and are negative.  I have reviewed the past medical history, past surgical history, social history and family history with the patient and they are unchanged from previous note.  ALLERGIES:  has No Known Allergies.  MEDICATIONS:  Current Outpatient Prescriptions  Medication Sig Dispense Refill  . acetaminophen (TYLENOL) 500 MG tablet Take 500 mg by mouth every 6 (six) hours as needed for moderate pain or fever.     Marland Kitchen acyclovir (ZOVIRAX) 400 MG tablet  Take 400 mg by mouth daily.    . cholecalciferol (VITAMIN D) 1000 UNITS tablet Take 2,000 Units by mouth every morning.     Marland Kitchen levothyroxine (SYNTHROID, LEVOTHROID) 75 MCG tablet TAKE 1 TABLET EVERY DAY (Patient taking differently: TAKE 75 MCG BY MOUTH DAILY) 90 tablet 6  . loratadine (CLARITIN) 10 MG tablet Take 10 mg by mouth every morning.    Marland Kitchen LORazepam (ATIVAN) 0.5 MG tablet Take 1 tablet (0.5 mg total) by mouth every 6 (six) hours as needed (Nausea or vomiting). 30 tablet 0  . Multiple Vitamin tablet Take 1 tablet by mouth daily.     . ondansetron (ZOFRAN) 8 MG tablet Take 1 tablet (8 mg total) by mouth 2 (two) times daily. Take two times a day starting the day after chemo for 3 days. Then take two times a day as needed for nausea or vomiting. 30 tablet 1  . ondansetron (ZOFRAN) 8 MG tablet Take 1 tablet (8 mg total) by mouth 3 (three) times daily. 60 tablet 3  . oxyCODONE (ROXICODONE) 5 MG immediate release tablet Take 1 tablet (5 mg total) by mouth every 4 (four) hours as needed for severe pain. 30 tablet 0  . prochlorperazine (COMPAZINE) 10 MG tablet Take 1 tablet (10 mg total) by mouth every 6 (six) hours as needed (Nausea or vomiting). 30 tablet 1  . prochlorperazine (COMPAZINE) 25 MG suppository Place 1 suppository (25 mg total) rectally every 12 (twelve) hours as needed for nausea. 12 suppository 3  . lenalidomide (REVLIMID) 25 MG capsule Take 1 capsule (25 mg total) by mouth daily. (Patient not taking: Reported on 03/11/2015) 21 capsule 0   No current facility-administered medications for this visit.    PHYSICAL EXAMINATION: ECOG PERFORMANCE STATUS: 1 - Symptomatic but completely ambulatory  Filed Vitals:   03/11/15 0844  BP: 123/53  Pulse: 88  Temp: 98.5 F (36.9 C)  Resp: 18   Filed Weights   03/11/15 0844  Weight: 201 lb 9.6 oz (91.445 kg)    GENERAL:alert, no distress and comfortable SKIN: skin color, texture, turgor are normal, no rashes or significant lesions EYES: normal, Conjunctiva are pink and non-injected, sclera clear OROPHARYNX:no exudate, no erythema and lips, buccal mucosa, and  tongue normal  Musculoskeletal:no cyanosis of digits and no clubbing  NEURO: alert & oriented x 3 with fluent speech, no focal motor/sensory deficits  LABORATORY DATA:  I have reviewed the data as listed    Component Value Date/Time   NA 143 03/11/2015 0821   NA 141 03/01/2015 0405   K 4.0 03/11/2015 0821   K 3.7 03/01/2015 0405   CL 111 03/01/2015 0405   CO2 25 03/11/2015 0821   CO2 24 03/01/2015 0405   GLUCOSE 119 03/11/2015 0821   GLUCOSE 112* 03/01/2015 0405   BUN 8.2 03/11/2015 0821   BUN 18 03/01/2015 0405   CREATININE 1.4* 03/11/2015 0821   CREATININE 0.99 03/01/2015 0405   CALCIUM 8.9 03/11/2015 0821   CALCIUM 8.6* 03/01/2015 0405   PROT 5.9* 03/11/2015 0821   PROT 5.5* 03/01/2015 0405   ALBUMIN 3.1* 03/11/2015 0821   ALBUMIN 3.3* 03/01/2015 0405   AST 13 03/11/2015 0821   AST 14* 03/01/2015 0405   ALT 21 03/11/2015 0821   ALT 14* 03/01/2015 0405   ALKPHOS 78 03/11/2015 0821   ALKPHOS 69 03/01/2015 0405   BILITOT 0.37 03/11/2015 0821   BILITOT 0.7 03/01/2015 0405   GFRNONAA >60 03/01/2015 0405   GFRAA >60 03/01/2015 0405  No results found for: SPEP, UPEP  Lab Results  Component Value Date   WBC 2.9* 03/11/2015   NEUTROABS 1.6 03/11/2015   HGB 9.2* 03/11/2015   HCT 27.5* 03/11/2015   MCV 89.8 03/11/2015   PLT 96* 03/11/2015      Chemistry      Component Value Date/Time   NA 143 03/11/2015 0821   NA 141 03/01/2015 0405   K 4.0 03/11/2015 0821   K 3.7 03/01/2015 0405   CL 111 03/01/2015 0405   CO2 25 03/11/2015 0821   CO2 24 03/01/2015 0405   BUN 8.2 03/11/2015 0821   BUN 18 03/01/2015 0405   CREATININE 1.4* 03/11/2015 0821   CREATININE 0.99 03/01/2015 0405      Component Value Date/Time   CALCIUM 8.9 03/11/2015 0821   CALCIUM 8.6* 03/01/2015 0405   ALKPHOS 78 03/11/2015 0821   ALKPHOS 69 03/01/2015 0405   AST 13 03/11/2015 0821   AST 14* 03/01/2015 0405   ALT 21 03/11/2015 0821   ALT 14* 03/01/2015 0405   BILITOT 0.37 03/11/2015 0821    BILITOT 0.7 03/01/2015 0405      ASSESSMENT & PLAN:  Mantle cell lymphoma He tolerated recent treatment very poorly. Clinically, he had remarkable response to treatment. I plan to repeat PET CT scan to assess response to treatment. If he has achieved complete remission, my plan would be to switch him to Rituxan every 60 days along with Revlimid.    Anemia due to antineoplastic chemotherapy This is likely due to recent treatment. The patient denies recent history of bleeding such as epistaxis, hematuria or hematochezia. He is asymptomatic from the anemia. I will observe for now.     Chemotherapy induced nausea and vomiting Currently, his nausea is under control. I will stop IV fluids. I recommend he continue Zofran 8 mg 3 times a day and to reduce Compazine. He can slowly wean Zofran off if his nausea continues to improve  Diarrhea due to drug This is related to recent side effects of chemotherapy. His diarrhea had is under control with Imodium. I recommend he drinks plenty of liquids to prevent dehydration and slowly wean off Imodium.  Leukopenia due to antineoplastic chemotherapy He is not symptomatic with no signs of infection. I will stop G-CSF today.  Thrombocytopenia This is mild, related to recent treatment. He is not symptomatic. Recommend observation only. It is improving.      No orders of the defined types were placed in this encounter.   All questions were answered. The patient knows to call the clinic with any problems, questions or concerns. No barriers to learning was detected. I spent 20 minutes counseling the patient face to face. The total time spent in the appointment was 30 minutes and more than 50% was on counseling and review of test results     [facility], [place], MD 03/11/2015 9:38 AM

## 2015-03-11 NOTE — Assessment & Plan Note (Signed)
This is mild, related to recent treatment. He is not symptomatic. Recommend observation only. It is improving.

## 2015-03-11 NOTE — Assessment & Plan Note (Signed)
He is not symptomatic with no signs of infection. I will stop G-CSF today.

## 2015-03-11 NOTE — Assessment & Plan Note (Signed)
He tolerated recent treatment very poorly. Clinically, he had remarkable response to treatment. I plan to repeat PET CT scan to assess response to treatment. If he has achieved complete remission, my plan would be to switch him to Rituxan every 60 days along with Revlimid.

## 2015-03-11 NOTE — Progress Notes (Signed)
Per Mcarthur Rossetti no Josem Kaufmann is needed for predisone 5mg  tablet

## 2015-03-11 NOTE — Assessment & Plan Note (Signed)
This is likely due to recent treatment. The patient denies recent history of bleeding such as epistaxis, hematuria or hematochezia. He is asymptomatic from the anemia. I will observe for now.    

## 2015-03-11 NOTE — Assessment & Plan Note (Signed)
This is related to recent side effects of chemotherapy. His diarrhea had is under control with Imodium. I recommend he drinks plenty of liquids to prevent dehydration and slowly wean off Imodium.

## 2015-03-11 NOTE — Assessment & Plan Note (Signed)
Currently, his nausea is under control. I will stop IV fluids. I recommend he continue Zofran 8 mg 3 times a day and to reduce Compazine. He can slowly wean Zofran off if his nausea continues to improve

## 2015-03-12 ENCOUNTER — Ambulatory Visit: Payer: Medicare PPO

## 2015-03-13 ENCOUNTER — Ambulatory Visit: Payer: Medicare PPO

## 2015-03-14 ENCOUNTER — Encounter: Payer: Self-pay | Admitting: *Deleted

## 2015-03-14 ENCOUNTER — Ambulatory Visit: Payer: Medicare PPO

## 2015-03-14 NOTE — Progress Notes (Signed)
[place] referral made to [facility] per daughters request.  Received communication they were not able to reach pt or daughter for appointment last week and now received communication that pt refused services.  Dr. Alvy Bimler notified.

## 2015-03-15 ENCOUNTER — Ambulatory Visit: Payer: Medicare PPO

## 2015-03-15 ENCOUNTER — Telehealth: Payer: Self-pay | Admitting: *Deleted

## 2015-03-15 NOTE — Telephone Encounter (Signed)
-----   Message from Heath Lark, MD sent at 03/15/2015 10:25 AM EDT ----- Regarding: Monday appt How is doing? Is he planning to come in Monday? Please call daughter to confirm

## 2015-03-15 NOTE — Telephone Encounter (Signed)
Daughter reports pt is feeling better with less nausea.  She says he doesn't need to see Dr. Alvy Bimler on Monday as scheduled.  Informed her we will cancel his appts on Monday 8/15 and to call us if any problems before next appt.. She verbalized understanding.

## 2015-03-18 ENCOUNTER — Other Ambulatory Visit: Payer: Medicare PPO

## 2015-03-18 ENCOUNTER — Ambulatory Visit: Payer: Medicare PPO | Admitting: Hematology and Oncology

## 2015-03-20 ENCOUNTER — Telehealth: Payer: Self-pay | Admitting: Hematology and Oncology

## 2015-03-20 NOTE — Telephone Encounter (Signed)
Faxed pt medical records to [facility] °

## 2015-03-22 ENCOUNTER — Encounter: Payer: Self-pay | Admitting: Hematology and Oncology

## 2015-03-22 ENCOUNTER — Ambulatory Visit (HOSPITAL_COMMUNITY)
Admit: 2015-03-22 | Discharge: 2015-03-22 | Disposition: A | Discharge status: Home / Self Care | Payer: Medicare PPO | Source: Ambulatory Visit | Attending: Hematology and Oncology | Admitting: Hematology and Oncology

## 2015-03-22 DIAGNOSIS — R161 Splenomegaly, not elsewhere classified: Secondary | ICD-10-CM | POA: Insufficient documentation

## 2015-03-22 DIAGNOSIS — C8310 Mantle cell lymphoma, unspecified site: Secondary | ICD-10-CM | POA: Insufficient documentation

## 2015-03-22 LAB — GLUCOSE, CAPILLARY: Glucose-Capillary: 111 mg/dL — ABNORMAL HIGH (ref 65–99)

## 2015-03-22 MED ORDER — FLUDEOXYGLUCOSE F - 18 (FDG) INJECTION
10.0000 | Freq: Once | INTRAVENOUS | Status: DC | PRN
  Administered 2015-03-22: 10 via INTRAVENOUS
  Filled 2015-03-22: qty 10

## 2015-03-22 NOTE — Progress Notes (Signed)
[employer] approved revlimid 25mg  from 03/22/15-08/03/15

## 2015-03-25 ENCOUNTER — Other Ambulatory Visit: Payer: Self-pay | Admitting: *Deleted

## 2015-03-25 ENCOUNTER — Ambulatory Visit (HOSPITAL_BASED_OUTPATIENT_CLINIC_OR_DEPARTMENT_OTHER): Payer: Medicare PPO

## 2015-03-25 ENCOUNTER — Encounter: Payer: Self-pay | Admitting: Hematology and Oncology

## 2015-03-25 ENCOUNTER — Other Ambulatory Visit: Payer: Self-pay | Admitting: Hematology and Oncology

## 2015-03-25 ENCOUNTER — Telehealth: Payer: Self-pay | Admitting: Hematology and Oncology

## 2015-03-25 ENCOUNTER — Ambulatory Visit: Payer: Medicare PPO

## 2015-03-25 ENCOUNTER — Ambulatory Visit (HOSPITAL_BASED_OUTPATIENT_CLINIC_OR_DEPARTMENT_OTHER): Payer: Medicare PPO | Admitting: Hematology and Oncology

## 2015-03-25 ENCOUNTER — Telehealth: Payer: Self-pay | Admitting: *Deleted

## 2015-03-25 ENCOUNTER — Other Ambulatory Visit (HOSPITAL_BASED_OUTPATIENT_CLINIC_OR_DEPARTMENT_OTHER): Payer: Medicare PPO

## 2015-03-25 VITALS — BP 155/63 | HR 62 | Temp 98.1°F | Resp 18

## 2015-03-25 VITALS — BP 134/73 | HR 74 | Temp 97.6°F | Resp 18 | Ht 75.0 in | Wt 204.5 lb

## 2015-03-25 DIAGNOSIS — D6481 Anemia due to antineoplastic chemotherapy: Secondary | ICD-10-CM

## 2015-03-25 DIAGNOSIS — C8310 Mantle cell lymphoma, unspecified site: Secondary | ICD-10-CM

## 2015-03-25 DIAGNOSIS — D630 Anemia in neoplastic disease: Secondary | ICD-10-CM

## 2015-03-25 DIAGNOSIS — R1012 Left upper quadrant pain: Secondary | ICD-10-CM

## 2015-03-25 DIAGNOSIS — T451X5A Adverse effect of antineoplastic and immunosuppressive drugs, initial encounter: Secondary | ICD-10-CM

## 2015-03-25 DIAGNOSIS — M25512 Pain in left shoulder: Secondary | ICD-10-CM

## 2015-03-25 DIAGNOSIS — R112 Nausea with vomiting, unspecified: Secondary | ICD-10-CM

## 2015-03-25 DIAGNOSIS — Z5112 Encounter for antineoplastic immunotherapy: Secondary | ICD-10-CM

## 2015-03-25 LAB — COMPREHENSIVE METABOLIC PANEL (CC13)
ALBUMIN: 3.6 g/dL (ref 3.5–5.0)
ALK PHOS: 74 U/L (ref 40–150)
ALT: 16 U/L (ref 0–55)
ANION GAP: 10 meq/L (ref 3–11)
AST: 14 U/L (ref 5–34)
BILIRUBIN TOTAL: 0.32 mg/dL (ref 0.20–1.20)
BUN: 12.8 mg/dL (ref 7.0–26.0)
CO2: 25 mEq/L (ref 22–29)
CREATININE: 1.3 mg/dL (ref 0.7–1.3)
Calcium: 9.4 mg/dL (ref 8.4–10.4)
Chloride: 110 mEq/L — ABNORMAL HIGH (ref 98–109)
EGFR: 54 mL/min/{1.73_m2} — AB (ref >90)
Glucose: 111 mg/dl (ref 70–140)
Potassium: 4.4 mEq/L (ref 3.5–5.1)
Sodium: 145 mEq/L (ref 136–145)
TOTAL PROTEIN: 6.1 g/dL — AB (ref 6.4–8.3)

## 2015-03-25 LAB — CBC WITH DIFFERENTIAL/PLATELET
BASO%: 1.4 % (ref 0.0–2.0)
Basophils Absolute: 0.1 10*3/uL (ref 0.0–0.1)
EOS%: 3.2 % (ref 0.0–7.0)
Eosinophils Absolute: 0.2 10*3/uL (ref 0.0–0.5)
HEMATOCRIT: 32.6 % — AB (ref 38.4–49.9)
HEMOGLOBIN: 11.1 g/dL — AB (ref 13.0–17.1)
LYMPH#: 0.5 10*3/uL — AB (ref 0.9–3.3)
LYMPH%: 7.2 % — ABNORMAL LOW (ref 14.0–49.0)
MCH: 31.2 pg (ref 27.2–33.4)
MCHC: 34.1 g/dL (ref 32.0–36.0)
MCV: 91.4 fL (ref 79.3–98.0)
MONO#: 0.9 10*3/uL (ref 0.1–0.9)
MONO%: 12.9 % (ref 0.0–14.0)
NEUT#: 5.5 10*3/uL (ref 1.5–6.5)
NEUT%: 75.3 % — AB (ref 39.0–75.0)
Platelets: 312 10*3/uL (ref 140–400)
RBC: 3.57 10*6/uL — ABNORMAL LOW (ref 4.20–5.82)
RDW: 19.3 % — AB (ref 11.0–14.6)
WBC: 7.3 10*3/uL (ref 4.0–10.3)

## 2015-03-25 LAB — HOLD TUBE, BLOOD BANK

## 2015-03-25 MED ORDER — SODIUM CHLORIDE 0.9 % IJ SOLN
10.0000 mL | INTRAMUSCULAR | Status: DC | PRN
  Administered 2015-03-25: 10 mL
  Filled 2015-03-25: qty 10

## 2015-03-25 MED ORDER — DIPHENHYDRAMINE HCL 25 MG PO CAPS
50.0000 mg | ORAL_CAPSULE | Freq: Once | ORAL | Status: AC
  Administered 2015-03-25: 50 mg via ORAL

## 2015-03-25 MED ORDER — ACETAMINOPHEN 325 MG PO TABS
650.0000 mg | ORAL_TABLET | Freq: Once | ORAL | Status: AC
  Administered 2015-03-25: 650 mg via ORAL

## 2015-03-25 MED ORDER — HEPARIN SOD (PORK) LOCK FLUSH 100 UNIT/ML IV SOLN
500.0000 [IU] | Freq: Once | INTRAVENOUS | Status: AC | PRN
  Administered 2015-03-25: 500 [IU]
  Filled 2015-03-25: qty 5

## 2015-03-25 MED ORDER — SODIUM CHLORIDE 0.9 % IV SOLN
375.0000 mg/m2 | Freq: Once | INTRAVENOUS | Status: AC
  Administered 2015-03-25: 800 mg via INTRAVENOUS
  Filled 2015-03-25: qty 80

## 2015-03-25 MED ORDER — DIPHENHYDRAMINE HCL 25 MG PO CAPS
ORAL_CAPSULE | ORAL | Status: AC
  Filled 2015-03-25: qty 2

## 2015-03-25 MED ORDER — ACETAMINOPHEN 325 MG PO TABS
ORAL_TABLET | ORAL | Status: AC
  Filled 2015-03-25: qty 2

## 2015-03-25 MED ORDER — SODIUM CHLORIDE 0.9 % IV SOLN
Freq: Once | INTRAVENOUS | Status: AC
  Administered 2015-03-25: 10:00:00 via INTRAVENOUS

## 2015-03-25 NOTE — Progress Notes (Signed)
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 In addition, there is moderate splenomegaly with associated hypermetabolic activity consistent with lymphomatous involvement   09/21/2013 - 01/26/2014 Chemotherapy The patient received 6 cycles of bendamustine and rituximab   01/24/2014 Imaging Repeat PET/CT scan showed near complete response to treatment.   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   07/13/2014 Imaging  repeat PET CT scan show complete remission   08/10/2014 Imaging  repeat CT scan of the neck, chest and abdomen show disease relapse   08/16/2014 - 08/22/2014 Hospital Admission He was admitted to the hospital because of respiratory failure, signs of sepsis and severe pancytopenia   08/17/2014 - 01/24/2015 Chemotherapy He was started on Ibrutinib and high dose prednisone, treatment stopped due to recurrent disease   10/24/2014 Imaging Repeat PET scan show marked reduction in splenomegaly and resolving hypermetabolic activity   2/35/3614 Imaging ECHO showed normal EF   01/25/2015 Imaging CT scan of chest, abdomen and pelvis  showed recurrent splenomegaly and lymphadenopathy   01/28/2015 - 02/01/2015 Hospital Admission He was admitted to the hospital for cycle one of [place]   02/10/2015 - 02/14/2015 Hospital Admission He was admitted to the hospital for neutropenic fever and received anti-biotics and blood transfusion   02/25/2015 - 03/01/2015 Hospital Admission He was admitted to the hospital to receive cycle 2 R-EPOCH   03/22/2015 Imaging PET CT scan showed complete remission to treatment   03/25/2015 -  Chemotherapy The patient started to maintenance rituximab every 60 days along with Revlimid    INTERVAL HISTORY: Please see below for problem oriented charting. He returns for further follow-up. He continues to have intermittent left upper quadrant pain. Over the weekend, he had one episode of nausea and vomiting from indigestion. He denies constipation He stopped taking his anti-emetics last weekend when he felt better  REVIEW OF SYSTEMS:   Constitutional: Denies fevers, chills or abnormal weight loss Eyes: Denies blurriness of vision Ears, nose, mouth, throat, and face: Denies mucositis or sore throat Respiratory: Denies cough, dyspnea or wheezes Cardiovascular: Denies palpitation, chest discomfort or lower extremity swelling Skin: Denies abnormal skin rashes Lymphatics: Denies new lymphadenopathy or easy bruising Neurological:Denies numbness, tingling or new weaknesses Behavioral/Psych: Mood is stable, no new changes  All other systems were reviewed with the patient and are negative.  I have reviewed the past medical history, past surgical history, social history and family history with the patient and they are unchanged from previous note.  ALLERGIES:  has No Known Allergies.  MEDICATIONS:  Current Outpatient Prescriptions  Medication Sig Dispense Refill  . acyclovir (ZOVIRAX) 400 MG tablet Take 400 mg by mouth daily.    Marland Kitchen  levothyroxine (SYNTHROID, LEVOTHROID) 75 MCG tablet TAKE 1 TABLET EVERY DAY  (Patient taking differently: TAKE 75 MCG BY MOUTH DAILY) 90 tablet 6  . loratadine (CLARITIN) 10 MG tablet Take 10 mg by mouth every morning.    . ondansetron (ZOFRAN) 8 MG tablet Take 1 tablet (8 mg total) by mouth 3 (three) times daily. 60 tablet 3  . acetaminophen (TYLENOL) 500 MG tablet Take 500 mg by mouth every 6 (six) hours as needed for moderate pain or fever.     . cholecalciferol (VITAMIN D) 1000 UNITS tablet Take 2,000 Units by mouth every morning.    Marland Kitchen lenalidomide (REVLIMID) 25 MG capsule Take 1 capsule (25 mg total) by mouth daily. (Patient not taking: Reported on 03/11/2015) 21 capsule 0  . Multiple Vitamin tablet Take 1 tablet by mouth daily.     Marland Kitchen oxyCODONE (ROXICODONE) 5 MG immediate release tablet Take 1 tablet (5 mg total) by mouth every 4 (four) hours as needed for severe pain. (Patient not taking: Reported on 03/25/2015) 30 tablet 0   No current facility-administered medications for this visit.   Facility-Administered Medications Ordered in Other Visits  Medication Dose Route Frequency Provider Last Rate Last Dose  . fludeoxyglucose F - 18 (FDG) injection [place] Intravenous Once PRN Medication Radiologist, MD   [place] at 03/22/15 [phone]  . heparin lock flush 100 unit/mL  500 Units Intracatheter Once PRN Heath Lark, MD      . riTUXimab (RITUXAN) 800 mg in sodium chloride 0.9 % 250 mL (2.4242 mg/mL) chemo infusion  375 mg/m2 (Treatment Plan Actual) Intravenous Once Heath Lark, MD      . sodium chloride 0.9 % injection 10 mL  10 mL Intracatheter PRN Heath Lark, MD        PHYSICAL EXAMINATION: ECOG PERFORMANCE STATUS: 1 - Symptomatic but completely ambulatory  Filed Vitals:   03/25/15 0850  BP: 134/73  Pulse: 74  Temp: 97.6 F (36.4 C)  Resp: 18   Filed Weights   03/25/15 0850  Weight: 204 lb 8 oz (92.761 kg)    GENERAL:alert, no distress and comfortable SKIN: skin color, texture, turgor are normal, no rashes or significant  lesions EYES: normal, Conjunctiva are pink and non-injected, sclera clear OROPHARYNX:no exudate, no erythema and lips, buccal mucosa, and tongue normal  Musculoskeletal:no cyanosis of digits and no clubbing  NEURO: alert & oriented x 3 with fluent speech, no focal motor/sensory deficits  LABORATORY DATA:  I have reviewed the data as listed    Component Value Date/Time   NA 145 03/25/2015 0830   NA 141 03/01/2015 0405   K 4.4 03/25/2015 0830   K 3.7 03/01/2015 0405   CL 111 03/01/2015 0405   CO2 25 03/25/2015 0830   CO2 24 03/01/2015 0405   GLUCOSE 111 03/25/2015 0830   GLUCOSE 112* 03/01/2015 0405   BUN 12.8 03/25/2015 0830   BUN 18 03/01/2015 0405   CREATININE 1.3 03/25/2015 0830   CREATININE 0.99 03/01/2015 0405   CALCIUM 9.4 03/25/2015 0830   CALCIUM 8.6* 03/01/2015 0405   PROT 6.1* 03/25/2015 0830   PROT 5.5* 03/01/2015 0405   ALBUMIN 3.6 03/25/2015 0830   ALBUMIN 3.3* 03/01/2015 0405   AST 14 03/25/2015 0830   AST 14* 03/01/2015 0405   ALT 16 03/25/2015 0830   ALT 14* 03/01/2015 0405   ALKPHOS 74 03/25/2015 0830   ALKPHOS 69 03/01/2015 0405   BILITOT 0.32 03/25/2015 0830   BILITOT 0.7  03/01/2015 0405   GFRNONAA >60 03/01/2015 0405   GFRAA >60 03/01/2015 0405    No results found for: SPEP, UPEP  Lab Results  Component Value Date   WBC 7.3 03/25/2015   NEUTROABS 5.5 03/25/2015   HGB 11.1* 03/25/2015   HCT 32.6* 03/25/2015   MCV 91.4 03/25/2015   PLT 312 03/25/2015      Chemistry      Component Value Date/Time   NA 145 03/25/2015 0830   NA 141 03/01/2015 0405   K 4.4 03/25/2015 0830   K 3.7 03/01/2015 0405   CL 111 03/01/2015 0405   CO2 25 03/25/2015 0830   CO2 24 03/01/2015 0405   BUN 12.8 03/25/2015 0830   BUN 18 03/01/2015 0405   CREATININE 1.3 03/25/2015 0830   CREATININE 0.99 03/01/2015 0405      Component Value Date/Time   CALCIUM 9.4 03/25/2015 0830   CALCIUM 8.6* 03/01/2015 0405   ALKPHOS 74 03/25/2015 0830   ALKPHOS 69 03/01/2015 0405    AST 14 03/25/2015 0830   AST 14* 03/01/2015 0405   ALT 16 03/25/2015 0830   ALT 14* 03/01/2015 0405   BILITOT 0.32 03/25/2015 0830   BILITOT 0.7 03/01/2015 0405       RADIOGRAPHIC STUDIES: I reviewed the PET CT scan with him and his daughter I have personally reviewed the radiological images as listed and agreed with the findings in the report.   ASSESSMENT & PLAN:  Mantle cell lymphoma He is still experiencing a lot of left upper quadrant discomfort. I showed him and his daughter complete remission based on recent PET scan.  We discussed maintenance treatment with rituximab every other month along with Revlimid. We discussed some of the risk, benefit, side effects of treatment and he agreed to proceed. The patient is aware that he needs weekly blood draw at his local facility with results faxed to me for review. I will see him back in 8 weeks.  Anemia due to antineoplastic chemotherapy This is likely due to recent treatment. The patient denies recent history of bleeding such as epistaxis, hematuria or hematochezia. He is asymptomatic from the anemia. I will observe for now.     Left shoulder pain Cause is unknown, likely due to recent splenomegaly I gave him a prescription of oxycodone to take as needed.   Chemotherapy induced nausea and vomiting Currently, his nausea is under control. I recommend he continue Zofran as needed and I will recommend he takes one daily in the morning before his chemotherapy   No orders of the defined types were placed in this encounter.   All questions were answered. The patient knows to call the clinic with any problems, questions or concerns. No barriers to learning was detected. I spent 30 minutes counseling the patient face to face. The total time spent in the appointment was 40 minutes and more than 50% was on counseling and review of test results     [facility], [place], MD 03/25/2015 10:56 AM

## 2015-03-25 NOTE — Telephone Encounter (Signed)
Gave and printed appt sched and avs fo rpt for OCT °

## 2015-03-25 NOTE — Telephone Encounter (Signed)
Spoke with [employer], they have revlimid ready for delivery. They will call daughter. Daughter notified and [employer] phone number given to daughter

## 2015-03-25 NOTE — Assessment & Plan Note (Addendum)
He is still experiencing a lot of left upper quadrant discomfort. I showed him and his daughter complete remission based on recent PET scan.  We discussed maintenance treatment with rituximab every other month along with Revlimid. We discussed some of the risk, benefit, side effects of treatment and he agreed to proceed. The patient is aware that he needs weekly blood draw at his local facility with results faxed to me for review. I will see him back in 8 weeks.

## 2015-03-25 NOTE — Assessment & Plan Note (Signed)
This is likely due to recent treatment. The patient denies recent history of bleeding such as epistaxis, hematuria or hematochezia. He is asymptomatic from the anemia. I will observe for now.    

## 2015-03-25 NOTE — Progress Notes (Signed)
Called [employer] to add Revlimid to his existing grant.  It was approved from 08/16/14 to 08/16/15 or when the benefit cap has been met. Expenses can be submitted for reimbursement for dos 05/18/14 to 08/16/15. Today the balance on the grant is $6,138.47.

## 2015-03-25 NOTE — Assessment & Plan Note (Signed)
Cause is unknown, likely due to recent splenomegaly I gave him a prescription of oxycodone to take as needed.

## 2015-03-25 NOTE — Telephone Encounter (Signed)
Faxed labs, office notes and PET to Dr Harvel Ricks

## 2015-03-25 NOTE — Assessment & Plan Note (Signed)
Currently, his nausea is under control. I recommend he continue Zofran as needed and I will recommend he takes one daily in the morning before his chemotherapy

## 2015-03-25 NOTE — Patient Instructions (Signed)
[facility] Discharge Instructions for Patients Receiving Chemotherapy  Today you received the following chemotherapy agents: Rituxan   To help prevent nausea and vomiting after your treatment, we encourage you to take your nausea medication as directed.    If you develop nausea and vomiting that is not controlled by your nausea medication, call the clinic.   BELOW ARE SYMPTOMS THAT SHOULD BE REPORTED IMMEDIATELY:  *FEVER GREATER THAN 100.5 F  *CHILLS WITH OR WITHOUT FEVER  NAUSEA AND VOMITING THAT IS NOT CONTROLLED WITH YOUR NAUSEA MEDICATION  *UNUSUAL SHORTNESS OF BREATH  *UNUSUAL BRUISING OR BLEEDING  TENDERNESS IN MOUTH AND THROAT WITH OR WITHOUT PRESENCE OF ULCERS  *URINARY PROBLEMS  *BOWEL PROBLEMS  UNUSUAL RASH Items with * indicate a potential emergency and should be followed up as soon as possible.  Feel free to call the clinic you have any questions or concerns. The clinic phone number is [phone].  Please show the CHEMO ALERT CARD at check-in to the Emergency Department and triage nurse.   

## 2015-04-02 ENCOUNTER — Telehealth: Payer: Self-pay | Admitting: Oncology

## 2015-04-02 ENCOUNTER — Telehealth: Payer: Self-pay | Admitting: Hematology and Oncology

## 2015-04-02 NOTE — Telephone Encounter (Signed)
Faxed pt medical records to [facility]

## 2015-04-04 ENCOUNTER — Telehealth: Payer: Self-pay | Admitting: Hematology and Oncology

## 2015-04-04 NOTE — Telephone Encounter (Signed)
I spoke with the patient and his daughter. I reviewed his test results. He is compliant taking Revlimid at home The patient complained of excessive fatigue, epigastric pain with food and some nausea. I suspect he has significant deconditioning. I recommend graduated exercise as tolerated. I recommend he takes OTC Prilosec/Zantac every morning and to take toms before each meals. I will call him again next week once I have more test results

## 2015-04-05 ENCOUNTER — Encounter: Payer: Self-pay | Admitting: Hematology and Oncology

## 2015-04-05 NOTE — Progress Notes (Signed)
Per andre at [facility] revlimid was delivered on 03/28/15. He will call daugther Brennan Bailey and let her know. See prev notes.

## 2015-04-12 ENCOUNTER — Telehealth: Payer: Self-pay | Admitting: *Deleted

## 2015-04-12 NOTE — Telephone Encounter (Signed)
Spoke with kandice, per dr Alvy Bimler if diarrhea not resolved by the week-end, he is to stop the revlimid,

## 2015-04-12 NOTE — Telephone Encounter (Signed)
I am concerned about risks of dehydration. If diarrhea does not resolve by the weekend, he needs to stop treatment

## 2015-04-12 NOTE — Telephone Encounter (Signed)
Spoke with daughter Manuel [name]. rec'd her father's labs and per dr Manuel [name], they are normal. Manuel [name] states he has had 2 bouts of diarrhea. One last week and one last night. Has imodium, but refuses to take it.

## 2015-04-15 ENCOUNTER — Telehealth: Payer: Self-pay | Admitting: *Deleted

## 2015-04-15 NOTE — Telephone Encounter (Signed)
Dau left VM states pt continued to have diarrhea so he stopped Revlimid.  Last dose was taken on Saturday and pt has a few more left for this cycle.   Dau asks if pt needs to hold for the rest of this cycle or resume the Revlimid at some point?   Per Dr. Alvy Bimler pt is to continue to hold Revlimid as long as he has diarrhea.  She also wants pt to take imodium prn diarrhea.  If his diarrhea clears up then he can resume the Revlimid and finish this cycle.  Attempted to call pt's daughter back and there is no VM set up at this time to leave a message.

## 2015-04-16 NOTE — Telephone Encounter (Signed)
Unable to reach pt's daughter x 2 days.  Her cell phone does not have VM set up.    I called pt directly and he says he has not had any diarrhea for the past 2 days.  He has 4 Revlimid pills left.  I instructed pt to resume Revlimid per Dr. Alvy Bimler, take the last 4 pills/days for this cycle.  Call us if the diarrhea starts again.   Pt verbalized understanding.   He states he also needs refill on Acyclovir and Levothyroxine sent to [facility].

## 2015-04-17 ENCOUNTER — Telehealth: Payer: Self-pay | Admitting: *Deleted

## 2015-04-17 MED ORDER — ACYCLOVIR 400 MG PO TABS: 400.0000 mg | ORAL_TABLET | Freq: Every day | ORAL | Status: AC

## 2015-04-17 MED ORDER — LEVOTHYROXINE SODIUM 75 MCG PO TABS: 75.0000 ug | ORAL_TABLET | Freq: Every day | ORAL | Status: AC

## 2015-04-17 MED ORDER — LEVOTHYROXINE SODIUM 75 MCG PO TABS: 75.0000 ug | ORAL_TABLET | Freq: Every day | ORAL | Status: DC

## 2015-04-17 NOTE — Telephone Encounter (Signed)
Pt's daughter left VM reporting pt has gout.  Dr. Alvy Bimler instructs pt to take Prednisone 20 mg daily for 7 days. Daughter states she thinks pt has enough prednisone at home.  She will call back if he needs refill sent to pharmacy.   Informed that pt had requested refills on acyclovir and levothyroxine and they are sent to [facility] order pharmacy.  She verbalized understanding.

## 2015-04-17 NOTE — Telephone Encounter (Signed)
PLease refill, 30 days supply, 6 refills

## 2015-04-19 ENCOUNTER — Telehealth: Payer: Self-pay | Admitting: *Deleted

## 2015-04-19 MED ORDER — LENALIDOMIDE 15 MG PO CAPS: 15.0000 mg | ORAL_CAPSULE | Freq: Every day | ORAL | Status: DC

## 2015-04-19 NOTE — Telephone Encounter (Signed)
Informed pt CBC results are stable but CMET shows increased Creatinine and indicates dehydration.  Instructed pt to drink more fluids,  Push water.  He verbalized understanding.  Denies any further diarrhea the past few days since restarting the Revlimid.   He has two Revlimid pills left and asks if he should hold or fininsh this cycle?   Dr. Alvy Bimler instructs for pt to complete the last two days of Revlimid.  She is going to decrease the dose to 15 mg for next cycle.  New Rx faxed to [place].  His next cycle will start on 04/28/15.    I called pt back and got his VM. I called his daughter, Freida Busman, and informed her of above.  She verbalized understanding.   She says new Rx for Labs will need to be sent to pt's lab in [place].  His current Rx is good for one more week and pt goes on Wednesdays.

## 2015-04-19 NOTE — Telephone Encounter (Signed)
PT. REQUESTING BLOOD TEST RESULTS.

## 2015-04-24 ENCOUNTER — Telehealth: Payer: Self-pay | Admitting: *Deleted

## 2015-04-24 NOTE — Telephone Encounter (Signed)
VM from pt's daughter states pt was unable to get his labs checked today as the lab said they ran out of orders.  Pt's rx was apparently only for 3 weeks.   Pt is off Revlimid this week.  Due to start next cycle on 9/25.  When does he need labs again?  RN will need to fax new Rx to [facility] in [place] , [place].

## 2015-04-25 ENCOUNTER — Encounter: Payer: Self-pay | Admitting: Hematology and Oncology

## 2015-04-25 ENCOUNTER — Telehealth: Payer: Self-pay | Admitting: *Deleted

## 2015-04-25 NOTE — Telephone Encounter (Signed)
Faxed new Rx to [facility] in [place], [place] at fax [phone],  To draw weekly CBC and CMET starting on 05/01/15. Informed daughter of above and ok to not have labs drawn this week since it is pt's week off Revlimid.  Resume next Wed on 9/28. She verbalized understanding and reports pt had another episode of diarrhea a few days ago when he was off the Revlimid.  He took imodium and it resolved.  Instructed her to let us know if it comes back again. She verbalized understanding.

## 2015-04-25 NOTE — Telephone Encounter (Signed)
It's all right not to check this week since he is off We can fax another letter

## 2015-05-03 ENCOUNTER — Telehealth: Payer: Self-pay | Admitting: *Deleted

## 2015-05-03 NOTE — Telephone Encounter (Signed)
Discussed at length w/ Candy pt's condition.  She is very concerned about his increasing weakness and decreased appetite.  Pt fell yesterday.  He can't drive himself anymore and she worries about him being home alone.  She is going to bring him back to [facility]. She is going to tell pt that Dr. Alvy Bimler wants to see him.  Informed Candy that we still do not have CBC results but I have notified Dr. Alvy Bimler of pt's condition and she instructed pt to hold Revlimid and see his PCP in [facility].  Candy says pt won't go to his doctor and he won't go to the hospital in [facility].  She asks if Dr. Alvy Bimler can see him on Monday?   I informed her Dr. Calton Dach schedule is full so I will have to ask her, but if she is unable to see pt herself he might be able to see our NP in [facility].    Advised Candy to  1. Have pt Hold Revlimid  2. Push oral fluids and potassium rich foods/drinks such as V8 juice, gatorade and orange juice if tolerated.   3.Take pt to his PCP if he stays in [facility].  Take pt to ED if his symptoms worsen or she is very concerned once she arrives in [facility] to get him tomorrow.   4. Call our office Monday morning to ask for appointment.

## 2015-05-03 NOTE — Telephone Encounter (Signed)
Have not received any lab work on pt this week.   I faxed the order to [facility] in [place] last week to start weekly lab on 9/28.  I had notified pt's daughter last week of new order sent.    I tried to call daughter and she does not have VM set up. I called pt's home and left VM asking him to return nurse's call to let us know if he was able to get his lab work done this week and also want to confirm when /if he started Revlimid this cycle.  Also just want to check on his overall well being.

## 2015-05-03 NOTE — Telephone Encounter (Signed)
VM received from pt's daughter, Manuel [name].  She asks if we got lab results.  I tried to call pt again to see if he went to lab this week and there is no answer.   Candy reports pt is "Very Lethargic" and continues to have diarrhea about every 3 rd day.  He is taking imodium as needed. She reports he has "no appetite" and "can't eat."   She says she wanted to bring him back to [place] last weekend but he refused.  She says she thinks he is "failing." I called [facility] [phone].  They did draw blood on pt on 9/28 but all the results are not done yet.  They have CMET but no CBC yet.  It may be a "few more hours" for CBC.  I asked them to fax [place] the results they do have now and the rest when complete.

## 2015-05-04 ENCOUNTER — Emergency Department (HOSPITAL_COMMUNITY): Payer: Medicare PPO

## 2015-05-04 ENCOUNTER — Inpatient Hospital Stay (HOSPITAL_COMMUNITY)
Admission: EM | Admit: 2015-05-04 | Discharge: 2015-05-09 | DRG: 683 | Disposition: A | Discharge status: Hospice | Payer: Medicare PPO | Attending: Internal Medicine | Admitting: Internal Medicine

## 2015-05-04 ENCOUNTER — Encounter (HOSPITAL_COMMUNITY): Payer: Self-pay | Admitting: Emergency Medicine

## 2015-05-04 DIAGNOSIS — R509 Fever, unspecified: Secondary | ICD-10-CM

## 2015-05-04 DIAGNOSIS — I4581 Long QT syndrome: Secondary | ICD-10-CM | POA: Diagnosis present

## 2015-05-04 DIAGNOSIS — M25462 Effusion, left knee: Secondary | ICD-10-CM | POA: Diagnosis present

## 2015-05-04 DIAGNOSIS — E039 Hypothyroidism, unspecified: Secondary | ICD-10-CM | POA: Diagnosis present

## 2015-05-04 DIAGNOSIS — Z79891 Long term (current) use of opiate analgesic: Secondary | ICD-10-CM

## 2015-05-04 DIAGNOSIS — W19XXXA Unspecified fall, initial encounter: Secondary | ICD-10-CM

## 2015-05-04 DIAGNOSIS — D649 Anemia, unspecified: Secondary | ICD-10-CM

## 2015-05-04 DIAGNOSIS — Z87891 Personal history of nicotine dependence: Secondary | ICD-10-CM

## 2015-05-04 DIAGNOSIS — Z807 Family history of other malignant neoplasms of lymphoid, hematopoietic and related tissues: Secondary | ICD-10-CM

## 2015-05-04 DIAGNOSIS — D696 Thrombocytopenia, unspecified: Secondary | ICD-10-CM

## 2015-05-04 DIAGNOSIS — E861 Hypovolemia: Secondary | ICD-10-CM | POA: Diagnosis present

## 2015-05-04 DIAGNOSIS — Z801 Family history of malignant neoplasm of trachea, bronchus and lung: Secondary | ICD-10-CM

## 2015-05-04 DIAGNOSIS — Z6825 Body mass index (BMI) 25.0-25.9, adult: Secondary | ICD-10-CM

## 2015-05-04 DIAGNOSIS — W010XXA Fall on same level from slipping, tripping and stumbling without subsequent striking against object, initial encounter: Secondary | ICD-10-CM | POA: Diagnosis present

## 2015-05-04 DIAGNOSIS — C8312 Mantle cell lymphoma, intrathoracic lymph nodes: Secondary | ICD-10-CM

## 2015-05-04 DIAGNOSIS — I10 Essential (primary) hypertension: Secondary | ICD-10-CM | POA: Diagnosis present

## 2015-05-04 DIAGNOSIS — R6510 Systemic inflammatory response syndrome (SIRS) of non-infectious origin without acute organ dysfunction: Secondary | ICD-10-CM | POA: Diagnosis present

## 2015-05-04 DIAGNOSIS — F329 Major depressive disorder, single episode, unspecified: Secondary | ICD-10-CM | POA: Diagnosis present

## 2015-05-04 DIAGNOSIS — D61818 Other pancytopenia: Secondary | ICD-10-CM | POA: Diagnosis present

## 2015-05-04 DIAGNOSIS — G4700 Insomnia, unspecified: Secondary | ICD-10-CM | POA: Diagnosis present

## 2015-05-04 DIAGNOSIS — N179 Acute kidney failure, unspecified: Principal | ICD-10-CM | POA: Diagnosis present

## 2015-05-04 DIAGNOSIS — R197 Diarrhea, unspecified: Secondary | ICD-10-CM | POA: Insufficient documentation

## 2015-05-04 DIAGNOSIS — E46 Unspecified protein-calorie malnutrition: Secondary | ICD-10-CM | POA: Diagnosis present

## 2015-05-04 DIAGNOSIS — T451X5A Adverse effect of antineoplastic and immunosuppressive drugs, initial encounter: Secondary | ICD-10-CM | POA: Diagnosis present

## 2015-05-04 DIAGNOSIS — A047 Enterocolitis due to Clostridium difficile: Secondary | ICD-10-CM | POA: Diagnosis present

## 2015-05-04 DIAGNOSIS — E440 Moderate protein-calorie malnutrition: Secondary | ICD-10-CM | POA: Insufficient documentation

## 2015-05-04 DIAGNOSIS — M109 Gout, unspecified: Secondary | ICD-10-CM | POA: Diagnosis present

## 2015-05-04 DIAGNOSIS — D6959 Other secondary thrombocytopenia: Secondary | ICD-10-CM | POA: Diagnosis present

## 2015-05-04 DIAGNOSIS — Z803 Family history of malignant neoplasm of breast: Secondary | ICD-10-CM

## 2015-05-04 DIAGNOSIS — E860 Dehydration: Secondary | ICD-10-CM | POA: Diagnosis not present

## 2015-05-04 DIAGNOSIS — R531 Weakness: Secondary | ICD-10-CM

## 2015-05-04 DIAGNOSIS — E876 Hypokalemia: Secondary | ICD-10-CM | POA: Diagnosis present

## 2015-05-04 DIAGNOSIS — R7989 Other specified abnormal findings of blood chemistry: Secondary | ICD-10-CM

## 2015-05-04 DIAGNOSIS — R9431 Abnormal electrocardiogram [ECG] [EKG]: Secondary | ICD-10-CM | POA: Diagnosis present

## 2015-05-04 DIAGNOSIS — E785 Hyperlipidemia, unspecified: Secondary | ICD-10-CM | POA: Diagnosis present

## 2015-05-04 DIAGNOSIS — C8310 Mantle cell lymphoma, unspecified site: Secondary | ICD-10-CM

## 2015-05-04 DIAGNOSIS — Z66 Do not resuscitate: Secondary | ICD-10-CM | POA: Diagnosis not present

## 2015-05-04 DIAGNOSIS — R5081 Fever presenting with conditions classified elsewhere: Secondary | ICD-10-CM | POA: Diagnosis present

## 2015-05-04 DIAGNOSIS — K769 Liver disease, unspecified: Secondary | ICD-10-CM | POA: Diagnosis present

## 2015-05-04 DIAGNOSIS — Z79899 Other long term (current) drug therapy: Secondary | ICD-10-CM

## 2015-05-04 DIAGNOSIS — Z515 Encounter for palliative care: Secondary | ICD-10-CM | POA: Diagnosis not present

## 2015-05-04 LAB — CBC WITH DIFFERENTIAL/PLATELET
Band Neutrophils: 3 %
Basophils Absolute: 0 K/uL (ref 0.0–0.1)
Basophils Relative: 0 %
Blasts: 0 %
Eosinophils Absolute: 0.2 K/uL (ref 0.0–0.7)
Eosinophils Relative: 4 %
HCT: 24.4 % — ABNORMAL LOW (ref 39.0–52.0)
Hemoglobin: 8.1 g/dL — ABNORMAL LOW (ref 13.0–17.0)
Lymphocytes Relative: 50 %
Lymphs Abs: 2 K/uL (ref 0.7–4.0)
MCH: 29.5 pg (ref 26.0–34.0)
MCHC: 33.2 g/dL (ref 30.0–36.0)
MCV: 88.7 fL (ref 78.0–100.0)
Metamyelocytes Relative: 0 %
Monocytes Absolute: 0.4 K/uL (ref 0.1–1.0)
Monocytes Relative: 10 %
Myelocytes: 0 %
Neutro Abs: 1.5 K/uL — ABNORMAL LOW (ref 1.7–7.7)
Neutrophils Relative %: 33 %
Other: 0 %
Platelets: 60 K/uL — ABNORMAL LOW (ref 150–400)
Promyelocytes Absolute: 0 %
RBC: 2.75 MIL/uL — ABNORMAL LOW (ref 4.22–5.81)
RDW: 15.8 % — ABNORMAL HIGH (ref 11.5–15.5)
WBC: 4.1 K/uL (ref 4.0–10.5)
nRBC: 0 /100{WBCs}

## 2015-05-04 LAB — BASIC METABOLIC PANEL WITH GFR
Anion gap: 12 (ref 5–15)
BUN: 23 mg/dL — ABNORMAL HIGH (ref 6–20)
CO2: 19 mmol/L — ABNORMAL LOW (ref 22–32)
Calcium: 8.2 mg/dL — ABNORMAL LOW (ref 8.9–10.3)
Chloride: 105 mmol/L (ref 101–111)
Creatinine, Ser: 1.83 mg/dL — ABNORMAL HIGH (ref 0.61–1.24)
GFR calc Af Amer: 41 mL/min — ABNORMAL LOW
GFR calc non Af Amer: 35 mL/min — ABNORMAL LOW
Glucose, Bld: 107 mg/dL — ABNORMAL HIGH (ref 65–99)
Potassium: 2.8 mmol/L — ABNORMAL LOW (ref 3.5–5.1)
Sodium: 136 mmol/L (ref 135–145)

## 2015-05-04 LAB — URINE MICROSCOPIC-ADD ON

## 2015-05-04 LAB — URINALYSIS, ROUTINE W REFLEX MICROSCOPIC
Glucose, UA: NEGATIVE mg/dL
Ketones, ur: NEGATIVE mg/dL
Leukocytes, UA: NEGATIVE
Nitrite: NEGATIVE
Protein, ur: 100 mg/dL — AB
Specific Gravity, Urine: 1.023 (ref 1.005–1.030)
Urobilinogen, UA: 1 mg/dL (ref 0.0–1.0)
pH: 5.5 (ref 5.0–8.0)

## 2015-05-04 LAB — D-DIMER, QUANTITATIVE: D-Dimer, Quant: 3.25 ug{FEU}/mL — ABNORMAL HIGH (ref 0.00–0.48)

## 2015-05-04 LAB — I-STAT CG4 LACTIC ACID, ED
Lactic Acid, Venous: 1.48 mmol/L (ref 0.5–2.0)
Lactic Acid, Venous: 0.96 mmol/L (ref 0.5–2.0)

## 2015-05-04 LAB — I-STAT TROPONIN, ED: Troponin i, poc: 0 ng/mL (ref 0.00–0.08)

## 2015-05-04 LAB — POC OCCULT BLOOD, ED: Fecal Occult Bld: NEGATIVE

## 2015-05-04 MED ORDER — SODIUM CHLORIDE 0.9 % IV BOLUS (SEPSIS)
500.0000 mL | Freq: Once | INTRAVENOUS | Status: AC
  Administered 2015-05-04: 500 mL via INTRAVENOUS

## 2015-05-04 MED ORDER — IOHEXOL 350 MG/ML SOLN
100.0000 mL | Freq: Once | INTRAVENOUS | Status: AC | PRN
  Administered 2015-05-04: 80 mL via INTRAVENOUS

## 2015-05-04 MED ORDER — SODIUM CHLORIDE 0.9 % IV SOLN
Freq: Once | INTRAVENOUS | Status: AC
  Administered 2015-05-05: 1000 mL via INTRAVENOUS

## 2015-05-04 MED ORDER — POTASSIUM CHLORIDE CRYS ER 20 MEQ PO TBCR
40.0000 meq | EXTENDED_RELEASE_TABLET | Freq: Once | ORAL | Status: AC
  Administered 2015-05-04: 40 meq via ORAL
  Filled 2015-05-04: qty 2

## 2015-05-04 NOTE — ED Provider Notes (Signed)
CSN: [id number]     Arrival date & time 05/04/15  1837 History   First MD Initiated Contact with Patient 05/04/15 1902     Chief Complaint  Patient presents with  . Fall  . 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 Patient states his weakness has been getting progressively worse. The patient tripped over his dog on Wednesday and landed on his left knee and right wrist. Denies head injury. Now unable to ambulate and bear weight on left leg. Patient has been complaining of diarrhea nearly every day since the beginning of his new chemotherapy agent I weeks ago. This has been discussed with his oncologist who reduced the dose to 15 mg per day. Patient has had very poor by mouth intake over the last 5 weeks and has been feeling increasingly short of breath since yesterday. Denies known fevers, chills, vomiting, chest pain, numbness, tingling.   The history is provided by the patient and a relative.    Past Medical History  Diagnosis Date  . Thyroid disease   . Hyperlipemia   . Liver disease   . Malnutrition ([place]) 08/30/2013  . Hypothyroidism   . Depression     Spouse passed 5 months ago  . Gout 09/28/2013  . Fatigue 08/31/2014  . Essential hypertension 10/25/2014  . 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 Back surgery      1985 and 1986  . Axillary lymph node biopsy Right 09/12/2013    Procedure: RIGHT AXILLARY LYMPH NODE BIOPSY;  Surgeon: Marcello Moores A. Cornett, MD;  Location: [facility];  Service: General;  Laterality: Right;  . Portacath placement Right 09/12/2013     Procedure: INSERTION PORT-A-CATH;  Surgeon: Joyice Faster. Cornett, MD;  Location: [facility];  Service: General;  Laterality: Right;   Family History  Problem Relation Age of Onset  . Cancer Mother     breast ca  . Cancer Sister     NHL  . Cancer Brother     ?colon can  . Cancer Brother     lung ca   Social History  Substance Use Topics  . Smoking status: Former Smoker -- 0.25 packs/day for 52 years    Types: Cigarettes  . Smokeless tobacco: Never Used  . Alcohol Use: No    Review of Systems  All other systems reviewed and are negative.     Allergies  Review of patient's allergies indicates no known allergies.  Home Medications   Prior to Admission medications   Medication Sig Start Date End Date Taking? Authorizing Provider  acetaminophen (TYLENOL) 500 MG tablet Take 500 mg by mouth every 6 (six) hours as needed for moderate pain or fever.    Yes Historical Provider, MD  acyclovir (ZOVIRAX) 400 MG tablet Take 1 tablet (400 mg total) by mouth daily. 04/17/15  Yes Heath Lark, MD  levothyroxine (SYNTHROID, LEVOTHROID) 75 MCG tablet Take 1 tablet (75 mcg total) by mouth daily. 04/17/15  Yes Heath Lark, MD  loratadine (CLARITIN) 10 MG tablet Take 10 mg by mouth every morning.   Yes Historical Provider, MD  ondansetron (ZOFRAN) 8 MG tablet Take 1 tablet (8  mg total) by mouth 3 (three) times daily. 03/08/15  Yes Heath Lark, MD  oxyCODONE (ROXICODONE) 5 MG immediate release tablet Take 1 tablet (5 mg total) by mouth every 4 (four) hours as needed for severe pain. 03/04/15  Yes Heath Lark, MD  prochlorperazine (COMPAZINE) 10 MG tablet Take 10 mg by mouth every 6 (six) hours as needed for nausea or vomiting.  03/08/15  Yes Historical Provider, MD  lenalidomide (REVLIMID) 15 MG capsule Take 1 capsule (15 mg total) by mouth daily. Patient not taking: Reported on 05/04/2015 04/19/15   Ni Gorsuch, MD   BP 122/61 mmHg  Pulse 87  Temp(Src) 101.5 F (38.6 C) (Rectal)  Resp 28  SpO2  94% Physical Exam  Constitutional: He is oriented to person, place, and time. He appears well-developed. No distress.  Ill appearing male laying in bed.  HENT:  Head: Normocephalic and atraumatic.  Mouth/Throat: No oropharyngeal exudate.  Mucous membranes dry.  Eyes: Conjunctivae and EOM are normal. Pupils are equal, round, and reactive to light. Right eye exhibits no discharge. Left eye exhibits no discharge. No scleral icterus.  Neck: Normal range of motion. Neck supple.  Cardiovascular: Normal rate, regular rhythm, normal heart sounds and intact distal pulses.  Exam reveals no gallop and no friction rub.   No murmur heard. Pulmonary/Chest: Effort normal and breath sounds normal. No respiratory distress. He has no wheezes. He has no rales. He exhibits no tenderness.  Tachypneic 31 RR  Abdominal: Soft. Bowel sounds are normal. He exhibits no distension and no mass. There is no tenderness. There is no rebound and no guarding.  Musculoskeletal: Normal range of motion. He exhibits no edema.  Negative anterior/poster drawer bilaterally. Negative ballottement test. No varus or valgus laxity. No crepitus. Pain felt with flexion of left knee. TTP over posterior left knee.  Mild edema of R wrist. Pain felt with flexion of R wrist. Decrease ROm, limited by pain. No evidence of tendon injury. Good radial pulse.No obvious bony deformity.  Lymphadenopathy:    He has no cervical adenopathy.  Neurological: He is alert and oriented to person, place, and time. No cranial nerve deficit. Coordination normal.  Weakness throughout, strength 3/5. No sensory deficits. Unable to ambulate.  Skin: Skin is warm and dry. No rash noted. He is not diaphoretic. No erythema. No pallor.  Psychiatric: He has a normal mood and affect. His behavior is normal.  Nursing note and vitals reviewed.   ED Course  Procedures (including critical care time) Labs Review Labs Reviewed  BASIC METABOLIC PANEL - Abnormal; Notable  for the following:    Potassium 2.8 (*)    CO2 19 (*)    Glucose, Bld 107 (*)    BUN 23 (*)    Creatinine, Ser 1.83 (*)    Calcium 8.2 (*)    GFR calc non Af Amer 35 (*)    GFR calc Af Amer 41 (*)    All other components within normal limits  CBC WITH DIFFERENTIAL/PLATELET - Abnormal; Notable for the following:    RBC 2.75 (*)    Hemoglobin 8.1 (*)    HCT 24.4 (*)    RDW 15.8 (*)    Platelets 60 (*)    Neutro Abs 1.5 (*)    All other components within normal limits  URINALYSIS, ROUTINE W REFLEX MICROSCOPIC (NOT AT [facility]) - Abnormal; Notable for the following:    Color, Urine AMBER (*)    APPearance CLOUDY (*)    Hgb urine dipstick TRACE (*)  Bilirubin Urine SMALL (*)    Protein, ur 100 (*)    All other components within normal limits  URINE MICROSCOPIC-ADD ON - Abnormal; Notable for the following:    Casts HYALINE CASTS (*)    All other components within normal limits  D-DIMER, QUANTITATIVE (NOT AT [facility]) - Abnormal; Notable for the following:    D-Dimer, Quant 3.25 (*)    All other components within normal limits  PROTIME-INR - Abnormal; Notable for the following:    Prothrombin Time 17.2 (*)    All other components within normal limits  APTT - Abnormal; Notable for the following:    aPTT 38 (*)    All other components within normal limits  BODY FLUID CULTURE  GRAM STAIN  FIBRINOGEN  PATHOLOGIST SMEAR REVIEW  SYNOVIAL CELL COUNT + DIFF, W/ CRYSTALS  I-STAT CG4 LACTIC ACID, ED  I-STAT TROPOININ, ED  POC OCCULT BLOOD, ED  I-STAT CG4 LACTIC ACID, ED  TYPE AND SCREEN  PREPARE RBC (CROSSMATCH)  ABO/RH    Imaging Review Dg Chest 2 View  05/04/2015   CLINICAL DATA:  Generalize weakness and a fall after starting chemotherapy 5 weeks ago. Fall on Thursday.  EXAM: CHEST  2 VIEW  COMPARISON:  02/10/2015.  FINDINGS: Both lateral views are degraded by patient arm position. Right sided Port-A-Cath terminates at the mid SVC. Midline trachea. Normal heart size. Atherosclerosis  in the transverse aorta. No pleural effusion or pneumothorax. No congestive failure. Clear lungs.  IMPRESSION: No acute or posttraumatic deformity.  Aortic  atherosclerosis.   Electronically Signed   By: Abigail Miyamoto M.D.   On: 05/04/2015 21:25   Dg Wrist Complete Right  05/04/2015   CLINICAL DATA:  Acute onset of generalized weakness. Status post fall, with right wrist pain. Initial encounter.  EXAM: RIGHT WRIST - COMPLETE 3+ VIEW  COMPARISON:  None.  FINDINGS: There is no evidence of fracture or dislocation. The carpal rows are intact, and demonstrate normal alignment. The joint spaces are preserved. Negative ulnar variance is noted.  No significant soft tissue abnormalities are seen.  IMPRESSION: No evidence of fracture or dislocation.   Electronically Signed   By: Garald Balding M.D.   On: 05/04/2015 21:13   Ct Angio Chest Pe W/cm &/or Wo Cm  05/04/2015   CLINICAL DATA:  Positive D-dimer. Generalized weakness. Chemotherapy for lymphoma  EXAM: CT ANGIOGRAPHY CHEST WITH CONTRAST  TECHNIQUE: Multidetector CT imaging of the chest was performed using the standard protocol during bolus administration of intravenous contrast. Multiplanar CT image reconstructions and MIPs were obtained to evaluate the vascular anatomy.  CONTRAST:  21mL OMNIPAQUE IOHEXOL 350 MG/ML SOLN  COMPARISON:  PET-CT 03/22/2015  FINDINGS: THORACIC INLET/BODY WALL:  Right IJ porta catheter remains in good position.  MEDIASTINUM:  Normal heart size. No pericardial effusion. No acute vascular abnormality, including evidence of pulmonary embolism or aortic dissection. Atherosclerosis, including the coronary arteries.  Since PET-CT, diffuse increase in mediastinal nodal size, including prevascular, upper right peritracheal, and subcarinal nodes. Subcarinal node is now 11 mm in short axis, previously 3 mm.  LUNG WINDOWS:  No consolidation.  No effusion.  UPPER ABDOMEN:  Visible portions of the spleen are clearly larger than on PET-CT. Additionally,  on the second lower acquisition, there is new retroperitoneal adenopathy with periportal node measuring up to 3 cm short axis.  OSSEOUS:  No acute fracture. No suspicious lytic or blastic lesions. Stable endplate erosive changes in the lower thoracic spine.  Review of the MIP images  confirms the above findings.  IMPRESSION: 1. Negative for pulmonary embolism or other acute finding. 2. Lymphoma with enlarging mediastinal and upper abdominal lymph nodes since PET-CT 03/22/2015. Please arrange follow-up with patient's oncologist.   Electronically Signed   By: Monte Fantasia M.D.   On: 05/04/2015 23:04   Dg Knee Complete 4 Views Left  05/04/2015   CLINICAL DATA:  Acute onset of generalized weakness. Status post fall, with left knee pain. Initial encounter.  EXAM: LEFT KNEE - COMPLETE 4+ VIEW  COMPARISON:  None.  FINDINGS: There is no evidence of fracture or dislocation. The joint spaces are preserved. No significant degenerative change is seen; the patellofemoral joint is grossly unremarkable in appearance.  A moderate knee joint effusion is noted. Chondrocalcinosis is noted. An apparent soft tissue calcification is noted medially and posteriorly at the level of the distal femur.  IMPRESSION: 1. No evidence of fracture or dislocation. 2. Moderate knee joint effusion noted. 3. Chondrocalcinosis noted.   Electronically Signed   By: Garald Balding M.D.   On: 05/04/2015 21:15   I have personally reviewed and evaluated these images and lab results as part of my medical decision-making.   EKG Interpretation   Date/Time:  Saturday May 04 2015 19:54:05 EDT Ventricular Rate:  90 PR Interval:  151 QRS Duration: 111 QT Interval:  443 QTC Calculation: 542 R Axis:   -31 Text Interpretation:  Sinus rhythm Left axis deviation Borderline low  voltage, extremity leads Prolonged QT interval since last tracing no  significant change other than QT prolongation Confirmed by BELFI  MD,  MELANIE ([id number]) on 05/04/2015  10:33:28 PM      MDM   Final diagnoses:  Mantle cell lymphoma, unspecified body region ([place])  Weakness  Anemia, unspecified anemia type  Thrombocytopenia ([place])   Patient with history of mantle cell lymphoma currently being treated with`Revlimid presents the emergency department with increasing weakness and fall.   K 2.8, will replete with 56meq PO.  Fever of unknown source. Rectal temp 101.5. UA neg. CXR neg. WBC wnl. R/o neutropenic fever. No evidence of cellulitis on clinical exam. Lactic wnl. EKG with prolonged QT.   9:44 PM Spoke with Dr. Whitney Muse from oncology to discuss pt. Was Informed that Revlimid has an incredibly high risk for causing thromboembolic events. Pt not currently on blood thinners. Will D-dimer. She states that Revlimid can cause pancytopenia and attributes his low blood counts to this drug. Recommends discontinue medication until can see Dr. Alvy Bimler. Low threshold to transfuse this pt especially with drop in hemoglobin to 8.1. Hemoccult neg.   Xray of R wrist and L knee negative for fracture. However, pt unable to weight bear on left leg.   D-dimer 3.25. CT Angio PE study ordered. Negative for PE however, lymphoma with enlarging mediastinal and upper abdominal lymph nodes are seen since his last PET.   Will transfuse pt with 1 unit RBC as he is very weak and likely due to his low blood count.   11:54 PM Spoke with hospitalist who will consult pt in ED and admit to their service. Recommends aspiration of left knee: to determine septic joint vs gout.   Dr. Tamera Punt performed aspiration of knee joint. Fluid sent for culture and further eval.   Patient was discussed with and seen by Dr. Tamera Punt who agrees with the treatment plan.    Dondra Spry [place], PA-C 05/05/15 1914  Malvin Johns, MD 05/09/15 [phone]

## 2015-05-04 NOTE — ED Notes (Signed)
Patient complains of generalized weakness and a fall following starting a new chemotherapy regimen 5 weeks ago.  He states that he has diarrhea since starting a new chemotherapy drug.  He states he fell Thursday.

## 2015-05-04 NOTE — H&P (Addendum)
PCP: [facility] in [facility]   Referring provider [place] PA   Chief Complaint:  fatigue  HPI: Manuel [name] is a 74 y.o. 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   Presented with  Patient with known hx of mantel cell lymphoma status post [place]. In August he was found to be in complete remission and was started on rituximab every 60 days and we will addition to Revlimid. Patient lives at [facility] but comes back to see Dr. Alvy Bimler for treatment 5 days ago patient tripped and fell. He had injured his right wrist and left knee. Deneis LOC, or head injury. Since that fall he have not been feeling well. Patietn have been treated with Revlemid fot his lymphoma this was discontinued this week because Patient have had significant diarrhea but refuses to take Imodium. The plan was to restart Revlimid if diarrhea stops. As secondary tentative starting Revlimid was followed by another bout of diarrhea. Patient started to have no appetite since he did not want to eat and drink anything. Patient some point refused to come back to [facility] for father evaluation but eventually his daughter was able to bring him in at this point he was so debilitated she brought him straight to emerge department.  Patient reports poor PO intake, occasional nausea, severe diarrhea. He started to have fever today. HIs daughter brought him to ER.  HE was noted to be febrile up to 101.5. He denies any cough, no buring with urination.  Denis any chest pain reports some shortness of breath.  He was noted to have elevated d-dimer. CT angiogram was ordered by ER showed no evidence of PE evidence of lymphoma of the larger mediastinal and upper abdominal lymph nodes since  August.  Patient's left knee was imaged showing no evidence of fracture but moderate knee joint effusion. In ER left knee appear to be somewhat warm. ER physician  Performed Knee tap.  ER provider has discussed case with oncology Dr. Whitney Muse  Hospitalist was called for admission for fever, dehydration.   Review of Systems:    Pertinent positives include: Fevers, chills, fatigue, nausea, vomiting, diarrhea,   Constitutional:  No weight loss, night sweats, weight loss  HEENT:  No headaches, Difficulty swallowing,Tooth/dental problems,Sore throat,  No sneezing, itching, ear ache, nasal congestion, post nasal drip,  Cardio-vascular:  No chest pain, Orthopnea, PND, anasarca, dizziness, palpitations.no Bilateral lower extremity swelling  GI:  No heartburn, indigestion, abdominal pain, change in bowel habits, loss of appetite, melena, blood in stool, hematemesis Resp:  no shortness of breath at rest. No dyspnea on exertion, No excess mucus, no productive cough, No non-productive cough, No coughing up of blood.No change in color of mucus.No wheezing. Skin:  no rash or lesions. No jaundice GU:  no dysuria, change in color of urine, no urgency or frequency. No straining to urinate.  No flank pain.  Musculoskeletal:  No joint pain or no joint swelling. No decreased range of motion. No back pain.  Psych:  No change in mood or affect. No depression or anxiety. No memory loss.  Neuro: no localizing neurological complaints, no tingling, no weakness, no double vision, no gait abnormality, no slurred speech, no confusion  Otherwise ROS are negative except for above, 10 systems were reviewed  Past Medical History: Past Medical History  Diagnosis Date  .  Thyroid disease   . Hyperlipemia   . Liver disease   . Malnutrition ([place]) 08/30/2013  . Hypothyroidism   . Depression     Spouse passed 5 months ago  . Gout 09/28/2013  . Fatigue 08/31/2014  . Essential hypertension 10/25/2014  . Other  malignant lymphomas of lymph nodes of multiple sites 08/30/2013   Past Surgical History  Procedure Laterality Date  . Back surgery      1985 and 1986  . Axillary lymph node biopsy Right 09/12/2013    Procedure: RIGHT AXILLARY LYMPH NODE BIOPSY;  Surgeon: Marcello Moores A. Cornett, MD;  Location: [place];  Service: General;  Laterality: Right;  . Portacath placement Right 09/12/2013    Procedure: INSERTION PORT-A-CATH;  Surgeon: Joyice Faster. Cornett, MD;  Location: [place];  Service: General;  Laterality: Right;     Medications: Prior to Admission medications   Medication Sig Start Date End Date Taking? Authorizing Provider  acetaminophen (TYLENOL) 500 MG tablet Take 500 mg by mouth every 6 (six) hours as needed for moderate pain or fever.    Yes Historical Provider, MD  acyclovir (ZOVIRAX) 400 MG tablet Take 1 tablet (400 mg total) by mouth daily. 04/17/15  Yes Heath Lark, MD  levothyroxine (SYNTHROID, LEVOTHROID) 75 MCG tablet Take 1 tablet (75 mcg total) by mouth daily. 04/17/15  Yes Heath Lark, MD  loratadine (CLARITIN) 10 MG tablet Take 10 mg by mouth every morning.   Yes Historical Provider, MD  ondansetron (ZOFRAN) 8 MG tablet Take 1 tablet (8 mg total) by mouth 3 (three) times daily. 03/08/15  Yes Heath Lark, MD  oxyCODONE (ROXICODONE) 5 MG immediate release tablet Take 1 tablet (5 mg total) by mouth every 4 (four) hours as needed for severe pain. 03/04/15  Yes Heath Lark, MD  prochlorperazine (COMPAZINE) 10 MG tablet Take 10 mg by mouth every 6 (six) hours as needed for nausea or vomiting.  03/08/15  Yes Historical Provider, MD  lenalidomide (REVLIMID) 15 MG capsule Take 1 capsule (15 mg total) by mouth daily. Patient not taking: Reported on 05/04/2015 04/19/15   Heath Lark, MD    Allergies:  No Known Allergies  Social History:  Ambulatory walker   Lives at home alone,        reports that he has quit smoking. His smoking use included Cigarettes. He has a 13  pack-year smoking history. He has never used smokeless tobacco. He reports that he does not drink alcohol or use illicit drugs.    Family History: family history includes Cancer in his brother, brother, mother, and sister.    Physical Exam: Patient Vitals for the past 24 hrs:  BP Temp Temp src Pulse Resp SpO2  05/04/15 2230 113/57 mmHg - - 85 (!) 29 94 %  05/04/15 2207 110/60 mmHg - - - - -  05/04/15 2200 116/55 mmHg - - 85 (!) 31 95 %  05/04/15 2100 122/61 mmHg - - 87 (!) 28 94 %  05/04/15 2059 - 101.5 F (38.6 C) Rectal - - -  05/04/15 2048 120/62 mmHg - - - - -  05/04/15 1853 126/56 mmHg 98.3 F (36.8 C) Oral 95 16 97 %    1. General:  in No Acute distress 2. Psychological: Alert and Oriented 3. Head/ENT:    Dry Mucous Membranes                          Head Non traumatic, neck  supple                           Poor Dentition 4. SKIN: decreased Skin turgor,  Skin clean Dry and intact no rash 5. Heart: Regular rate and rhythm no Murmur, Rub or gallop 6. Lungs:  no wheezesoccasional crackles   7. Abdomen: Soft, non-tender, Non distended 8. Lower extremities: no clubbing, cyanosis, or edema 9. Neurologically Grossly intact, moving all 4 extremities equally 10. MSK: Normal range of motion, left knee swallen and hot to the touch.   body mass index is unknown because there is no weight on file.   Labs on Admission:   Results for orders placed or performed during the hospital encounter of 05/04/15 (from the past 24 hour(s))  Basic metabolic panel     Status: Abnormal   Collection Time: 05/04/15  7:45 PM  Result Value Ref Range   Sodium 136 135 - 145 mmol/L   Potassium 2.8 (L) 3.5 - 5.1 mmol/L   Chloride 105 101 - 111 mmol/L   CO2 19 (L) 22 - 32 mmol/L   Glucose, Bld 107 (H) 65 - 99 mg/dL   BUN 23 (H) 6 - 20 mg/dL   Creatinine, Ser 1.83 (H) 0.61 - 1.24 mg/dL   Calcium 8.2 (L) 8.9 - 10.3 mg/dL   GFR calc non Af Amer 35 (L) >60 mL/min   GFR calc Af Amer 41 (L) >60 mL/min     Anion gap 12 5 - 15  CBC with Differential     Status: Abnormal   Collection Time: 05/04/15  7:45 PM  Result Value Ref Range   WBC 4.1 4.0 - 10.5 K/uL   RBC 2.75 (L) 4.22 - 5.81 MIL/uL   Hemoglobin 8.1 (L) 13.0 - 17.0 g/dL   HCT 24.4 (L) 39.0 - 52.0 %   MCV 88.7 78.0 - 100.0 fL   MCH 29.5 26.0 - 34.0 pg   MCHC 33.2 30.0 - 36.0 g/dL   RDW 15.8 (H) 11.5 - 15.5 %   Platelets 60 (L) 150 - 400 K/uL   Neutrophils Relative % 33 %   Lymphocytes Relative 50 %   Monocytes Relative 10 %   Eosinophils Relative 4 %   Basophils Relative 0 %   Band Neutrophils 3 %   Metamyelocytes Relative 0 %   Myelocytes 0 %   Promyelocytes Absolute 0 %   Blasts 0 %   nRBC 0 0 /100 WBC   Other 0 %   Neutro Abs 1.5 (L) 1.7 - 7.7 K/uL   Lymphs Abs 2.0 0.7 - 4.0 K/uL   Monocytes Absolute 0.4 0.1 - 1.0 K/uL   Eosinophils Absolute 0.2 0.0 - 0.7 K/uL   Basophils Absolute 0.0 0.0 - 0.1 K/uL   RBC Morphology ELLIPTOCYTES    WBC Morphology ATYPICAL LYMPHOCYTES    Smear Review PLATELET COUNT CONFIRMED BY SMEAR   I-Stat CG4 Lactic Acid, ED     Status: None   Collection Time: 05/04/15  7:50 PM  Result Value Ref Range   Lactic Acid, Venous 1.48 0.5 - 2.0 mmol/L  Urinalysis, Routine w reflex microscopic     Status: Abnormal   Collection Time: 05/04/15  8:14 PM  Result Value Ref Range   Color, Urine AMBER (A) YELLOW   APPearance CLOUDY (A) CLEAR   Specific Gravity, Urine 1.023 1.005 - 1.030   pH 5.5 5.0 - 8.0   Glucose, UA NEGATIVE NEGATIVE mg/dL  Hgb urine dipstick TRACE (A) NEGATIVE   Bilirubin Urine SMALL (A) NEGATIVE   Ketones, ur NEGATIVE NEGATIVE mg/dL   Protein, ur 100 (A) NEGATIVE mg/dL   Urobilinogen, UA 1.0 0.0 - 1.0 mg/dL   Nitrite NEGATIVE NEGATIVE   Leukocytes, UA NEGATIVE NEGATIVE  Urine microscopic-add on     Status: Abnormal   Collection Time: 05/04/15  8:14 PM  Result Value Ref Range   Bacteria, UA RARE RARE   Casts HYALINE CASTS (A) NEGATIVE   Urine-Other AMORPHOUS URATES/PHOSPHATES    I-stat troponin, ED     Status: None   Collection Time: 05/04/15  8:28 PM  Result Value Ref Range   Troponin i, poc 0.00 0.00 - 0.08 ng/mL   Comment 3          POC occult blood, ED Provider will collect     Status: None   Collection Time: 05/04/15  9:25 PM  Result Value Ref Range   Fecal Occult Bld NEGATIVE NEGATIVE  D-dimer, quantitative (not at [facility])     Status: Abnormal   Collection Time: 05/04/15  9:38 PM  Result Value Ref Range   D-Dimer, Quant 3.25 (H) 0.00 - 0.48 ug/mL-FEU  I-Stat CG4 Lactic Acid, ED     Status: None   Collection Time: 05/04/15 11:13 PM  Result Value Ref Range   Lactic Acid, Venous 0.96 0.5 - 2.0 mmol/L    UA cloudy but no evidence of UTI  No results found for: HGBA1C  CrCl cannot be calculated (Unknown ideal weight.).  BNP (last 3 results) No results for input(s): PROBNP in the last 8760 hours.  Other results:  I have pearsonaly reviewed this: ECG REPORT  Rate: 90  Rhythm: SR ST&T Change:  t wave flattening QTC 540   There were no vitals filed for this visit.   Cultures:    Component Value Date/Time   SDES URINE, CLEAN CATCH 02/10/2015 2130   SPECREQUEST NONE 02/10/2015 2130   CULT  02/10/2015 2130    MULTIPLE SPECIES PRESENT, SUGGEST RECOLLECTION IF CLINICALLY INDICATED Performed at [facility] 02/12/2015 FINAL 02/10/2015 2130     Radiological Exams on Admission: Dg Chest 2 View  05/04/2015   CLINICAL DATA:  Generalize weakness and a fall after starting chemotherapy 5 weeks ago. Fall on Thursday.  EXAM: CHEST  2 VIEW  COMPARISON:  02/10/2015.  FINDINGS: Both lateral views are degraded by patient arm position. Right sided Port-A-Cath terminates at the mid SVC. Midline trachea. Normal heart size. Atherosclerosis in the transverse aorta. No pleural effusion or pneumothorax. No congestive failure. Clear lungs.  IMPRESSION: No acute or posttraumatic deformity.  Aortic  atherosclerosis.   Electronically Signed   By:  Abigail Miyamoto M.D.   On: 05/04/2015 21:25   Dg Wrist Complete Right  05/04/2015   CLINICAL DATA:  Acute onset of generalized weakness. Status post fall, with right wrist pain. Initial encounter.  EXAM: RIGHT WRIST - COMPLETE 3+ VIEW  COMPARISON:  None.  FINDINGS: There is no evidence of fracture or dislocation. The carpal rows are intact, and demonstrate normal alignment. The joint spaces are preserved. Negative ulnar variance is noted.  No significant soft tissue abnormalities are seen.  IMPRESSION: No evidence of fracture or dislocation.   Electronically Signed   By: Garald Balding M.D.   On: 05/04/2015 21:13   Ct Angio Chest Pe W/cm &/or Wo Cm  05/04/2015   CLINICAL DATA:  Positive D-dimer. Generalized weakness. Chemotherapy for lymphoma  EXAM: CT ANGIOGRAPHY CHEST WITH CONTRAST  TECHNIQUE: Multidetector CT imaging of the chest was performed using the standard protocol during bolus administration of intravenous contrast. Multiplanar CT image reconstructions and MIPs were obtained to evaluate the vascular anatomy.  CONTRAST:  15mL OMNIPAQUE IOHEXOL 350 MG/ML SOLN  COMPARISON:  PET-CT 03/22/2015  FINDINGS: THORACIC INLET/BODY WALL:  Right IJ porta catheter remains in good position.  MEDIASTINUM:  Normal heart size. No pericardial effusion. No acute vascular abnormality, including evidence of pulmonary embolism or aortic dissection. Atherosclerosis, including the coronary arteries.  Since PET-CT, diffuse increase in mediastinal nodal size, including prevascular, upper right peritracheal, and subcarinal nodes. Subcarinal node is now 11 mm in short axis, previously 3 mm.  LUNG WINDOWS:  No consolidation.  No effusion.  UPPER ABDOMEN:  Visible portions of the spleen are clearly larger than on PET-CT. Additionally, on the second lower acquisition, there is new retroperitoneal adenopathy with periportal node measuring up to 3 cm short axis.  OSSEOUS:  No acute fracture. No suspicious lytic or blastic lesions. Stable  endplate erosive changes in the lower thoracic spine.  Review of the MIP images confirms the above findings.  IMPRESSION: 1. Negative for pulmonary embolism or other acute finding. 2. Lymphoma with enlarging mediastinal and upper abdominal lymph nodes since PET-CT 03/22/2015. Please arrange follow-up with patient's oncologist.   Electronically Signed   By: Monte Fantasia M.D.   On: 05/04/2015 23:04   Dg Knee Complete 4 Views Left  05/04/2015   CLINICAL DATA:  Acute onset of generalized weakness. Status post fall, with left knee pain. Initial encounter.  EXAM: LEFT KNEE - COMPLETE 4+ VIEW  COMPARISON:  None.  FINDINGS: There is no evidence of fracture or dislocation. The joint spaces are preserved. No significant degenerative change is seen; the patellofemoral joint is grossly unremarkable in appearance.  A moderate knee joint effusion is noted. Chondrocalcinosis is noted. An apparent soft tissue calcification is noted medially and posteriorly at the level of the distal femur.  IMPRESSION: 1. No evidence of fracture or dislocation. 2. Moderate knee joint effusion noted. 3. Chondrocalcinosis noted.   Electronically Signed   By: Garald Balding M.D.   On: 05/04/2015 21:15    Chart has been reviewed  Family not  at  Bedside    Assessment/Plan 74 year old gentleman with history of mantle cell lymphoma which was thought to be in remission presents with diarrhea and diffuse weakness which was thought to be secondary to Revlimid was found to have forcing anemia, thrombocytopenia, hypokalemia acute onset or renal failure likely secondary to dehydration and CT scan which was worrisome for recurrence of mantle cell lymphoma. ER M.D. discuss case with oncology who recommended admission to medicine and they will follow.   Present on Admission:  . Acute renal failure (ARF) (HCC) - - likely secondary to dehydration, check FeNA and if not improved with IVF would obtain renal US and consider renal consult.   .  Essential hypertension- hold by mouth medications  . Fever/ SIRS - blood cultures obtained. Knee tap obtained to rule out septic joint in the setting of fever and significant joint pain. We'll evaluate for influenza, given immunosuppression will, broadly until blood cultures are back  . Mantle cell lymphoma ([place]) - continue to hold Revlimid given significant side effects. Oncology to consult appreciate the input  . Thrombocytopenia ([place]) likely secondary to chemotherapy. Continue to follow currently no acute bleeding  . Hypokalemia - will replace check magnesium level, recheck potassium in the morning  .  Dehydration- will administer IV fluids and rehydrate   knee swelling - serosanguineous joint fluid was obtained, patient with history of trauma to the knee with thrombocytopenia Likely explains knee infusion. Given fever for now can cover with vancomycin and await results of fluid culture as well as blood cultures Prolonged QTC we'll admit to telemetry replace electrolytes and monitor avoid QT prolonging medications  Prophylaxis: SCD    CODE STATUS:  FULL CODE   as per patient    Disposition: Patient may need placement for rehabilitation but at this point not interested.                          Other plan as per orders.  I have spent a total of 55 min on this admission  Yarel Kilcrease 05/04/2015, 11:57 PM  [facility]  Pager [phone]   after 2 AM please page floor coverage PA If 7AM-7PM, please contact the day team taking care of the patient  [email or web address]  Password [email or web address]

## 2015-05-05 ENCOUNTER — Encounter (HOSPITAL_COMMUNITY): Payer: Self-pay | Admitting: *Deleted

## 2015-05-05 ENCOUNTER — Inpatient Hospital Stay (HOSPITAL_COMMUNITY): Payer: Medicare PPO

## 2015-05-05 DIAGNOSIS — I10 Essential (primary) hypertension: Secondary | ICD-10-CM | POA: Diagnosis present

## 2015-05-05 DIAGNOSIS — Z801 Family history of malignant neoplasm of trachea, bronchus and lung: Secondary | ICD-10-CM | POA: Diagnosis not present

## 2015-05-05 DIAGNOSIS — E876 Hypokalemia: Secondary | ICD-10-CM | POA: Diagnosis present

## 2015-05-05 DIAGNOSIS — E861 Hypovolemia: Secondary | ICD-10-CM | POA: Diagnosis present

## 2015-05-05 DIAGNOSIS — I4581 Long QT syndrome: Secondary | ICD-10-CM | POA: Diagnosis present

## 2015-05-05 DIAGNOSIS — C8310 Mantle cell lymphoma, unspecified site: Secondary | ICD-10-CM | POA: Diagnosis present

## 2015-05-05 DIAGNOSIS — D6959 Other secondary thrombocytopenia: Secondary | ICD-10-CM | POA: Diagnosis present

## 2015-05-05 DIAGNOSIS — N179 Acute kidney failure, unspecified: Principal | ICD-10-CM

## 2015-05-05 DIAGNOSIS — R5081 Fever presenting with conditions classified elsewhere: Secondary | ICD-10-CM

## 2015-05-05 DIAGNOSIS — G4700 Insomnia, unspecified: Secondary | ICD-10-CM | POA: Diagnosis present

## 2015-05-05 DIAGNOSIS — M25462 Effusion, left knee: Secondary | ICD-10-CM

## 2015-05-05 DIAGNOSIS — R630 Anorexia: Secondary | ICD-10-CM

## 2015-05-05 DIAGNOSIS — R9431 Abnormal electrocardiogram [ECG] [EKG]: Secondary | ICD-10-CM | POA: Diagnosis present

## 2015-05-05 DIAGNOSIS — D630 Anemia in neoplastic disease: Secondary | ICD-10-CM | POA: Diagnosis not present

## 2015-05-05 DIAGNOSIS — M109 Gout, unspecified: Secondary | ICD-10-CM | POA: Diagnosis present

## 2015-05-05 DIAGNOSIS — D649 Anemia, unspecified: Secondary | ICD-10-CM | POA: Diagnosis not present

## 2015-05-05 DIAGNOSIS — Z807 Family history of other malignant neoplasms of lymphoid, hematopoietic and related tissues: Secondary | ICD-10-CM | POA: Diagnosis not present

## 2015-05-05 DIAGNOSIS — E46 Unspecified protein-calorie malnutrition: Secondary | ICD-10-CM | POA: Diagnosis present

## 2015-05-05 DIAGNOSIS — Z87891 Personal history of nicotine dependence: Secondary | ICD-10-CM | POA: Diagnosis not present

## 2015-05-05 DIAGNOSIS — D61818 Other pancytopenia: Secondary | ICD-10-CM

## 2015-05-05 DIAGNOSIS — R6510 Systemic inflammatory response syndrome (SIRS) of non-infectious origin without acute organ dysfunction: Secondary | ICD-10-CM | POA: Diagnosis present

## 2015-05-05 DIAGNOSIS — R531 Weakness: Secondary | ICD-10-CM | POA: Diagnosis present

## 2015-05-05 DIAGNOSIS — E785 Hyperlipidemia, unspecified: Secondary | ICD-10-CM | POA: Diagnosis present

## 2015-05-05 DIAGNOSIS — Z803 Family history of malignant neoplasm of breast: Secondary | ICD-10-CM | POA: Diagnosis not present

## 2015-05-05 DIAGNOSIS — Z66 Do not resuscitate: Secondary | ICD-10-CM | POA: Diagnosis not present

## 2015-05-05 DIAGNOSIS — E860 Dehydration: Secondary | ICD-10-CM | POA: Diagnosis present

## 2015-05-05 DIAGNOSIS — D709 Neutropenia, unspecified: Secondary | ICD-10-CM

## 2015-05-05 DIAGNOSIS — R791 Abnormal coagulation profile: Secondary | ICD-10-CM | POA: Diagnosis not present

## 2015-05-05 DIAGNOSIS — N170 Acute kidney failure with tubular necrosis: Secondary | ICD-10-CM | POA: Diagnosis not present

## 2015-05-05 DIAGNOSIS — W010XXA Fall on same level from slipping, tripping and stumbling without subsequent striking against object, initial encounter: Secondary | ICD-10-CM | POA: Diagnosis present

## 2015-05-05 DIAGNOSIS — K769 Liver disease, unspecified: Secondary | ICD-10-CM | POA: Diagnosis present

## 2015-05-05 DIAGNOSIS — E039 Hypothyroidism, unspecified: Secondary | ICD-10-CM | POA: Diagnosis present

## 2015-05-05 DIAGNOSIS — Z79899 Other long term (current) drug therapy: Secondary | ICD-10-CM | POA: Diagnosis not present

## 2015-05-05 DIAGNOSIS — Z79891 Long term (current) use of opiate analgesic: Secondary | ICD-10-CM | POA: Diagnosis not present

## 2015-05-05 DIAGNOSIS — R197 Diarrhea, unspecified: Secondary | ICD-10-CM | POA: Diagnosis not present

## 2015-05-05 DIAGNOSIS — C8313 Mantle cell lymphoma, intra-abdominal lymph nodes: Secondary | ICD-10-CM | POA: Diagnosis not present

## 2015-05-05 DIAGNOSIS — T451X5A Adverse effect of antineoplastic and immunosuppressive drugs, initial encounter: Secondary | ICD-10-CM | POA: Diagnosis present

## 2015-05-05 DIAGNOSIS — A047 Enterocolitis due to Clostridium difficile: Secondary | ICD-10-CM | POA: Diagnosis present

## 2015-05-05 DIAGNOSIS — D696 Thrombocytopenia, unspecified: Secondary | ICD-10-CM | POA: Diagnosis not present

## 2015-05-05 DIAGNOSIS — R509 Fever, unspecified: Secondary | ICD-10-CM | POA: Diagnosis not present

## 2015-05-05 DIAGNOSIS — F329 Major depressive disorder, single episode, unspecified: Secondary | ICD-10-CM | POA: Diagnosis present

## 2015-05-05 DIAGNOSIS — R0602 Shortness of breath: Secondary | ICD-10-CM

## 2015-05-05 DIAGNOSIS — Z6825 Body mass index (BMI) 25.0-25.9, adult: Secondary | ICD-10-CM | POA: Diagnosis not present

## 2015-05-05 DIAGNOSIS — N178 Other acute kidney failure: Secondary | ICD-10-CM | POA: Diagnosis not present

## 2015-05-05 DIAGNOSIS — Z515 Encounter for palliative care: Secondary | ICD-10-CM | POA: Diagnosis not present

## 2015-05-05 DIAGNOSIS — E440 Moderate protein-calorie malnutrition: Secondary | ICD-10-CM | POA: Diagnosis present

## 2015-05-05 LAB — CBC WITH DIFFERENTIAL/PLATELET
BASOS ABS: 0.1 10*3/uL (ref 0.0–0.1)
BASOS PCT: 2 %
EOS ABS: 0.2 10*3/uL (ref 0.0–0.7)
Eosinophils Relative: 7 %
HCT: 24.5 % — ABNORMAL LOW (ref 39.0–52.0)
Hemoglobin: 8.2 g/dL — ABNORMAL LOW (ref 13.0–17.0)
Lymphocytes Relative: 45 %
Lymphs Abs: 1.5 10*3/uL (ref 0.7–4.0)
MCH: 30.3 pg (ref 26.0–34.0)
MCHC: 33.5 g/dL (ref 30.0–36.0)
MCV: 90.4 fL (ref 78.0–100.0)
MONO ABS: 1 10*3/uL (ref 0.1–1.0)
Monocytes Relative: 28 %
NEUTROS ABS: 0.6 10*3/uL — AB (ref 1.7–7.7)
Neutrophils Relative %: 18 %
PLATELETS: 55 10*3/uL — AB (ref 150–400)
RBC: 2.71 MIL/uL — ABNORMAL LOW (ref 4.22–5.81)
RDW: 15.7 % — AB (ref 11.5–15.5)
WBC: 3.4 10*3/uL — ABNORMAL LOW (ref 4.0–10.5)

## 2015-05-05 LAB — COMPREHENSIVE METABOLIC PANEL
ALK PHOS: 60 U/L (ref 38–126)
ALT: 25 U/L (ref 17–63)
AST: 25 U/L (ref 15–41)
Albumin: 2.6 g/dL — ABNORMAL LOW (ref 3.5–5.0)
Anion gap: 5 (ref 5–15)
BUN: 20 mg/dL (ref 6–20)
CALCIUM: 7.5 mg/dL — AB (ref 8.9–10.3)
CHLORIDE: 110 mmol/L (ref 101–111)
CO2: 20 mmol/L — AB (ref 22–32)
CREATININE: 1.65 mg/dL — AB (ref 0.61–1.24)
GFR calc non Af Amer: 40 mL/min — ABNORMAL LOW (ref >60)
GFR, EST AFRICAN AMERICAN: 46 mL/min — AB (ref >60)
GLUCOSE: 103 mg/dL — AB (ref 65–99)
Potassium: 3 mmol/L — ABNORMAL LOW (ref 3.5–5.1)
SODIUM: 135 mmol/L (ref 135–145)
Total Bilirubin: 1.4 mg/dL — ABNORMAL HIGH (ref 0.3–1.2)
Total Protein: 5 g/dL — ABNORMAL LOW (ref 6.5–8.1)

## 2015-05-05 LAB — C DIFFICILE QUICK SCREEN W PCR REFLEX
C Diff antigen: POSITIVE — AB
C Diff toxin: NEGATIVE

## 2015-05-05 LAB — SYNOVIAL CELL COUNT + DIFF, W/ CRYSTALS
CRYSTALS FLUID: NONE SEEN
Eosinophils-Synovial: 0 % (ref 0–1)
Lymphocytes-Synovial Fld: 2 % (ref 0–20)
MONOCYTE-MACROPHAGE-SYNOVIAL FLUID: 18 % — AB (ref 50–90)
Neutrophil, Synovial: 80 % — ABNORMAL HIGH (ref 0–25)
WBC, SYNOVIAL: 2444 /mm3 — AB (ref 0–200)

## 2015-05-05 LAB — PHOSPHORUS: Phosphorus: 3 mg/dL (ref 2.5–4.6)

## 2015-05-05 LAB — TSH: TSH: 2.723 u[IU]/mL (ref 0.350–4.500)

## 2015-05-05 LAB — INFLUENZA PANEL BY PCR (TYPE A & B)
H1N1FLUPCR: NOT DETECTED
INFLAPCR: NEGATIVE
INFLBPCR: NEGATIVE

## 2015-05-05 LAB — PROTIME-INR
INR: 1.39 (ref 0.00–1.49)
Prothrombin Time: 17.2 s — ABNORMAL HIGH (ref 11.6–15.2)

## 2015-05-05 LAB — MAGNESIUM: Magnesium: 1.4 mg/dL — ABNORMAL LOW (ref 1.7–2.4)

## 2015-05-05 LAB — PREPARE RBC (CROSSMATCH)

## 2015-05-05 LAB — APTT: aPTT: 38 s — ABNORMAL HIGH (ref 24–37)

## 2015-05-05 LAB — CREATININE, URINE, RANDOM: Creatinine, Urine: 151.41 mg/dL

## 2015-05-05 LAB — FIBRINOGEN: Fibrinogen: 448 mg/dL (ref 204–475)

## 2015-05-05 LAB — SODIUM, URINE, RANDOM: SODIUM UR: 24 mmol/L

## 2015-05-05 MED ORDER — ACETAMINOPHEN 325 MG PO TABS
650.0000 mg | ORAL_TABLET | Freq: Four times a day (QID) | ORAL | Status: DC | PRN
  Administered 2015-05-09: 650 mg via ORAL
  Filled 2015-05-05: qty 2

## 2015-05-05 MED ORDER — MORPHINE SULFATE (PF) 2 MG/ML IV SOLN: 2.0000 mg | INTRAVENOUS | Status: DC | PRN

## 2015-05-05 MED ORDER — SODIUM CHLORIDE 0.9 % IV SOLN
INTRAVENOUS | Status: DC
  Administered 2015-05-05 – 2015-05-07 (×3): via INTRAVENOUS
  Administered 2015-05-07 – 2015-05-08 (×2): 75 mL/h via INTRAVENOUS
  Administered 2015-05-08 – 2015-05-09 (×2): via INTRAVENOUS

## 2015-05-05 MED ORDER — SODIUM CHLORIDE 0.9 % IV SOLN
INTRAVENOUS | Status: AC
  Administered 2015-05-05: 02:00:00 via INTRAVENOUS

## 2015-05-05 MED ORDER — LEVOTHYROXINE SODIUM 25 MCG PO TABS
75.0000 ug | ORAL_TABLET | Freq: Every day | ORAL | Status: DC
  Administered 2015-05-05 – 2015-05-09 (×5): 75 ug via ORAL
  Filled 2015-05-05 (×5): qty 3

## 2015-05-05 MED ORDER — ENSURE ENLIVE PO LIQD
237.0000 mL | Freq: Two times a day (BID) | ORAL | Status: DC
  Administered 2015-05-05 – 2015-05-06 (×4): 237 mL via ORAL

## 2015-05-05 MED ORDER — METOCLOPRAMIDE HCL 5 MG/ML IJ SOLN
5.0000 mg | Freq: Four times a day (QID) | INTRAMUSCULAR | Status: DC
  Administered 2015-05-05 – 2015-05-08 (×16): 5 mg via INTRAVENOUS
  Filled 2015-05-05 (×16): qty 2

## 2015-05-05 MED ORDER — CETYLPYRIDINIUM CHLORIDE 0.05 % MT LIQD
7.0000 mL | Freq: Two times a day (BID) | OROMUCOSAL | Status: DC
  Administered 2015-05-05 – 2015-05-08 (×6): 7 mL via OROMUCOSAL

## 2015-05-05 MED ORDER — ACETAMINOPHEN 650 MG RE SUPP: 650.0000 mg | Freq: Four times a day (QID) | RECTAL | Status: DC | PRN

## 2015-05-05 MED ORDER — POTASSIUM CHLORIDE CRYS ER 20 MEQ PO TBCR
20.0000 meq | EXTENDED_RELEASE_TABLET | Freq: Once | ORAL | Status: AC
  Administered 2015-05-05: 20 meq via ORAL
  Filled 2015-05-05: qty 1

## 2015-05-05 MED ORDER — PIPERACILLIN-TAZOBACTAM 3.375 G IVPB
3.3750 g | Freq: Three times a day (TID) | INTRAVENOUS | Status: DC
  Administered 2015-05-05 – 2015-05-06 (×4): 3.375 g via INTRAVENOUS
  Filled 2015-05-05 (×4): qty 50

## 2015-05-05 MED ORDER — PIPERACILLIN-TAZOBACTAM 3.375 G IVPB
3.3750 g | Freq: Once | INTRAVENOUS | Status: AC
  Administered 2015-05-05: 3.375 g via INTRAVENOUS
  Filled 2015-05-05: qty 50

## 2015-05-05 MED ORDER — MAGNESIUM SULFATE 2 GM/50ML IV SOLN
2.0000 g | Freq: Once | INTRAVENOUS | Status: AC
  Administered 2015-05-05: 2 g via INTRAVENOUS
  Filled 2015-05-05: qty 50

## 2015-05-05 MED ORDER — ACYCLOVIR 400 MG PO TABS
400.0000 mg | ORAL_TABLET | Freq: Every day | ORAL | Status: DC
  Administered 2015-05-05 – 2015-05-09 (×5): 400 mg via ORAL
  Filled 2015-05-05 (×5): qty 1

## 2015-05-05 MED ORDER — VANCOMYCIN HCL 10 G IV SOLR
1250.0000 mg | INTRAVENOUS | Status: DC
  Administered 2015-05-05: 1250 mg via INTRAVENOUS
  Filled 2015-05-05: qty 1250

## 2015-05-05 MED ORDER — VANCOMYCIN HCL IN DEXTROSE 750-5 MG/150ML-% IV SOLN
750.0000 mg | Freq: Two times a day (BID) | INTRAVENOUS | Status: DC
  Administered 2015-05-05 – 2015-05-06 (×2): 750 mg via INTRAVENOUS
  Filled 2015-05-05 (×2): qty 150

## 2015-05-05 MED ORDER — LORATADINE 10 MG PO TABS
10.0000 mg | ORAL_TABLET | Freq: Every day | ORAL | Status: DC
  Administered 2015-05-05 – 2015-05-09 (×5): 10 mg via ORAL
  Filled 2015-05-05 (×5): qty 1

## 2015-05-05 MED ORDER — CHLORHEXIDINE GLUCONATE 0.12 % MT SOLN
15.0000 mL | Freq: Two times a day (BID) | OROMUCOSAL | Status: DC
  Administered 2015-05-05 – 2015-05-09 (×8): 15 mL via OROMUCOSAL
  Filled 2015-05-05 (×9): qty 15

## 2015-05-05 MED ORDER — OXYCODONE HCL 5 MG PO TABS
5.0000 mg | ORAL_TABLET | ORAL | Status: DC | PRN
  Filled 2015-05-05: qty 1

## 2015-05-05 MED ORDER — SODIUM CHLORIDE 0.9 % IJ SOLN
3.0000 mL | Freq: Two times a day (BID) | INTRAMUSCULAR | Status: DC
  Administered 2015-05-05 – 2015-05-08 (×4): 3 mL via INTRAVENOUS

## 2015-05-05 MED ORDER — SODIUM CHLORIDE 0.9 % IJ SOLN
10.0000 mL | INTRAMUSCULAR | Status: DC | PRN
  Administered 2015-05-05: 20 mL
  Administered 2015-05-06 – 2015-05-08 (×2): 10 mL
  Filled 2015-05-05 (×3): qty 40

## 2015-05-05 NOTE — Consult Note (Signed)
Inpatient Hematology/Oncology Consultation   Name: Manuel [name]      MRN: [id number]    Location: 1443/1443-01  Date: 05/05/2015 Time:11:06 AM   REFERRING PHYSICIAN: Dr. 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  Appears his revlimid was started around the end of August. He has had ongoing complaints of diarrhea, fatigue, weakness and decreased po intake.  Patient reported to the ED last pm with complaints of worsening weakness, malaise, anorexia and diarrhea. He complains of worsening SOB. He notes he also fell last week over hid dog and injured his L knee and right wrist. He was noted to have a fever in the ED, L knee effusion (tapped), pancytopenia with Hgb 8, platelets of 60 and total wbc 3.4 with an anc of 1500.  Note this am his Blair is 600.  Patient is currently on zosyn/vanocmycin. He is currently afebrile.  CTA of the chest was performed in the ED which showed no evidence of PE but evidence of enlarging mediastinal and upper abdominal LN since PET/CT on 04/01/2015.  Patient states that his diarrhea is unpredictable, he at times "has messed his pants." It has been ongoing over the last several weeks. Food has no smell or taste and he finds eating difficult. He denies pain, no abdominal pain. No nausea or vomiting. Weakness is increasing.  He resides in [facility] but states that if he has to "he will move here with his daughter."  PAST MEDICAL HISTORY:   Past Medical History  Diagnosis Date  . Thyroid disease   . Hyperlipemia   . Liver disease   . Malnutrition ([place]) 08/30/2013  . Hypothyroidism   . Depression     Spouse passed 5 months ago  . Gout 09/28/2013  . Fatigue 08/31/2014  . Essential hypertension 10/25/2014  . Other  malignant lymphomas of lymph nodes of multiple sites 08/30/2013    ALLERGIES: No Known Allergies    MEDICATIONS: I have reviewed the patient's current medications.     PAST SURGICAL HISTORY Past Surgical History  Procedure Laterality Date  . Back surgery      1985 and 1986  . Axillary lymph node biopsy Right 09/12/2013    Procedure: RIGHT AXILLARY LYMPH NODE BIOPSY;  Surgeon: Marcello Moores A. Cornett, MD;  Location: [place];  Service: General;  Laterality: Right;  . Portacath placement Right 09/12/2013    Procedure: INSERTION PORT-A-CATH;  Surgeon: Joyice Faster. Cornett, MD;  Location: [place];  Service: General;  Laterality: Right;    FAMILY HISTORY: Family History  Problem Relation Age of Onset  . Cancer Mother     breast ca  . Cancer Sister     NHL  . Cancer Brother     ?colon can  . Cancer Brother     lung ca    SOCIAL HISTORY:  reports that he has quit smoking. His smoking use included Cigarettes. He has a 13 pack-year smoking history. He has never used smokeless tobacco. He reports that he does not drink alcohol or use illicit drugs.  PERFORMANCE STATUS: The patient's performance status is 2 - Symptomatic, <50% confined to bed  Review of Systems  Constitutional: Positive for fever, weight loss and malaise/fatigue.  HENT: Negative.   Eyes: Negative.   Respiratory:  Positive for shortness of breath.   Cardiovascular: Negative.   Gastrointestinal: Positive for diarrhea. Negative for nausea, vomiting, abdominal pain and blood in stool.  Genitourinary: Negative.   Musculoskeletal: Positive for joint pain.  Skin: Negative.   Neurological: Positive for weakness. Negative for dizziness, tingling, tremors, sensory change, speech change, focal weakness, seizures and loss of consciousness.  Endo/Heme/Allergies: Negative.   Psychiatric/Behavioral: Negative.   14 point review of systems was performed and is negative except as detailed under history of  present illness and above   PHYSICAL EXAMINATION  ECOG PERFORMANCE STATUS: 2 - Symptomatic, <50% confined to bed  Filed Vitals:   05/05/15 0805  BP: 109/60  Pulse: 77  Temp: 98.3 F (36.8 C)  Resp: 20    Physical Exam  Constitutional: He is oriented to person, place, and time. No distress.  Pleasant alert cooperative. Does not appear toxic.   HENT:  Head: Normocephalic and atraumatic.  Mouth/Throat: Oropharynx is clear and moist. No oropharyngeal exudate.  Eyes: Conjunctivae and EOM are normal. Pupils are equal, round, and reactive to light. Right eye exhibits no discharge. Left eye exhibits no discharge. No scleral icterus.  Neck: Normal range of motion. Neck supple. No tracheal deviation present. No thyromegaly present.  Cardiovascular: Normal rate and normal heart sounds.   No murmur heard. Pulmonary/Chest: Breath sounds normal. No stridor. No respiratory distress. He has no wheezes. He has no rales. He exhibits no tenderness.  Abdominal: Soft. Bowel sounds are normal. He exhibits no distension and no mass. There is no tenderness. There is no rebound and no guarding.  Musculoskeletal: Normal range of motion. He exhibits no edema.  Lymphadenopathy:    He has no cervical adenopathy.  Neurological: He is alert and oriented to person, place, and time. No cranial nerve deficit.  Skin: Skin is warm and dry. No rash noted. He is not diaphoretic. No erythema.  Psychiatric: Mood, memory, affect and judgment normal.    LABORATORY DATA:  Results for orders placed or performed during the hospital encounter of 05/04/15 (from the past 48 hour(s))  Basic metabolic panel     Status: Abnormal   Collection Time: 05/04/15  7:45 PM  Result Value Ref Range   Sodium 136 135 - 145 mmol/L   Potassium 2.8 (L) 3.5 - 5.1 mmol/L   Chloride 105 101 - 111 mmol/L   CO2 19 (L) 22 - 32 mmol/L   Glucose, Bld 107 (H) 65 - 99 mg/dL   BUN 23 (H) 6 - 20 mg/dL   Creatinine, Ser 1.83 (H) 0.61 - 1.24 mg/dL     Calcium 8.2 (L) 8.9 - 10.3 mg/dL   GFR calc non Af Amer 35 (L) >60 mL/min   GFR calc Af Amer 41 (L) >60 mL/min    Comment: (NOTE) The eGFR has been calculated using the CKD EPI equation. This calculation has not been validated in all clinical situations. eGFR's persistently <60 mL/min signify possible Chronic Kidney Disease.    Anion gap 12 5 - 15  CBC with Differential     Status: Abnormal   Collection Time: 05/04/15  7:45 PM  Result Value Ref Range   WBC 4.1 4.0 - 10.5 K/uL   RBC 2.75 (L) 4.22 - 5.81 MIL/uL   Hemoglobin 8.1 (L) 13.0 - 17.0 g/dL   HCT 24.4 (L) 39.0 - 52.0 %   MCV 88.7 78.0 - 100.0 fL   MCH 29.5 26.0 - 34.0 pg   MCHC 33.2 30.0 - 36.0 g/dL  RDW 15.8 (H) 11.5 - 15.5 %   Platelets 60 (L) 150 - 400 K/uL    Comment: REPEATED TO VERIFY SPECIMEN CHECKED FOR CLOTS PLATELET COUNT CONFIRMED BY SMEAR    Neutrophils Relative % 33 %   Lymphocytes Relative 50 %   Monocytes Relative 10 %   Eosinophils Relative 4 %   Basophils Relative 0 %   Band Neutrophils 3 %   Metamyelocytes Relative 0 %   Myelocytes 0 %   Promyelocytes Absolute 0 %   Blasts 0 %   nRBC 0 0 /100 WBC   Other 0 %   Neutro Abs 1.5 (L) 1.7 - 7.7 K/uL   Lymphs Abs 2.0 0.7 - 4.0 K/uL   Monocytes Absolute 0.4 0.1 - 1.0 K/uL   Eosinophils Absolute 0.2 0.0 - 0.7 K/uL   Basophils Absolute 0.0 0.0 - 0.1 K/uL   RBC Morphology ELLIPTOCYTES    WBC Morphology ATYPICAL LYMPHOCYTES    Smear Review PLATELET COUNT CONFIRMED BY SMEAR     Comment: SCHISTOCYTES NOTED ON SMEAR  I-Stat CG4 Lactic Acid, ED     Status: None   Collection Time: 05/04/15  7:50 PM  Result Value Ref Range   Lactic Acid, Venous 1.48 0.5 - 2.0 mmol/L  Urinalysis, Routine w reflex microscopic     Status: Abnormal   Collection Time: 05/04/15  8:14 PM  Result Value Ref Range   Color, Urine AMBER (A) YELLOW    Comment: BIOCHEMICALS MAY BE AFFECTED BY COLOR   APPearance CLOUDY (A) CLEAR   Specific Gravity, Urine 1.023 1.005 - 1.030   pH  5.5 5.0 - 8.0   Glucose, UA NEGATIVE NEGATIVE mg/dL   Hgb urine dipstick TRACE (A) NEGATIVE   Bilirubin Urine SMALL (A) NEGATIVE   Ketones, ur NEGATIVE NEGATIVE mg/dL   Protein, ur 100 (A) NEGATIVE mg/dL   Urobilinogen, UA 1.0 0.0 - 1.0 mg/dL   Nitrite NEGATIVE NEGATIVE   Leukocytes, UA NEGATIVE NEGATIVE  Urine microscopic-add on     Status: Abnormal   Collection Time: 05/04/15  8:14 PM  Result Value Ref Range   Bacteria, UA RARE RARE   Casts HYALINE CASTS (A) NEGATIVE    Comment: GRANULAR CAST   Urine-Other AMORPHOUS URATES/PHOSPHATES     Comment: MUCOUS PRESENT  I-stat troponin, ED     Status: None   Collection Time: 05/04/15  8:28 PM  Result Value Ref Range   Troponin i, poc 0.00 0.00 - 0.08 ng/mL   Comment 3            Comment: Due to the release kinetics of cTnI, a negative result within the first hours of the onset of symptoms does not rule out myocardial infarction with certainty. If myocardial infarction is still suspected, repeat the test at appropriate intervals.   POC occult blood, ED Provider will collect     Status: None   Collection Time: 05/04/15  9:25 PM  Result Value Ref Range   Fecal Occult Bld NEGATIVE NEGATIVE  D-dimer, quantitative (not at [facility])     Status: Abnormal   Collection Time: 05/04/15  9:38 PM  Result Value Ref Range   D-Dimer, Quant 3.25 (H) 0.00 - 0.48 ug/mL-FEU    Comment:        AT THE INHOUSE ESTABLISHED CUTOFF VALUE OF 0.48 ug/mL FEU, THIS ASSAY HAS BEEN DOCUMENTED IN THE LITERATURE TO HAVE A SENSITIVITY AND NEGATIVE PREDICTIVE VALUE OF AT LEAST 98 TO 99%.  THE TEST RESULT SHOULD BE CORRELATED  WITH AN ASSESSMENT OF THE CLINICAL PROBABILITY OF DVT / VTE.   I-Stat CG4 Lactic Acid, ED     Status: None   Collection Time: 05/04/15 11:13 PM  Result Value Ref Range   Lactic Acid, Venous 0.96 0.5 - 2.0 mmol/L  Type and screen     Status: None (Preliminary result)   Collection Time: 05/04/15 11:33 PM  Result Value Ref Range    ABO/RH(D) O POS    Antibody Screen NEG    Sample Expiration 05/07/2015    Unit Number [id number]    Blood Component Type RBC, LR IRR    Unit division 00    Status of Unit ISSUED    Transfusion Status OK TO TRANSFUSE    Crossmatch Result Compatible   Protime-INR     Status: Abnormal   Collection Time: 05/04/15 11:34 PM  Result Value Ref Range   Prothrombin Time 17.2 (H) 11.6 - 15.2 seconds   INR 1.39 0.00 - 1.49  PTT     Status: Abnormal   Collection Time: 05/04/15 11:34 PM  Result Value Ref Range   aPTT 38 (H) 24 - 37 seconds    Comment:        IF BASELINE aPTT IS ELEVATED, SUGGEST PATIENT RISK ASSESSMENT BE USED TO DETERMINE APPROPRIATE ANTICOAGULANT THERAPY.   Fibrinogen     Status: None   Collection Time: 05/04/15 11:34 PM  Result Value Ref Range   Fibrinogen 448 204 - 475 mg/dL  Prepare RBC     Status: None   Collection Time: 05/04/15 11:34 PM  Result Value Ref Range   Order Confirmation ORDER PROCESSED BY BLOOD BANK   Synovial cell count + diff, w/ crystals     Status: Abnormal   Collection Time: 05/05/15 12:31 AM  Result Value Ref Range   Color, Synovial RED (A) YELLOW   Appearance-Synovial TURBID (A) CLEAR   Crystals, Fluid NO CRYSTALS SEEN    WBC, Synovial 2444 (H) 0 - 200 /cu mm   Neutrophil, Synovial 80 (H) 0 - 25 %   Lymphocytes-Synovial Fld 2 0 - 20 %   Monocyte-Macrophage-Synovial Fluid 18 (L) 50 - 90 %   Eosinophils-Synovial 0 0 - 1 %  Body fluid culture     Status: None (Preliminary result)   Collection Time: 05/05/15 12:31 AM  Result Value Ref Range   Specimen Description FLUID LEFT KNEE    Special Requests Normal    Gram Stain      NO ORGANISMS SEEN WBC PRESENT, PREDOMINANTLY PMN CYTOSPIN Gram Stain Report Called to,Read Back By and Verified With: [facility] WIL[place],RN [id number] @ 0300 BY J SCOTTON    Culture PENDING    Report Status PENDING   Magnesium     Status: Abnormal   Collection Time: 05/05/15  9:30 AM  Result Value Ref Range    Magnesium 1.4 (L) 1.7 - 2.4 mg/dL  Phosphorus     Status: None   Collection Time: 05/05/15  9:30 AM  Result Value Ref Range   Phosphorus 3.0 2.5 - 4.6 mg/dL  Comprehensive metabolic panel     Status: Abnormal   Collection Time: 05/05/15  9:30 AM  Result Value Ref Range   Sodium 135 135 - 145 mmol/L   Potassium 3.0 (L) 3.5 - 5.1 mmol/L   Chloride 110 101 - 111 mmol/L   CO2 20 (L) 22 - 32 mmol/L   Glucose, Bld 103 (H) 65 - 99 mg/dL   BUN 20 6 - 20 mg/dL  Creatinine, Ser 1.65 (H) 0.61 - 1.24 mg/dL   Calcium 7.5 (L) 8.9 - 10.3 mg/dL   Total Protein 5.0 (L) 6.5 - 8.1 g/dL   Albumin 2.6 (L) 3.5 - 5.0 g/dL   AST 25 15 - 41 U/L   ALT 25 17 - 63 U/L   Alkaline Phosphatase 60 38 - 126 U/L   Total Bilirubin 1.4 (H) 0.3 - 1.2 mg/dL   GFR calc non Af Amer 40 (L) >60 mL/min   GFR calc Af Amer 46 (L) >60 mL/min    Comment: (NOTE) The eGFR has been calculated using the CKD EPI equation. This calculation has not been validated in all clinical situations. eGFR's persistently <60 mL/min signify possible Chronic Kidney Disease.    Anion gap 5 5 - 15  CBC with Differential/Platelet     Status: Abnormal   Collection Time: 05/05/15  9:30 AM  Result Value Ref Range   WBC 3.4 (L) 4.0 - 10.5 K/uL   RBC 2.71 (L) 4.22 - 5.81 MIL/uL   Hemoglobin 8.2 (L) 13.0 - 17.0 g/dL   HCT 24.5 (L) 39.0 - 52.0 %   MCV 90.4 78.0 - 100.0 fL   MCH 30.3 26.0 - 34.0 pg   MCHC 33.5 30.0 - 36.0 g/dL   RDW 15.7 (H) 11.5 - 15.5 %   Platelets 55 (L) 150 - 400 K/uL    Comment: CONSISTENT WITH PREVIOUS RESULT   Neutrophils Relative % 18 %   Lymphocytes Relative 45 %   Monocytes Relative 28 %   Eosinophils Relative 7 %   Basophils Relative 2 %   Neutro Abs 0.6 (L) 1.7 - 7.7 K/uL   Lymphs Abs 1.5 0.7 - 4.0 K/uL   Monocytes Absolute 1.0 0.1 - 1.0 K/uL   Eosinophils Absolute 0.2 0.0 - 0.7 K/uL   Basophils Absolute 0.1 0.0 - 0.1 K/uL   RBC Morphology ELLIPTOCYTES    Smear Review SCHISTOCYTES NOTED ON SMEAR        RADIOGRAPHY: Dg Chest 2 View  05/04/2015   CLINICAL DATA:  Generalize weakness and a fall after starting chemotherapy 5 weeks ago. Fall on Thursday.  EXAM: CHEST  2 VIEW  COMPARISON:  02/10/2015.  FINDINGS: Both lateral views are degraded by patient arm position. Right sided Port-A-Cath terminates at the mid SVC. Midline trachea. Normal heart size. Atherosclerosis in the transverse aorta. No pleural effusion or pneumothorax. No congestive failure. Clear lungs.  IMPRESSION: No acute or posttraumatic deformity.  Aortic  atherosclerosis.   Electronically Signed   By: Abigail Miyamoto M.D.   On: 05/04/2015 21:25   Dg Wrist Complete Right  05/04/2015   CLINICAL DATA:  Acute onset of generalized weakness. Status post fall, with right wrist pain. Initial encounter.  EXAM: RIGHT WRIST - COMPLETE 3+ VIEW  COMPARISON:  None.  FINDINGS: There is no evidence of fracture or dislocation. The carpal rows are intact, and demonstrate normal alignment. The joint spaces are preserved. Negative ulnar variance is noted.  No significant soft tissue abnormalities are seen.  IMPRESSION: No evidence of fracture or dislocation.   Electronically Signed   By: Garald Balding M.D.   On: 05/04/2015 21:13   Ct Angio Chest Pe W/cm &/or Wo Cm  05/04/2015   CLINICAL DATA:  Positive D-dimer. Generalized weakness. Chemotherapy for lymphoma  EXAM: CT ANGIOGRAPHY CHEST WITH CONTRAST  TECHNIQUE: Multidetector CT imaging of the chest was performed using the standard protocol during bolus administration of intravenous contrast. Multiplanar CT image reconstructions and  MIPs were obtained to evaluate the vascular anatomy.  CONTRAST:  81m OMNIPAQUE IOHEXOL 350 MG/ML SOLN  COMPARISON:  PET-CT 03/22/2015  FINDINGS: THORACIC INLET/BODY WALL:  Right IJ porta catheter remains in good position.  MEDIASTINUM:  Normal heart size. No pericardial effusion. No acute vascular abnormality, including evidence of pulmonary embolism or aortic dissection.  Atherosclerosis, including the coronary arteries.  Since PET-CT, diffuse increase in mediastinal nodal size, including prevascular, upper right peritracheal, and subcarinal nodes. Subcarinal node is now 11 mm in short axis, previously 3 mm.  LUNG WINDOWS:  No consolidation.  No effusion.  UPPER ABDOMEN:  Visible portions of the spleen are clearly larger than on PET-CT. Additionally, on the second lower acquisition, there is new retroperitoneal adenopathy with periportal node measuring up to 3 cm short axis.  OSSEOUS:  No acute fracture. No suspicious lytic or blastic lesions. Stable endplate erosive changes in the lower thoracic spine.  Review of the MIP images confirms the above findings.  IMPRESSION: 1. Negative for pulmonary embolism or other acute finding. 2. Lymphoma with enlarging mediastinal and upper abdominal lymph nodes since PET-CT 03/22/2015. Please arrange follow-up with patient's oncologist.   Electronically Signed   By: JMonte FantasiaM.D.   On: 05/04/2015 23:04   Dg Knee Complete 4 Views Left  05/04/2015   CLINICAL DATA:  Acute onset of generalized weakness. Status post fall, with left knee pain. Initial encounter.  EXAM: LEFT KNEE - COMPLETE 4+ VIEW  COMPARISON:  None.  FINDINGS: There is no evidence of fracture or dislocation. The joint spaces are preserved. No significant degenerative change is seen; the patellofemoral joint is grossly unremarkable in appearance.  A moderate knee joint effusion is noted. Chondrocalcinosis is noted. An apparent soft tissue calcification is noted medially and posteriorly at the level of the distal femur.  IMPRESSION: 1. No evidence of fracture or dislocation. 2. Moderate knee joint effusion noted. 3. Chondrocalcinosis noted.   Electronically Signed   By: JGarald BaldingM.D.   On: 05/04/2015 21:15       PATHOLOGY:   Peripheral Smear Review:  Pancytopenia notes, decreased wbc's no abnormal morphology noted in wbc line. Thrombocytopenia noted  ASSESSMENT:    1. Mantle Cell Lymphoma heavily pretreated disease 2. Declining PS 3. Neutropenic Fever 4. Pancytopenia 5. Shortness of Breath 6. Diarrhea 7. Fall with L knee effusion 8. ARF  Mantle Cell Lymphoma  CTA with questionable evidence of Progession. Will let Dr. GAlvy Bimlerknow in am. Will defer any additional decision making to her of course. Patient is not unrealistic he states "it is just a matter of time when I will die from this disease." [place] in [place] Continue to hold Revlimid.  Pancytopenia  May be secondary to Revlimid. Patient has been holding medication since Friday. Continue supportive care. Keep platelet counts >/= 20K.   Diarrhea  Stool studies are ordered and pending.  Neutropenia/Fever  Patient was not neutropenic last pm on admission. Blood cultures were therefore not done.  He has been afebrile since.  He looks good. Would obtain cultures if he spikes a temp. Continue Zosyn/Vancomycin as ordered.  Anorexia/Dehydration/ARF  Continue IVF. Try to encourage po intake. Patient states food "just does not taste good."  DVT prophylaxis  TEDS/SCDS  SOB  Multifactorial, deconditioning, maybe secondary to progressive disease, anemia.  All questions were answered. This note was electronically signed. PMolli HazardMD

## 2015-05-05 NOTE — Progress Notes (Signed)
ANTIBIOTIC CONSULT NOTE - INITIAL  Pharmacy Consult for Zosyn/Vancomycin Indication: Febrile Neutropenia  No Known Allergies  Patient Measurements: Height: 6\' 3"  (190.5 cm) Weight: 197 lb 12 oz (89.7 kg) IBW/kg (Calculated) : 84.5   Vital Signs: Temp: 98.4 F (36.9 C) (10/02 0206) Temp Source: Oral (10/02 0206) BP: 110/52 mmHg (10/02 0206) Pulse Rate: 75 (10/02 0206) Intake/Output from previous day: 10/01 0701 - 10/02 0700 In: -  Out: 125 [Urine:125] Intake/Output from this shift: Total I/O In: -  Out: 125 [Urine:125]  Labs:  Recent Labs  05/04/15 1945  WBC 4.1  HGB 8.1*  PLT 60*  CREATININE 1.83*   Estimated Creatinine Clearance: 43 mL/min (by C-G formula based on Cr of 1.83). No results for input(s): VANCOTROUGH, VANCOPEAK, VANCORANDOM, GENTTROUGH, GENTPEAK, GENTRANDOM, TOBRATROUGH, TOBRAPEAK, TOBRARND, AMIKACINPEAK, AMIKACINTROU, AMIKACIN in the last 72 hours.   Microbiology: No results found for this or any previous visit (from the past 720 hour(s)).  Medical History: Past Medical History  Diagnosis Date  . Thyroid disease   . Hyperlipemia   . Liver disease   . Malnutrition ([place]) 08/30/2013  . Hypothyroidism   . Depression     Spouse passed 5 months ago  . Gout 09/28/2013  . Fatigue 08/31/2014  . Essential hypertension 10/25/2014  . 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    Medications:  Prescriptions prior to admission  Medication Sig Dispense Refill Last Dose  . acetaminophen (TYLENOL) 500 MG tablet Take 500 mg by mouth every 6 (six) hours as needed for moderate pain or fever.    Past Month at Unknown time  . acyclovir (ZOVIRAX) 400 MG tablet Take 1 tablet (400 mg total) by mouth daily. 30 tablet 6 05/04/2015 at Unknown time  . levothyroxine (SYNTHROID, LEVOTHROID) 75 MCG tablet Take 1 tablet (75 mcg total) by mouth daily. 30 tablet 6 05/04/2015 at Unknown time  . loratadine (CLARITIN) 10 MG tablet Take 10 mg by mouth  every morning.   05/04/2015 at Unknown time  . ondansetron (ZOFRAN) 8 MG tablet Take 1 tablet (8 mg total) by mouth 3 (three) times daily. 60 tablet 3 Past Month at Unknown time  . oxyCODONE (ROXICODONE) 5 MG immediate release tablet Take 1 tablet (5 mg total) by mouth every 4 (four) hours as needed for severe pain. 30 tablet 0 unknown  . prochlorperazine (COMPAZINE) 10 MG tablet Take 10 mg by mouth every 6 (six) hours as needed for nausea or vomiting.    05/04/2015 at 1500  . lenalidomide (REVLIMID) 15 MG capsule Take 1 capsule (15 mg total) by mouth daily. (Patient not taking: Reported on 05/04/2015) 21 capsule 0 Not Taking at Unknown time   Scheduled:  . acyclovir  400 mg Oral Daily  . feeding supplement (ENSURE ENLIVE)  237 mL Oral BID BM  . levothyroxine  75 mcg Oral Daily  . metoCLOPramide (REGLAN) injection  5 mg Intravenous 4 times per day  . piperacillin-tazobactam (ZOSYN)  IV  3.375 g Intravenous Once  . sodium chloride  3 mL Intravenous Q12H  . vancomycin  1,250 mg Intravenous Q24H   Infusions:  . sodium chloride 100 mL/hr at 05/05/15 0218   Assessment: 74 yoM hx of mantel cell lymphoma, s/p chemo.  Zosyn and Vancomycin per Rx for febrile neutropenia.   Goal of Therapy:  Vancomycin trough level 15-20 mcg/ml  Plan:   Zosyn 3.375 Gm IV Q8h EI  Vancomycin 1250mg  IV q24h  F/u SCr/cultures/levels  Lawana Pai R 05/05/2015,2:36  AM

## 2015-05-05 NOTE — Progress Notes (Signed)
ANTIBIOTIC CONSULT NOTE - Follow-Up  Pharmacy Consult for Zosyn/Vancomycin Indication: Fever/SIRS  No Known Allergies  Patient Measurements: Height: 6\' 3"  (190.5 cm) Weight: 200 lb 13.4 oz (91.1 kg) IBW/kg (Calculated) : 84.5   Vital Signs: Temp: 99.1 F (37.3 C) (10/02 1420) Temp Source: Oral (10/02 1420) BP: 135/46 mmHg (10/02 1420) Pulse Rate: 85 (10/02 1420) Intake/Output from previous day: 10/01 0701 - 10/02 0700 In: 1060 [I.V.:470; Blood:290; IV Piggyback:300] Out: 125 [Urine:125] Intake/Output from this shift: Total I/O In: 120 [P.O.:120] Out: -   Labs:  Recent Labs  05/04/15 1945 05/05/15 0930  WBC 4.1 3.4*  HGB 8.1* 8.2*  PLT 60* 55*  CREATININE 1.83* 1.65*   Estimated Creatinine Clearance: 47.7 mL/min (by C-G formula based on Cr of 1.65). No results for input(s): VANCOTROUGH, VANCOPEAK, VANCORANDOM, GENTTROUGH, GENTPEAK, GENTRANDOM, TOBRATROUGH, TOBRAPEAK, TOBRARND, AMIKACINPEAK, AMIKACINTROU, AMIKACIN in the last 72 hours.   Microbiology: Recent Results (from the past 720 hour(s))  Body fluid culture     Status: None (Preliminary result)   Collection Time: 05/05/15 12:31 AM  Result Value Ref Range Status   Specimen Description FLUID LEFT KNEE  Final   Special Requests Normal  Final   Gram Stain   Final    NO ORGANISMS SEEN WBC PRESENT, PREDOMINANTLY PMN CYTOSPIN Gram Stain Report Called to,Read Back By and Verified With: FELICIA WILFONG,RN [id number] @ 0300 BY J SCOTTON    Culture PENDING  Incomplete   Report Status PENDING  Incomplete    Medical History: Past Medical History  Diagnosis Date  . Thyroid disease   . Hyperlipemia   . Liver disease   . Malnutrition ([place]) 08/30/2013  . Hypothyroidism   . Depression     Spouse passed 5 months ago  . Gout 09/28/2013  . Fatigue 08/31/2014  . Essential hypertension 10/25/2014  . 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 Patient had a fall 5 days PTA, and left knee imaging showed moderate knee joint effusion. Knee tap was obtained to r/o septic joint. Pharmacy consulted to assist with dosing of Zosyn and Vancomycin for fever/SIRS.  10/2 >> Zosyn >> 10/2 >> Vancomycin >>    10/2 urine: sent 10/2 C diff: sent 10/2 stool culture: sent 10/2 L knee fluid culture: IP   Tmax 101.45F WBC 3.4, ANC 0.6 SCr improved to 1.65 with CrCl ~ 48 ml/min CG  Goal of Therapy:  Vancomycin trough level 15-20 mcg/ml  Appropriate antibiotic dosing for renal function and indication Eradication of infection   Plan:   Continue Zosyn 3.375g IV q8h (infuse over 4 hours)   Change Vancomycin to 750mg  IV q12h for improved renal function.  Plan for Vancomycin trough level at steady state.  Monitor renal function, cultures, clinical course.   Lindell Spar, PharmD, BCPS Pager: [phone] 05/05/2015 3:03 PM

## 2015-05-05 NOTE — Progress Notes (Signed)
[facility] PROGRESS NOTE  Manuel [name] AOZ:[id number] DOB: 1941/03/28 DOA: 05/04/2015 PCP: Pcp Not In System  Assessment/Plan: Manuel [name] is a 74 y.o. male past medical history of Thyroid disease; Hyperlipemia; Liver disease; Malnutrition,  Hypothyroidism; Depression; Gout , Essential hypertension, mantel cell lymphoma status post 2R-EPOCH, In August he was found to be in complete remission and was started on rituximab every 60 days and we will addition to Revlimid. 5 days ago patient tripped and fell. He had injured his right wrist and left knee. Patietn have been treated with Revlemid fot his lymphoma this was discontinued this week because Patient have had significant diarrhea but refuses to take Imodium. He was noted to be febrile up to 101.5  Acute renal failure: related to hemodynamic, hypovolemia.  continue with IV fluids.   Fever/ SIRS;  Diarrhea improved. Follow stool  culture.  C diff pending. Influenza pending.  Chest x ray negative. Follow urine culture.   Mantle cell lymphoma ; recurrence probably, oncology consulted.  Pancytopenia; might be related to Revlimid.   Hypokalemia: replete oral KCL supplement.   knee effusion: suspect related to recent trauma. On PE no redness notice. Will follow culture, if bacteria present will consult ortho.  culture: no organism present, synovial: negative for crystal, WBC 2444, neutrophils 80.   Prolonged QTC ; replete Mg.  Elevated D dimer; check doppler. CT angio negative for PE.    Code Status: Full Code.  Family Communication: Care discussed with patient.  Disposition Plan: Remain inpatient.    Consultants:  Oncology  Procedures:  none  Antibiotics:  Vancomycin 10-02  Zosyn 10-02  HPI/Subjective: He is feeling better, less weak. No diarrhea today   Objective: Filed Vitals:   05/05/15 0805  BP: 109/60  Pulse: 77  Temp: 98.3 F (36.8 C)  Resp: 20    Intake/Output Summary (Last 24 hours) at  05/05/15 0842 Last data filed at 05/05/15 0700  Gross per 24 hour  Intake   1060 ml  Output    125 ml  Net    935 ml   Filed Weights   05/05/15 0206 05/05/15 0805  Weight: 89.7 kg (197 lb 12 oz) 91.1 kg (200 lb 13.4 oz)    Exam:   General:  Alert in no distress  Cardiovascular: S 1, S 2 RRR  Respiratory: CTA  Abdomen: BS present, soft,.   Musculoskeletal: no edema  Data Reviewed: Basic Metabolic Panel:  Recent Labs Lab 05/04/15 1945  NA 136  K 2.8*  CL 105  CO2 19*  GLUCOSE 107*  BUN 23*  CREATININE 1.83*  CALCIUM 8.2*   Liver Function Tests: No results for input(s): AST, ALT, ALKPHOS, BILITOT, PROT, ALBUMIN in the last 168 hours. No results for input(s): LIPASE, AMYLASE in the last 168 hours. No results for input(s): AMMONIA in the last 168 hours. CBC:  Recent Labs Lab 05/04/15 1945  WBC 4.1  NEUTROABS 1.5*  HGB 8.1*  HCT 24.4*  MCV 88.7  PLT 60*   Cardiac Enzymes: No results for input(s): CKTOTAL, CKMB, CKMBINDEX, TROPONINI in the last 168 hours. BNP (last 3 results) No results for input(s): BNP in the last 8760 hours.  ProBNP (last 3 results) No results for input(s): PROBNP in the last 8760 hours.  CBG: No results for input(s): GLUCAP in the last 168 hours.  Recent Results (from the past 240 hour(s))  Body fluid culture     Status: None (Preliminary result)   Collection Time: 05/05/15 12:31 AM  Result Value  Ref Range Status   Specimen Description FLUID LEFT KNEE  Final   Special Requests Normal  Final   Gram Stain   Final    NO ORGANISMS SEEN WBC PRESENT, PREDOMINANTLY PMN CYTOSPIN Gram Stain Report Called to,Read Back By and Verified With: FELICIA WILFONG,RN [id number] @ 0300 BY J SCOTTON    Culture PENDING  Incomplete   Report Status PENDING  Incomplete     Studies: Dg Chest 2 View  05/04/2015   CLINICAL DATA:  Generalize weakness and a fall after starting chemotherapy 5 weeks ago. Fall on Thursday.  EXAM: CHEST  2 VIEW   COMPARISON:  02/10/2015.  FINDINGS: Both lateral views are degraded by patient arm position. Right sided Port-A-Cath terminates at the mid SVC. Midline trachea. Normal heart size. Atherosclerosis in the transverse aorta. No pleural effusion or pneumothorax. No congestive failure. Clear lungs.  IMPRESSION: No acute or posttraumatic deformity.  Aortic  atherosclerosis.   Electronically Signed   By: Abigail Miyamoto M.D.   On: 05/04/2015 21:25   Dg Wrist Complete Right  05/04/2015   CLINICAL DATA:  Acute onset of generalized weakness. Status post fall, with right wrist pain. Initial encounter.  EXAM: RIGHT WRIST - COMPLETE 3+ VIEW  COMPARISON:  None.  FINDINGS: There is no evidence of fracture or dislocation. The carpal rows are intact, and demonstrate normal alignment. The joint spaces are preserved. Negative ulnar variance is noted.  No significant soft tissue abnormalities are seen.  IMPRESSION: No evidence of fracture or dislocation.   Electronically Signed   By: Garald Balding M.D.   On: 05/04/2015 21:13   Ct Angio Chest Pe W/cm &/or Wo Cm  05/04/2015   CLINICAL DATA:  Positive D-dimer. Generalized weakness. Chemotherapy for lymphoma  EXAM: CT ANGIOGRAPHY CHEST WITH CONTRAST  TECHNIQUE: Multidetector CT imaging of the chest was performed using the standard protocol during bolus administration of intravenous contrast. Multiplanar CT image reconstructions and MIPs were obtained to evaluate the vascular anatomy.  CONTRAST:  41mL OMNIPAQUE IOHEXOL 350 MG/ML SOLN  COMPARISON:  PET-CT 03/22/2015  FINDINGS: THORACIC INLET/BODY WALL:  Right IJ porta catheter remains in good position.  MEDIASTINUM:  Normal heart size. No pericardial effusion. No acute vascular abnormality, including evidence of pulmonary embolism or aortic dissection. Atherosclerosis, including the coronary arteries.  Since PET-CT, diffuse increase in mediastinal nodal size, including prevascular, upper right peritracheal, and subcarinal nodes.  Subcarinal node is now 11 mm in short axis, previously 3 mm.  LUNG WINDOWS:  No consolidation.  No effusion.  UPPER ABDOMEN:  Visible portions of the spleen are clearly larger than on PET-CT. Additionally, on the second lower acquisition, there is new retroperitoneal adenopathy with periportal node measuring up to 3 cm short axis.  OSSEOUS:  No acute fracture. No suspicious lytic or blastic lesions. Stable endplate erosive changes in the lower thoracic spine.  Review of the MIP images confirms the above findings.  IMPRESSION: 1. Negative for pulmonary embolism or other acute finding. 2. Lymphoma with enlarging mediastinal and upper abdominal lymph nodes since PET-CT 03/22/2015. Please arrange follow-up with patient's oncologist.   Electronically Signed   By: Monte Fantasia M.D.   On: 05/04/2015 23:04   Dg Knee Complete 4 Views Left  05/04/2015   CLINICAL DATA:  Acute onset of generalized weakness. Status post fall, with left knee pain. Initial encounter.  EXAM: LEFT KNEE - COMPLETE 4+ VIEW  COMPARISON:  None.  FINDINGS: There is no evidence of fracture or dislocation. The joint spaces  are preserved. No significant degenerative change is seen; the patellofemoral joint is grossly unremarkable in appearance.  A moderate knee joint effusion is noted. Chondrocalcinosis is noted. An apparent soft tissue calcification is noted medially and posteriorly at the level of the distal femur.  IMPRESSION: 1. No evidence of fracture or dislocation. 2. Moderate knee joint effusion noted. 3. Chondrocalcinosis noted.   Electronically Signed   By: Garald Balding M.D.   On: 05/04/2015 21:15    Scheduled Meds: . acyclovir  400 mg Oral Daily  . antiseptic oral rinse  7 mL Mouth Rinse q12n4p  . chlorhexidine  15 mL Mouth Rinse BID  . feeding supplement (ENSURE ENLIVE)  237 mL Oral BID BM  . levothyroxine  75 mcg Oral Daily  . metoCLOPramide (REGLAN) injection  5 mg Intravenous 4 times per day  . piperacillin-tazobactam  (ZOSYN)  IV  3.375 g Intravenous Q8H  . sodium chloride  3 mL Intravenous Q12H  . vancomycin  1,250 mg Intravenous Q24H   Continuous Infusions: . sodium chloride 100 mL/hr at 05/05/15 0218    Active Problems:   Mantle cell lymphoma ([facility])   Thrombocytopenia ([facility])   Essential hypertension   Fever   Hypokalemia   Dehydration   Acute renal failure (ARF) ([facility])   SIRS (systemic inflammatory response syndrome) ([facility])   QT prolongation    Time spent: 35 minutes.     Niel Hummer A  [facility] Pager [phone]. If 7PM-7AM, please contact night-coverage at [email or web address], password [facility] 05/05/2015, 8:42 AM  LOS: 0 days

## 2015-05-05 NOTE — Progress Notes (Signed)
Left knee aspiration results called to on-call provider. Synovial fluid- no organisms seen, WBC present, predominantly PMN. No new orders received. Will continue to monitor and follow the POC.

## 2015-05-05 NOTE — ED Notes (Addendum)
Per Bill in blood bank, pt's blood cannot be irradiated d/t the water being off. Will let provider know. Dr. Roel Cluck is aware.

## 2015-05-06 ENCOUNTER — Other Ambulatory Visit: Payer: Self-pay | Admitting: Hematology and Oncology

## 2015-05-06 ENCOUNTER — Inpatient Hospital Stay (HOSPITAL_COMMUNITY): Payer: Medicare PPO

## 2015-05-06 DIAGNOSIS — N19 Unspecified kidney failure: Secondary | ICD-10-CM

## 2015-05-06 DIAGNOSIS — Z515 Encounter for palliative care: Secondary | ICD-10-CM

## 2015-05-06 DIAGNOSIS — R791 Abnormal coagulation profile: Secondary | ICD-10-CM

## 2015-05-06 DIAGNOSIS — D630 Anemia in neoplastic disease: Secondary | ICD-10-CM

## 2015-05-06 DIAGNOSIS — E876 Hypokalemia: Secondary | ICD-10-CM

## 2015-05-06 DIAGNOSIS — R197 Diarrhea, unspecified: Secondary | ICD-10-CM

## 2015-05-06 DIAGNOSIS — D696 Thrombocytopenia, unspecified: Secondary | ICD-10-CM

## 2015-05-06 DIAGNOSIS — R161 Splenomegaly, not elsewhere classified: Secondary | ICD-10-CM

## 2015-05-06 DIAGNOSIS — E46 Unspecified protein-calorie malnutrition: Secondary | ICD-10-CM

## 2015-05-06 DIAGNOSIS — M25512 Pain in left shoulder: Secondary | ICD-10-CM

## 2015-05-06 LAB — TYPE AND SCREEN
ABO/RH(D): O POS
Antibody Screen: NEGATIVE
Unit division: 0

## 2015-05-06 LAB — BASIC METABOLIC PANEL
Anion gap: 7 (ref 5–15)
BUN: 17 mg/dL (ref 6–20)
CALCIUM: 7.7 mg/dL — AB (ref 8.9–10.3)
CO2: 20 mmol/L — ABNORMAL LOW (ref 22–32)
CREATININE: 1.46 mg/dL — AB (ref 0.61–1.24)
Chloride: 110 mmol/L (ref 101–111)
GFR calc non Af Amer: 46 mL/min — ABNORMAL LOW (ref >60)
GFR, EST AFRICAN AMERICAN: 53 mL/min — AB (ref >60)
Glucose, Bld: 90 mg/dL (ref 65–99)
Potassium: 2.9 mmol/L — ABNORMAL LOW (ref 3.5–5.1)
SODIUM: 137 mmol/L (ref 135–145)

## 2015-05-06 LAB — CBC
HCT: 22.8 % — ABNORMAL LOW (ref 39.0–52.0)
Hemoglobin: 7.5 g/dL — ABNORMAL LOW (ref 13.0–17.0)
MCH: 29.5 pg (ref 26.0–34.0)
MCHC: 32.9 g/dL (ref 30.0–36.0)
MCV: 89.8 fL (ref 78.0–100.0)
PLATELETS: 46 10*3/uL — AB (ref 150–400)
RBC: 2.54 MIL/uL — ABNORMAL LOW (ref 4.22–5.81)
RDW: 15.6 % — AB (ref 11.5–15.5)
WBC: 4.6 10*3/uL (ref 4.0–10.5)

## 2015-05-06 LAB — URINE CULTURE: Culture: NO GROWTH

## 2015-05-06 MED ORDER — POTASSIUM CHLORIDE CRYS ER 20 MEQ PO TBCR
40.0000 meq | EXTENDED_RELEASE_TABLET | Freq: Once | ORAL | Status: AC
  Administered 2015-05-06: 40 meq via ORAL
  Filled 2015-05-06: qty 2

## 2015-05-06 MED ORDER — METRONIDAZOLE 500 MG PO TABS
500.0000 mg | ORAL_TABLET | Freq: Three times a day (TID) | ORAL | Status: DC
  Administered 2015-05-06 – 2015-05-09 (×9): 500 mg via ORAL
  Filled 2015-05-06 (×9): qty 1

## 2015-05-06 NOTE — Progress Notes (Signed)
[place] CONSULT NOTE  Patient Care Team: Pcp Not In System as PCP - Chrisman, MD as Referring Physician (Hematology and Oncology)  CHIEF COMPLAINTS/PURPOSE OF CONSULTATION:  Recurrent mantle cell lymphoma, admitted for pancytopenia, diarrhea and failure to thrive  HISTORY OF PRESENTING ILLNESS:  Manuel [name] 74 y.o. male was admitted over the weekend, presented with weakness, malaise, diarrhea and pancytopenia. This patient have recurrent mantle cell lymphoma. Summary of oncologic history as follows: Oncology History   Mantle cell lymphoma   Primary site: Lymphoid Neoplasms (Bilateral)   Staging method: AJCC 6th Edition   Clinical: Stage IV signed by Heath Lark, MD on 09/26/2013 10:53 AM   Pathologic: Stage IV signed by Heath Lark, MD on 09/26/2013 10:53 AM   Summary: Stage IV       Mantle cell lymphoma ([place])   09/12/2013 Procedure Patient have right axillary lymph node biopsy that confirmed B-cell, non-Hodgkin's lymphoma, suspect mantle cell lymphoma   09/12/2013 Procedure Patient had placement of Infuse-a-Port   09/16/2013 Imaging PET/CT scan showed multifocal hypermetabolic nodal activity involving the neck, chest, abdomen and pelvis as described. In addition, there is moderate splenomegaly with associated hypermetabolic activity consistent with lymphomatous involvement   09/21/2013 - 01/26/2014 Chemotherapy The patient received 6 cycles of bendamustine and rituximab   01/24/2014 Imaging Repeat PET/CT scan showed near complete response to treatment.   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   07/13/2014 Imaging  repeat PET CT scan show complete remission   08/10/2014 Imaging  repeat CT scan of the neck, chest and abdomen show disease relapse   08/16/2014 - 08/22/2014 Hospital Admission He was admitted to the hospital because of respiratory failure, signs of sepsis and severe pancytopenia    08/17/2014 - 01/24/2015 Chemotherapy He was started on Ibrutinib and high dose prednisone, treatment stopped due to recurrent disease   10/24/2014 Imaging Repeat PET scan show marked reduction in splenomegaly and resolving hypermetabolic activity   6/96/7893 Imaging ECHO showed normal EF   01/25/2015 Imaging CT scan of chest, abdomen and pelvis showed recurrent splenomegaly and lymphadenopathy   01/28/2015 - 02/01/2015 Hospital Admission He was admitted to the hospital for cycle one of [place]   02/10/2015 - 02/14/2015 Hospital Admission He was admitted to the hospital for neutropenic fever and received anti-biotics and blood transfusion   02/25/2015 - 03/01/2015 Hospital Admission He was admitted to the hospital to receive cycle 2 R-EPOCH   03/22/2015 Imaging PET CT scan showed complete remission to treatment   03/25/2015 -  Chemotherapy The patient started to maintenance rituximab every 60 days along with Revlimid   The patient has been getting weekly blood draw at [facility]. He stated that he has been complaining of left shoulder pain, poor appetite, recurrent diarrhea and severe weakness. On admission, his blood count revealed pancytopenia. CT angiogram showed possible disease recurrence. His diarrhea had slightly improved. He has only one episode of watery bowel movement this morning. He complained of generalized weakness.  MEDICAL HISTORY:  Past Medical History  Diagnosis Date  . Thyroid disease   . Hyperlipemia   . Liver disease   . Malnutrition ([place]) 08/30/2013  . Hypothyroidism   . Depression     Spouse passed 5 months ago  . Gout 09/28/2013  . Fatigue 08/31/2014  . Essential hypertension 10/25/2014  . 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 Back surgery      1985 and 1986  . Axillary lymph node biopsy Right 09/12/2013    Procedure: RIGHT AXILLARY LYMPH NODE BIOPSY;  Surgeon:  Marcello Moores A. Cornett, MD;  Location: [place];  Service: General;  Laterality: Right;  . Portacath placement Right 09/12/2013    Procedure: INSERTION PORT-A-CATH;  Surgeon: Joyice Faster. Cornett, MD;  Location: [place];  Service: General;  Laterality: Right;    SOCIAL HISTORY: Social History   Social History  . Marital Status: Widowed    Spouse Name: N/A  . Number of Children: N/A  . Years of Education: N/A   Occupational History  . Not on file.   Social History Main Topics  . Smoking status: Former Smoker -- 0.25 packs/day for 52 years    Types: Cigarettes  . Smokeless tobacco: Never Used  . Alcohol Use: No  . Drug Use: No  . Sexual Activity: Not on file   Other Topics Concern  . Not on file   Social History Narrative    FAMILY HISTORY: Family History  Problem Relation Age of Onset  . Cancer Mother     breast ca  . Cancer Sister     NHL  . Cancer Brother     ?colon can  . Cancer Brother     lung ca    ALLERGIES:  has No Known Allergies.  MEDICATIONS:  Current Facility-Administered Medications  Medication Dose Route Frequency Provider Last Rate Last Dose  . 0.9 %  sodium chloride infusion   Intravenous Continuous Belkys A Regalado, MD 75 mL/hr at 05/05/15 2304    . acetaminophen (TYLENOL) tablet 650 mg  650 mg Oral Q6H PRN Toy Baker, MD       Or  . acetaminophen (TYLENOL) suppository 650 mg  650 mg Rectal Q6H PRN Toy Baker, MD      . acyclovir (ZOVIRAX) tablet 400 mg  400 mg Oral Daily Toy Baker, MD   400 mg at 05/06/15 0956  . antiseptic oral rinse (CPC / CETYLPYRIDINIUM CHLORIDE 0.05%) solution 7 mL  7 mL Mouth Rinse q12n4p Belkys A Regalado, MD   7 mL at 05/05/15 1600  . chlorhexidine (PERIDEX) 0.12 % solution 15 mL  15 mL Mouth Rinse BID Belkys A Regalado, MD   15 mL at 05/06/15 0956  . feeding supplement (ENSURE ENLIVE) (ENSURE ENLIVE) liquid 237 mL  237 mL Oral BID BM Toy Baker, MD   237 mL at  05/06/15 0956  . levothyroxine (SYNTHROID, LEVOTHROID) tablet 75 mcg  75 mcg Oral Daily Toy Baker, MD   75 mcg at 05/06/15 0801  . loratadine (CLARITIN) tablet 10 mg  10 mg Oral Daily Belkys A Regalado, MD   10 mg at 05/06/15 0956  . metoCLOPramide (REGLAN) injection 5 mg  5 mg Intravenous 4 times per day Toy Baker, MD   5 mg at 05/06/15 0516  . morphine 2 MG/ML injection 2 mg  2 mg Intravenous Q4H PRN Toy Baker, MD      . oxyCODONE (Oxy IR/ROXICODONE) immediate release tablet 5 mg  5 mg Oral Q4H PRN Toy Baker, MD      . piperacillin-tazobactam (ZOSYN) IVPB 3.375 g  3.375 g Intravenous Q8H Dorrene German, RPH   3.375 g at 05/06/15 0956  . sodium chloride 0.9 % injection 10-40 mL  10-40 mL Intracatheter PRN Belkys A Regalado, MD   10 mL at 05/06/15 0301  . sodium chloride  0.9 % injection 3 mL  3 mL Intravenous Q12H Toy Baker, MD   3 mL at 05/06/15 0958  . vancomycin (VANCOCIN) IVPB 750 mg/150 ml premix  750 mg Intravenous Q12H Belkys A Regalado, MD   750 mg at 05/06/15 0956    REVIEW OF SYSTEMS:   Constitutional: Denies fevers, chills or abnormal night sweats Eyes: Denies blurriness of vision, double vision or watery eyes Ears, nose, mouth, throat, and face: Denies mucositis or sore throat Respiratory: Denies cough, dyspnea or wheezes Cardiovascular: Denies palpitation, chest discomfort or lower extremity swelling Skin: Denies abnormal skin rashes Lymphatics: Denies new lymphadenopathy or easy bruising Behavioral/Psych: Mood is stable, no new changes  All other systems were reviewed with the patient and are negative.  PHYSICAL EXAMINATION: ECOG PERFORMANCE STATUS: 2 - Symptomatic, <50% confined to bed  Filed Vitals:   05/06/15 0500  BP: 146/56  Pulse: 76  Temp: 99.1 F (37.3 C)  Resp: 20   Filed Weights   05/05/15 0206 05/05/15 0805  Weight: 197 lb 12 oz (89.7 kg) 200 lb 13.4 oz (91.1 kg)    GENERAL:alert, no distress and  comfortable SKIN: skin color, texture, turgor are normal, no rashes or significant lesions EYES: normal, conjunctiva are pink and non-injected, sclera clear OROPHARYNX:no exudate, no erythema and lips, buccal mucosa, and tongue normal  NECK: supple, thyroid normal size, non-tender, without nodularity LYMPH:  no palpable lymphadenopathy in the cervical, axillary or inguinal LUNGS: clear to auscultation and percussion with normal breathing effort HEART: regular rate & rhythm and no murmurs and no lower extremity edema ABDOMEN:abdomen soft, non-tender and normal bowel sounds. Palpable splenomegaly Musculoskeletal:no cyanosis of digits and no clubbing  PSYCH: alert & oriented x 3 with fluent speech NEURO: no focal motor/sensory deficits  LABORATORY DATA:  I have reviewed the data as listed Lab Results  Component Value Date   WBC 4.6 05/06/2015   HGB 7.5* 05/06/2015   HCT 22.8* 05/06/2015   MCV 89.8 05/06/2015   PLT 46* 05/06/2015    Recent Labs  03/11/15 0821 03/25/15 0830 05/04/15 1945 05/05/15 0930 05/06/15 0301  NA 143 145 136 135 137  K 4.0 4.4 2.8* 3.0* 2.9*  CL  --   --  105 110 110  CO2 25 25 19* 20* 20*  GLUCOSE 119 111 107* 103* 90  BUN 8.2 12.8 23* 20 17  CREATININE 1.4* 1.3 1.83* 1.65* 1.46*  CALCIUM 8.9 9.4 8.2* 7.5* 7.7*  GFRNONAA  --   --  35* 40* 46*  GFRAA  --   --  41* 46* 53*  PROT 5.9* 6.1*  --  5.0*  --   ALBUMIN 3.1* 3.6  --  2.6*  --   AST 13 14  --  25  --   ALT 21 16  --  25  --   ALKPHOS 78 74  --  60  --   BILITOT 0.37 0.32  --  1.4*  --     RADIOGRAPHIC STUDIES: I have personally reviewed the radiological images as listed and agreed with the findings in the report. Dg Chest 2 View  05/04/2015   CLINICAL DATA:  Generalize weakness and a fall after starting chemotherapy 5 weeks ago. Fall on Thursday.  EXAM: CHEST  2 VIEW  COMPARISON:  02/10/2015.  FINDINGS: Both lateral views are degraded by patient arm position. Right sided Port-A-Cath  terminates at the mid SVC. Midline trachea. Normal heart size. Atherosclerosis in the transverse aorta. No pleural effusion or pneumothorax.  No congestive failure. Clear lungs.  IMPRESSION: No acute or posttraumatic deformity.  Aortic  atherosclerosis.   Electronically Signed   By: Abigail Miyamoto M.D.   On: 05/04/2015 21:25   Dg Wrist Complete Right  05/04/2015   CLINICAL DATA:  Acute onset of generalized weakness. Status post fall, with right wrist pain. Initial encounter.  EXAM: RIGHT WRIST - COMPLETE 3+ VIEW  COMPARISON:  None.  FINDINGS: There is no evidence of fracture or dislocation. The carpal rows are intact, and demonstrate normal alignment. The joint spaces are preserved. Negative ulnar variance is noted.  No significant soft tissue abnormalities are seen.  IMPRESSION: No evidence of fracture or dislocation.   Electronically Signed   By: Garald Balding M.D.   On: 05/04/2015 21:13   Ct Angio Chest Pe W/cm &/or Wo Cm  05/04/2015   CLINICAL DATA:  Positive D-dimer. Generalized weakness. Chemotherapy for lymphoma  EXAM: CT ANGIOGRAPHY CHEST WITH CONTRAST  TECHNIQUE: Multidetector CT imaging of the chest was performed using the standard protocol during bolus administration of intravenous contrast. Multiplanar CT image reconstructions and MIPs were obtained to evaluate the vascular anatomy.  CONTRAST:  86mL OMNIPAQUE IOHEXOL 350 MG/ML SOLN  COMPARISON:  PET-CT 03/22/2015  FINDINGS: THORACIC INLET/BODY WALL:  Right IJ porta catheter remains in good position.  MEDIASTINUM:  Normal heart size. No pericardial effusion. No acute vascular abnormality, including evidence of pulmonary embolism or aortic dissection. Atherosclerosis, including the coronary arteries.  Since PET-CT, diffuse increase in mediastinal nodal size, including prevascular, upper right peritracheal, and subcarinal nodes. Subcarinal node is now 11 mm in short axis, previously 3 mm.  LUNG WINDOWS:  No consolidation.  No effusion.  UPPER ABDOMEN:   Visible portions of the spleen are clearly larger than on PET-CT. Additionally, on the second lower acquisition, there is new retroperitoneal adenopathy with periportal node measuring up to 3 cm short axis.  OSSEOUS:  No acute fracture. No suspicious lytic or blastic lesions. Stable endplate erosive changes in the lower thoracic spine.  Review of the MIP images confirms the above findings.  IMPRESSION: 1. Negative for pulmonary embolism or other acute finding. 2. Lymphoma with enlarging mediastinal and upper abdominal lymph nodes since PET-CT 03/22/2015. Please arrange follow-up with patient's oncologist.   Electronically Signed   By: Monte Fantasia M.D.   On: 05/04/2015 23:04   Dg Knee Complete 4 Views Left  05/04/2015   CLINICAL DATA:  Acute onset of generalized weakness. Status post fall, with left knee pain. Initial encounter.  EXAM: LEFT KNEE - COMPLETE 4+ VIEW  COMPARISON:  None.  FINDINGS: There is no evidence of fracture or dislocation. The joint spaces are preserved. No significant degenerative change is seen; the patellofemoral joint is grossly unremarkable in appearance.  A moderate knee joint effusion is noted. Chondrocalcinosis is noted. An apparent soft tissue calcification is noted medially and posteriorly at the level of the distal femur.  IMPRESSION: 1. No evidence of fracture or dislocation. 2. Moderate knee joint effusion noted. 3. Chondrocalcinosis noted.   Electronically Signed   By: Garald Balding M.D.   On: 05/04/2015 21:15    ASSESSMENT & PLAN:  Recurrent mantle cell lymphoma Imaging study and examination revealed palpable splenomegaly, consistent with disease relapse. I will discontinue Revlimid. The patient is made aware multiple times that his prognosis is poor. I recommend consideration for palliative care and hospice but the patient is not willing to stop treatment yet. He desires more treatment in the new future. The only  treatment left would be to consider repeat  treatment with bendamustine with rituximab or Ofatumumab. 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 The patient denies recent history of bleeding such as epistaxis, hematuria or hematochezia. He is asymptomatic from the anemia. I will observe for now.  Consider transfusion if hemoglobin drops further  Thrombocytopenia and splenomegaly, due to recurrence of mantle cell lymphoma This is due to disease. Discontinue pharmacology DVT prophylaxis with blood count less than 50,000 due to risk of bleeding  Diarrhea, C. difficile toxin negative, C. difficile antigen positive, likely colonization Cultures pending. Consider stopping antibiotics treatment if cultures were negative thinking   Prerenal acute renal failure, improving Continue IV fluid hydration  Hypokalemia likely secondary to poor oral intake and antibiotics side effects He is on potassium replacement therapy  Protein calorie malnutrition I appreciate nutritional evaluation and consult.  Discharge planning Not ready for discharge due to [facility] diarrhea  Left shoulder pain This is due to referred pain from splenomegaly. Continue oxycodone as needed.   Palliative care encounter I discussed CODE STATUS with the patient. He is considering DO NOT RESUSCITATE/DO NOT INTUBATE Daughter is the MPOA I recommend palliative care consult to address chronic pain management and goals of care with the patient  All questions were answered. The patient knows to call the clinic with any problems, questions or concerns.    [place], Cecil, MD 05/06/2015 10:00 AM

## 2015-05-06 NOTE — Progress Notes (Signed)
Initial Nutrition Assessment  DOCUMENTATION CODES:   Non-severe (moderate) malnutrition in context of chronic illness  INTERVENTION:  - Continue Ensure Enlive BID, each supplement provides 350 kcal and 20 grams of protein - Encourage intakes of meals and supplements - RD will continue to monitor for needs  NUTRITION DIAGNOSIS:   Increased nutrient needs related to catabolic illness, cancer and cancer related treatments as evidenced by estimated needs.  GOAL:   Patient will meet greater than or equal to 90% of their needs  MONITOR:   PO intake, Supplement acceptance, Weight trends, Labs, I & O's  REASON FOR ASSESSMENT:   Malnutrition Screening Tool  ASSESSMENT:   Patient with known hx of mantel cell lymphoma status post 2R-EPOCH. In August he was found to be in complete remission and was started on rituximab every 60 days and we will addition to Revlimid. [facility] at [facility] but comes back to see Dr. Alvy Bimler for treatment  Pt seen for MST. BMI indicates overweight status. Pt reports that his appetite has been poor for several months. He states that no foods sound good or taste good and that when he does attempt to eat he will eat a few bites and no longer want what he is eating due to taste or feeling full. He states that PTA he was also dehydrated and due to drinking fluids to help with this it was very difficult for him to also eat solid foods.   Pt reports he often eats "very little" at all meals. He states that all foods taste "bad" and mainly describes this as a bland, lack of flavor occurrence. Because of these things he does not have the desire to eat. He was drinking 2 Carnation Instant breakfast cans with 2 teaspoons of powdered milk in each can PTA. RN reports pt drank Ensure without issue earlier and she opened a second one for pt while RD was talking with pt. Asked pt if he would prefer to have Carnation Instant breakfast instead of Ensure but he declined offer.    He states that he usually weighs himself at home and that he last weighed himself 2 weeks ago. He believes that he weighed 211 lbs at that time. This would indicate 11 lb weight loss (5% body weight) in 2 weeks which is significant for time frame. Chart review shows 4 lbs weight loss (2% body weight) in the past 5 weeks which is not significant for time frame. Moderate fat and moderate and severe muscle wasting noted during physical exam.  Pt does not have any teeth and states that his dentures are currently at his daughter's house. PT came to work with pt as RD was leaving the room.  Not meeting needs. Medications reviewed. Labs reviewed; K: 2.9 mmol/L, creatinine elevated, Ca: 7.7 mg/dL, GFR: 46.   Diet Order:  Diet Heart Room service appropriate?: Yes; Fluid consistency:: Thin  Skin:  Reviewed, no issues  Last BM:  10/3  Height:   Ht Readings from Last 1 Encounters:  05/05/15 6\' 3"  (1.905 m)    Weight:   Wt Readings from Last 1 Encounters:  05/05/15 200 lb 13.4 oz (91.1 kg)    Ideal Body Weight:  89.09 kg (kg)  BMI:  Body mass index is 25.1 kg/(m^2).  Estimated Nutritional Needs:   Kcal:  5916-3846  Protein:  95-105 grams  Fluid:  2.2-2.5 L/day  EDUCATION NEEDS:   No education needs identified at this time     Jarome Matin, RD, LDN  Inpatient Clinical Dietitian Pager # [phone] After hours/weekend pager # [phone]

## 2015-05-06 NOTE — Evaluation (Signed)
Physical Therapy Evaluation Patient Details Name: Manuel [name] MRN: [id number] DOB: 1940/10/12 Today's Date: 05/06/2015   History of Present Illness  74 y.o. malepast medical history of Thyroid disease, Hyperlipemia, Liver disease, Malnutrition,Hypothyroidism, Depression, Gout , Essential hypertension, mantel cell lymphoma admitted for acute renal failure, fever/SIRS  Clinical Impression  Pt admitted with above diagnosis. Pt currently with functional limitations due to the deficits listed below (see PT Problem List).  Pt will benefit from skilled PT to increase their independence and safety with mobility to allow discharge to the venue listed below.  Pt able to perform stand pivot to recliner however declined ambulation today.  Pt tends to limit weight bearing to L LE due to knee pain/swelling and encouraged using assistive device for pain control as well as ice.  Pt uncertain of d/c plans at this time but states he ultimately wishes to drive back to [facility].     Follow Up Recommendations Supervision - Intermittent;Home health PT (if pt wishes to pursue)    Equipment Recommendations  None recommended by PT    Recommendations for Other Services       Precautions / Restrictions Precautions Precautions: Fall      Mobility  Bed Mobility Overal bed mobility: Modified Independent                Transfers Overall transfer level: Needs assistance Equipment used: None Transfers: Sit to/from [employer] Transfers Sit to Stand: Min guard Stand pivot transfers: Min guard       General transfer comment: min/guard for safety however pt did not require assist, able to take a few antalgic steps over to recliner, reports L knee feeling better and denies pain  Ambulation/Gait Ambulation/Gait assistance:  (pt declined today)              Stairs            Wheelchair Mobility    Modified Rankin (Stroke Patients Only)       Balance                                             Pertinent Vitals/Pain Pain Assessment: No/denies pain (reports L knee has been painful however no current pain)    Home Living Family/patient expects to be discharged to:: Private residence Living Arrangements: Alone   Type of Home: House       Home Layout: Two level;Able to live on main level with bedroom/bathroom Home Equipment: Gilford Rile - 2 wheels;Cane - single point;Crutches;Wheelchair - manual      Prior Function Level of Independence: Independent               Hand Dominance        Extremity/Trunk Assessment               Lower Extremity Assessment: LLE deficits/detail;Generalized weakness   LLE Deficits / Details: able to perform AROM of knee without pain however limits weight bearing on L LE when standing     Communication   Communication: No difficulties  Cognition Arousal/Alertness: Awake/alert Behavior During Therapy: WFL for tasks assessed/performed Overall Cognitive Status: Within Functional Limits for tasks assessed                      General Comments General comments (skin integrity, edema, etc.): L knee swelling however pt reports much better since admission  Exercises        Assessment/Plan    PT Assessment Patient needs continued PT services  PT Diagnosis Difficulty walking   PT Problem List Decreased strength;Decreased activity tolerance;Decreased mobility  PT Treatment Interventions DME instruction;Gait training;Functional mobility training;Patient/family education;Therapeutic activities;Therapeutic exercise   PT Goals (Current goals can be found in the Care Plan section) Acute Rehab PT Goals PT Goal Formulation: With patient Time For Goal Achievement: 05/13/15 Potential to Achieve Goals: Good    Frequency Min 3X/week   Barriers to discharge        Co-evaluation               End of Session   Activity Tolerance: Patient tolerated treatment well Patient left: in  chair;with chair alarm set Nurse Communication: Mobility status         Time: 3810-1751 PT Time Calculation (min) (ACUTE ONLY): 17 min   Charges:   PT Evaluation $Initial PT Evaluation Tier I: 1 Procedure     PT G Codes:        Samia Kukla,KATHrine E 05/06/2015, 3:52 PM Carmelia Bake, PT, DPT 05/06/2015 Pager: [phone]

## 2015-05-06 NOTE — Progress Notes (Signed)
[facility] PROGRESS NOTE  Manuel [name] DDU:[id number] DOB: 1941-03-09 DOA: 05/04/2015 PCP: Pcp Not In System  Assessment/Plan: Manuel [name] is a 74 y.o. male past medical history of Thyroid disease; Hyperlipemia; Liver disease; Malnutrition,  Hypothyroidism; Depression; Gout , Essential hypertension, mantel cell lymphoma status post 2R-EPOCH, In August he was found to be in complete remission and was started on rituximab every 60 days and we will addition to Revlimid. 5 days ago patient tripped and fell. He had injured his right wrist and left knee. Patietn have been treated with Revlemid fot his lymphoma this was discontinued this week because Patient have had significant diarrhea but refuses to take Imodium. He was noted to be febrile up to 101.5  Acute renal failure: related to hemodynamic, hypovolemia.  continue with IV fluids. Renal function improving.  Cr peak to 1.8. It has decreased to 1.6.   Fever/ SIRS; resolved, unclear etiology  Follow stool  culture.  C diff antigen positive. Discussed with ID , will treat empirically to see if diarrhea improved.   Influenza negative.  Chest x ray negative. Follow urine culture.  Body fluid culture from left knee no growth to date.  Blood culture pending.  Stop IV zosyn and vancomycin.   Anemia, pancytopenia;  Recent immunosuppressive therapy/  Transfusion per oncologist.   Mantle cell lymphoma ; recurrence probably, oncology consulted.  Pancytopenia; might be related to Revlimid.  Dr Lucas Mallow following.  Palliative care consulted by oncologist.  Hypokalemia: replete oral KCL supplement.   knee effusion: suspect related to recent trauma. On PE no redness notice.  culture: no organism present, synovial: negative for crystal, WBC 2444, neutrophils 80.   Prolonged QTC ; replete Mg. Repeat Mg level in am/  Elevated D dimer; doppler negative. CT angio negative for PE.    Code Status: Full Code.  Family Communication: Care  discussed with patient.  Disposition Plan: Remain inpatient.    Consultants:  Oncology  Procedures:  none  Antibiotics:  Vancomycin 10-02  Zosyn 10-02  HPI/Subjective: Feeling weak, left knee pan swelling significantly decreased.  Still with diarrhea.   Objective: Filed Vitals:   05/06/15 0500  BP: 146/56  Pulse: 76  Temp: 99.1 F (37.3 C)  Resp: 20    Intake/Output Summary (Last 24 hours) at 05/06/15 1318 Last data filed at 05/06/15 0600  Gross per 24 hour  Intake 2237.08 ml  Output      0 ml  Net 2237.08 ml   Filed Weights   05/05/15 0206 05/05/15 0805  Weight: 89.7 kg (197 lb 12 oz) 91.1 kg (200 lb 13.4 oz)    Exam:   General:  Alert in no distress  Cardiovascular: S 1, S 2 RRR  Respiratory: CTA  Abdomen: BS present, soft,.   Musculoskeletal: no edema  Data Reviewed: Basic Metabolic Panel:  Recent Labs Lab 05/04/15 1945 05/05/15 0930 05/06/15 0301  NA 136 135 137  K 2.8* 3.0* 2.9*  CL 105 110 110  CO2 19* 20* 20*  GLUCOSE 107* 103* 90  BUN 23* 20 17  CREATININE 1.83* 1.65* 1.46*  CALCIUM 8.2* 7.5* 7.7*  MG  --  1.4*  --   PHOS  --  3.0  --    Liver Function Tests:  Recent Labs Lab 05/05/15 0930  AST 25  ALT 25  ALKPHOS 60  BILITOT 1.4*  PROT 5.0*  ALBUMIN 2.6*   No results for input(s): LIPASE, AMYLASE in the last 168 hours. No results for input(s): AMMONIA in the  last 168 hours. CBC:  Recent Labs Lab 05/04/15 1945 05/05/15 0930 05/06/15 0301  WBC 4.1 3.4* 4.6  NEUTROABS 1.5* 0.6*  --   HGB 8.1* 8.2* 7.5*  HCT 24.4* 24.5* 22.8*  MCV 88.7 90.4 89.8  PLT 60* 55* 46*   Cardiac Enzymes: No results for input(s): CKTOTAL, CKMB, CKMBINDEX, TROPONINI in the last 168 hours. BNP (last 3 results) No results for input(s): BNP in the last 8760 hours.  ProBNP (last 3 results) No results for input(s): PROBNP in the last 8760 hours.  CBG: No results for input(s): GLUCAP in the last 168 hours.  Recent Results (from  the past 240 hour(s))  Body fluid culture     Status: None (Preliminary result)   Collection Time: 05/05/15 12:31 AM  Result Value Ref Range Status   Specimen Description FLUID LEFT KNEE  Final   Special Requests Normal  Final   Gram Stain   Final    NO ORGANISMS SEEN WBC PRESENT, PREDOMINANTLY PMN CYTOSPIN Gram Stain Report Called to,Read Back By and Verified With: FELICIA WILFONG,RN [id number] @ 0300 BY J SCOTTON    Culture   Final    NO GROWTH 1 DAY Performed at [facility]    Report Status PENDING  Incomplete  C difficile quick scan w PCR reflex     Status: Abnormal   Collection Time: 05/05/15 11:40 AM  Result Value Ref Range Status   C Diff antigen POSITIVE (A) NEGATIVE Final   C Diff toxin NEGATIVE NEGATIVE Final   C Diff interpretation   Final    C. difficile present, but toxin not detected. This indicates colonization. In most cases, this does not require treatment. If patient has signs and symptoms consistent with colitis, consider treatment. Requires ENTERIC precautions.     Studies: Dg Chest 2 View  05/04/2015   CLINICAL DATA:  Generalize weakness and a fall after starting chemotherapy 5 weeks ago. Fall on Thursday.  EXAM: CHEST  2 VIEW  COMPARISON:  02/10/2015.  FINDINGS: Both lateral views are degraded by patient arm position. Right sided Port-A-Cath terminates at the mid SVC. Midline trachea. Normal heart size. Atherosclerosis in the transverse aorta. No pleural effusion or pneumothorax. No congestive failure. Clear lungs.  IMPRESSION: No acute or posttraumatic deformity.  Aortic  atherosclerosis.   Electronically Signed   By: Abigail Miyamoto M.D.   On: 05/04/2015 21:25   Dg Wrist Complete Right  05/04/2015   CLINICAL DATA:  Acute onset of generalized weakness. Status post fall, with right wrist pain. Initial encounter.  EXAM: RIGHT WRIST - COMPLETE 3+ VIEW  COMPARISON:  None.  FINDINGS: There is no evidence of fracture or dislocation. The carpal rows are intact, and  demonstrate normal alignment. The joint spaces are preserved. Negative ulnar variance is noted.  No significant soft tissue abnormalities are seen.  IMPRESSION: No evidence of fracture or dislocation.   Electronically Signed   By: Garald Balding M.D.   On: 05/04/2015 21:13   Ct Angio Chest Pe W/cm &/or Wo Cm  05/04/2015   CLINICAL DATA:  Positive D-dimer. Generalized weakness. Chemotherapy for lymphoma  EXAM: CT ANGIOGRAPHY CHEST WITH CONTRAST  TECHNIQUE: Multidetector CT imaging of the chest was performed using the standard protocol during bolus administration of intravenous contrast. Multiplanar CT image reconstructions and MIPs were obtained to evaluate the vascular anatomy.  CONTRAST:  19mL OMNIPAQUE IOHEXOL 350 MG/ML SOLN  COMPARISON:  PET-CT 03/22/2015  FINDINGS: THORACIC INLET/BODY WALL:  Right IJ porta catheter remains  in good position.  MEDIASTINUM:  Normal heart size. No pericardial effusion. No acute vascular abnormality, including evidence of pulmonary embolism or aortic dissection. Atherosclerosis, including the coronary arteries.  Since PET-CT, diffuse increase in mediastinal nodal size, including prevascular, upper right peritracheal, and subcarinal nodes. Subcarinal node is now 11 mm in short axis, previously 3 mm.  LUNG WINDOWS:  No consolidation.  No effusion.  UPPER ABDOMEN:  Visible portions of the spleen are clearly larger than on PET-CT. Additionally, on the second lower acquisition, there is new retroperitoneal adenopathy with periportal node measuring up to 3 cm short axis.  OSSEOUS:  No acute fracture. No suspicious lytic or blastic lesions. Stable endplate erosive changes in the lower thoracic spine.  Review of the MIP images confirms the above findings.  IMPRESSION: 1. Negative for pulmonary embolism or other acute finding. 2. Lymphoma with enlarging mediastinal and upper abdominal lymph nodes since PET-CT 03/22/2015. Please arrange follow-up with patient's oncologist.   Electronically  Signed   By: Monte Fantasia M.D.   On: 05/04/2015 23:04   Dg Knee Complete 4 Views Left  05/04/2015   CLINICAL DATA:  Acute onset of generalized weakness. Status post fall, with left knee pain. Initial encounter.  EXAM: LEFT KNEE - COMPLETE 4+ VIEW  COMPARISON:  None.  FINDINGS: There is no evidence of fracture or dislocation. The joint spaces are preserved. No significant degenerative change is seen; the patellofemoral joint is grossly unremarkable in appearance.  A moderate knee joint effusion is noted. Chondrocalcinosis is noted. An apparent soft tissue calcification is noted medially and posteriorly at the level of the distal femur.  IMPRESSION: 1. No evidence of fracture or dislocation. 2. Moderate knee joint effusion noted. 3. Chondrocalcinosis noted.   Electronically Signed   By: Garald Balding M.D.   On: 05/04/2015 21:15    Scheduled Meds: . acyclovir  400 mg Oral Daily  . antiseptic oral rinse  7 mL Mouth Rinse q12n4p  . chlorhexidine  15 mL Mouth Rinse BID  . feeding supplement (ENSURE ENLIVE)  237 mL Oral BID BM  . levothyroxine  75 mcg Oral Daily  . loratadine  10 mg Oral Daily  . metoCLOPramide (REGLAN) injection  5 mg Intravenous 4 times per day  . sodium chloride  3 mL Intravenous Q12H   Continuous Infusions: . sodium chloride 75 mL/hr at 05/06/15 1225    Active Problems:   Mantle cell lymphoma (HCC)   Thrombocytopenia (HCC)   Essential hypertension   Fever   Hypokalemia   Dehydration   Acute renal failure (ARF) (HCC)   SIRS (systemic inflammatory response syndrome) (HCC)   QT prolongation    Time spent: 35 minutes.     Niel Hummer A  [facility] Pager [phone]. If 7PM-7AM, please contact night-coverage at [email or web address], password [facility] 05/06/2015, 1:18 PM  LOS: 1 day

## 2015-05-06 NOTE — Progress Notes (Signed)
VASCULAR LAB PRELIMINARY  PRELIMINARY  PRELIMINARY  PRELIMINARY  Bilateral lower extremity venous duplex  completed.    Preliminary report:  Bilateral:  No evidence of DVT, superficial thrombosis, or Baker's Cyst.    Lior Hoen, RVT 05/06/2015, 11:07 AM

## 2015-05-07 ENCOUNTER — Other Ambulatory Visit: Payer: Self-pay | Admitting: Hematology and Oncology

## 2015-05-07 DIAGNOSIS — R110 Nausea: Secondary | ICD-10-CM

## 2015-05-07 DIAGNOSIS — C8313 Mantle cell lymphoma, intra-abdominal lymph nodes: Secondary | ICD-10-CM

## 2015-05-07 DIAGNOSIS — R531 Weakness: Secondary | ICD-10-CM | POA: Insufficient documentation

## 2015-05-07 DIAGNOSIS — D649 Anemia, unspecified: Secondary | ICD-10-CM | POA: Insufficient documentation

## 2015-05-07 LAB — BASIC METABOLIC PANEL
ANION GAP: 7 (ref 5–15)
BUN: 12 mg/dL (ref 6–20)
CO2: 20 mmol/L — ABNORMAL LOW (ref 22–32)
Calcium: 7.7 mg/dL — ABNORMAL LOW (ref 8.9–10.3)
Chloride: 114 mmol/L — ABNORMAL HIGH (ref 101–111)
Creatinine, Ser: 1.22 mg/dL (ref 0.61–1.24)
GFR, EST NON AFRICAN AMERICAN: 57 mL/min — AB (ref >60)
GLUCOSE: 95 mg/dL (ref 65–99)
POTASSIUM: 3.1 mmol/L — AB (ref 3.5–5.1)
Sodium: 141 mmol/L (ref 135–145)

## 2015-05-07 LAB — CBC
HEMATOCRIT: 22.2 % — AB (ref 39.0–52.0)
Hemoglobin: 7.3 g/dL — ABNORMAL LOW (ref 13.0–17.0)
MCH: 30 pg (ref 26.0–34.0)
MCHC: 32.9 g/dL (ref 30.0–36.0)
MCV: 91.4 fL (ref 78.0–100.0)
PLATELETS: 45 10*3/uL — AB (ref 150–400)
RBC: 2.43 MIL/uL — AB (ref 4.22–5.81)
RDW: 15.8 % — ABNORMAL HIGH (ref 11.5–15.5)
WBC: 5.8 10*3/uL (ref 4.0–10.5)

## 2015-05-07 LAB — FECAL LACTOFERRIN, QUANT: FECAL LACTOFERRIN: POSITIVE

## 2015-05-07 LAB — MAGNESIUM: MAGNESIUM: 1.6 mg/dL — AB (ref 1.7–2.4)

## 2015-05-07 MED ORDER — OLANZAPINE 5 MG PO TABS
5.0000 mg | ORAL_TABLET | Freq: Every day | ORAL | Status: DC
  Administered 2015-05-07 – 2015-05-08 (×2): 5 mg via ORAL
  Filled 2015-05-07 (×3): qty 1

## 2015-05-07 MED ORDER — POTASSIUM CHLORIDE CRYS ER 20 MEQ PO TBCR
40.0000 meq | EXTENDED_RELEASE_TABLET | Freq: Once | ORAL | Status: AC
  Administered 2015-05-07: 40 meq via ORAL
  Filled 2015-05-07: qty 2

## 2015-05-07 MED ORDER — MAGNESIUM SULFATE 2 GM/50ML IV SOLN
2.0000 g | Freq: Once | INTRAVENOUS | Status: AC
  Administered 2015-05-07: 2 g via INTRAVENOUS
  Filled 2015-05-07: qty 50

## 2015-05-07 NOTE — Consult Note (Signed)
Consultation Note Date: 05/07/2015   Patient Name: Manuel [name]  DOB: Mar 15, 1941  MRN: [id number]  Age / Sex: 74 y.o., male   PCP: Pcp Not In System Referring Physician: Elmarie Shiley, MD  Reason for Consultation: Disposition, Establishing goals of care and Non pain symptom management  Palliative Care Assessment and Plan Summary of Established Goals of Care and Medical Treatment Preferences   Clinical Assessment/Narrative: Manuel [name] is a 74 year old male with past medical history of mantle cell lymphoma.  His disease has relapsed and he has poor prognosis. His case was reviewed with tumor board and no good options for continued disease modifying therapy.  He met with Dr. Alvy Bimler today and is open to discussing hospice.  I met with Manuel [name] and his daughter. He was awake and alert for most the conversation but deferred to his daughter saying, "do whatever. It is what it is. I'm going to die soon."  We talked about goals of being comfortable, being outside the hospital, and spending as much quality time as possible with family.  We discussed that the fact he is no longer a candidate for disease modifying therapy does not mean that there is not care left offered. I told him that moving forward his care should be focused on helping him to feel as low as possible for as long as possible. We talked about hospice philosophy and his daughter was very open to this idea. We also discussed the specifics of what hospice candidate cannot provide in his care.  The plan is for him to go to his daughter's house in [place]. She is has already been in contact with an agency they have used in the past to hire help as needed to ensure that he has caregivers available 24 hours a day as needed.  They were both in agreement with referral to case management to present options for home hospice.  We also discussed Manuel [name] symptoms. He also remained vague with these but reports his biggest concern is  the fact that he is having a mix of nausea and poor appetite. He also reports very poor sleep whenever he is in the hospital. He reports that his pain is currently well controlled. He reports it is 0 out of 10. He states that in the past he had some abdominal pain and it improves oxycodone on as-needed basis. He also endorses having some shortness of breath is very positional. He states he has difficulty lying completely flat whenever he has slight elevation the dyspnea goes away.  Contacts/Participants in Discussion: Primary Decision Maker: Patient but he defers to his daughter HCPOA: None on chart   Code Status/Advance Care Planning:  DO NOT RESUSCITATE  Symptom Management:   Nausea: Reports this is somewhat improved with Reglan.  Pain: Denies currently. Reports she did have some pain in the past that responded well to oxycodone. Will continue oxycodone as needed  Poor appetite: Patient reports is his major concern. Will plan for addition of Zyprexa to see if this improved appetite. He also reports poor sleep and hopefully Zyprexa can help this as well.  Insomnia: Plan for addition of Zyprexa  Diarrhea: Patient reports continuous watery stool 1-2 times per day. I discussed with him history of bowel movements to ensure it does not sound like an impaction. He reports he is having good bowel movements prior to this and was noted to be C. difficile positive (possible colonization). He has refused Imodium in the past. Continue treatments for C. difficile while  following cultures.  Dyspnea: Reports this only occurs when he lies completely flat. Likely positional related to his disease. I also discussed with him that oxycodone can be used on as-needed basis for shortness of breath. He denies that he would ever use of his purposes is not become that bad and he simply rolls over and dyspnea improved.  Psycho-social/Spiritual:   Support System: Strong through daughter  Desire for further  Chaplaincy support:no  Prognosis: < 6 months  Discharge Planning:  Home with Hospice       Chief Complaint/History of Present Illness:  74 year old male with mantle cell lymphoma. Palliative consulted for goals of care/discharge planning    Primary Diagnoses  Present on Admission:  . Essential hypertension . Fever . Mantle cell lymphoma ([place]) . Thrombocytopenia ([place]) . Hypokalemia . Dehydration . Acute renal failure (ARF) ([place]) . SIRS (systemic inflammatory response syndrome) (HCC) . QT prolongation  Palliative Review of Systems: Patient reports nausea, poor appetite, and insomnia I have reviewed the medical record, interviewed the patient and family, and examined the patient. The following aspects are pertinent.  Past Medical History  Diagnosis Date  . Thyroid disease   . Hyperlipemia   . Liver disease   . Malnutrition ([place]) 08/30/2013  . Hypothyroidism   . Depression     Spouse passed 5 months ago  . Gout 09/28/2013  . Fatigue 08/31/2014  . Essential hypertension 10/25/2014  . 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 Marital Status: Widowed    Spouse Name: N/A  . Number of Children: N/A  . Years of Education: N/A   Social History Main Topics  . Smoking status: Former Smoker -- 0.25 packs/day for 52 years    Types: Cigarettes  . Smokeless tobacco: Never Used  . Alcohol Use: No  . Drug Use: No  . Sexual Activity: Not Asked   Other Topics Concern  . None   Social History Narrative   Family History  Problem Relation Age of Onset  . Cancer Mother     breast ca  . Cancer Sister     NHL  . Cancer Brother     ?colon can  . Cancer Brother     lung ca   Scheduled Meds: . acyclovir  400 mg Oral Daily  . antiseptic oral rinse  7 mL Mouth Rinse q12n4p  . chlorhexidine  15 mL Mouth Rinse BID  . feeding supplement (ENSURE ENLIVE)  237 mL Oral BID BM  . levothyroxine  75 mcg Oral Daily  .  loratadine  10 mg Oral Daily  . metoCLOPramide (REGLAN) injection  5 mg Intravenous 4 times per day  . metroNIDAZOLE  500 mg Oral 3 times per day  . OLANZapine  5 mg Oral QHS  . sodium chloride  3 mL Intravenous Q12H   Continuous Infusions: . sodium chloride 75 mL/hr (05/07/15 1303)   PRN Meds:.acetaminophen **OR** acetaminophen, morphine injection, oxyCODONE, sodium chloride Medications Prior to Admission:  Prior to Admission medications   Medication Sig Start Date End Date Taking? Authorizing Provider  acetaminophen (TYLENOL) 500 MG tablet Take 500 mg by mouth every 6 (six) hours as needed for moderate pain or fever.    Yes Historical Provider, MD  acyclovir (ZOVIRAX) 400 MG tablet Take 1 tablet (400 mg total) by mouth daily. 04/17/15  Yes Heath Lark, MD  levothyroxine (SYNTHROID, LEVOTHROID) 75 MCG tablet Take 1 tablet (75 mcg total) by mouth  daily. 04/17/15  Yes Heath Lark, MD  loratadine (CLARITIN) 10 MG tablet Take 10 mg by mouth every morning.   Yes Historical Provider, MD  ondansetron (ZOFRAN) 8 MG tablet Take 1 tablet (8 mg total) by mouth 3 (three) times daily. 03/08/15  Yes Heath Lark, MD  oxyCODONE (ROXICODONE) 5 MG immediate release tablet Take 1 tablet (5 mg total) by mouth every 4 (four) hours as needed for severe pain. 03/04/15  Yes Heath Lark, MD  prochlorperazine (COMPAZINE) 10 MG tablet Take 10 mg by mouth every 6 (six) hours as needed for nausea or vomiting.  03/08/15  Yes Historical Provider, MD  lenalidomide (REVLIMID) 15 MG capsule Take 1 capsule (15 mg total) by mouth daily. Patient not taking: Reported on 05/04/2015 04/19/15   Heath Lark, MD   No Known Allergies CBC:    Component Value Date/Time   WBC 5.8 05/07/2015 0406   WBC 7.3 03/25/2015 0830   HGB 7.3* 05/07/2015 0406   HGB 11.1* 03/25/2015 0830   HCT 22.2* 05/07/2015 0406   HCT 32.6* 03/25/2015 0830   PLT 45* 05/07/2015 0406   PLT 312 03/25/2015 0830   MCV 91.4 05/07/2015 0406   MCV 91.4 03/25/2015 0830    NEUTROABS 0.6* 05/05/2015 0930   NEUTROABS 5.5 03/25/2015 0830   LYMPHSABS 1.5 05/05/2015 0930   LYMPHSABS 0.5* 03/25/2015 0830   MONOABS 1.0 05/05/2015 0930   MONOABS 0.9 03/25/2015 0830   EOSABS 0.2 05/05/2015 0930   EOSABS 0.2 03/25/2015 0830   BASOSABS 0.1 05/05/2015 0930   BASOSABS 0.1 03/25/2015 0830   Comprehensive Metabolic Panel:    Component Value Date/Time   NA 141 05/07/2015 0406   NA 145 03/25/2015 0830   K 3.1* 05/07/2015 0406   K 4.4 03/25/2015 0830   CL 114* 05/07/2015 0406   CO2 20* 05/07/2015 0406   CO2 25 03/25/2015 0830   BUN 12 05/07/2015 0406   BUN 12.8 03/25/2015 0830   CREATININE 1.22 05/07/2015 0406   CREATININE 1.3 03/25/2015 0830   GLUCOSE 95 05/07/2015 0406   GLUCOSE 111 03/25/2015 0830   CALCIUM 7.7* 05/07/2015 0406   CALCIUM 9.4 03/25/2015 0830   AST 25 05/05/2015 0930   AST 14 03/25/2015 0830   ALT 25 05/05/2015 0930   ALT 16 03/25/2015 0830   ALKPHOS 60 05/05/2015 0930   ALKPHOS 74 03/25/2015 0830   BILITOT 1.4* 05/05/2015 0930   BILITOT 0.32 03/25/2015 0830   PROT 5.0* 05/05/2015 0930   PROT 6.1* 03/25/2015 0830   ALBUMIN 2.6* 05/05/2015 0930   ALBUMIN 3.6 03/25/2015 0830    Physical Exam: Vital Signs: BP 133/64 mmHg  Pulse 81  Temp(Src) 98.7 F (37.1 C) (Oral)  Resp 20  Ht _0  (1.905 m)  Wt 91.1 kg (200 lb 13.4 oz)  BMI 25.10 kg/m2  SpO2 98% SpO2: SpO2: 98 % O2 Device: O2 Device: Not Delivered O2 Flow Rate: O2 Flow Rate (L/min): 2 L/min Intake/output summary:  Intake/Output Summary (Last 24 hours) at 05/07/15 1935 Last data filed at 05/07/15 0930  Gross per 24 hour  Intake 678.75 ml  Output      0 ml  Net 678.75 ml   LBM: Last BM Date: 05/06/15 Baseline Weight: Weight: 89.7 kg (197 lb 12 oz) Most recent weight: Weight: 91.1 kg (200 lb 13.4 oz)  Exam Findings:  General: Alert, awake, in no acute distress.  HEENT: No bruits, no goiter, no JVD Heart: Regular rate and rhythm. No murmur appreciated. Lungs: Good  air movement, clear Abdomen: Soft, nontender, nondistended, positive bowel sounds.  Ext: No significant edema Skin: Warm and dry Neuro: Grossly intact, nonfocal.          Palliative Performance Scale 60 **              Additional Data Reviewed: Recent Labs     05/06/15  0301  05/07/15  0406  WBC  4.6  5.8  HGB  7.5*  7.3*  PLT  46*  45*  NA  137  141  BUN  17  12  CREATININE  1.46*  1.22     Time In: 1740 Time Out: 1855  Time Total: 85 Greater than 50%  of this time was spent counseling and coordinating care related to the above assessment and plan.  Signed by: Micheline Rough, MD  Micheline Rough, MD  05/07/2015, 7:35 PM  Please contact Palliative Medicine Team phone at [phone] for questions and concerns.

## 2015-05-07 NOTE — Progress Notes (Signed)
[facility] PROGRESS NOTE  Manuel [name] BTD:[id number] DOB: 01/27/1941 DOA: 05/04/2015 PCP: Pcp Not In System  Assessment/Plan: Manuel [name] is a 74 y.o. male past medical history of Thyroid disease; Hyperlipemia; Liver disease; Malnutrition,  Hypothyroidism; Depression; Gout , Essential hypertension, mantel cell lymphoma status post 2R-EPOCH, In August he was found to be in complete remission and was started on rituximab every 60 days and we will addition to Revlimid. 5 days ago patient tripped and fell. He had injured his right wrist and left knee. Patietn have been treated with Revlemid fot his lymphoma this was discontinued this week because Patient have had significant diarrhea but refuses to take Imodium. He was noted to be febrile up to 101.5  Acute renal failure: related to hemodynamic, hypovolemia.  continue with IV fluids. Renal function improving.  Cr peak to 1.8. It has decreased to 1.2.   Fever/ SIRS; resolved, unclear etiology  Follow stool  culture.  C diff antigen positive. Discussed with ID , will treat empirically to see if diarrhea improved.  Influenza negative.  Chest x ray negative. urine culture no growth to date  Body fluid culture from left knee no growth to date.  Blood culture no growth to date.  Stopped  IV zosyn and vancomycin 10-03  Anemia, pancytopenia;  Recent immunosuppressive therapy/  Transfusion per oncologist.   Mantle cell lymphoma ; recurrence probably, oncology consulted.  Pancytopenia; might be related to Revlimid.  Dr Lucas Mallow following.  Palliative care consulted by oncologist.  Hypokalemia: replete oral KCL supplement.   knee effusion: suspect related to recent trauma. On PE no redness notice.   culture: no organism present, synovial: negative for crystal, WBC 2444, neutrophils 80.   Prolonged QTC ; replete Mg. Replaced Mg IV.   Elevated D dimer; doppler negative. CT angio negative for PE.    Code Status: Full Code.   Family Communication: Care discussed with patient.  Disposition Plan: Remain inpatient.    Consultants:  Oncology  Procedures:  none  Antibiotics:  Vancomycin 10-02  Zosyn 10-02  HPI/Subjective: Feeling weak, diarrhea the same  Objective: Filed Vitals:   05/07/15 1339  BP: 133/64  Pulse: 81  Temp: 98.7 F (37.1 C)  Resp: 20    Intake/Output Summary (Last 24 hours) at 05/07/15 1511 Last data filed at 05/07/15 0930  Gross per 24 hour  Intake    960 ml  Output      0 ml  Net    960 ml   Filed Weights   05/05/15 0206 05/05/15 0805  Weight: 89.7 kg (197 lb 12 oz) 91.1 kg (200 lb 13.4 oz)    Exam:   General:  Alert in no distress  Cardiovascular: S 1, S 2 RRR  Respiratory: CTA  Abdomen: BS present, soft,.   Musculoskeletal: no edema  Data Reviewed: Basic Metabolic Panel:  Recent Labs Lab 05/04/15 1945 05/05/15 0930 05/06/15 0301 05/07/15 0406  NA 136 135 137 141  K 2.8* 3.0* 2.9* 3.1*  CL 105 110 110 114*  CO2 19* 20* 20* 20*  GLUCOSE 107* 103* 90 95  BUN 23* 20 17 12   CREATININE 1.83* 1.65* 1.46* 1.22  CALCIUM 8.2* 7.5* 7.7* 7.7*  MG  --  1.4*  --  1.6*  PHOS  --  3.0  --   --    Liver Function Tests:  Recent Labs Lab 05/05/15 0930  AST 25  ALT 25  ALKPHOS 60  BILITOT 1.4*  PROT 5.0*  ALBUMIN 2.6*  No results for input(s): LIPASE, AMYLASE in the last 168 hours. No results for input(s): AMMONIA in the last 168 hours. CBC:  Recent Labs Lab 05/04/15 1945 05/05/15 0930 05/06/15 0301 05/07/15 0406  WBC 4.1 3.4* 4.6 5.8  NEUTROABS 1.5* 0.6*  --   --   HGB 8.1* 8.2* 7.5* 7.3*  HCT 24.4* 24.5* 22.8* 22.2*  MCV 88.7 90.4 89.8 91.4  PLT 60* 55* 46* 45*   Cardiac Enzymes: No results for input(s): CKTOTAL, CKMB, CKMBINDEX, TROPONINI in the last 168 hours. BNP (last 3 results) No results for input(s): BNP in the last 8760 hours.  ProBNP (last 3 results) No results for input(s): PROBNP in the last 8760 hours.  CBG: No  results for input(s): GLUCAP in the last 168 hours.  Recent Results (from the past 240 hour(s))  Body fluid culture     Status: None (Preliminary result)   Collection Time: 05/05/15 12:31 AM  Result Value Ref Range Status   Specimen Description FLUID LEFT KNEE  Final   Special Requests Normal  Final   Gram Stain   Final    NO ORGANISMS SEEN WBC PRESENT, PREDOMINANTLY PMN CYTOSPIN Gram Stain Report Called to,Read Back By and Verified With: FELICIA WILFONG,RN [id number] @ 0300 BY J SCOTTON    Culture   Final    NO GROWTH 2 DAYS Performed at [facility]    Report Status PENDING  Incomplete  Stool culture     Status: None (Preliminary result)   Collection Time: 05/05/15 11:40 AM  Result Value Ref Range Status   Specimen Description URINE, CLEAN CATCH  Final   Special Requests NONE  Final   Culture   Final    NO SUSPICIOUS COLONIES, CONTINUING TO HOLD Performed at [facility]    Report Status PENDING  Incomplete  C difficile quick scan w PCR reflex     Status: Abnormal   Collection Time: 05/05/15 11:40 AM  Result Value Ref Range Status   C Diff antigen POSITIVE (A) NEGATIVE Final   C Diff toxin NEGATIVE NEGATIVE Final   C Diff interpretation   Final    C. difficile present, but toxin not detected. This indicates colonization. In most cases, this does not require treatment. If patient has signs and symptoms consistent with colitis, consider treatment. Requires ENTERIC precautions.  Urine culture     Status: None   Collection Time: 05/05/15 11:40 AM  Result Value Ref Range Status   Specimen Description URINE, CLEAN CATCH  Final   Special Requests NONE  Final   Culture   Final    NO GROWTH 1 DAY Performed at [facility]    Report Status 05/06/2015 FINAL  Final  Culture, blood (routine x 2)     Status: None (Preliminary result)   Collection Time: 05/05/15  4:30 PM  Result Value Ref Range Status   Specimen Description BLOOD PORT  10 ML IN [facility] BOTTLE  Final    Special Requests NONE  Final   Culture   Final    NO GROWTH 2 DAYS Performed at [facility]    Report Status PENDING  Incomplete  Culture, blood (routine x 2)     Status: None (Preliminary result)   Collection Time: 05/05/15  4:52 PM  Result Value Ref Range Status   Specimen Description BLOOD LEFT ARM  10 ML IN [facility] BOTTLE  Final   Special Requests NONE  Final   Culture   Final  NO GROWTH 2 DAYS Performed at [facility]    Report Status PENDING  Incomplete     Studies: No results found.  Scheduled Meds: . acyclovir  400 mg Oral Daily  . antiseptic oral rinse  7 mL Mouth Rinse q12n4p  . chlorhexidine  15 mL Mouth Rinse BID  . feeding supplement (ENSURE ENLIVE)  237 mL Oral BID BM  . levothyroxine  75 mcg Oral Daily  . loratadine  10 mg Oral Daily  . metoCLOPramide (REGLAN) injection  5 mg Intravenous 4 times per day  . metroNIDAZOLE  500 mg Oral 3 times per day  . sodium chloride  3 mL Intravenous Q12H   Continuous Infusions: . sodium chloride 75 mL/hr (05/07/15 1303)    Principal Problem:   Acute renal failure (ARF) (HCC) Active Problems:   Mantle cell lymphoma (HCC)   Thrombocytopenia (HCC)   Essential hypertension   Fever   Hypokalemia   Dehydration   SIRS (systemic inflammatory response syndrome) (HCC)   QT prolongation    Time spent: 35 minutes.     Niel Hummer A  [facility] Pager [phone]. If 7PM-7AM, please contact night-coverage at [email or web address], password [facility] 05/07/2015, 3:11 PM  LOS: 2 days

## 2015-05-07 NOTE — Care Management Important Message (Signed)
Important Message  Patient Details IM Letter given to Cookie/ Case Manager to present to Patient.Important Message  Patient Details  Name: Manuel [name] MRN: [id number] Date of Birth: April 23, 1941   Medicare Important Message Given:  Yes-second notification given    Camillo Flaming 05/07/2015, 12:41 PM Name: Manuel [name] MRN: [id number] Date of Birth: May 06, 1941   Medicare Important Message Given:  Yes-second notification given    Camillo Flaming 05/07/2015, 12:40 PM

## 2015-05-07 NOTE — Progress Notes (Signed)
Manuel [name]   DOB:1940-09-04   TG#:[id number]    Subjective: The patient continues to do poorly. He has no appetite and continues to have very mild loose stool twice today. He denies crampy abdominal pain. He continues to have nausea but no vomiting. He continues to have intermittent left shoulder pain.  Objective:  Filed Vitals:   05/07/15 1339  BP: 133/64  Pulse: 81  Temp: 98.7 F (37.1 C)  Resp: 20     Intake/Output Summary (Last 24 hours) at 05/07/15 1648 Last data filed at 05/07/15 0930  Gross per 24 hour  Intake    960 ml  Output      0 ml  Net    960 ml    GENERAL:alert, no distress and comfortable SKIN: skin color, texture, turgor are normal, no rashes or significant lesions EYES: normal, Conjunctiva are pale and non-injected, sclera clear Musculoskeletal:no cyanosis of digits and no clubbing  NEURO: alert & oriented x 3 with fluent speech, no focal motor/sensory deficits   Labs:  Lab Results  Component Value Date   WBC 5.8 05/07/2015   HGB 7.3* 05/07/2015   HCT 22.2* 05/07/2015   MCV 91.4 05/07/2015   PLT 45* 05/07/2015   NEUTROABS 0.6* 05/05/2015    Lab Results  Component Value Date   NA 141 05/07/2015   K 3.1* 05/07/2015   CL 114* 05/07/2015   CO2 20* 05/07/2015   Assessment & Plan:  Recurrent mantle cell lymphoma Imaging study and examination revealed palpable splenomegaly, consistent with disease relapse. I have discontinued Revlimid. The patient is made aware multiple times that his prognosis is poor. I review his case at the hematology tumor board today and treatment options are really limited. I spoke with the patient again for consideration for palliative care and hospice. After much discussion, he is willing to consider stopping treatment. He is interested to meet with social worker/palliative care to consider hospice. I have discussed this with his MPOA, his daughter and will touch base again with her tomorrow.  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 The patient denies recent history of bleeding such as epistaxis, hematuria or hematochezia. He is asymptomatic from the anemia. I will observe for now.  Consider transfusion if hemoglobin drops further  Thrombocytopenia and splenomegaly, due to recurrence of mantle cell lymphoma This is due to disease. Discontinue pharmacology DVT prophylaxis with blood count less than 50,000 due to risk of bleeding  Diarrhea, C. difficile toxin negative, C. difficile antigen positive, likely colonization Consider stopping antibiotics treatment if cultures were negative   Prerenal acute renal failure, improving Continue IV fluid hydration  Hypokalemia likely secondary to poor oral intake, diarrhea and antibiotics side effects He is on potassium replacement therapy  Protein calorie malnutrition I appreciate nutritional evaluation and consult.  Discharge planning Not ready for discharge today due to unresolved diarrhea  Possibly discharge to home hospice over the next 24-48 hours  Left shoulder pain This is due to referred pain from splenomegaly. Continue oxycodone as needed.   Palliative care encounter I discussed CODE STATUS with the patient. He is in agreement to change code status to DO NOT RESUSCITATE/DO NOT INTUBATE Daughter is the MPOA I recommend palliative care consult to address chronic pain management and goals of care with the patient  [facility], [place], MD 05/07/2015  4:48 PM

## 2015-05-08 ENCOUNTER — Other Ambulatory Visit: Payer: Self-pay | Admitting: Hematology and Oncology

## 2015-05-08 DIAGNOSIS — R6510 Systemic inflammatory response syndrome (SIRS) of non-infectious origin without acute organ dysfunction: Secondary | ICD-10-CM

## 2015-05-08 DIAGNOSIS — R197 Diarrhea, unspecified: Secondary | ICD-10-CM | POA: Insufficient documentation

## 2015-05-08 DIAGNOSIS — N178 Other acute kidney failure: Secondary | ICD-10-CM

## 2015-05-08 DIAGNOSIS — R531 Weakness: Secondary | ICD-10-CM

## 2015-05-08 DIAGNOSIS — D649 Anemia, unspecified: Secondary | ICD-10-CM

## 2015-05-08 LAB — STOOL CULTURE

## 2015-05-08 LAB — BODY FLUID CULTURE
CULTURE: NO GROWTH
GRAM STAIN: NONE SEEN
SPECIAL REQUESTS: NORMAL

## 2015-05-08 LAB — CBC
HCT: 21.2 % — ABNORMAL LOW (ref 39.0–52.0)
Hemoglobin: 7 g/dL — ABNORMAL LOW (ref 13.0–17.0)
MCH: 30.2 pg (ref 26.0–34.0)
MCHC: 33 g/dL (ref 30.0–36.0)
MCV: 91.4 fL (ref 78.0–100.0)
PLATELETS: 39 10*3/uL — AB (ref 150–400)
RBC: 2.32 MIL/uL — ABNORMAL LOW (ref 4.22–5.81)
RDW: 16.1 % — AB (ref 11.5–15.5)
WBC: 6.8 10*3/uL (ref 4.0–10.5)

## 2015-05-08 LAB — BASIC METABOLIC PANEL
ANION GAP: 7 (ref 5–15)
BUN: 10 mg/dL (ref 6–20)
CALCIUM: 8 mg/dL — AB (ref 8.9–10.3)
CO2: 19 mmol/L — ABNORMAL LOW (ref 22–32)
CREATININE: 1.14 mg/dL (ref 0.61–1.24)
Chloride: 116 mmol/L — ABNORMAL HIGH (ref 101–111)
Glucose, Bld: 84 mg/dL (ref 65–99)
Potassium: 3.5 mmol/L (ref 3.5–5.1)
SODIUM: 142 mmol/L (ref 135–145)

## 2015-05-08 LAB — MAGNESIUM: MAGNESIUM: 1.8 mg/dL (ref 1.7–2.4)

## 2015-05-08 MED ORDER — SACCHAROMYCES BOULARDII 250 MG PO CAPS
250.0000 mg | ORAL_CAPSULE | Freq: Two times a day (BID) | ORAL | Status: DC
  Administered 2015-05-08 – 2015-05-09 (×3): 250 mg via ORAL
  Filled 2015-05-08 (×3): qty 1

## 2015-05-08 MED ORDER — METOCLOPRAMIDE HCL 10 MG PO TABS: 5.0000 mg | ORAL_TABLET | Freq: Three times a day (TID) | ORAL | Status: DC | PRN

## 2015-05-08 MED ORDER — INFLUENZA VAC SPLIT QUAD 0.5 ML IM SUSY
0.5000 mL | PREFILLED_SYRINGE | INTRAMUSCULAR | Status: DC
  Filled 2015-05-08 (×2): qty 0.5

## 2015-05-08 NOTE — Progress Notes (Signed)
PT Cancellation Note  Patient Details Name: Manuel [name] MRN: [id number] DOB: 05-May-1941   Cancelled Treatment:    Reason Eval/Treat Not Completed: Fatigue/lethargy limiting ability to participate (pt stated he's tired and wants to sleep right now. Will follow. )   Philomena Doheny 05/08/2015, 9:31 AM

## 2015-05-08 NOTE — Progress Notes (Signed)
[facility] PROGRESS NOTE  Zair Borawski CHE:[id number] DOB: [birth year] DOA: 05/04/2015 PCP: Pcp Not In System  Assessment/Plan: Acute renal failure: related to hemodynamic, hypovolemia.  continue with IV fluids. Renal function improving.  Cr peak to 1.8. It has decreased to 1.2 with IVF's and supportive care Advise to maintain hydration  Fever/ SIRS; resolved, unclear etiology  C diff antigen positive. Discussed with ID , will treat empirically to see if diarrhea improved with flagyl and florastor.  Influenza negative.  Chest x ray negative. urine culture no growth to date  Body fluid culture from left knee no growth to date.  Blood culture no growth to date.  Stopped  IV zosyn and vancomycin 10-03  Anemia, pancytopenia;  Recent immunosuppressive therapy No further Transfusion per oncologist.   Mantle cell lymphoma ; recurrence probably, oncology consulted.  Pancytopenia; might be related to Revlimid.  Dr Lucas Mallow following.  Palliative care consulted by oncologist and plan is for full comfort with home with hospice   Hypokalemia: replete oral KCL with daily supplementation.   knee effusion: suspect related to recent trauma. On PE no redness notice. culture: no organism present, synovial: negative for crystal, WBC 2444, neutrophils 80.   Prolonged QTC ; Replaced Mg IV.  Will d/c telemetry Plan is for comfort care and hospice therapy (patient feeling uncomfortable with telemetry boX)  Elevated D dimer; doppler negative. CT angio negative for PE.   Poor appetite and insomnia: started on zyprexa  Nausea/Pain: as part of palliative care intervention will use reglan and oxycodone PRN   Code Status: Full Code.  Family Communication: Care discussed with patient.  Disposition Plan: Remain inpatient.    Consultants:  Oncology  Procedures:  none  Antibiotics:  Vancomycin 10-02>>10/3  Zosyn 10-02>>10/03  Flagyl 10/04  HPI/Subjective: Feeling weak and w/o  appetite; diarrhea slowly improving but still loose. No fever, no nausea, no vomiting   Objective: Filed Vitals:   05/08/15 1443  BP: 133/65  Pulse: 89  Temp: 97.5 F (36.4 C)  Resp: 20    Intake/Output Summary (Last 24 hours) at 05/08/15 1903 Last data filed at 05/08/15 1800  Gross per 24 hour  Intake   3325 ml  Output      0 ml  Net   3325 ml   Filed Weights   05/05/15 0206 05/05/15 0805  Weight: 89.7 kg (197 lb 12 oz) 91.1 kg (200 lb 13.4 oz)    Exam:   General:  Alert in no distress; denies abd pain and reported diarrhea improving. Still with very poor appetite   Cardiovascular: S 1, S 2 RRR  Respiratory: CTA  Abdomen: BS present, soft,.   Musculoskeletal: no edema  Data Reviewed: Basic Metabolic Panel:  Recent Labs Lab 05/04/15 1945 05/05/15 0930 05/06/15 0301 05/07/15 0406 05/08/15 0405  NA 136 135 137 141 142  K 2.8* 3.0* 2.9* 3.1* 3.5  CL 105 110 110 114* 116*  CO2 19* 20* 20* 20* 19*  GLUCOSE 107* 103* 90 95 84  BUN 23* 20 17 12 10   CREATININE 1.83* 1.65* 1.46* 1.22 1.14  CALCIUM 8.2* 7.5* 7.7* 7.7* 8.0*  MG  --  1.4*  --  1.6* 1.8  PHOS  --  3.0  --   --   --    Liver Function Tests:  Recent Labs Lab 05/05/15 0930  AST 25  ALT 25  ALKPHOS 60  BILITOT 1.4*  PROT 5.0*  ALBUMIN 2.6*   CBC:  Recent Labs Lab 05/04/15 1945 05/05/15  0930 05/06/15 0301 05/07/15 0406 05/08/15 0405  WBC 4.1 3.4* 4.6 5.8 6.8  NEUTROABS 1.5* 0.6*  --   --   --   HGB 8.1* 8.2* 7.5* 7.3* 7.0*  HCT 24.4* 24.5* 22.8* 22.2* 21.2*  MCV 88.7 90.4 89.8 91.4 91.4  PLT 60* 55* 46* 45* 39*   Cardiac Enzymes: No results for input(s): CKTOTAL, CKMB, CKMBINDEX, TROPONINI in the last 168 hours. BNP (last 3 results) No results for input(s): BNP in the last 8760 hours.  ProBNP (last 3 results) No results for input(s): PROBNP in the last 8760 hours.  CBG: No results for input(s): GLUCAP in the last 168 hours.  Recent Results (from the past 240 hour(s))   Body fluid culture     Status: None   Collection Time: 05/05/15 12:31 AM  Result Value Ref Range Status   Specimen Description FLUID LEFT KNEE  Final   Special Requests Normal  Final   Gram Stain   Final    NO ORGANISMS SEEN WBC PRESENT, PREDOMINANTLY PMN CYTOSPIN Gram Stain Report Called to,Read Back By and Verified With: FELICIA WILFONG,RN [id number] @ 0300 BY J SCOTTON    Culture   Final    NO GROWTH 3 DAYS Performed at [facility]    Report Status 05/08/2015 FINAL  Final  Stool culture     Status: None   Collection Time: 05/05/15 11:40 AM  Result Value Ref Range Status   Specimen Description URINE, CLEAN CATCH  Final   Special Requests NONE  Final   Culture   Final    NO SALMONELLA, SHIGELLA, CAMPYLOBACTER, YERSINIA, OR E.COLI 0157:H7 ISOLATED Performed at [facility]    Report Status 05/08/2015 FINAL  Final  C difficile quick scan w PCR reflex     Status: Abnormal   Collection Time: 05/05/15 11:40 AM  Result Value Ref Range Status   C Diff antigen POSITIVE (A) NEGATIVE Final   C Diff toxin NEGATIVE NEGATIVE Final   C Diff interpretation   Final    C. difficile present, but toxin not detected. This indicates colonization. In most cases, this does not require treatment. If patient has signs and symptoms consistent with colitis, consider treatment. Requires ENTERIC precautions.  Urine culture     Status: None   Collection Time: 05/05/15 11:40 AM  Result Value Ref Range Status   Specimen Description URINE, CLEAN CATCH  Final   Special Requests NONE  Final   Culture   Final    NO GROWTH 1 DAY Performed at [facility]    Report Status 05/06/2015 FINAL  Final  Culture, blood (routine x 2)     Status: None (Preliminary result)   Collection Time: 05/05/15  4:30 PM  Result Value Ref Range Status   Specimen Description BLOOD PORT  10 ML IN [facility] BOTTLE  Final   Special Requests NONE  Final   Culture   Final    NO GROWTH 3 DAYS Performed at [facility]    Report Status PENDING  Incomplete  Culture, blood (routine x 2)     Status: None (Preliminary result)   Collection Time: 05/05/15  4:52 PM  Result Value Ref Range Status   Specimen Description BLOOD LEFT ARM  10 ML IN [facility] BOTTLE  Final   Special Requests NONE  Final   Culture   Final    NO GROWTH 3 DAYS Performed at [facility]    Report Status PENDING  Incomplete  Studies: No results found.  Scheduled Meds: . acyclovir  400 mg Oral Daily  . antiseptic oral rinse  7 mL Mouth Rinse q12n4p  . chlorhexidine  15 mL Mouth Rinse BID  . feeding supplement (ENSURE ENLIVE)  237 mL Oral BID BM  . levothyroxine  75 mcg Oral Daily  . loratadine  10 mg Oral Daily  . metoCLOPramide (REGLAN) injection  5 mg Intravenous 4 times per day  . metroNIDAZOLE  500 mg Oral 3 times per day  . OLANZapine  5 mg Oral QHS  . saccharomyces boulardii  250 mg Oral BID  . sodium chloride  3 mL Intravenous Q12H   Continuous Infusions: . sodium chloride 75 mL/hr (05/08/15 1445)    Principal Problem:   Acute renal failure (ARF) (HCC) Active Problems:   Mantle cell lymphoma (HCC)   Thrombocytopenia (HCC)   Essential hypertension   Fever   Hypokalemia   Dehydration   SIRS (systemic inflammatory response syndrome) (HCC)   QT prolongation   Absolute anemia   Weakness    Time spent: 30 minutes.     Barton Dubois  [facility] Pager [phone] If 7PM-7AM, please contact night-coverage at [email or web address], password [facility] 05/08/2015, 7:03 PM  LOS: 3 days

## 2015-05-08 NOTE — Progress Notes (Signed)
Spoke with pt who referred me to daughter Brennan Bailey. Brennan Bailey was called [phone], who selected [facility]. Information faxed to [phone].

## 2015-05-08 NOTE — Progress Notes (Addendum)
Physical Therapy Treatment Patient Details Name: Manuel [name] MRN: [id number] DOB: Nov 10, 1940 Today's Date: 05/08/2015    History of Present Illness 74 y.o. malepast medical history of Thyroid disease, Hyperlipemia, Liver disease, Malnutrition,Hypothyroidism, Depression, Gout , Essential hypertension, mantel cell lymphoma admitted for acute renal failure, fever/SIRS    PT Comments    Pt progressing with mobility. He reports L knee pain has resolved. He walked 67' with RW, distance limited by SOB/fatigue.   Follow Up Recommendations  Supervision - Intermittent;Home health PT (if pt wishes to pursue)     Equipment Recommendations  None recommended by PT    Recommendations for Other Services       Precautions / Restrictions Precautions Precautions: Fall Restrictions Weight Bearing Restrictions: No    Mobility  Bed Mobility Overal bed mobility: Modified Independent             General bed mobility comments: with bedrail, HOB up 30*  Transfers Overall transfer level: Needs assistance Equipment used: Rolling walker (2 wheeled)   Sit to Stand: Min guard Stand pivot transfers: Min guard       General transfer comment: min/guard for safety, verbal cues for hand placement  Ambulation/Gait Ambulation/Gait assistance: Min guard Ambulation Distance (Feet): 90 Feet Assistive device: Rolling walker (2 wheeled) Gait Pattern/deviations: Step-through pattern;Decreased stride length   Gait velocity interpretation: Below normal speed for age/gender General Gait Details: steady with RW, 2/4 dyspnea, HR 106 with walking, distance limited by fatigue and SOB, one standing rest break due to fatigue   Stairs            Wheelchair Mobility    Modified Rankin (Stroke Patients Only)       Balance Overall balance assessment: Modified Independent                                  Cognition Arousal/Alertness: Awake/alert Behavior During Therapy: WFL  for tasks assessed/performed Overall Cognitive Status: Within Functional Limits for tasks assessed                      Exercises      General Comments        Pertinent Vitals/Pain Pain Assessment: No/denies pain    Home Living                      Prior Function            PT Goals (current goals can now be found in the care plan section) Acute Rehab PT Goals PT Goal Formulation: With patient Time For Goal Achievement: 05/13/15 Potential to Achieve Goals: Good Progress towards PT goals: Progressing toward goals    Frequency  Min 3X/week    PT Plan Current plan remains appropriate    Co-evaluation             End of Session Equipment Utilized During Treatment: Gait belt Activity Tolerance: Patient tolerated treatment well;Patient limited by fatigue Patient left: in chair;with chair alarm set;with call bell/phone within reach     Time: 1340-1402 PT Time Calculation (min) (ACUTE ONLY): 22 min  Charges:  $Gait Training: 8-22 mins                    G Codes:      Blondell Reveal Kistler 05/08/2015, 2:10 PM [phone]

## 2015-05-08 NOTE — Progress Notes (Signed)
Manuel [name]   DOB:[birth year]   XT#:[id number]    Subjective: He feels terrible. He had one loose stool this morning. Has no appetite. Denies pain. When I ask him why he does not feel good, he replied "I don't know". He is unsure about everything. He does not know if hospice is a good idea. He wants to know about his prognosis without treatment.  Objective:  Filed Vitals:   05/08/15 0546  BP: 124/51  Pulse: 75  Temp: 98.2 F (36.8 C)  Resp: 18     Intake/Output Summary (Last 24 hours) at 05/08/15 1157 Last data filed at 05/07/15 2300  Gross per 24 hour  Intake   1800 ml  Output      0 ml  Net   1800 ml    GENERAL:alert, no distress and comfortable SKIN: skin color is pale, texture, turgor are normal, no rashes or significant lesions EYES: normal, Conjunctiva are pale and non-injected, sclera clear ABDOMEN:abdomen soft, non-tender and normal bowel sounds. Palpable splenomegaly Musculoskeletal:no cyanosis of digits and no clubbing  NEURO: alert & oriented x 3 with fluent speech, no focal motor/sensory deficits   Labs:  Lab Results  Component Value Date   WBC 6.8 05/08/2015   HGB 7.0* 05/08/2015   HCT 21.2* 05/08/2015   MCV 91.4 05/08/2015   PLT 39* 05/08/2015   NEUTROABS 0.6* 05/05/2015    Lab Results  Component Value Date   NA 142 05/08/2015   K 3.5 05/08/2015   CL 116* 05/08/2015   CO2 19* 05/08/2015  Assessment & Plan:   Recurrent mantle cell lymphoma Imaging study and examination revealed palpable splenomegaly, consistent with disease relapse. I have discontinued Revlimid. The patient is made aware multiple times that his prognosis is poor. I review his case at the hematology tumor board today and treatment options are really limited. I spoke with the patient again for consideration for palliative care and hospice. After much discussion, he is willing to stop treatment. I reinforced to the patient again that I would not recommend chemotherapy.  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 The patient denies recent history of bleeding such as epistaxis, hematuria or hematochezia. He is asymptomatic from the anemia. I will observe for now.  Consider transfusion if hemoglobin drops further  Thrombocytopenia and splenomegaly, due to recurrence of mantle cell lymphoma This is due to disease. Discontinue pharmacology DVT prophylaxis with blood count less than 50,000 due to risk of bleeding  Diarrhea, C. difficile toxin negative, C. difficile antigen positive, likely colonization Consider stopping antibiotics treatment if cultures were negative  Sometimes, recurrent lymphoma can involve the colon and that could cause diarrhea as well  Prerenal acute renal failure, improving Continue IV fluid hydration  Hypokalemia likely secondary to poor oral intake, diarrhea and antibiotics side effects He is on potassium replacement therapy  Protein calorie malnutrition I appreciate nutritional evaluation and consult.  Discharge planning Possibly discharge to home hospice over the next 24-48 hours  Left shoulder pain This is due to referred pain from splenomegaly. Continue oxycodone as needed.   Palliative care encounter I discussed CODE STATUS with the patient. He is in agreement to change code status to DO NOT RESUSCITATE/DO NOT INTUBATE Daughter is the MPOA He has seen palliative care service. Arrangement is being made for him to go home to live with his daughter with home hospice care. I discussed prognosis with him and his daughter. With his progressive decline, I estimated survival would  be in a few weeks, probably less than 2 months.  [place], [place], MD 05/08/2015  11:57 AM

## 2015-05-09 DIAGNOSIS — R5381 Other malaise: Secondary | ICD-10-CM

## 2015-05-09 DIAGNOSIS — C8319 Mantle cell lymphoma, extranodal and solid organ sites: Secondary | ICD-10-CM

## 2015-05-09 DIAGNOSIS — E440 Moderate protein-calorie malnutrition: Secondary | ICD-10-CM

## 2015-05-09 DIAGNOSIS — I10 Essential (primary) hypertension: Secondary | ICD-10-CM

## 2015-05-09 DIAGNOSIS — R5383 Other fatigue: Secondary | ICD-10-CM

## 2015-05-09 MED ORDER — SACCHAROMYCES BOULARDII 250 MG PO CAPS: 250.0000 mg | ORAL_CAPSULE | Freq: Two times a day (BID) | ORAL | Status: AC

## 2015-05-09 MED ORDER — OLANZAPINE 5 MG PO TABS: 5.0000 mg | ORAL_TABLET | Freq: Every day | ORAL | Status: AC

## 2015-05-09 MED ORDER — HEPARIN SOD (PORK) LOCK FLUSH 100 UNIT/ML IV SOLN
500.0000 [IU] | INTRAVENOUS | Status: AC | PRN
  Administered 2015-05-09: 500 [IU]

## 2015-05-09 MED ORDER — METRONIDAZOLE 500 MG PO TABS: 500.0000 mg | ORAL_TABLET | Freq: Three times a day (TID) | ORAL | Status: AC

## 2015-05-09 MED ORDER — OXYCODONE HCL 5 MG PO TABS: 5.0000 mg | ORAL_TABLET | ORAL | Status: AC | PRN

## 2015-05-09 MED ORDER — METOCLOPRAMIDE HCL 5 MG PO TABS: 5.0000 mg | ORAL_TABLET | Freq: Three times a day (TID) | ORAL | Status: AC | PRN

## 2015-05-09 MED ORDER — ENSURE ENLIVE PO LIQD: 237.0000 mL | Freq: Two times a day (BID) | ORAL | Status: AC

## 2015-05-09 NOTE — Discharge Summary (Addendum)
Physician Discharge Summary  Manuel [name] SNK:[id number] DOB: [birth year] DOA: 05/04/2015  PCP: Pcp Not In System  Admit date: 05/04/2015 Discharge date: 05/09/2015  Time spent: >30 minutes  Recommendations for Outpatient Follow-up:  1. Symptomatic management and comfort care with home hospice   Discharge Diagnoses:  Principal Problem:   Acute renal failure (ARF) (HCC) Active Problems:   Mantle cell lymphoma (HCC)   Thrombocytopenia (HCC)   Essential hypertension   Fever   Hypokalemia   Dehydration   SIRS (systemic inflammatory response syndrome) (HCC)   QT prolongation   Absolute anemia   Weakness   Diarrhea Moderate protein calorie malnutrition   Discharge Condition: vital signs stable. Overall in no acute distress. Will discharge home with hospice care.  Diet recommendation: comfort feeding.   Filed Weights   05/05/15 0206 05/05/15 0805  Weight: 89.7 kg (197 lb 12 oz) 91.1 kg (200 lb 13.4 oz)    History of present illness:  74 y.o. male past medical history of Thyroid disease; Hyperlipemia; Liver disease; Malnutrition, Hypothyroidism; Depression; Gout , Essential hypertension, mantel cell lymphoma status post 2R-EPOCH, In August he was found to be in complete remission and was started on rituximab every 60 days and we will addition to Revlimid. 5 days ago patient tripped and fell. He had injured his right wrist and left knee. Patietn have been treated with Revlemid fot his lymphoma this was discontinued this week because Patient have had significant diarrhea and felt that given recurrence of disease outcome was too poor and treatment will just make him sicker. [facility] was called to admit patient for supportive care and Palliative care consulted for Hospice.  Hospital Course:  Acute renal failure: related to hemodynamic, hypovolemia.  continue with IV fluids. Renal function improving.  Cr peak to 1.8. It has decreased to 1.14 with IVF's and supportive care Advise to  maintain himself well hydrated  Fever/ SIRS; resolved, unclear etiology  C diff antigen positive. Discussed with ID , will treat empirically to see if diarrhea improved with flagyl (for a total of 10 days; at discharge on day 3/10) and florastor.  Influenza negative.  Chest x ray negative. urine culture no growth to date  Body fluid culture from left knee no growth to date.  Blood culture no growth to date.  Stopped IV zosyn and vancomycin 10-03  Anemia, pancytopenia;  Recent immunosuppressive therapy No further Transfusion per oncologist.  Plan is for hospice care  Mantle cell lymphoma ; with recurrence  Pancytopenia; might be related to Revlimid, which has been discontinued.  Dr Lucas Mallow following; plan is for hospice care .  Appreciate Palliative care team consultation   Hypokalemia: repleted -given plan of comfort care and hospice at home no further blood work drawn while inpatient. -last checked was 3.5  knee effusion: suspect related to recent trauma. On PE no redness notice. culture: no organism present, synovial: negative for crystal, WBC 2444, neutrophils 80.  -no need for antibiotics   Prolonged QTC ; Mg was repleted   Plan is for comfort care and hospice at home   Elevated D dimer: doppler negative. CT angio negative for PE.   Poor appetite and insomnia: started on zyprexa  Nausea/Pain: as per palliative care intervention/recommendation will use reglan and oxycodone PRN  Moderate protein calorie malnutrition -will encourage PO intake -initiated on zyprexa -will follow rec's from nutritional service for feeding supplements  Procedures:  See below for x-ray reports   Consultations:  Oncology  Palliative care   Discharge Exam:  Filed Vitals:   05/09/15 0520  BP: 132/58  Pulse: 83  Temp: 98.3 F (36.8 C)  Resp: 18    General: Alert in no distress; denies abd pain and reported diarrhea continue improving. Still with very poor appetite    Cardiovascular: S 1, S 2 RRR  Respiratory: CTA  Abdomen: BS present, soft,.   Musculoskeletal: no edema  Discharge Instructions   Discharge Instructions    Discharge instructions    Complete by:  As directed   Take medications as prescribed Keep yourself hydrated and increase PO intake for better nutrition Follow instructions from hospice nurses and staff          Current Discharge Medication List    START taking these medications   Details  feeding supplement, ENSURE ENLIVE, (ENSURE ENLIVE) LIQD Take 237 mLs by mouth 2 (two) times daily between meals. Qty: 237 mL, Refills: 12    metoCLOPramide (REGLAN) 5 MG tablet Take 1 tablet (5 mg total) by mouth every 8 (eight) hours as needed for nausea or vomiting. Qty: 30 tablet, Refills: 1    metroNIDAZOLE (FLAGYL) 500 MG tablet Take 1 tablet (500 mg total) by mouth every 8 (eight) hours. Qty: 21 tablet, Refills: 0    OLANZapine (ZYPREXA) 5 MG tablet Take 1 tablet (5 mg total) by mouth at bedtime. Qty: 30 tablet, Refills: 0    saccharomyces boulardii (FLORASTOR) 250 MG capsule Take 1 capsule (250 mg total) by mouth 2 (two) times daily. Qty: 60 capsule, Refills: 0      CONTINUE these medications which have CHANGED   Details  oxyCODONE (ROXICODONE) 5 MG immediate release tablet Take 1 tablet (5 mg total) by mouth every 4 (four) hours as needed for severe pain. Qty: 30 tablet, Refills: 0      CONTINUE these medications which have NOT CHANGED   Details  acetaminophen (TYLENOL) 500 MG tablet Take 500 mg by mouth every 6 (six) hours as needed for moderate pain or fever.     acyclovir (ZOVIRAX) 400 MG tablet Take 1 tablet (400 mg total) by mouth daily. Qty: 30 tablet, Refills: 6    levothyroxine (SYNTHROID, LEVOTHROID) 75 MCG tablet Take 1 tablet (75 mcg total) by mouth daily. Qty: 30 tablet, Refills: 6    loratadine (CLARITIN) 10 MG tablet Take 10 mg by mouth every morning.    ondansetron (ZOFRAN) 8 MG tablet Take 1  tablet (8 mg total) by mouth 3 (three) times daily. Qty: 60 tablet, Refills: 3    prochlorperazine (COMPAZINE) 10 MG tablet Take 10 mg by mouth every 6 (six) hours as needed for nausea or vomiting.       STOP taking these medications     lenalidomide (REVLIMID) 15 MG capsule        No Known Allergies   The results of significant diagnostics from this hospitalization (including imaging, microbiology, ancillary and laboratory) are listed below for reference.    Significant Diagnostic Studies: Dg Chest 2 View  05/04/2015   CLINICAL DATA:  Generalize weakness and a fall after starting chemotherapy 5 weeks ago. Fall on Thursday.  EXAM: CHEST  2 VIEW  COMPARISON:  02/10/2015.  FINDINGS: Both lateral views are degraded by patient arm position. Right sided Port-A-Cath terminates at the mid SVC. Midline trachea. Normal heart size. Atherosclerosis in the transverse aorta. No pleural effusion or pneumothorax. No congestive failure. Clear lungs.  IMPRESSION: No acute or posttraumatic deformity.  Aortic  atherosclerosis.   Electronically Signed   By: Marylyn Ishihara  Jobe Igo M.D.   On: 05/04/2015 21:25   Dg Wrist Complete Right  05/04/2015   CLINICAL DATA:  Acute onset of generalized weakness. Status post fall, with right wrist pain. Initial encounter.  EXAM: RIGHT WRIST - COMPLETE 3+ VIEW  COMPARISON:  None.  FINDINGS: There is no evidence of fracture or dislocation. The carpal rows are intact, and demonstrate normal alignment. The joint spaces are preserved. Negative ulnar variance is noted.  No significant soft tissue abnormalities are seen.  IMPRESSION: No evidence of fracture or dislocation.   Electronically Signed   By: Garald Balding M.D.   On: 05/04/2015 21:13   Ct Angio Chest Pe W/cm &/or Wo Cm  05/04/2015   CLINICAL DATA:  Positive D-dimer. Generalized weakness. Chemotherapy for lymphoma  EXAM: CT ANGIOGRAPHY CHEST WITH CONTRAST  TECHNIQUE: Multidetector CT imaging of the chest was performed using the  standard protocol during bolus administration of intravenous contrast. Multiplanar CT image reconstructions and MIPs were obtained to evaluate the vascular anatomy.  CONTRAST:  61mL OMNIPAQUE IOHEXOL 350 MG/ML SOLN  COMPARISON:  PET-CT 03/22/2015  FINDINGS: THORACIC INLET/BODY WALL:  Right IJ porta catheter remains in good position.  MEDIASTINUM:  Normal heart size. No pericardial effusion. No acute vascular abnormality, including evidence of pulmonary embolism or aortic dissection. Atherosclerosis, including the coronary arteries.  Since PET-CT, diffuse increase in mediastinal nodal size, including prevascular, upper right peritracheal, and subcarinal nodes. Subcarinal node is now 11 mm in short axis, previously 3 mm.  LUNG WINDOWS:  No consolidation.  No effusion.  UPPER ABDOMEN:  Visible portions of the spleen are clearly larger than on PET-CT. Additionally, on the second lower acquisition, there is new retroperitoneal adenopathy with periportal node measuring up to 3 cm short axis.  OSSEOUS:  No acute fracture. No suspicious lytic or blastic lesions. Stable endplate erosive changes in the lower thoracic spine.  Review of the MIP images confirms the above findings.  IMPRESSION: 1. Negative for pulmonary embolism or other acute finding. 2. Lymphoma with enlarging mediastinal and upper abdominal lymph nodes since PET-CT 03/22/2015. Please arrange follow-up with patient's oncologist.   Electronically Signed   By: Monte Fantasia M.D.   On: 05/04/2015 23:04   Dg Knee Complete 4 Views Left  05/04/2015   CLINICAL DATA:  Acute onset of generalized weakness. Status post fall, with left knee pain. Initial encounter.  EXAM: LEFT KNEE - COMPLETE 4+ VIEW  COMPARISON:  None.  FINDINGS: There is no evidence of fracture or dislocation. The joint spaces are preserved. No significant degenerative change is seen; the patellofemoral joint is grossly unremarkable in appearance.  A moderate knee joint effusion is noted.  Chondrocalcinosis is noted. An apparent soft tissue calcification is noted medially and posteriorly at the level of the distal femur.  IMPRESSION: 1. No evidence of fracture or dislocation. 2. Moderate knee joint effusion noted. 3. Chondrocalcinosis noted.   Electronically Signed   By: Garald Balding M.D.   On: 05/04/2015 21:15    Microbiology: Recent Results (from the past 240 hour(s))  Body fluid culture     Status: None   Collection Time: 05/05/15 12:31 AM  Result Value Ref Range Status   Specimen Description FLUID LEFT KNEE  Final   Special Requests Normal  Final   Gram Stain   Final    NO ORGANISMS SEEN WBC PRESENT, PREDOMINANTLY PMN CYTOSPIN Gram Stain Report Called to,Read Back By and Verified With: FELICIA WILFONG,RN [id number] @ 0300 BY J SCOTTON    Culture  Final    NO GROWTH 3 DAYS Performed at [facility]    Report Status 05/08/2015 FINAL  Final  Stool culture     Status: None   Collection Time: 05/05/15 11:40 AM  Result Value Ref Range Status   Specimen Description URINE, CLEAN CATCH  Final   Special Requests NONE  Final   Culture   Final    NO SALMONELLA, SHIGELLA, CAMPYLOBACTER, YERSINIA, OR E.COLI 0157:H7 ISOLATED Performed at [facility]    Report Status 05/08/2015 FINAL  Final  C difficile quick scan w PCR reflex     Status: Abnormal   Collection Time: 05/05/15 11:40 AM  Result Value Ref Range Status   C Diff antigen POSITIVE (A) NEGATIVE Final   C Diff toxin NEGATIVE NEGATIVE Final   C Diff interpretation   Final    C. difficile present, but toxin not detected. This indicates colonization. In most cases, this does not require treatment. If patient has signs and symptoms consistent with colitis, consider treatment. Requires ENTERIC precautions.  Urine culture     Status: None   Collection Time: 05/05/15 11:40 AM  Result Value Ref Range Status   Specimen Description URINE, CLEAN CATCH  Final   Special Requests NONE  Final   Culture   Final     NO GROWTH 1 DAY Performed at [facility]    Report Status 05/06/2015 FINAL  Final  Culture, blood (routine x 2)     Status: None (Preliminary result)   Collection Time: 05/05/15  4:30 PM  Result Value Ref Range Status   Specimen Description BLOOD PORT  10 ML IN [facility] BOTTLE  Final   Special Requests NONE  Final   Culture   Final    NO GROWTH 4 DAYS Performed at [facility]    Report Status PENDING  Incomplete  Culture, blood (routine x 2)     Status: None (Preliminary result)   Collection Time: 05/05/15  4:52 PM  Result Value Ref Range Status   Specimen Description BLOOD LEFT ARM  10 ML IN [facility] BOTTLE  Final   Special Requests NONE  Final   Culture   Final    NO GROWTH 4 DAYS Performed at [facility]    Report Status PENDING  Incomplete     Labs: Basic Metabolic Panel:  Recent Labs Lab 05/04/15 1945 05/05/15 0930 05/06/15 0301 05/07/15 0406 05/08/15 0405  NA 136 135 137 141 142  K 2.8* 3.0* 2.9* 3.1* 3.5  CL 105 110 110 114* 116*  CO2 19* 20* 20* 20* 19*  GLUCOSE 107* 103* 90 95 84  BUN 23* 20 17 12 10   CREATININE 1.83* 1.65* 1.46* 1.22 1.14  CALCIUM 8.2* 7.5* 7.7* 7.7* 8.0*  MG  --  1.4*  --  1.6* 1.8  PHOS  --  3.0  --   --   --    Liver Function Tests:  Recent Labs Lab 05/05/15 0930  AST 25  ALT 25  ALKPHOS 60  BILITOT 1.4*  PROT 5.0*  ALBUMIN 2.6*   CBC:  Recent Labs Lab 05/04/15 1945 05/05/15 0930 05/06/15 0301 05/07/15 0406 05/08/15 0405  WBC 4.1 3.4* 4.6 5.8 6.8  NEUTROABS 1.5* 0.6*  --   --   --   HGB 8.1* 8.2* 7.5* 7.3* 7.0*  HCT 24.4* 24.5* 22.8* 22.2* 21.2*  MCV 88.7 90.4 89.8 91.4 91.4  PLT 60* 55* 46* 45* 39*    Signed:  Dyann Kief,  Amelio Brosky  [facility] 05/09/2015, 1:15 PM    .

## 2015-05-09 NOTE — Progress Notes (Signed)
This CM spoke with Beth from [facility] to ensure pt would be seen today at home. Pt to be transported home via daughter's car. Narcotic prescriptions faxed to [facility] to dispense. No other DC needs noted. [place] Doctor RN,BSN,NCM [phone]

## 2015-05-10 DIAGNOSIS — E440 Moderate protein-calorie malnutrition: Secondary | ICD-10-CM | POA: Insufficient documentation

## 2015-05-10 LAB — CULTURE, BLOOD (ROUTINE X 2)
CULTURE: NO GROWTH
CULTURE: NO GROWTH

## 2015-05-15 ENCOUNTER — Encounter: Payer: Self-pay | Admitting: *Deleted

## 2015-05-15 NOTE — Progress Notes (Signed)
Yellow DNR form received from [facility].  It was signed by Dr. Alvy Bimler and placed in outgoing mail back to Hospice.

## 2015-05-20 ENCOUNTER — Other Ambulatory Visit: Payer: Medicare PPO

## 2015-05-20 ENCOUNTER — Ambulatory Visit: Payer: Medicare PPO

## 2015-05-20 ENCOUNTER — Ambulatory Visit: Payer: Medicare PPO | Admitting: Hematology and Oncology

## 2015-05-27 ENCOUNTER — Telehealth: Payer: Self-pay | Admitting: Hematology and Oncology

## 2015-05-27 NOTE — Telephone Encounter (Signed)
Faxed over records to [facility]

## 2015-05-28 ENCOUNTER — Encounter: Payer: Self-pay | Admitting: *Deleted

## 2015-05-28 NOTE — Progress Notes (Signed)
Mailed copy of FMLA papers to [place], daughter. Copy sent to be scanned into chart and another copy faxed to [employer].

## 2015-06-18 ENCOUNTER — Other Ambulatory Visit: Payer: Self-pay | Admitting: Hematology and Oncology

## 2015-07-04 DEATH — deceased

## 2015-07-10 IMAGING — CT CT ABD-PELV W/ CM
2 of 8 series · 15 of 46 positions shown, 17 images · IV contrast (OMNIPAQUE)
Comparison: None.

01/24/2014, 07/03/2014

CLINICAL DATA: Staging metastatic lymphoma prominent current
treatment

EXAM:
CT CHEST, ABDOMEN, AND PELVIS WITH CONTRAST
TECHNIQUE: Multidetector CT imaging of the chest, abdomen and pelvis was
performed following the standard protocol during bolus
administration of intravenous contrast.
CONTRAST:  100mL OMNIPAQUE IOHEXOL 300 MG/ML  SOLN

[Series 2: cap st · axial · 0.77mm/px · z∈[-763,-158]mm · 12 of 143 slices shown, 14 images]
[im 11/143  soft-tissue]
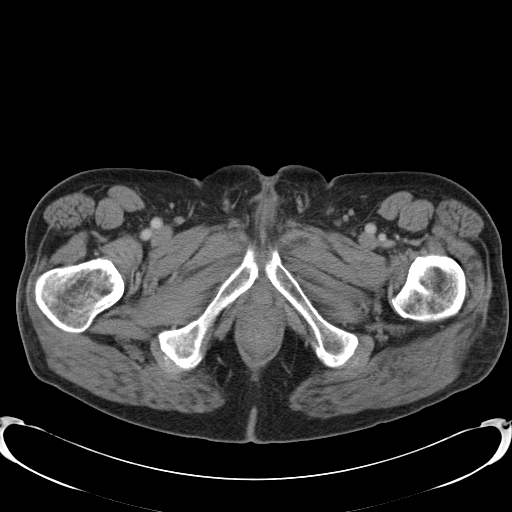
[im 11/143  bone]
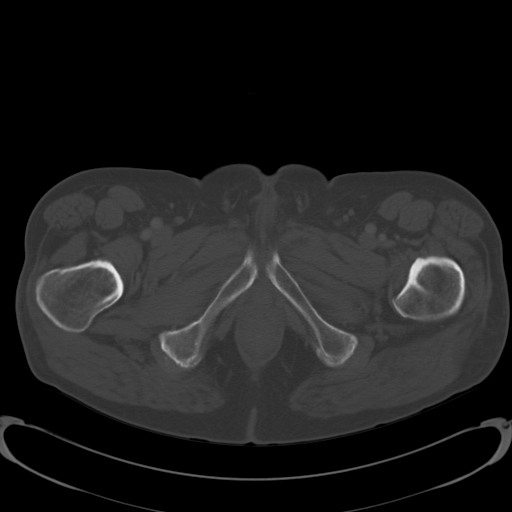
[im 22/143  soft-tissue]
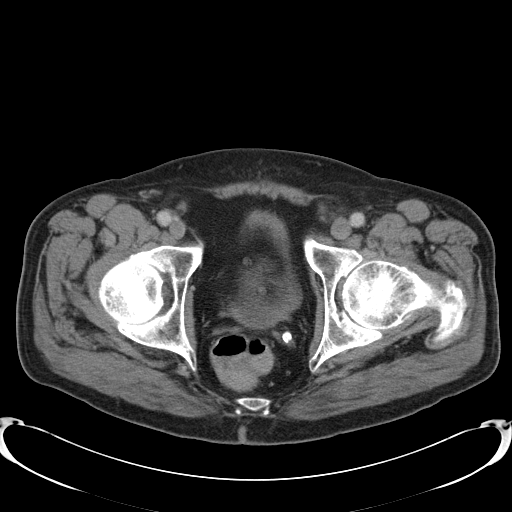
[im 33/143  soft-tissue]
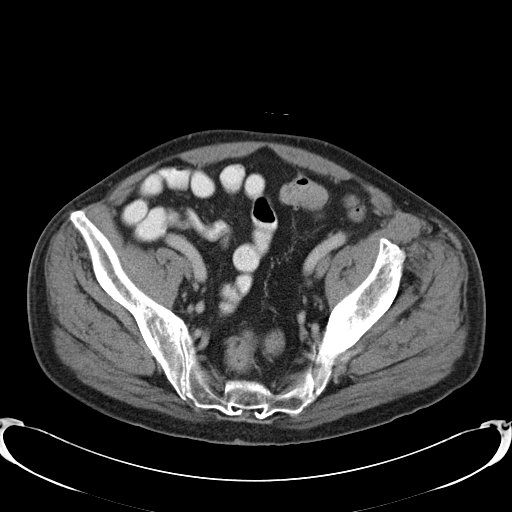
[im 44/143  soft-tissue]
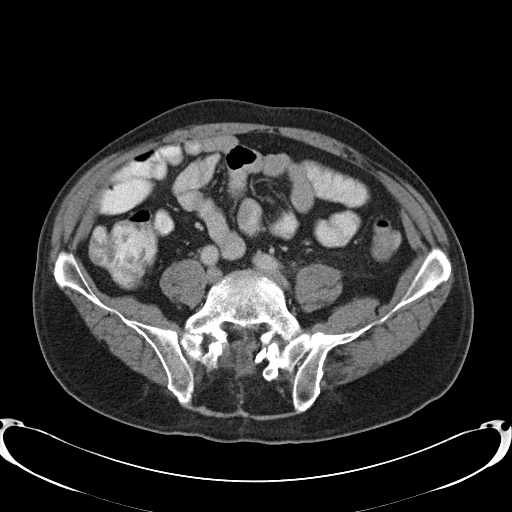
[im 55/143  soft-tissue]
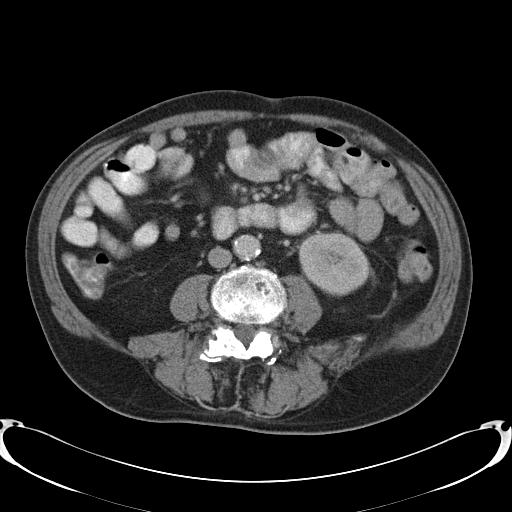
[im 66/143  soft-tissue]
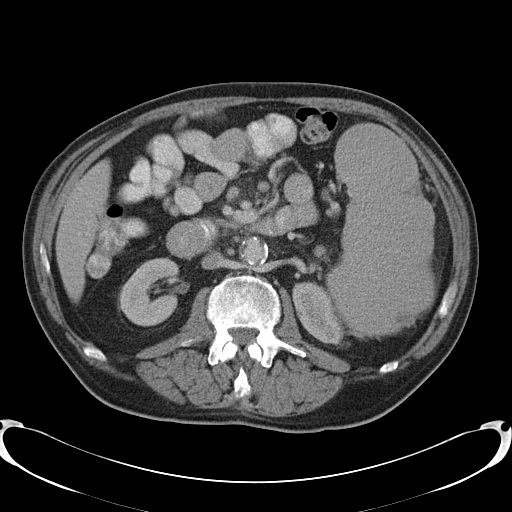
[im 77/143  soft-tissue]
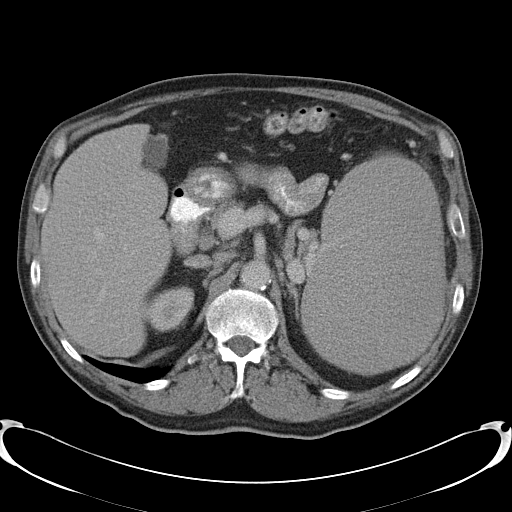
[im 88/143  soft-tissue]
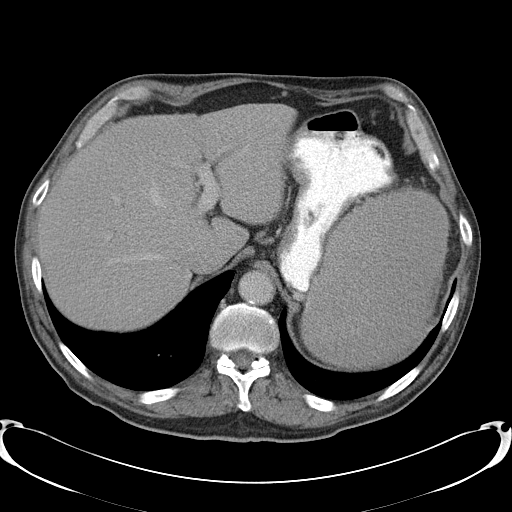
[im 99/143  soft-tissue]
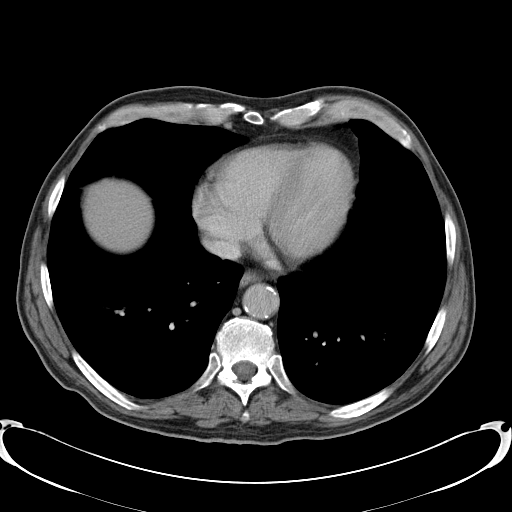
[im 99/143  bone]
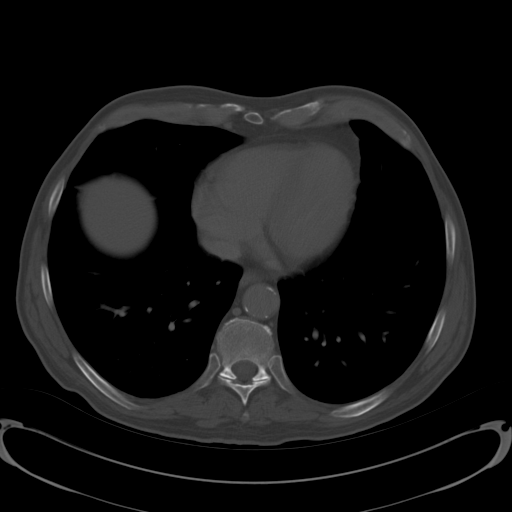
[im 110/143  soft-tissue]
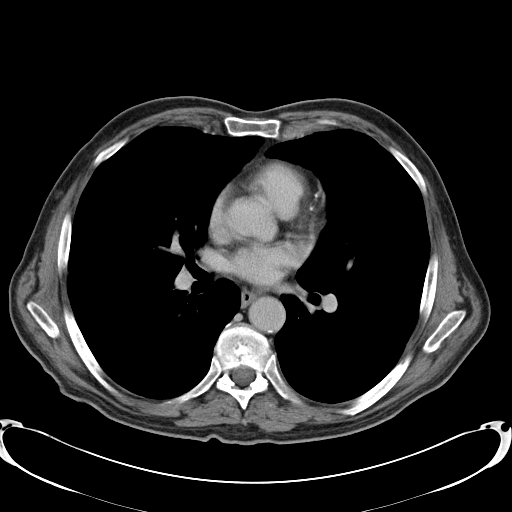
[im 121/143  soft-tissue]
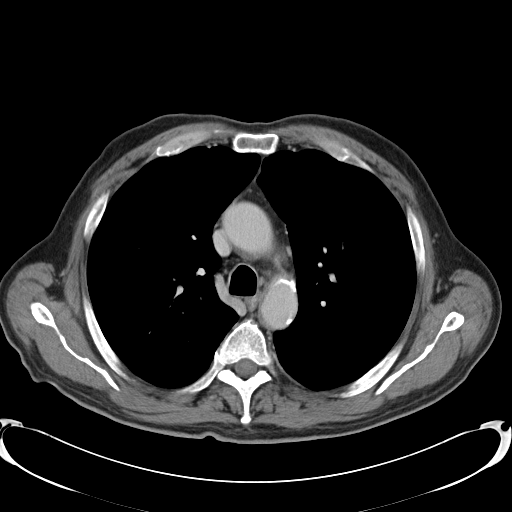
[im 132/143  soft-tissue]
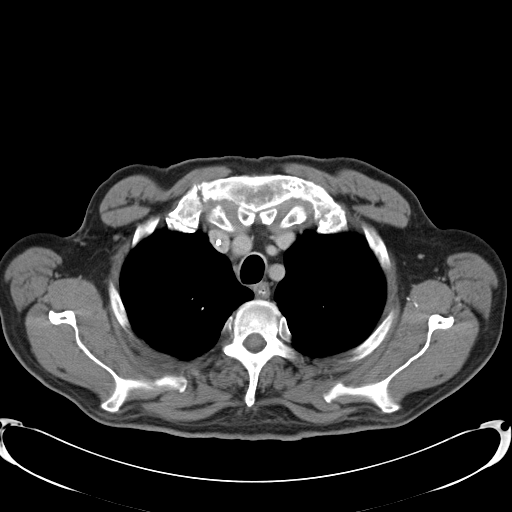

[Series 604: corona · coronal · 1.40mm/px · 3 of 91 slices shown]
[im 19/91  soft-tissue]
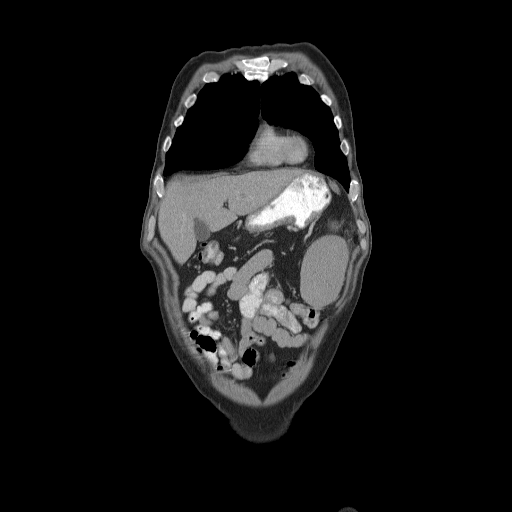
[im 37/91  soft-tissue]
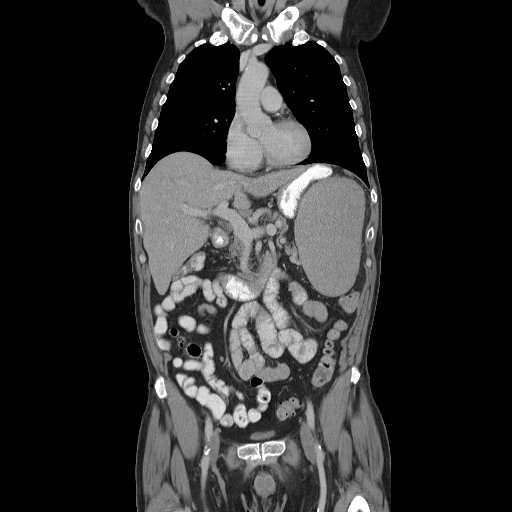
[im 55/91  soft-tissue]
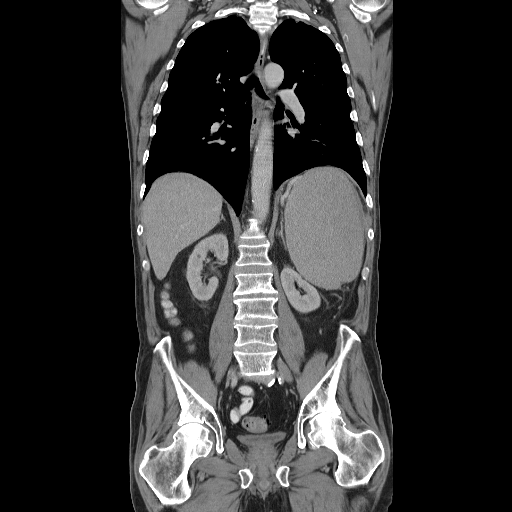

[15 of 46 positions shown; findings below may reference images not displayed]

FINDINGS: CT CHEST FINDINGS

Ppm 3 mm pulmonary nodule series 7, image number 24 located in the
posterior segment of the right upper of. Right lung is otherwise
clear. Left lung is clear.

Thoracic inlet is normal. Thyroid is normal. Right Port-A-Cath is
identified with tip in the distal superior vena cava. Numerous tiny
axillary lymph nodes with no enlarged axillary or supraclavicular or
infraclavicular adenopathy. No significant hilar adenopathy.
Pretracheal lymph nodes measure maximum diameter of 5 mm. Sub
carinal lymph node measures 7 x 18 mm. An adjacent subcarinal lymph
node just to the right of this measures 6 x 14 mm.

Heart size is normal. No significant pericardial effusion. Mild
coronary artery calcification. Mild calcification to moderate
calcification of the aortic arch and descending thoracic aorta. No
pleural effusion. It is

CT ABDOMEN AND PELVIS FINDINGS

Liver is normal. Gallbladder demonstrates a punctate calcification.
It is is likely a tiny stone in the neck of the gallbladder. No
surrounding inflammatory change. The spleen is markedly enlarged
with as span of 17 cm. In the posterior peripheral spleen there is a
subcapsular low-attenuation lesion measuring 18 x 23 mm.

Pancreas is normal. New adrenal glands are normal. Kidneys are
normal except for mild bilateral perinephric inflammatory change
which appears stable.

Moderate calcification of the abdominal aorta. Bladder is normal.
Reproductive organs are normal.

There is diverticulosis of moderate severity involving the sigmoid
colon. No evidence of diverticulitis. Appendix is normal. Small
bowel is normal. Stomach is normal, except for mass effect exerted
on the the stomach by the spleen.

There are numerous mesenteric lymph nodes throughout the abdomen. At
the root of the mesentery in the midline of the mid abdomen, lymph
nodes measure up to about 8 mm in diameter. There are also celiac
axis lymph nodes measuring up to about 7 mm. There is an aortocaval
lymph node at the level of the upper pole the right kidney image
number 67 measuring 8 mm in short axis. This is the largest
retroperitoneal lymph nodes seen in the abdomen with numerous
smaller periaortic and aortocaval lymph nodes. There is no
significant adenopathy in the pelvis or in either inguinal region.

There is no acute skeletal abnormality.
IMPRESSION: 1. Since 07/13/2014 there has been a significant increase in the
size and volume of the spleen. There has also been the development
of a low-attenuation lesion in the posterior subcapsular spleen.
This may have been present previously but is more prominently seen
and at least mildly larger on the current study. The appearance of
the spleen is concerning for lymphomatous involvement. The focal
lesion could potentially represent a small splenic infarct.
2. Since 07/13/2014 there appears to be an increase in the number of
mesenteric lymph nodes, primarily seen in the midline of the mid
abdomen. There also is a slight increase in the number and size of
retroperitoneal lymph nodes.
3. Since 07/13/2014, there are enlarged subcarinal lymph nodes.
4. Tiny nonspecific 3 mm pulmonary nodule on the right.
